# Patient Record
Sex: Female | Born: 1970 | Race: White | Hispanic: No | Marital: Single | Smoking: Current every day smoker
Health system: Southern US, Community
[De-identification: ages and names within clinical notes are randomized; demographics above are authoritative.]

## PROBLEM LIST (undated history)

## (undated) DIAGNOSIS — R569 Unspecified convulsions: Secondary | ICD-10-CM

## (undated) DIAGNOSIS — F419 Anxiety disorder, unspecified: Secondary | ICD-10-CM

## (undated) DIAGNOSIS — N809 Endometriosis, unspecified: Secondary | ICD-10-CM

## (undated) DIAGNOSIS — J449 Chronic obstructive pulmonary disease, unspecified: Secondary | ICD-10-CM

## (undated) DIAGNOSIS — R0600 Dyspnea, unspecified: Secondary | ICD-10-CM

## (undated) HISTORY — PX: CHOLECYSTECTOMY: SHX55

---

## 2004-09-29 ENCOUNTER — Emergency Department: Payer: Self-pay | Admitting: Internal Medicine

## 2004-10-02 ENCOUNTER — Emergency Department (HOSPITAL_COMMUNITY): Admission: EM | Admit: 2004-10-02 | Discharge: 2004-10-03 | Payer: Self-pay | Admitting: Emergency Medicine

## 2004-10-04 ENCOUNTER — Inpatient Hospital Stay (HOSPITAL_COMMUNITY): Admission: EM | Admit: 2004-10-04 | Discharge: 2004-10-06 | Payer: Self-pay | Admitting: Emergency Medicine

## 2004-10-11 ENCOUNTER — Emergency Department: Payer: Self-pay | Admitting: Emergency Medicine

## 2007-08-26 ENCOUNTER — Emergency Department (HOSPITAL_COMMUNITY): Admission: EM | Admit: 2007-08-26 | Discharge: 2007-08-26 | Payer: Self-pay | Admitting: Emergency Medicine

## 2007-08-30 ENCOUNTER — Emergency Department: Payer: Self-pay | Admitting: Emergency Medicine

## 2007-08-31 ENCOUNTER — Emergency Department (HOSPITAL_COMMUNITY): Admission: EM | Admit: 2007-08-31 | Discharge: 2007-08-31 | Payer: Self-pay | Admitting: Emergency Medicine

## 2007-09-01 ENCOUNTER — Encounter (INDEPENDENT_AMBULATORY_CARE_PROVIDER_SITE_OTHER): Payer: Self-pay | Admitting: General Surgery

## 2007-09-01 ENCOUNTER — Inpatient Hospital Stay (HOSPITAL_COMMUNITY): Admission: EM | Admit: 2007-09-01 | Discharge: 2007-09-02 | Payer: Self-pay | Admitting: Emergency Medicine

## 2008-04-11 ENCOUNTER — Emergency Department (HOSPITAL_COMMUNITY): Admission: EM | Admit: 2008-04-11 | Discharge: 2008-04-11 | Payer: Self-pay | Admitting: Emergency Medicine

## 2008-06-28 ENCOUNTER — Emergency Department (HOSPITAL_COMMUNITY): Admission: EM | Admit: 2008-06-28 | Discharge: 2008-06-28 | Payer: Self-pay | Admitting: Emergency Medicine

## 2008-07-04 ENCOUNTER — Emergency Department (HOSPITAL_COMMUNITY): Admission: EM | Admit: 2008-07-04 | Discharge: 2008-07-04 | Payer: Self-pay | Admitting: Emergency Medicine

## 2008-11-30 ENCOUNTER — Emergency Department (HOSPITAL_COMMUNITY): Admission: EM | Admit: 2008-11-30 | Discharge: 2008-12-01 | Payer: Self-pay | Admitting: Emergency Medicine

## 2009-04-18 ENCOUNTER — Emergency Department (HOSPITAL_COMMUNITY): Admission: EM | Admit: 2009-04-18 | Discharge: 2009-04-18 | Payer: Self-pay | Admitting: Emergency Medicine

## 2009-09-27 ENCOUNTER — Emergency Department (HOSPITAL_COMMUNITY): Admission: EM | Admit: 2009-09-27 | Discharge: 2009-09-27 | Payer: Self-pay | Admitting: Emergency Medicine

## 2009-11-05 ENCOUNTER — Inpatient Hospital Stay (HOSPITAL_COMMUNITY): Admission: AD | Admit: 2009-11-05 | Discharge: 2009-11-07 | Payer: Self-pay | Admitting: Obstetrics & Gynecology

## 2009-11-05 ENCOUNTER — Ambulatory Visit: Payer: Self-pay | Admitting: Obstetrics & Gynecology

## 2009-11-05 ENCOUNTER — Encounter: Payer: Self-pay | Admitting: Obstetrics & Gynecology

## 2010-03-09 ENCOUNTER — Encounter: Payer: Self-pay | Admitting: Emergency Medicine

## 2010-03-09 ENCOUNTER — Inpatient Hospital Stay (HOSPITAL_COMMUNITY): Admission: EM | Admit: 2010-03-09 | Discharge: 2010-03-11 | Payer: Self-pay | Admitting: General Surgery

## 2010-03-19 ENCOUNTER — Inpatient Hospital Stay (HOSPITAL_COMMUNITY): Admission: EM | Admit: 2010-03-19 | Discharge: 2010-03-23 | Payer: Self-pay | Admitting: Emergency Medicine

## 2010-03-19 ENCOUNTER — Ambulatory Visit: Payer: Self-pay | Admitting: Internal Medicine

## 2010-03-21 ENCOUNTER — Encounter: Payer: Self-pay | Admitting: Internal Medicine

## 2010-03-21 DIAGNOSIS — F200 Paranoid schizophrenia: Secondary | ICD-10-CM | POA: Insufficient documentation

## 2010-03-21 DIAGNOSIS — F411 Generalized anxiety disorder: Secondary | ICD-10-CM | POA: Insufficient documentation

## 2010-03-21 DIAGNOSIS — F319 Bipolar disorder, unspecified: Secondary | ICD-10-CM | POA: Insufficient documentation

## 2010-03-21 DIAGNOSIS — Z87898 Personal history of other specified conditions: Secondary | ICD-10-CM | POA: Insufficient documentation

## 2010-03-21 DIAGNOSIS — R569 Unspecified convulsions: Secondary | ICD-10-CM

## 2010-03-21 DIAGNOSIS — T148XXA Other injury of unspecified body region, initial encounter: Secondary | ICD-10-CM | POA: Insufficient documentation

## 2010-03-21 DIAGNOSIS — N12 Tubulo-interstitial nephritis, not specified as acute or chronic: Secondary | ICD-10-CM | POA: Insufficient documentation

## 2010-03-21 DIAGNOSIS — J45909 Unspecified asthma, uncomplicated: Secondary | ICD-10-CM | POA: Insufficient documentation

## 2010-03-27 ENCOUNTER — Ambulatory Visit: Payer: Self-pay | Admitting: Internal Medicine

## 2010-03-27 ENCOUNTER — Encounter: Payer: Self-pay | Admitting: Internal Medicine

## 2010-03-27 DIAGNOSIS — R110 Nausea: Secondary | ICD-10-CM | POA: Insufficient documentation

## 2010-03-31 ENCOUNTER — Telehealth: Payer: Self-pay | Admitting: Internal Medicine

## 2010-03-31 ENCOUNTER — Encounter: Payer: Self-pay | Admitting: Internal Medicine
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
## 2010-09-26 NOTE — Progress Notes (Signed)
Summary: phone/gg  Phone Note From Other Clinic   Summary of Call: call from Dionne with [facility]  [phone] reporting culture was positive for  c diff  : collection date  28th of July. Will fax report and scan in chart. Initial call taken by: Merrie Roof RN,  March 31, 2010 9:33 AM  Follow-up for Phone Call        Discussed with Dr. Rogelia Boga.  I will treat patient if she calls or come in complaining of diarrhea.  Her last office visit for hospital follow-up, patient had no complaints about diarrhea, only nausea. Follow-up by: Carrolyn Meiers, MD  Additional Follow-up for Phone Call Additional follow up Details #1::        Phone call completed
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
## 2010-09-26 NOTE — Miscellaneous (Signed)
Summary: PATIENT CONSENT FORM  PATIENT CONSENT FORM   Imported By: Louretta Parma 03/29/2010 15:56:52  _____________________________________________________________________  External Attachment:    Type:   Image     Comment:   External Document

## 2010-11-11 LAB — CARDIAC PANEL(CRET KIN+CKTOT+MB+TROPI)
CK, MB: 1.1 ng/mL (ref 0.3–4.0)
Relative Index: INVALID (ref 0.0–2.5)
Total CK: 28 U/L (ref 7–177)
Troponin I: 0.01 ng/mL (ref 0.00–0.06)
Troponin I: 0.01 ng/mL (ref 0.00–0.06)
Troponin I: 0.07 ng/mL — ABNORMAL HIGH (ref 0.00–0.06)

## 2010-11-11 LAB — CBC
HCT: 33.7 % — ABNORMAL LOW (ref 36.0–46.0)
HCT: 34.7 % — ABNORMAL LOW (ref 36.0–46.0)
HCT: 42 % (ref 36.0–46.0)
Hemoglobin: 11.9 g/dL — ABNORMAL LOW (ref 12.0–15.0)
Hemoglobin: 12.9 g/dL (ref 12.0–15.0)
Hemoglobin: 13.7 g/dL (ref 12.0–15.0)
Hemoglobin: 13.9 g/dL (ref 12.0–15.0)
Hemoglobin: 14.1 g/dL (ref 12.0–15.0)
MCH: 31.3 pg (ref 26.0–34.0)
MCH: 31.6 pg (ref 26.0–34.0)
MCH: 31.6 pg (ref 26.0–34.0)
MCH: 32.3 pg (ref 26.0–34.0)
MCHC: 32.9 g/dL (ref 30.0–36.0)
MCHC: 33.3 g/dL (ref 30.0–36.0)
MCHC: 33.5 g/dL (ref 30.0–36.0)
MCHC: 33.5 g/dL (ref 30.0–36.0)
MCHC: 34.3 g/dL (ref 30.0–36.0)
MCV: 94.1 fL (ref 78.0–100.0)
MCV: 94.9 fL (ref 78.0–100.0)
MCV: 95.4 fL (ref 78.0–100.0)
Platelets: 269 10*3/uL (ref 150–400)
Platelets: 327 10*3/uL (ref 150–400)
Platelets: 412 10*3/uL — ABNORMAL HIGH (ref 150–400)
RBC: 4.39 MIL/uL (ref 3.87–5.11)
RBC: 4.43 MIL/uL (ref 3.87–5.11)
RDW: 16.7 % — ABNORMAL HIGH (ref 11.5–15.5)
RDW: 17.2 % — ABNORMAL HIGH (ref 11.5–15.5)
RDW: 17.7 % — ABNORMAL HIGH (ref 11.5–15.5)
WBC: 15.7 10*3/uL — ABNORMAL HIGH (ref 4.0–10.5)
WBC: 8.4 10*3/uL (ref 4.0–10.5)

## 2010-11-11 LAB — LIPASE, BLOOD: Lipase: 16 U/L (ref 11–59)

## 2010-11-11 LAB — POCT PREGNANCY, URINE: Preg Test, Ur: NEGATIVE

## 2010-11-11 LAB — URINE MICROSCOPIC-ADD ON

## 2010-11-11 LAB — MRSA PCR SCREENING: MRSA by PCR: NEGATIVE

## 2010-11-11 LAB — PHOSPHORUS: Phosphorus: 3.2 mg/dL (ref 2.3–4.6)

## 2010-11-11 LAB — CK TOTAL AND CKMB (NOT AT ARMC)
Relative Index: INVALID (ref 0.0–2.5)
Total CK: 47 U/L (ref 7–177)

## 2010-11-11 LAB — URINALYSIS, ROUTINE W REFLEX MICROSCOPIC
Ketones, ur: NEGATIVE mg/dL
Protein, ur: 100 mg/dL — AB
Urobilinogen, UA: 1 mg/dL (ref 0.0–1.0)

## 2010-11-11 LAB — BASIC METABOLIC PANEL
BUN: 2 mg/dL — ABNORMAL LOW (ref 6–23)
BUN: 3 mg/dL — ABNORMAL LOW (ref 6–23)
BUN: 3 mg/dL — ABNORMAL LOW (ref 6–23)
CO2: 27 mEq/L (ref 19–32)
CO2: 28 mEq/L (ref 19–32)
CO2: 30 mEq/L (ref 19–32)
Calcium: 7.4 mg/dL — ABNORMAL LOW (ref 8.4–10.5)
Calcium: 8.4 mg/dL (ref 8.4–10.5)
Calcium: 8.6 mg/dL (ref 8.4–10.5)
Calcium: 9.2 mg/dL (ref 8.4–10.5)
Chloride: 103 mEq/L (ref 96–112)
Creatinine, Ser: 0.55 mg/dL (ref 0.4–1.2)
Creatinine, Ser: 0.64 mg/dL (ref 0.4–1.2)
Creatinine, Ser: 0.69 mg/dL (ref 0.4–1.2)
GFR calc Af Amer: >60 mL/min (ref >60)
GFR calc Af Amer: >60 mL/min (ref >60)
GFR calc non Af Amer: >60 mL/min (ref >60)
GFR calc non Af Amer: >60 mL/min (ref >60)
GFR calc non Af Amer: >60 mL/min (ref >60)
GFR calc non Af Amer: >60 mL/min (ref >60)
Glucose, Bld: 105 mg/dL — ABNORMAL HIGH (ref 70–99)
Glucose, Bld: 122 mg/dL — ABNORMAL HIGH (ref 70–99)
Glucose, Bld: 134 mg/dL — ABNORMAL HIGH (ref 70–99)
Glucose, Bld: 98 mg/dL (ref 70–99)
Potassium: 4.2 mEq/L (ref 3.5–5.1)
Sodium: 138 mEq/L (ref 135–145)
Sodium: 140 mEq/L (ref 135–145)

## 2010-11-11 LAB — RAPID URINE DRUG SCREEN, HOSP PERFORMED
Amphetamines: NOT DETECTED
Barbiturates: NOT DETECTED
Benzodiazepines: NOT DETECTED
Opiates: NOT DETECTED
Tetrahydrocannabinol: POSITIVE — AB

## 2010-11-11 LAB — DIFFERENTIAL
Basophils Relative: 0 % (ref 0–1)
Basophils Relative: 0 % (ref 0–1)
Eosinophils Absolute: 0 10*3/uL (ref 0.0–0.7)
Eosinophils Absolute: 0 10*3/uL (ref 0.0–0.7)
Eosinophils Relative: 0 % (ref 0–5)
Eosinophils Relative: 0 % (ref 0–5)
Monocytes Absolute: 1.6 10*3/uL — ABNORMAL HIGH (ref 0.1–1.0)
Monocytes Relative: 13 % — ABNORMAL HIGH (ref 3–12)
Neutrophils Relative %: 85 % — ABNORMAL HIGH (ref 43–77)

## 2010-11-11 LAB — COMPREHENSIVE METABOLIC PANEL
ALT: 22 U/L (ref 0–35)
AST: 26 U/L (ref 0–37)
Alkaline Phosphatase: 108 U/L (ref 39–117)
CO2: 27 mEq/L (ref 19–32)
Chloride: 99 mEq/L (ref 96–112)
GFR calc non Af Amer: >60 mL/min (ref >60)
Glucose, Bld: 122 mg/dL — ABNORMAL HIGH (ref 70–99)
Potassium: 3.2 mEq/L — ABNORMAL LOW (ref 3.5–5.1)
Sodium: 134 mEq/L — ABNORMAL LOW (ref 135–145)

## 2010-11-11 LAB — LIPID PANEL
Triglycerides: 59 mg/dL (ref <150)
VLDL: 12 mg/dL (ref 0–40)

## 2010-11-11 LAB — CULTURE, BLOOD (ROUTINE X 2): Culture: NO GROWTH

## 2010-11-11 LAB — HEMOGLOBIN A1C
Hgb A1c MFr Bld: 5.4 % (ref <5.7)
Mean Plasma Glucose: 108 mg/dL (ref <117)

## 2010-11-11 LAB — RETICULOCYTES
Retic Count, Absolute: 18.2 10*3/uL — ABNORMAL LOW (ref 19.0–186.0)
Retic Ct Pct: 0.5 % (ref 0.4–3.1)

## 2010-11-11 LAB — CLOSTRIDIUM DIFFICILE EIA

## 2010-11-11 LAB — URINE CULTURE: Culture  Setup Time: 201107250001

## 2010-11-11 LAB — FOLATE: Folate: 8 ng/mL

## 2010-11-11 LAB — TSH: TSH: 3.798 u[IU]/mL (ref 0.350–4.500)

## 2010-11-11 LAB — ETHANOL: Alcohol, Ethyl (B): <5 mg/dL (ref 0–10)

## 2010-11-11 LAB — HEMOCCULT GUIAC POC 1CARD (OFFICE): Fecal Occult Bld: NEGATIVE

## 2010-11-11 LAB — IRON AND TIBC

## 2010-11-11 LAB — MAGNESIUM: Magnesium: 2 mg/dL (ref 1.5–2.5)

## 2010-11-12 LAB — COMPREHENSIVE METABOLIC PANEL
AST: 34 U/L (ref 0–37)
Albumin: 4.1 g/dL (ref 3.5–5.2)
Alkaline Phosphatase: 90 U/L (ref 39–117)
Chloride: 111 mEq/L (ref 96–112)
GFR calc Af Amer: >60 mL/min (ref >60)
Potassium: 3.4 mEq/L — ABNORMAL LOW (ref 3.5–5.1)
Sodium: 143 mEq/L (ref 135–145)
Total Bilirubin: 0.8 mg/dL (ref 0.3–1.2)
Total Protein: 7.2 g/dL (ref 6.0–8.3)

## 2010-11-12 LAB — DIFFERENTIAL
Basophils Absolute: 0.1 10*3/uL (ref 0.0–0.1)
Basophils Relative: 0 % (ref 0–1)
Eosinophils Relative: 0 % (ref 0–5)
Monocytes Absolute: 1.3 10*3/uL — ABNORMAL HIGH (ref 0.1–1.0)
Monocytes Relative: 6 % (ref 3–12)

## 2010-11-12 LAB — URINE MICROSCOPIC-ADD ON

## 2010-11-12 LAB — URINALYSIS, ROUTINE W REFLEX MICROSCOPIC
Bilirubin Urine: NEGATIVE
Nitrite: NEGATIVE
Specific Gravity, Urine: 1.009 (ref 1.005–1.030)
pH: 5 (ref 5.0–8.0)

## 2010-11-12 LAB — MRSA PCR SCREENING: MRSA by PCR: NEGATIVE

## 2010-11-12 LAB — RAPID URINE DRUG SCREEN, HOSP PERFORMED
Opiates: NOT DETECTED
Tetrahydrocannabinol: POSITIVE — AB

## 2010-11-12 LAB — CBC
Platelets: 305 10*3/uL (ref 150–400)
RBC: 4.72 MIL/uL (ref 3.87–5.11)
WBC: 21.3 10*3/uL — ABNORMAL HIGH (ref 4.0–10.5)

## 2010-11-12 LAB — ETHANOL: Alcohol, Ethyl (B): 239 mg/dL — ABNORMAL HIGH (ref 0–10)

## 2010-11-12 LAB — ACETAMINOPHEN LEVEL: Acetaminophen (Tylenol), Serum: <10 ug/mL — ABNORMAL LOW (ref 10–30)

## 2010-11-17 LAB — DIFFERENTIAL
Basophils Absolute: 0.1 10*3/uL (ref 0.0–0.1)
Basophils Relative: 2 % — ABNORMAL HIGH (ref 0–1)
Eosinophils Absolute: 0 10*3/uL (ref 0.0–0.7)
Eosinophils Relative: 1 % (ref 0–5)
Lymphs Abs: 1.5 10*3/uL (ref 0.7–4.0)
Neutrophils Relative %: 75 % (ref 43–77)

## 2010-11-17 LAB — CBC
HCT: 43.9 % (ref 36.0–46.0)
MCHC: 34.9 g/dL (ref 30.0–36.0)
MCV: 98.4 fL (ref 78.0–100.0)
Platelets: 366 10*3/uL (ref 150–400)
RDW: 12.8 % (ref 11.5–15.5)
WBC: 9 10*3/uL (ref 4.0–10.5)

## 2010-11-17 LAB — GC/CHLAMYDIA PROBE AMP, GENITAL: GC Probe Amp, Genital: NEGATIVE

## 2010-11-17 LAB — BASIC METABOLIC PANEL
BUN: 6 mg/dL (ref 6–23)
Chloride: 100 mEq/L (ref 96–112)
Creatinine, Ser: 0.5 mg/dL (ref 0.4–1.2)
Glucose, Bld: 97 mg/dL (ref 70–99)
Potassium: 4.1 mEq/L (ref 3.5–5.1)

## 2010-11-17 LAB — URINALYSIS, ROUTINE W REFLEX MICROSCOPIC
Glucose, UA: NEGATIVE mg/dL
Hgb urine dipstick: NEGATIVE
Ketones, ur: NEGATIVE mg/dL
Protein, ur: NEGATIVE mg/dL
pH: 6.5 (ref 5.0–8.0)

## 2010-11-17 LAB — WET PREP, GENITAL
Trich, Wet Prep: NONE SEEN
Yeast Wet Prep HPF POC: NONE SEEN

## 2010-11-17 LAB — ABO/RH: ABO/RH(D): O POS

## 2010-11-17 LAB — HCG, QUANTITATIVE, PREGNANCY: hCG, Beta Chain, Quant, S: 124690 m[IU]/mL — ABNORMAL HIGH (ref <5)

## 2010-11-20 LAB — CBC
HCT: 15 % — ABNORMAL LOW (ref 36.0–46.0)
HCT: 18.9 % — ABNORMAL LOW (ref 36.0–46.0)
HCT: 24.5 % — ABNORMAL LOW (ref 36.0–46.0)
Hemoglobin: 5.1 g/dL — CL (ref 12.0–15.0)
Hemoglobin: 6.3 g/dL — CL (ref 12.0–15.0)
Hemoglobin: 8.4 g/dL — ABNORMAL LOW (ref 12.0–15.0)
MCHC: 33.7 g/dL (ref 30.0–36.0)
MCHC: 33.8 g/dL (ref 30.0–36.0)
MCHC: 34.1 g/dL (ref 30.0–36.0)
MCV: 100.2 fL — ABNORMAL HIGH (ref 78.0–100.0)
Platelets: 160 10*3/uL (ref 150–400)
Platelets: 185 10*3/uL (ref 150–400)
Platelets: 245 10*3/uL (ref 150–400)
RBC: 1.88 MIL/uL — ABNORMAL LOW (ref 3.87–5.11)
RBC: 2.64 MIL/uL — ABNORMAL LOW (ref 3.87–5.11)
RDW: 12.9 % (ref 11.5–15.5)
RDW: 13.1 % (ref 11.5–15.5)
RDW: 19.1 % — ABNORMAL HIGH (ref 11.5–15.5)
WBC: 17.5 10*3/uL — ABNORMAL HIGH (ref 4.0–10.5)
WBC: 20.2 10*3/uL — ABNORMAL HIGH (ref 4.0–10.5)

## 2010-11-20 LAB — CROSSMATCH
ABO/RH(D): O POS
Antibody Screen: NEGATIVE

## 2010-11-20 LAB — WET PREP, GENITAL

## 2010-11-20 LAB — ABO/RH: ABO/RH(D): O POS

## 2010-11-20 LAB — SAMPLE TO BLOOD BANK

## 2010-12-02 LAB — HEMOCCULT GUIAC POC 1CARD (OFFICE): Fecal Occult Bld: NEGATIVE

## 2010-12-02 LAB — CBC
Platelets: 510 10*3/uL — ABNORMAL HIGH (ref 150–400)
RDW: 14.2 % (ref 11.5–15.5)

## 2010-12-02 LAB — POCT I-STAT, CHEM 8
BUN: 6 mg/dL (ref 6–23)
Chloride: 103 mEq/L (ref 96–112)
Sodium: 140 mEq/L (ref 135–145)
TCO2: 31 mmol/L (ref 0–100)

## 2010-12-02 LAB — DIFFERENTIAL
Basophils Absolute: 0.1 10*3/uL (ref 0.0–0.1)
Lymphocytes Relative: 28 % (ref 12–46)
Neutro Abs: 4.6 10*3/uL (ref 1.7–7.7)
Neutrophils Relative %: 61 % (ref 43–77)

## 2010-12-02 LAB — URINALYSIS, ROUTINE W REFLEX MICROSCOPIC
Glucose, UA: NEGATIVE mg/dL
Leukocytes, UA: NEGATIVE
Protein, ur: NEGATIVE mg/dL
Urobilinogen, UA: 1 mg/dL (ref 0.0–1.0)

## 2010-12-02 LAB — URINE MICROSCOPIC-ADD ON

## 2010-12-02 LAB — WET PREP, GENITAL: Trich, Wet Prep: NONE SEEN

## 2010-12-02 LAB — POCT PREGNANCY, URINE: Preg Test, Ur: NEGATIVE

## 2010-12-06 LAB — URINALYSIS, ROUTINE W REFLEX MICROSCOPIC
Hgb urine dipstick: NEGATIVE
Specific Gravity, Urine: 1.016 (ref 1.005–1.030)
Urobilinogen, UA: 1 mg/dL (ref 0.0–1.0)

## 2010-12-06 LAB — POCT I-STAT, CHEM 8
BUN: <3 mg/dL — ABNORMAL LOW (ref 6–23)
Creatinine, Ser: 0.6 mg/dL (ref 0.4–1.2)
Hemoglobin: 10.5 g/dL — ABNORMAL LOW (ref 12.0–15.0)
Potassium: 2.9 mEq/L — ABNORMAL LOW (ref 3.5–5.1)
Sodium: 140 mEq/L (ref 135–145)

## 2010-12-06 LAB — RAPID URINE DRUG SCREEN, HOSP PERFORMED
Amphetamines: NOT DETECTED
Barbiturates: NOT DETECTED
Opiates: NOT DETECTED

## 2011-01-09 NOTE — Op Note (Signed)
NAMEALAIAH, LUNDY NO.:  [id number]   MEDICAL RECORD NO.:  [id number]          PATIENT TYPE:  INP   LOCATION:  5039                         FACILITY:  [facility]   PHYSICIAN:  Adolph Pollack, M.D.DATE OF BIRTH:  [birth year]   DATE OF PROCEDURE:  09/01/2007  DATE OF DISCHARGE:                               OPERATIVE REPORT   PREOPERATIVE DIAGNOSIS:  Acute cholecystitis.   POSTOPERATIVE DIAGNOSIS:  Acute cholecystitis.   PROCEDURE:  Laparoscopic cholecystectomy with intraoperative  cholangiogram.   SURGEON:  Adolph Pollack, M.D.   ASSISTANT:  Ollen Gross. Vernell Morgans, M.D.   ANESTHESIA:  General.   INDICATIONS:  This is a 40 year old female who been having intermittent  crampy right upper quadrant pain mostly after meals for the past month.  She presented to the emergency department first on Saturday night and  then returned Sunday with the progressively increasing a persistent  pain.  She had ultrasounds demonstrating gallstones and clinical  findings consistent with acute cholecystitis.  She is now brought to the  operating room for laparoscopic possible open cholecystectomy.  Procedure and risks were discussed with her preoperatively.   TECHNIQUE:  She is brought to the operating room, placed on the  operating table and general anesthetic was administered.  Abdominal wall  was sterilely prepped and draped.  Local anesthetic was infiltrated in  the subumbilical region.  A previous small subumbilical incision was  reincised through skin, subcutaneous tissue and both layers of fascia  and peritoneum until the peritoneal cavity was entered under direct  vision.  A pursestring suture of 0 Vicryl was placed around the fascial  edges.  A Hasson trocar was introduced into the peritoneal cavity and  pneumoperitoneum was created by insufflation of CO2 gas.   Next a laparoscope was introduced.  Small bowel free fluid was seen in  the pelvis and around the right  upper quadrant area.  She is placed in  reverse Trendelenburg position, right side tilted up.  An 11-mm trocar  was placed in the epigastric incision and two 5 mm trocars were placed  in a right mid lateral abdomen.  The fundus of the gallbladder was  grasped.  There is some early acute inflammatory changes and edema noted  around the gallbladder.  The fundus was retracted to the right shoulder.  The infundibulum was grasped using careful blunt dissection, staying on  the gallbladder, the infundibulum was mobilized.  I then created a  window around what appeared to be infundibulum cystic duct and created a  window around the cystic artery.  I clipped the cystic artery and  divided it and dissected some of the gallbladder free from liver giving  a very larger window around the cystic duct and gallbladder.  The cystic  duct appeared dilated.  I placed a clip at the cystic duct gallbladder  junction and made an incision in the cystic duct and evacuated some  bile.  I then placed a cholangiocatheter through the anterior abdominal  wall to the cystic duct.  I irrigated this with saline but there  appeared to be something obstructing with  the flow of this near the  cystic duct common hepatic duct junction.  I carefully noted a very firm  stone in the cystic duct.  I was able to carefully crush this.  I then  milked part of the stone back through the cystic duct opening.  I then  took a Reddick catheter and inserted it past the area of the stone.  The  balloon was then inflated and I pulled the catheter back evacuating more  stone debris.  I did this for two more passes and at the last pass no  debris was noted.  Following this I was able to put the  cholangiocatheter in and inject saline freely without difficulty.   Under fluoroscopic guidance I then injected dilute contrast into the  cystic duct which was of long length.  The common hepatic, right and  left hepatic, common bile ducts all  filled and the contrast drained to  the duodenum.  I did not see any obvious evidence of obstruction.  Final  reports pending the radiologist interpretation.   Following this the cholangiocatheter was removed.  I then clipped the  cystic duct twice and divided it.  I added two Endoloops to the cystic  duct remnant as well.  The gallbladder was then dissected free from  liver and put in an Endopouch bag.   The gallbladder fossa was copiously irrigated and bleeding points  controlled with electrocautery.  Upon further inspection no further  bleeding was noted.  No bile leak was noted.  I then evacuated as much  irrigation fluid as possible.   The gallbladder was then removed through the subumbilical port and the  subumbilical fascial defect was closed under laparoscopic vision by  tightening up and tying down the pursestring suture.  The remaining  trocars were removed and pneumoperitoneum was released.  The skin  incisions were then closed with 4-0 Monocryl subcuticular stitches  followed by Steri-Strips and sterile dressings.   She tolerated procedure well without apparent complications and was  taken to recovery room in satisfactory condition.      Adolph Pollack, M.D.  Electronically Signed     TJR/MEDQ  D:  09/01/2007  T:  09/01/2007  Job:  [id number]
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
## 2011-01-12 NOTE — Discharge Summary (Signed)
NAMECHARENE, MCCALLISTER NO.:  [id number]   MEDICAL RECORD NO.:  [id number]          PATIENT TYPE:  INP   LOCATION:  5039                         FACILITY:  [facility]   PHYSICIAN:  Adolph Pollack, M.D.DATE OF BIRTH:  1971-07-17   DATE OF ADMISSION:  08/31/2007  DATE OF DISCHARGE:  09/02/2007                               DISCHARGE SUMMARY   PRINCIPAL DISCHARGE DIAGNOSES:  Chronic cholecystitis, cholelithiasis.   SECONDARY DIAGNOSES:  1. Seizure disorder.  2. Bipolar disorder.  3. Asthma.  4. Endometriosis.   PROCEDURE:  Laparoscopic cholecystectomy with intraoperative  cholangiogram.   REASON FOR ADMISSION:  This 40 year old female was admitted with a month  history of intermittent right upper quadrant pain and was initially seen  at [facility] a couple of nights before admission, was found to have  gallstones, was given a referral to the office but presented again with  persistent pain and was admitted.   HOSPITAL COURSE:  She was admitted, taken the operating room for  cholecystitis.  She underwent a laparoscopic cholecystectomy which she  tolerated well and was able to be discharged home on the first  postoperative day.   DISPOSITION:  Discharged home in satisfactory condition September 02, 2007.  She was given discharge instructions and medication for pain.  She will  follow up in the office in 2-3 weeks.      Adolph Pollack, M.D.  Electronically Signed     TJR/MEDQ  D:  09/09/2007  T:  09/09/2007  Job:  [id number]

## 2011-01-12 NOTE — Discharge Summary (Signed)
Stacy Bolton, Stacy Bolton NO.:  [id number]   MEDICAL RECORD NO.:  [id number]          PATIENT TYPE:  INP   LOCATION:  3031                         FACILITY:  [facility]   PHYSICIAN:  Suzanna Obey, M.D.       DATE OF BIRTH:  1970/09/03   DATE OF ADMISSION:  10/04/2004  DATE OF DISCHARGE:  10/06/2004                                 DISCHARGE SUMMARY   ADMISSION DIAGNOSIS:  Submandibular access.   DISCHARGE DIAGNOSIS:  Submandibular access.   SURGICAL PROCEDURE:  Incision and drainage of left neck abscess.   HOSPITAL COURSE:  This is a 40 year old who was admitted for a large mass in  the left submandibular area that was increased in swelling, and she was  having an excessive amount of pain.  It was fluctuant.  She was taken to the  operating room on October 04, 2004, and she had an incision and drainage of  the mass, with a significant amount of purulent material that was expressed.  A drain was placed.  She was doing well, and nursing had called about 6:00  in the morning with some respiratory difficulty that she was having with  swelling of the tongue, and she was examined, with some watery swelling on  the floor of mouth of just under her tongue and long the left side, but  there was no posterior airway obstruction.  By fiberoptic exam, the tongue,  epiglottis, and glottis were all normal.  The drain was intact.  She was  continued on vancomycin and Zosyn.  She was given some Afrin for her nasal  congestion.  She remained afebrile on postop day one, and the swelling in  her mouth had substantially decreased in size to almost resolve.  There was  still a significant amount of erythema and induration along the left neck.  The wound on her chin looked better.  Some arrangements were made for her to  go home on antibiotics.  She was not willing to stay for continued treatment  with the intravenous antibiotics.  Even though arrangements were attempted  to be made, she left  against medical advice because she had to go care for  her dogs.  Dr. Lazarus Salines was paged and made aware that the patient had left.  A notation was made.  The patient's follow-up is unknown, as she did not  leave any word what her plans were.      JB/MEDQ  D:  01/19/2005  T:  01/19/2005  Job:  [id number]

## 2011-01-12 NOTE — Op Note (Signed)
NAMEDORTHEA, MAINA NO.:  [id number]   MEDICAL RECORD NO.:  [id number]          PATIENT TYPE:  INP   LOCATION:  3031                         FACILITY:  [facility]   PHYSICIAN:  Suzanna Obey, M.D.       DATE OF BIRTH:  09-27-1970   DATE OF PROCEDURE:  10/04/2004  DATE OF DISCHARGE:                                 OPERATIVE REPORT   PREOPERATIVE DIAGNOSIS:  Left neck abscess.   POSTOPERATIVE DIAGNOSIS:  Left neck abscess.   OPERATION PERFORMED:  Incision and drainage of left neck abscess.   SURGEON:  Suzanna Obey, M.D.   ANESTHESIA:  General endotracheal tube.   ESTIMATED BLOOD LOSS:  Approximately 10 mL.   INDICATIONS FOR PROCEDURE:  The patient is a 40 year old who has had an  enlarging lesion on her chin which spontaneously ruptured and then she began  developing swelling in her left neck.  This has been going on for multiple  days.  She was seen in the emergency room, underwent a CT scan which  suggested the area did have some early abscess.  She left AMA.  She then  returned today with worsening swelling, erythema and pain.  A CT scan was  again performed and there was a definite large collection of fluid.  She has  no stridor or airway problems. She was informed of the risks and benefits of  the procedure including bleeding, infection, scarring, injury to the  marginal mandibular nerve, recurrence, airway obstruction, and risks of the  anesthetic.  All questions were answered and consent was obtained.  She had  a weakness of her left marginal mandibular nerve with the lower lip before  the case started.  This is presumed to be from the infection.   DESCRIPTION OF PROCEDURE:  The patient was taken to the operating room and  placed in supine position.  After adequate general endotracheal tube  anesthesia, she was placed in the supine position, prepped and draped in the  usual sterile manner.  An incision was made over the epicenter of the  abscess which was  mostly in the submandibular region and this was done with  the 15 blade.  Then blunt dissection was used with a hemostat to enter the  large amount of pus that was immediately encountered.  There was a  significant amount of purulent material that extended over toward the  sternocleidomastoid muscle and in the submandibular triangle.  It extended  up into the submental area and along the chin where the originating lesion  was located.  This was all opened up with finger dissection.  Once this was  opened, cultures were sent of the purulent material.  Irrigation was used to  irrigate the entire wound.  The chin wound was explored with a hemostat and  there was no purulent material remaining in it.  A  Penrose drain was placed laterally and superiorly and secured with a 3-0  nylon.  The patient was then examined with the straight blade and there was  not any obvious significant airway swelling.  The patient was then  awakened  and brought to recovery room in stable condition, counts correct.      JB/MEDQ  D:  10/04/2004  T:  10/05/2004  Job:  [id number]

## 2011-01-23 ENCOUNTER — Encounter: Payer: Self-pay | Admitting: Internal Medicine

## 2011-05-16 LAB — DIFFERENTIAL
Basophils Absolute: 0.1
Basophils Absolute: 0.1
Basophils Relative: 1
Basophils Relative: 1
Eosinophils Absolute: 0.2
Eosinophils Relative: 2
Monocytes Absolute: 0.7
Monocytes Relative: 8
Neutro Abs: 4
Neutro Abs: 5.9
Neutrophils Relative %: 50

## 2011-05-16 LAB — URINALYSIS, ROUTINE W REFLEX MICROSCOPIC
Glucose, UA: NEGATIVE
Ketones, ur: 15 — AB
Specific Gravity, Urine: 1.038 — ABNORMAL HIGH
pH: 6

## 2011-05-16 LAB — COMPREHENSIVE METABOLIC PANEL
AST: 18
AST: 19
Albumin: 2.8 — ABNORMAL LOW
Albumin: 3.1 — ABNORMAL LOW
Albumin: 3.3 — ABNORMAL LOW
Alkaline Phosphatase: 87
Alkaline Phosphatase: 90
BUN: 6
CO2: 26
CO2: 26
Calcium: 7.9 — ABNORMAL LOW
Chloride: 105
Chloride: 106
Chloride: 107
Creatinine, Ser: 0.72
GFR calc Af Amer: >60
GFR calc Af Amer: >60
GFR calc Af Amer: >60
GFR calc non Af Amer: >60
Potassium: 3.3 — ABNORMAL LOW
Potassium: 3.4 — ABNORMAL LOW
Potassium: 3.6
Sodium: 134 — ABNORMAL LOW
Sodium: 140
Total Bilirubin: 0.3
Total Bilirubin: 0.5
Total Bilirubin: 0.5
Total Protein: 4.6 — ABNORMAL LOW
Total Protein: 5.2 — ABNORMAL LOW

## 2011-05-16 LAB — CBC
HCT: 41.1
Hemoglobin: 11.9 — ABNORMAL LOW
Hemoglobin: 13.9
MCHC: 33.8
MCHC: 34.2
MCV: 97.9
Platelets: 263
RBC: 3.58 — ABNORMAL LOW
RBC: 4.17
WBC: 8.9
WBC: 9.5

## 2011-05-16 LAB — PROTIME-INR: INR: 1

## 2011-05-29 LAB — URINALYSIS, ROUTINE W REFLEX MICROSCOPIC
Bilirubin Urine: NEGATIVE
Glucose, UA: NEGATIVE
Glucose, UA: NEGATIVE
Ketones, ur: NEGATIVE
Nitrite: NEGATIVE
Nitrite: NEGATIVE
Protein, ur: NEGATIVE
Protein, ur: NEGATIVE
pH: 7
pH: 7

## 2011-05-29 LAB — DIFFERENTIAL
Basophils Absolute: 0
Basophils Relative: 0
Eosinophils Absolute: 0
Eosinophils Relative: 0
Eosinophils Relative: 1
Lymphocytes Relative: 17
Lymphs Abs: 1.5
Monocytes Absolute: 0.5
Monocytes Absolute: 0.9
Monocytes Relative: 6
Monocytes Relative: 7
Neutro Abs: 9.1 — ABNORMAL HIGH

## 2011-05-29 LAB — POCT I-STAT, CHEM 8
Calcium, Ion: 1.15
Chloride: 102
Glucose, Bld: 81
HCT: 42
TCO2: 22

## 2011-05-29 LAB — CBC
HCT: 39.4
HCT: 39.8
Hemoglobin: 13.5
Hemoglobin: 13.6
MCHC: 34.3
MCV: 98
MCV: 99.2
Platelets: 310
RBC: 4.06
RDW: 12.9
WBC: 8.6

## 2011-05-29 LAB — RPR: RPR Ser Ql: NONREACTIVE

## 2011-05-29 LAB — WET PREP, GENITAL
WBC, Wet Prep HPF POC: NONE SEEN
Yeast Wet Prep HPF POC: NONE SEEN

## 2011-05-29 LAB — POCT PREGNANCY, URINE: Preg Test, Ur: POSITIVE

## 2011-05-29 LAB — HCG, QUANTITATIVE, PREGNANCY: hCG, Beta Chain, Quant, S: 90490 — ABNORMAL HIGH

## 2011-06-01 LAB — COMPREHENSIVE METABOLIC PANEL
Albumin: 3.7
BUN: 6
Calcium: 9
Glucose, Bld: 91
Total Protein: 6.4

## 2011-06-01 LAB — CBC
HCT: 46.1 — ABNORMAL HIGH
Hemoglobin: 15.5 — ABNORMAL HIGH
MCHC: 33.6
MCV: 97.7
Platelets: 339
RBC: 4.72
RDW: 13.7
WBC: 11.8 — ABNORMAL HIGH

## 2011-06-01 LAB — URINALYSIS, ROUTINE W REFLEX MICROSCOPIC
Glucose, UA: NEGATIVE
Hgb urine dipstick: NEGATIVE
Protein, ur: NEGATIVE
Specific Gravity, Urine: 1.026

## 2011-06-01 LAB — DIFFERENTIAL
Lymphocytes Relative: 28
Lymphs Abs: 3.3
Monocytes Absolute: 0.9
Monocytes Relative: 8
Neutro Abs: 7.4
Neutrophils Relative %: 63

## 2011-10-21 ENCOUNTER — Emergency Department (HOSPITAL_COMMUNITY)
Admission: EM | Admit: 2011-10-21 | Discharge: 2011-10-22 | Disposition: A | Discharge status: Home / Self Care | Payer: Self-pay | Attending: Emergency Medicine | Admitting: Emergency Medicine

## 2011-10-21 ENCOUNTER — Encounter (HOSPITAL_COMMUNITY): Payer: Self-pay | Admitting: Emergency Medicine

## 2011-10-21 DIAGNOSIS — M19049 Primary osteoarthritis, unspecified hand: Secondary | ICD-10-CM | POA: Insufficient documentation

## 2011-10-21 DIAGNOSIS — M25549 Pain in joints of unspecified hand: Secondary | ICD-10-CM | POA: Insufficient documentation

## 2011-10-21 DIAGNOSIS — M1990 Unspecified osteoarthritis, unspecified site: Secondary | ICD-10-CM

## 2011-10-21 DIAGNOSIS — J45909 Unspecified asthma, uncomplicated: Secondary | ICD-10-CM | POA: Insufficient documentation

## 2011-10-21 DIAGNOSIS — M7989 Other specified soft tissue disorders: Secondary | ICD-10-CM | POA: Insufficient documentation

## 2011-10-21 DIAGNOSIS — M79609 Pain in unspecified limb: Secondary | ICD-10-CM | POA: Insufficient documentation

## 2011-10-21 NOTE — ED Notes (Signed)
The pt has had rt hand numbness for one week.  She has numbness now and she says she is unable  To grip with her fingers

## 2011-10-22 ENCOUNTER — Emergency Department (HOSPITAL_COMMUNITY): Payer: Self-pay

## 2011-10-22 LAB — SEDIMENTATION RATE: Sed Rate: 3 mm/hr (ref 0–22)

## 2011-10-22 LAB — URIC ACID: Uric Acid, Serum: 3.5 mg/dL (ref 2.4–7.0)

## 2011-10-22 MED ORDER — KETOROLAC TROMETHAMINE 30 MG/ML IJ SOLN
30.0000 mg | Freq: Once | INTRAMUSCULAR | Status: AC
  Administered 2011-10-22: 30 mg via INTRAMUSCULAR
  Filled 2011-10-22: qty 1

## 2011-10-22 MED ORDER — INDOMETHACIN 25 MG PO CAPS: 25.0000 mg | ORAL_CAPSULE | Freq: Three times a day (TID) | ORAL | Status: AC | PRN

## 2011-10-22 MED ORDER — TRAMADOL HCL 50 MG PO TABS
50.0000 mg | ORAL_TABLET | Freq: Once | ORAL | Status: AC
  Administered 2011-10-22: 50 mg via ORAL
  Filled 2011-10-22: qty 1

## 2011-10-22 NOTE — Discharge Instructions (Signed)
You were seen and evaluated today for complaints of pain in your joints and hands. At this time your lab tests and x-rays have not shown any concerning or emergent cause your symptoms. At this time your providers feel your symptoms are caused from arthritis type symptoms. You have been given prescriptions for medicines to help with your symptoms. Use rest and ice to also help reduce inflammation and swelling. Please followup with your primary care provider for continued evaluation and treatment.   Arthritis, Nonspecific Arthritis is inflammation of a joint. This usually means pain, redness, warmth or swelling are present. One or more joints may be involved. There are a number of types of arthritis. Your caregiver may not be able to tell what type of arthritis you have right away. CAUSES  The most common cause of arthritis is the wear and tear on the joint (osteoarthritis). This causes damage to the cartilage, which can break down over time. The knees, hips, back and neck are most often affected by this type of arthritis. Other types of arthritis and common causes of joint pain include:  Sprains and other injuries near the joint. Sometimes minor sprains and injuries cause pain and swelling that develop hours later.   Rheumatoid arthritis. This affects hands, feet and knees. It usually affects both sides of your body at the same time. It is often associated with chronic ailments, fever, weight loss and general weakness.   Crystal arthritis. Gout and pseudo gout can cause occasional acute severe pain, redness and swelling in the foot, ankle, or knee.   Infectious arthritis. Bacteria can get into a joint through a break in overlying skin. This can cause infection of the joint. Bacteria and viruses can also spread through the blood and affect your joints.   Drug, infectious and allergy reactions. Sometimes joints can become mildly painful and slightly swollen with these types of illnesses.  SYMPTOMS    Pain is the main symptom.   Your joint or joints can also be red, swollen and warm or hot to the touch.   You may have a fever with certain types of arthritis, or even feel overall ill.   The joint with arthritis will hurt with movement. Stiffness is present with some types of arthritis.  DIAGNOSIS  Your caregiver will suspect arthritis based on your description of your symptoms and on your exam. Testing may be needed to find the type of arthritis:  Blood and sometimes urine tests.   X-ray tests and sometimes CT or MRI scans.   Removal of fluid from the joint (arthrocentesis) is done to check for bacteria, crystals or other causes. Your caregiver (or a specialist) will numb the area over the joint with a local anesthetic, and use a needle to remove joint fluid for examination. This procedure is only minimally uncomfortable.   Even with these tests, your caregiver may not be able to tell what kind of arthritis you have. Consultation with a specialist (rheumatologist) may be helpful.  TREATMENT  Your caregiver will discuss with you treatment specific to your type of arthritis. If the specific type cannot be determined, then the following general recommendations may apply. Treatment of severe joint pain includes:  Rest.   Elevation.   Anti-inflammatory medication (for example, ibuprofen) may be prescribed. Avoiding activities that cause increased pain.   Only take over-the-counter or prescription medicines for pain and discomfort as recommended by your caregiver.   Cold packs over an inflamed joint may be used for 10 to 15  minutes every hour. Hot packs sometimes feel better, but do not use overnight. Do not use hot packs if you are diabetic without your caregiver's permission.   A cortisone shot into arthritic joints may help reduce pain and swelling.   Any acute arthritis that gets worse over the next 1 to 2 days needs to be looked at to be sure there is no joint infection.   Long-term arthritis treatment involves modifying activities and lifestyle to reduce joint stress jarring. This can include weight loss. Also, exercise is needed to nourish the joint cartilage and remove waste. This helps keep the muscles around the joint strong. HOME CARE INSTRUCTIONS   Do not take aspirin to relieve pain if gout is suspected. This elevates uric acid levels.   Only take over-the-counter or prescription medicines for pain, discomfort or fever as directed by your caregiver.   Rest the joint as much as possible.   If your joint is swollen, keep it elevated.   Use crutches if the painful joint is in your leg.   Drinking plenty of fluids may help for certain types of arthritis.   Follow your caregiver's dietary instructions.   Try low-impact exercise such as:   Swimming.   Water aerobics.   Biking.   Walking.   Morning stiffness is often relieved by a warm shower.   Put your joints through regular range-of-motion.  SEEK MEDICAL CARE IF:   You do not feel better in 24 hours or are getting worse.   You have side effects to medications, or are not getting better with treatment.  SEEK IMMEDIATE MEDICAL CARE IF:   You have a fever.   You develop severe joint pain, swelling or redness.   Many joints are involved and become painful and swollen.   There is severe back pain and/or leg weakness.   You have loss of bowel or bladder control.  Document Released: 09/20/2004 Document Revised: 04/25/2011 Document Reviewed: 10/06/2008 [facility] Patient Information 2012 [place], [place].

## 2011-10-22 NOTE — ED Provider Notes (Signed)
History     CSN: [id number]  Arrival date & time 10/21/11  2330   First MD Initiated Contact with Patient 10/22/11 0011      Chief Complaint  Patient presents with  . Hand Pain     HPI  History provided by the patient is a 41 year old female with history of asthma presents with complaints of worsening hand and joint pains for the past one to 2 weeks. Patient states pain became significantly worse over the past 2 days. Pain is located primarily in the right third and fourth PIP and DIP joints. Pain is described as severe and made worse with any movements or palpitations. Patient also reports associated swelling to the area. She denies injury or trauma. She denies any erythema or skin. Patient has tried occasional over-the-counter pain medications with some mild relief. Patient denies any other symptoms. Denies any other aggravating or alleviating factors.   Past Medical History  Diagnosis Date  . Asthma     History reviewed. No pertinent past surgical history.  No family history on file.  History  Substance Use Topics  . Smoking status: Current Everyday Smoker -- 1.0 packs/day    Types: Cigarettes  . Smokeless tobacco: Not on file  . Alcohol Use: Not on file    OB History    Grav Para Term Preterm Abortions TAB SAB Ect Mult Living                  Review of Systems  Constitutional: Negative for fever and chills.  Respiratory: Negative for cough and shortness of breath.   Musculoskeletal: Positive for joint swelling and arthralgias.  Skin: Negative for rash.  All other systems reviewed and are negative.    Allergies  Hydrocodone-acetaminophen and Hydromorphone hcl  Home Medications   Current Outpatient Rx  Name Route Sig Dispense Refill  . IPRATROPIUM-ALBUTEROL 18-103 MCG/ACT IN AERO Inhalation Inhale 2 puffs into the lungs every 6 (six) hours as needed. For shortness of breath     . FLUTICASONE-SALMETEROL 250-50 MCG/DOSE IN AEPB Inhalation Inhale 1 puff into  the lungs every 12 (twelve) hours. 1 puff inhaled twice daily       BP 119/81  Pulse 74  Temp(Src) 98.4 F (36.9 C) (Oral)  Resp 18  SpO2 97%  LMP 10/13/2011  Physical Exam  Nursing note and vitals reviewed. Constitutional: She is oriented to person, place, and time. She appears well-developed and well-nourished. No distress.  HENT:  Head: Normocephalic.  Cardiovascular: Normal rate and regular rhythm.   Pulmonary/Chest: Effort normal and breath sounds normal. No respiratory distress. She has no wheezes.  Musculoskeletal:       Reduced range of motion of digits and right hand secondary to pain. Tenderness to palpation over third and fourth PIP and DIP joints. Skin appears normal. No trauma or lesions. Normal distal sensations and cap refill. Will range of motion and wrists. Normal radial pulses.  Neurological: She is alert and oriented to person, place, and time.  Skin: Skin is warm and dry. No rash noted.  Psychiatric: She has a normal mood and affect. Her behavior is normal.    ED Course  Procedures   Results for orders placed during the hospital encounter of 10/21/11  SEDIMENTATION RATE      Component Value Range   Sed Rate 3  0 - 22 (mm/hr)  URIC ACID      Component Value Range   Uric Acid, Serum 3.5  2.4 - 7.0 (mg/dL)  Dg Hand Complete Right  10/22/2011  *RADIOLOGY REPORT*  Clinical Data: Right hand numbness for 2 days.  RIGHT HAND - COMPLETE 3+ VIEW  Comparison: None.  Findings: There is no evidence of fracture or dislocation.  Mild degenerative change is noted at the distal first metacarpal.  The joint spaces are preserved; the soft tissues are unremarkable in appearance.  The carpal rows are intact, and demonstrate normal alignment.  IMPRESSION: No evidence of fracture or dislocation.  Original Report Authenticated By: Tonia Ghent, M.D.     1. Arthritis       MDM  Patient seen and evaluated. Patient in no acute distress.        Angus Seller,  [place] 10/22/11 [phone]

## 2011-10-23 NOTE — ED Provider Notes (Signed)
Medical screening examination/treatment/procedure(s) were performed by non-physician practitioner and as supervising physician I was immediately available for consultation/collaboration.   Benny Lennert, MD 10/23/11 [phone]

## 2022-12-29 ENCOUNTER — Other Ambulatory Visit: Payer: Self-pay

## 2022-12-29 ENCOUNTER — Emergency Department (HOSPITAL_COMMUNITY): Payer: Self-pay

## 2022-12-29 ENCOUNTER — Encounter (HOSPITAL_COMMUNITY): Payer: Self-pay | Admitting: *Deleted

## 2022-12-29 ENCOUNTER — Emergency Department (HOSPITAL_COMMUNITY)
Admission: EM | Admit: 2022-12-29 | Discharge: 2022-12-30 | Disposition: A | Discharge status: Home / Self Care | Payer: Self-pay | Attending: Emergency Medicine | Admitting: Emergency Medicine

## 2022-12-29 DIAGNOSIS — J449 Chronic obstructive pulmonary disease, unspecified: Secondary | ICD-10-CM | POA: Insufficient documentation

## 2022-12-29 DIAGNOSIS — J441 Chronic obstructive pulmonary disease with (acute) exacerbation: Secondary | ICD-10-CM

## 2022-12-29 LAB — CBC
HCT: 45.6 % (ref 36.0–46.0)
Hemoglobin: 14.6 g/dL (ref 12.0–15.0)
MCH: 31.4 pg (ref 26.0–34.0)
MCHC: 32 g/dL (ref 30.0–36.0)
MCV: 98.1 fL (ref 80.0–100.0)
Platelets: 265 10*3/uL (ref 150–400)
RBC: 4.65 MIL/uL (ref 3.87–5.11)
RDW: 14.1 % (ref 11.5–15.5)
WBC: 6.3 10*3/uL (ref 4.0–10.5)
nRBC: 0 % (ref 0.0–0.2)

## 2022-12-29 LAB — COMPREHENSIVE METABOLIC PANEL
ALT: 15 U/L (ref 0–44)
AST: 21 U/L (ref 15–41)
Albumin: 3.7 g/dL (ref 3.5–5.0)
Alkaline Phosphatase: 96 U/L (ref 38–126)
Anion gap: 11 (ref 5–15)
BUN: 10 mg/dL (ref 6–20)
CO2: 30 mmol/L (ref 22–32)
Calcium: 8.8 mg/dL — ABNORMAL LOW (ref 8.9–10.3)
Chloride: 102 mmol/L (ref 98–111)
Creatinine, Ser: 0.8 mg/dL (ref 0.44–1.00)
GFR, Estimated: >60 mL/min (ref >60)
Glucose, Bld: 79 mg/dL (ref 70–99)
Potassium: 3 mmol/L — ABNORMAL LOW (ref 3.5–5.1)
Sodium: 143 mmol/L (ref 135–145)
Total Bilirubin: 0.6 mg/dL (ref 0.3–1.2)
Total Protein: 6.1 g/dL — ABNORMAL LOW (ref 6.5–8.1)

## 2022-12-29 LAB — URINALYSIS, ROUTINE W REFLEX MICROSCOPIC
Bacteria, UA: NONE SEEN
Bilirubin Urine: NEGATIVE
Glucose, UA: NEGATIVE mg/dL
Hgb urine dipstick: NEGATIVE
Ketones, ur: NEGATIVE mg/dL
Leukocytes,Ua: NEGATIVE
Nitrite: NEGATIVE
Protein, ur: NEGATIVE mg/dL
Specific Gravity, Urine: 1.011 (ref 1.005–1.030)
pH: 6 (ref 5.0–8.0)

## 2022-12-29 LAB — LIPASE, BLOOD: Lipase: 31 U/L (ref 11–51)

## 2022-12-29 NOTE — ED Triage Notes (Signed)
I have to keep waking the pt up from sleeping to get my question answered

## 2022-12-29 NOTE — ED Triage Notes (Signed)
The pt reports that she has had diff breathing for a few months worse today   she also feels weird pain in her hips knees and lower back

## 2022-12-30 ENCOUNTER — Other Ambulatory Visit: Payer: Self-pay

## 2022-12-30 ENCOUNTER — Emergency Department
Admission: EM | Admit: 2022-12-30 | Discharge: 2022-12-30 | Disposition: A | Discharge status: Home / Self Care | Payer: Self-pay | Attending: Emergency Medicine | Admitting: Emergency Medicine

## 2022-12-30 DIAGNOSIS — J449 Chronic obstructive pulmonary disease, unspecified: Secondary | ICD-10-CM | POA: Insufficient documentation

## 2022-12-30 DIAGNOSIS — Z5900 Homelessness unspecified: Secondary | ICD-10-CM | POA: Insufficient documentation

## 2022-12-30 DIAGNOSIS — K623 Rectal prolapse: Secondary | ICD-10-CM | POA: Insufficient documentation

## 2022-12-30 DIAGNOSIS — J441 Chronic obstructive pulmonary disease with (acute) exacerbation: Secondary | ICD-10-CM | POA: Insufficient documentation

## 2022-12-30 DIAGNOSIS — J45909 Unspecified asthma, uncomplicated: Secondary | ICD-10-CM | POA: Insufficient documentation

## 2022-12-30 DIAGNOSIS — Z1152 Encounter for screening for COVID-19: Secondary | ICD-10-CM | POA: Insufficient documentation

## 2022-12-30 LAB — URINE DRUG SCREEN, QUALITATIVE (ARMC ONLY)
Amphetamines, Ur Screen: NOT DETECTED
Barbiturates, Ur Screen: NOT DETECTED
Benzodiazepine, Ur Scrn: NOT DETECTED
Cannabinoid 50 Ng, Ur ~~LOC~~: POSITIVE — AB
Cocaine Metabolite,Ur ~~LOC~~: POSITIVE — AB
MDMA (Ecstasy)Ur Screen: NOT DETECTED
Methadone Scn, Ur: NOT DETECTED
Opiate, Ur Screen: NOT DETECTED
Phencyclidine (PCP) Ur S: NOT DETECTED
Tricyclic, Ur Screen: NOT DETECTED

## 2022-12-30 LAB — RAPID URINE DRUG SCREEN, HOSP PERFORMED
Amphetamines: NOT DETECTED
Barbiturates: NOT DETECTED
Benzodiazepines: NOT DETECTED
Cocaine: POSITIVE — AB
Opiates: NOT DETECTED
Tetrahydrocannabinol: POSITIVE — AB

## 2022-12-30 LAB — BLOOD GAS, VENOUS
Acid-Base Excess: 6.9 mmol/L — ABNORMAL HIGH (ref 0.0–2.0)
Bicarbonate: 32.5 mmol/L — ABNORMAL HIGH (ref 20.0–28.0)
O2 Saturation: 94.3 %
Patient temperature: 37
pCO2, Ven: 49 mmHg (ref 44–60)
pH, Ven: 7.43 (ref 7.25–7.43)
pO2, Ven: 64 mmHg — ABNORMAL HIGH (ref 32–45)

## 2022-12-30 LAB — URINALYSIS, ROUTINE W REFLEX MICROSCOPIC
Bilirubin Urine: NEGATIVE
Glucose, UA: NEGATIVE mg/dL
Hgb urine dipstick: NEGATIVE
Ketones, ur: NEGATIVE mg/dL
Leukocytes,Ua: NEGATIVE
Nitrite: NEGATIVE
Protein, ur: NEGATIVE mg/dL
Specific Gravity, Urine: 1.008 (ref 1.005–1.030)
pH: 7 (ref 5.0–8.0)

## 2022-12-30 LAB — BASIC METABOLIC PANEL
Anion gap: 6 (ref 5–15)
BUN: 10 mg/dL (ref 6–20)
CO2: 28 mmol/L (ref 22–32)
Calcium: 9.1 mg/dL (ref 8.9–10.3)
Chloride: 106 mmol/L (ref 98–111)
Creatinine, Ser: 0.56 mg/dL (ref 0.44–1.00)
GFR, Estimated: >60 mL/min (ref >60)
Glucose, Bld: 144 mg/dL — ABNORMAL HIGH (ref 70–99)
Potassium: 3.6 mmol/L (ref 3.5–5.1)
Sodium: 140 mmol/L (ref 135–145)

## 2022-12-30 LAB — CBC
HCT: 42.8 % (ref 36.0–46.0)
Hemoglobin: 14 g/dL (ref 12.0–15.0)
MCH: 31.9 pg (ref 26.0–34.0)
MCHC: 32.7 g/dL (ref 30.0–36.0)
MCV: 97.5 fL (ref 80.0–100.0)
Platelets: 239 10*3/uL (ref 150–400)
RBC: 4.39 MIL/uL (ref 3.87–5.11)
RDW: 14 % (ref 11.5–15.5)
WBC: 6.3 10*3/uL (ref 4.0–10.5)
nRBC: 0 % (ref 0.0–0.2)

## 2022-12-30 LAB — SARS CORONAVIRUS 2 BY RT PCR: SARS Coronavirus 2 by RT PCR: NEGATIVE

## 2022-12-30 MED ORDER — IPRATROPIUM-ALBUTEROL 0.5-2.5 (3) MG/3ML IN SOLN
3.0000 mL | RESPIRATORY_TRACT | Status: AC
  Administered 2022-12-30 (×3): 3 mL via RESPIRATORY_TRACT
  Filled 2022-12-30 (×3): qty 3

## 2022-12-30 MED ORDER — POTASSIUM CHLORIDE 10 MEQ/100ML IV SOLN
10.0000 meq | Freq: Once | INTRAVENOUS | Status: AC
  Administered 2022-12-30: 10 meq via INTRAVENOUS
  Filled 2022-12-30: qty 100

## 2022-12-30 MED ORDER — METHYLPREDNISOLONE SODIUM SUCC 125 MG IJ SOLR
125.0000 mg | Freq: Once | INTRAMUSCULAR | Status: AC
  Administered 2022-12-30: 125 mg via INTRAVENOUS
  Filled 2022-12-30: qty 2

## 2022-12-30 MED ORDER — POTASSIUM CHLORIDE 20 MEQ PO PACK
40.0000 meq | PACK | Freq: Two times a day (BID) | ORAL | Status: DC
  Administered 2022-12-30: 40 meq via ORAL
  Filled 2022-12-30: qty 2

## 2022-12-30 MED ORDER — ALBUTEROL SULFATE (2.5 MG/3ML) 0.083% IN NEBU
2.5000 mg | INHALATION_SOLUTION | Freq: Once | RESPIRATORY_TRACT | Status: AC
  Administered 2022-12-30: 2.5 mg via RESPIRATORY_TRACT
  Filled 2022-12-30: qty 3

## 2022-12-30 MED ORDER — ALBUTEROL SULFATE HFA 108 (90 BASE) MCG/ACT IN AERS
4.0000 | INHALATION_SPRAY | Freq: Once | RESPIRATORY_TRACT | Status: AC
  Administered 2022-12-30: 4 via RESPIRATORY_TRACT
  Filled 2022-12-30: qty 6.7

## 2022-12-30 MED ORDER — ALBUTEROL SULFATE HFA 108 (90 BASE) MCG/ACT IN AERS: 2.0000 | INHALATION_SPRAY | RESPIRATORY_TRACT | 0 refills | Status: DC | PRN

## 2022-12-30 MED ORDER — PREDNISONE 20 MG PO TABS: ORAL_TABLET | ORAL | 0 refills | Status: DC

## 2022-12-30 MED ORDER — IBUPROFEN 600 MG PO TABS
600.0000 mg | ORAL_TABLET | Freq: Once | ORAL | Status: AC
  Administered 2022-12-30: 600 mg via ORAL
  Filled 2022-12-30: qty 1

## 2022-12-30 NOTE — ED Notes (Signed)
Pt given lunch tray and gingerale
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
## 2022-12-30 NOTE — ED Provider Notes (Signed)
[facility] Provider Note    Event Date/Time   First MD Initiated Contact with Patient 12/30/22 1228     (approximate)   History   Shortness of Breath and Abdominal Pain   HPI  Stacy Bolton is a 52 y.o. female history of asthma/COPD and rectal prolapse who presents with multiple complaints.  The patient states that she was seen at the [facility] ED last night into this morning for COPD exacerbation but that they did not address all of her issues.  The patient states that she was prescribed medication but is unable to fill it currently.  She reports that she is homeless.  She also reports lower abdominal and rectal area pain.  She states that she has a chronic rectal prolapse and states that it comes out every time she has a bowel movement and sometimes when she urinates as well although she states that it does go back in.  She denies any acute change in the symptoms in the last few days.  She has no vomiting or diarrhea.  She denies any acute urinary symptoms.  The patient also believes that she smoked marijuana yesterday that was laced with cocaine.  She is also requesting to speak to a Child psychotherapist.  I reviewed the past medical records.  The patient was seen at the [facility] emergency department yesterday evening and discharged early this morning.  Chest x-ray showed emphysema with no acute findings.  She was treated with DuoNebs and Solu-Medrol for COPD exacerbation.   Physical Exam   Triage Vital Signs: ED Triage Vitals [12/30/22 1156]  Enc Vitals Group     BP 121/87     Pulse Rate 95     Resp 18     Temp 99.4 F (37.4 C)     Temp Source Oral     SpO2 100 %     Weight 119 lb 11.4 oz (54.3 kg)     Height 5\' 7"  (1.702 m)     Head Circumference      Peak Flow      Pain Score 10     Pain Loc      Pain Edu?      Excl. in GC?     Most recent vital signs: Vitals:   12/30/22 1200 12/30/22 1230  BP: 138/85 133/84  Pulse: 87 93  Resp: (!) 25  15  Temp:    SpO2: 97% 97%     General: Sleepy appearing but fully arousable, no distress.  CV:  Good peripheral perfusion.  Resp:  Normal effort.  Lungs with some faint wheezes bilaterally.  Good air entry. Abd:  Soft and nontender.  No distention.  Other:  Normal-appearing anal rectal area with no visible prolapse.   ED Results / Procedures / Treatments   Labs (all labs ordered are listed, but only abnormal results are displayed) Labs Reviewed  BASIC METABOLIC PANEL - Abnormal; Notable for the following components:      Result Value   Glucose, Bld 144 (*)    All other components within normal limits  URINE DRUG SCREEN, QUALITATIVE ([facility] ONLY) - Abnormal; Notable for the following components:   Cocaine Metabolite,Ur [place] POSITIVE (*)    Cannabinoid 50 Ng, Ur [place] POSITIVE (*)    All other components within normal limits  URINALYSIS, ROUTINE W REFLEX MICROSCOPIC - Abnormal; Notable for the following components:   Color, Urine STRAW (*)    APPearance CLEAR (*)  All other components within normal limits  SARS CORONAVIRUS 2 BY RT PCR  CBC     EKG  ED ECG REPORT I, Dionne Bucy, the attending physician, personally viewed and interpreted this ECG.  Date: 12/30/2022 EKG Time: 1153 Rate: 86 Rhythm: normal sinus rhythm QRS Axis: normal Intervals: normal ST/T Wave abnormalities: normal Narrative Interpretation: no evidence of acute ischemia    RADIOLOGY     PROCEDURES:  Critical Care performed: No  Procedures   MEDICATIONS ORDERED IN ED: Medications  albuterol (PROVENTIL) (2.5 MG/3ML) 0.083% nebulizer solution 2.5 mg (2.5 mg Nebulization Given 12/30/22 1322)  albuterol (PROVENTIL) (2.5 MG/3ML) 0.083% nebulizer solution 2.5 mg (2.5 mg Nebulization Given 12/30/22 1322)  ibuprofen (ADVIL) tablet 600 mg (600 mg Oral Given 12/30/22 1444)     IMPRESSION / MDM / ASSESSMENT AND PLAN / ED COURSE  I reviewed the triage vital signs and the nursing  notes.  52 year old female with PMH as noted above presents with multiple complaints including ongoing COPD symptoms, pain due to chronic rectal prolapse, concerned that she ingested cocaine, and requesting to see a Child psychotherapist.  On exam the patient is well-appearing with normal vital signs.  She has no respiratory distress.  O2 saturation is in the high 90s on room air.  There is some faint wheezing.  She is sleepy but fully arousable.  External anorectal exam reveals no evidence of active prolapse.  Differential diagnosis includes, but is not limited to:  Respiratory symptoms:  COPD exacerbation, asthma, acute bronchitis.  The patient had a negative workup yesterday including a chest x-ray.  There is no indication to repeat this.  I have ordered a COVID swab.  Chronic rectal prolapse: Given the intermittent, reducible prolapse I explained to the patient that there is no indication for emergent surgical consultation.  This will need to be managed as an outpatient.  We will give her a referral.  Social issues: I have placed a consult to [facility] to help the patient with medication and homelessness resources.  Patient's presentation is most consistent with exacerbation of chronic illness.  The patient is on the cardiac monitor to evaluate for evidence of arrhythmia and/or significant heart rate changes.  ----------------------------------------- 3:43 PM on 12/30/2022 -----------------------------------------  I consulted and discussed the case with LCSW Totman who will talk to the patient.  COVID swab is negative.  BMP and CBC are unremarkable.  UDS is positive for cocaine and canal voids consistent with patient's history.  I counseled her on the results of the workup.  She is stable for discharge at this time.  She appears comfortable.  I have provided surgery referral for the rectal prolapse.  She already has prescriptions from [facility] from this morning.  The patient is in agreement with the  plan.  Return precautions given, and she expresses understanding.   FINAL CLINICAL IMPRESSION(S) / ED DIAGNOSES   Final diagnoses:  COPD exacerbation (HCC)  Rectal prolapse     Rx / DC Orders   ED Discharge Orders     None        Note:  This document was prepared using Dragon voice recognition software and may include unintentional dictation errors.    Dionne Bucy, MD 12/30/22 1544

## 2022-12-30 NOTE — ED Provider Notes (Signed)
[facility] EMERGENCY DEPARTMENT AT [facility] Provider Note   CSN: [id number] Arrival date & time: 12/29/22  1952     History  Chief Complaint  Patient presents with   Shortness of Breath    Stacy Bolton is a 52 y.o. female.  52 year old female smoker with history of COPD that presents the ER today for shortness of breath.  Patient states that she has felt bad throughout the day today.  No fevers.  Does have a cough.  Does not have inhalers she states that all of her medications were burned or something.  No lower extremity swelling.   Shortness of Breath      Home Medications Prior to Admission medications   Medication Sig Start Date End Date Taking? Authorizing Provider  albuterol-ipratropium (COMBIVENT) 18-103 MCG/ACT inhaler Inhale 2 puffs into the lungs every 6 (six) hours as needed. For shortness of breath     [provider]  Fluticasone-Salmeterol (ADVAIR DISKUS) 250-50 MCG/DOSE AEPB Inhale 1 puff into the lungs every 12 (twelve) hours. 1 puff inhaled twice daily     [provider]  predniSONE (DELTASONE) 20 MG tablet 2 tabs po daily x 4 days 12/31/22   Reiss Mowrey, Barbara Cower, MD      Allergies    Hydrocodone-acetaminophen and Hydromorphone hcl    Review of Systems   Review of Systems  Respiratory:  Positive for shortness of breath.     Physical Exam Updated Vital Signs BP 103/67   Pulse 86   Temp 98 F (36.7 C)   Resp (!) 22   Ht 5\' 7"  (1.702 m)   Wt 53.3 kg   LMP 10/13/2011   SpO2 94%   BMI 18.40 kg/m  Physical Exam Vitals and nursing note reviewed.  Constitutional:      Appearance: She is well-developed.  HENT:     Head: Normocephalic and atraumatic.  Cardiovascular:     Rate and Rhythm: Normal rate and regular rhythm.  Pulmonary:     Effort: No respiratory distress.     Breath sounds: No stridor. Decreased breath sounds and wheezing present.  Abdominal:     General: There is no distension.  Musculoskeletal:         General: Normal range of motion.     Cervical back: Normal range of motion.     Right lower leg: No edema.     Left lower leg: No edema.  Skin:    General: Skin is warm and dry.  Neurological:     General: No focal deficit present.     Mental Status: She is alert.     ED Results / Procedures / Treatments   Labs (all labs ordered are listed, but only abnormal results are displayed) Labs Reviewed  COMPREHENSIVE METABOLIC PANEL - Abnormal; Notable for the following components:      Result Value   Potassium 3.0 (*)    Calcium 8.8 (*)    Total Protein 6.1 (*)    All other components within normal limits  RAPID URINE DRUG SCREEN, HOSP PERFORMED - Abnormal; Notable for the following components:   Cocaine POSITIVE (*)    Tetrahydrocannabinol POSITIVE (*)    All other components within normal limits  BLOOD GAS, VENOUS - Abnormal; Notable for the following components:   pO2, Ven 64 (*)    Bicarbonate 32.5 (*)    Acid-Base Excess 6.9 (*)    All other components within normal limits  LIPASE, BLOOD  CBC  URINALYSIS, ROUTINE W  REFLEX MICROSCOPIC    EKG EKG Interpretation  Date/Time:  Saturday Dec 29 2022 20:01:43 EDT Ventricular Rate:  77 PR Interval:  114 QRS Duration: 78 QT Interval:  400 QTC Calculation: 452 R Axis:   88 Text Interpretation: Normal sinus rhythm Nonspecific ST and T wave abnormality Abnormal ECG When compared with ECG of 20-Mar-2010 10:30, PREVIOUS ECG IS PRESENT Confirmed by Marily Memos [phone]) on 12/29/2022 11:27:50 PM  Radiology DG Chest 2 View  Result Date: 12/29/2022 CLINICAL DATA:  Short of breath EXAM: CHEST - 2 VIEW COMPARISON:  03/19/2010 FINDINGS: Frontal and lateral views of the chest are obtained on 3 images. Cardiac silhouette is unremarkable. Lungs are hyperinflated with background emphysema. No acute airspace disease, effusion, or pneumothorax. No acute bony abnormalities. IMPRESSION: 1. Emphysema.  No acute intrathoracic process. Electronically  Signed   By: Sharlet Salina M.D.   On: 12/29/2022 23:23    Procedures Procedures    Medications Ordered in ED Medications  potassium chloride (KLOR-CON) packet 40 mEq (40 mEq Oral Given 12/30/22 0240)  albuterol (VENTOLIN HFA) 108 (90 Base) MCG/ACT inhaler 4 puff (has no administration in time range)  ipratropium-albuterol (DUONEB) 0.5-2.5 (3) MG/3ML nebulizer solution 3 mL (3 mLs Nebulization Given 12/30/22 0041)  methylPREDNISolone sodium succinate (SOLU-MEDROL) 125 mg/2 mL injection 125 mg (125 mg Intravenous Given 12/30/22 0015)  potassium chloride 10 mEq in 100 mL IVPB (0 mEq Intravenous Stopped 12/30/22 0254)    ED Course/ Medical Decision Making/ A&P                             Medical Decision Making Amount and/or Complexity of Data Reviewed Labs: ordered. Radiology: ordered.  Risk Prescription drug management.   No hypoxia or respiratory distress.  Not tachypneic.  She has diminished with wheezing will give her a breathing treatment.  No evidence of pneumonia on x-ray.  The rest of her labs are reassuring.  Reevaluation patient appears well.  She is able to ambulate without significant tachypnea or hypoxia.  Stable for discharge on COPD exacerbation treatment.  Final Clinical Impression(s) / ED Diagnoses Final diagnoses:  COPD exacerbation (HCC)    Rx / DC Orders ED Discharge Orders          Ordered    predniSONE (DELTASONE) 20 MG tablet  Status:  Discontinued        12/30/22 0414    predniSONE (DELTASONE) 20 MG tablet        12/30/22 0449              Miley Lindon, Barbara Cower, MD 12/30/22 [phone]

## 2022-12-30 NOTE — ED Triage Notes (Signed)
Pt presents with complaints of rectal pain from a prolapse that is bothering her. Pt also reports that was seen in the ED already today for ShOB, but "they did not treat anything." Pt also c/o bilateral knee pain.

## 2022-12-30 NOTE — Discharge Instructions (Addendum)
Fill the prescriptions from [facility] and take them as prescribed.  Follow-up with general surgery.  Return to the ER for new, worsening, or persistent severe shortness of breath, cough, fever, chest pain, persistent prolapse of your rectum, or any other new or worsening symptoms that concern you.  Medication assistance  [facility] [address] [place], [address] [phone]  Domestic Violence Resource   Family Abuse Services  [place]: [phone] [address], [place], [place] [address]  [facility] of the [place]  Crisis [place]: [phone]  [facility]  [address]  [place], [place] [address] [phone]   [facility]  [address]  [place], [place] [address] [phone]   Non-Emergency Numbers  [place] Police: [phone] [facility] Police: [phone]

## 2022-12-30 NOTE — ED Notes (Signed)
Pt states she has some numbness/tingling in both arms.

## 2022-12-30 NOTE — Progress Notes (Signed)
Homeless resources added to patients AVS and information on the [facility].

## 2022-12-30 NOTE — ED Notes (Signed)
Pt oxygen while ambulated stayed at  94%

## 2022-12-30 NOTE — ED Notes (Signed)
Pt falling asleep during questions, hard to stay awake. EMS did say she was sleepy and acted the same en route.

## 2022-12-31 ENCOUNTER — Emergency Department
Admission: EM | Admit: 2022-12-31 | Discharge: 2022-12-31 | Disposition: A | Discharge status: Home / Self Care | Payer: Self-pay | Attending: Emergency Medicine | Admitting: Emergency Medicine

## 2022-12-31 DIAGNOSIS — J449 Chronic obstructive pulmonary disease, unspecified: Secondary | ICD-10-CM

## 2022-12-31 DIAGNOSIS — K623 Rectal prolapse: Secondary | ICD-10-CM

## 2022-12-31 DIAGNOSIS — Z5900 Homelessness unspecified: Secondary | ICD-10-CM

## 2022-12-31 HISTORY — DX: Anxiety disorder, unspecified: F41.9

## 2022-12-31 HISTORY — DX: Unspecified convulsions: R56.9

## 2022-12-31 HISTORY — DX: Endometriosis, unspecified: N80.9

## 2022-12-31 HISTORY — DX: Chronic obstructive pulmonary disease, unspecified: J44.9

## 2022-12-31 MED ORDER — PREDNISONE 20 MG PO TABS: ORAL_TABLET | ORAL | 0 refills | Status: DC

## 2022-12-31 MED ORDER — BENZONATATE 100 MG PO CAPS
200.0000 mg | ORAL_CAPSULE | Freq: Once | ORAL | Status: AC
  Administered 2022-12-31: 200 mg via ORAL
  Filled 2022-12-31: qty 2

## 2022-12-31 MED ORDER — ALBUTEROL SULFATE HFA 108 (90 BASE) MCG/ACT IN AERS
2.0000 | INHALATION_SPRAY | Freq: Once | RESPIRATORY_TRACT | Status: AC
  Administered 2022-12-31: 2 via RESPIRATORY_TRACT
  Filled 2022-12-31: qty 6.7

## 2022-12-31 MED ORDER — PREDNISONE 20 MG PO TABS
60.0000 mg | ORAL_TABLET | Freq: Once | ORAL | Status: AC
  Administered 2022-12-31: 60 mg via ORAL
  Filled 2022-12-31 (×2): qty 3

## 2022-12-31 NOTE — ED Provider Notes (Signed)
[facility] Provider Note    Event Date/Time   First MD Initiated Contact with Patient 12/31/22 [phone]     (approximate)   History   Rectal Pain   HPI  Stacy Bolton is a 52 y.o. female homeless who returns to the ED with mainly a chief complaint of rectal pain from a chronic rectal prolapse.  Recently seen at [facility] for a COPD flare.  Unable to get her prednisone.  Seen yesterday in the ED for same and referred to general surgery.  Endorses dry cough and wheezing.  Denies fever/chills, chest pain, abdominal pain, nausea, vomiting or diarrhea.     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 Authorizing Provider  predniSONE (DELTASONE) 20 MG tablet 3 tablets daily x 4 days 12/31/22  Yes Irean Hong, MD  albuterol (PROVENTIL HFA) 108 (90 Base) MCG/ACT inhaler Inhale 2 puffs into the lungs every 4 (four) hours as needed for wheezing or shortness of breath. 12/30/22   Sharman Cheek, MD  albuterol-ipratropium [facility]) [phone] MCG/ACT inhaler Inhale 2 puffs into the lungs every 6 (six) hours as needed. For shortness of breath     [provider]  Fluticasone-Salmeterol (ADVAIR DISKUS) 250-50 MCG/DOSE AEPB Inhale 1 puff into the lungs every 12 (twelve) hours. 1 puff  inhaled twice daily     [provider]  predniSONE (DELTASONE) 20 MG tablet 2 tabs po daily x 4 days 12/31/22   Mesner, Barbara Cower, MD     Allergies  Hydrocodone-acetaminophen and Hydromorphone hcl   Family History  History reviewed. No pertinent family history.   Physical Exam  Triage Vital Signs: ED Triage Vitals  Enc Vitals Group     BP 12/30/22 2201 112/77     Pulse Rate 12/30/22 2201 89     Resp 12/30/22 2201 20     Temp 12/30/22 2201 98.5 F (36.9 C)     Temp src --      SpO2 12/30/22 2201 94 %     Weight 12/30/22 2202 125 lb (56.7 kg)     Height 12/30/22 2202 5\' 7"  (1.702 m)     Head Circumference --      Peak Flow --      Pain Score 12/30/22 2202 8     Pain Loc --      Pain Edu? --      Excl. in GC? --     Updated Vital Signs: BP 112/77 (BP Location: Right Arm)   Pulse 89   Temp 98.5 F (36.9 C)   Resp 20   Ht 5\' 7"  (1.702 m)   Wt 56.7 kg   LMP 10/13/2011   SpO2 94%   BMI 19.58 kg/m  General: Awake, mild distress.  CV:  RRR.  Good peripheral perfusion.  Resp:  Normal effort.  CTAB.  Active dry cough. Abd:  Nontender.  No distention.  Other:  Large easily reducible rectal prolapse.   ED Results / Procedures / Treatments  Labs (all labs ordered are listed, but only abnormal results are displayed) Labs Reviewed - No data to display   EKG  None   RADIOLOGY None   Official radiology report(s): No results found.   PROCEDURES:  Critical Care performed: No  Procedures   MEDICATIONS ORDERED IN ED: Medications  predniSONE (DELTASONE) tablet 60 mg (has no administration in time range)  benzonatate (TESSALON) capsule 200 mg (has no administration in time range)  albuterol (VENTOLIN HFA) 108 (90 Base) MCG/ACT inhaler 2 puff (has no administration in time range)     IMPRESSION / MDM / ASSESSMENT AND PLAN / ED COURSE  I reviewed the triage vital signs and the nursing notes.                             52 year old female who  returns to the ED within 24 hours with continued chief complaint of rectal prolapse and COPD flareup.  Rectal prolapse was easily reducible at bedside.  Patient is actively coughing with a dry cough but no wheezing or shortness of breath.  She is not to tachypneic, tachycardic nor hypoxic.  Will administer prednisone and albuterol now along with Tessalon.  Will ask hospital pharmacy to send patient with the rest of her 3-day course of prednisone.  Have strongly encourage patient to follow-up with general surgery for her rectal prolapse.  I personally reviewed patient's records and note her ED visit from [facility] on 12/29/2022 and her ED visit here from yesterday.  Social work was consulted yesterday who gave patient resources.  I do not think there would be additional benefit to holding the patient overnight for social work to see in the morning.  Strict return precautions given.  Patient verbalizes understanding and agrees with plan of care.  Patient's presentation is most consistent with acute, uncomplicated illness.   FINAL CLINICAL IMPRESSION(S) / ED DIAGNOSES   Final diagnoses:  Rectal prolapse  Chronic obstructive pulmonary disease, unspecified COPD type (HCC)  Homelessness     Rx / DC Orders   ED Discharge Orders          Ordered    predniSONE (DELTASONE) 20 MG tablet        12/31/22 6387             Note:  This document was prepared using Dragon voice recognition software and may include unintentional dictation errors.   Irean Hong, MD 12/31/22 [phone]

## 2022-12-31 NOTE — Discharge Instructions (Signed)
Take the steroid medicine provided to you.  Take your next dose Tuesday morning.  You must make an appointment with the surgeon to fix your rectal prolapse. Return to the ER for wrosening symptoms, persistent vomiting, difficulty breathing or other concerns.

## 2022-12-31 NOTE — ED Notes (Signed)
Call light in [facility] lobby bathroom went off due to this pt pulling the cord. EDT and RN arrived to find this pt with pants at her knees, touching her rectum stating "I think it prolapsed." RN and EDT informed pt that the MD would be able to further assist with the prolapse, and that there was nothing that could be done in the lobby. Pt nodded and said "Okay."  Pt encouraged to pull up her pants so she could exit the bathroom. Pt washed her hands, and was assisted to her wheelchair by this EDT due to pt feeling unwell to walk due to the suspected rectal prolapse. Pt sat in the lobby and pt got onto a lobby chair to lay down. Pt stated it helped her rectum not hurt. Pts purse and belongings positioned near her after she got positioned in the lobby chair. Pt denied needing anything else at this time.

## 2023-01-16 DIAGNOSIS — J449 Chronic obstructive pulmonary disease, unspecified: Secondary | ICD-10-CM | POA: Insufficient documentation

## 2023-01-16 DIAGNOSIS — J441 Chronic obstructive pulmonary disease with (acute) exacerbation: Secondary | ICD-10-CM | POA: Insufficient documentation

## 2023-01-30 ENCOUNTER — Encounter (HOSPITAL_COMMUNITY): Payer: Self-pay | Admitting: Behavioral Health

## 2023-01-30 ENCOUNTER — Ambulatory Visit (HOSPITAL_COMMUNITY)
Admission: EM | Admit: 2023-01-30 | Discharge: 2023-01-31 | Disposition: A | Discharge status: Other Acute Inpatient Hospital | Payer: No Payment, Other | Attending: Behavioral Health | Admitting: Behavioral Health

## 2023-01-30 DIAGNOSIS — F17210 Nicotine dependence, cigarettes, uncomplicated: Secondary | ICD-10-CM | POA: Diagnosis not present

## 2023-01-30 DIAGNOSIS — F23 Brief psychotic disorder: Secondary | ICD-10-CM | POA: Diagnosis present

## 2023-01-30 LAB — URINALYSIS, ROUTINE W REFLEX MICROSCOPIC
Bilirubin Urine: NEGATIVE
Glucose, UA: NEGATIVE mg/dL
Hgb urine dipstick: NEGATIVE
Ketones, ur: NEGATIVE mg/dL
Leukocytes,Ua: NEGATIVE
Nitrite: NEGATIVE
Protein, ur: NEGATIVE mg/dL
Specific Gravity, Urine: 1.01 (ref 1.005–1.030)
pH: 6 (ref 5.0–8.0)

## 2023-01-30 LAB — CBC WITH DIFFERENTIAL/PLATELET
Abs Immature Granulocytes: 0.04 10*3/uL (ref 0.00–0.07)
Basophils Absolute: 0.1 10*3/uL (ref 0.0–0.1)
Basophils Relative: 1 %
Eosinophils Absolute: 0.1 10*3/uL (ref 0.0–0.5)
Eosinophils Relative: 2 %
HCT: 42.6 % (ref 36.0–46.0)
Hemoglobin: 13.9 g/dL (ref 12.0–15.0)
Immature Granulocytes: 1 %
Lymphocytes Relative: 31 %
Lymphs Abs: 2.3 10*3/uL (ref 0.7–4.0)
MCH: 31 pg (ref 26.0–34.0)
MCHC: 32.6 g/dL (ref 30.0–36.0)
MCV: 94.9 fL (ref 80.0–100.0)
Monocytes Absolute: 0.9 10*3/uL (ref 0.1–1.0)
Monocytes Relative: 13 %
Neutro Abs: 3.9 10*3/uL (ref 1.7–7.7)
Neutrophils Relative %: 52 %
Platelets: 330 10*3/uL (ref 150–400)
RBC: 4.49 MIL/uL (ref 3.87–5.11)
RDW: 14.8 % (ref 11.5–15.5)
WBC: 7.4 10*3/uL (ref 4.0–10.5)
nRBC: 0 % (ref 0.0–0.2)

## 2023-01-30 LAB — COMPREHENSIVE METABOLIC PANEL
ALT: 24 U/L (ref 0–44)
AST: 23 U/L (ref 15–41)
Albumin: 3.5 g/dL (ref 3.5–5.0)
Alkaline Phosphatase: 83 U/L (ref 38–126)
Anion gap: 10 (ref 5–15)
BUN: 16 mg/dL (ref 6–20)
CO2: 29 mmol/L (ref 22–32)
Calcium: 8.7 mg/dL — ABNORMAL LOW (ref 8.9–10.3)
Chloride: 95 mmol/L — ABNORMAL LOW (ref 98–111)
Creatinine, Ser: 0.66 mg/dL (ref 0.44–1.00)
GFR, Estimated: >60 mL/min (ref >60)
Glucose, Bld: 120 mg/dL — ABNORMAL HIGH (ref 70–99)
Potassium: 3.7 mmol/L (ref 3.5–5.1)
Sodium: 134 mmol/L — ABNORMAL LOW (ref 135–145)
Total Bilirubin: 0.9 mg/dL (ref 0.3–1.2)
Total Protein: 6.1 g/dL — ABNORMAL LOW (ref 6.5–8.1)

## 2023-01-30 LAB — ETHANOL: Alcohol, Ethyl (B): <10 mg/dL (ref <10)

## 2023-01-30 LAB — TSH: TSH: 4.773 u[IU]/mL — ABNORMAL HIGH (ref 0.350–4.500)

## 2023-01-30 LAB — POCT URINE DRUG SCREEN - MANUAL ENTRY (I-SCREEN)
POC Amphetamine UR: NOT DETECTED
POC Buprenorphine (BUP): NOT DETECTED
POC Cocaine UR: POSITIVE — AB
POC Marijuana UR: POSITIVE — AB
POC Methadone UR: NOT DETECTED
POC Methamphetamine UR: NOT DETECTED
POC Morphine: NOT DETECTED
POC Oxazepam (BZO): NOT DETECTED
POC Oxycodone UR: NOT DETECTED
POC Secobarbital (BAR): NOT DETECTED

## 2023-01-30 LAB — LIPID PANEL
Cholesterol: 165 mg/dL (ref 0–200)
HDL: 62 mg/dL (ref >40)
LDL Cholesterol: 91 mg/dL (ref 0–99)
Total CHOL/HDL Ratio: 2.7 RATIO
Triglycerides: 58 mg/dL (ref <150)
VLDL: 12 mg/dL (ref 0–40)

## 2023-01-30 LAB — MAGNESIUM: Magnesium: 2.3 mg/dL (ref 1.7–2.4)

## 2023-01-30 LAB — VALPROIC ACID LEVEL: Valproic Acid Lvl: <10 ug/mL — ABNORMAL LOW (ref 50.0–100.0)

## 2023-01-30 LAB — POC URINE PREG, ED: Preg Test, Ur: NEGATIVE

## 2023-01-30 MED ORDER — HYDROXYZINE HCL 25 MG PO TABS
25.0000 mg | ORAL_TABLET | Freq: Three times a day (TID) | ORAL | Status: DC | PRN
  Administered 2023-01-30 – 2023-01-31 (×2): 25 mg via ORAL
  Filled 2023-01-30 (×2): qty 1

## 2023-01-30 MED ORDER — TRAZODONE HCL 50 MG PO TABS: 50.0000 mg | ORAL_TABLET | Freq: Every evening | ORAL | Status: DC | PRN

## 2023-01-30 MED ORDER — OLANZAPINE 10 MG PO TBDP
10.0000 mg | ORAL_TABLET | Freq: Every day | ORAL | Status: DC
  Filled 2023-01-30: qty 1

## 2023-01-30 MED ORDER — DIVALPROEX SODIUM ER 500 MG PO TB24
500.0000 mg | ORAL_TABLET | Freq: Every day | ORAL | Status: DC
  Administered 2023-01-30 – 2023-01-31 (×2): 500 mg via ORAL
  Filled 2023-01-30 (×2): qty 1

## 2023-01-30 MED ORDER — ALBUTEROL SULFATE HFA 108 (90 BASE) MCG/ACT IN AERS: 2.0000 | INHALATION_SPRAY | RESPIRATORY_TRACT | Status: DC | PRN

## 2023-01-30 MED ORDER — RISPERIDONE 1 MG PO TABS
1.0000 mg | ORAL_TABLET | Freq: Every day | ORAL | Status: DC
  Administered 2023-01-30: 1 mg via ORAL
  Filled 2023-01-30: qty 1

## 2023-01-30 MED ORDER — ALUM & MAG HYDROXIDE-SIMETH 200-200-20 MG/5ML PO SUSP: 30.0000 mL | ORAL | Status: DC | PRN

## 2023-01-30 MED ORDER — FLUTICASONE FUROATE-VILANTEROL 200-25 MCG/ACT IN AEPB
1.0000 | INHALATION_SPRAY | Freq: Every day | RESPIRATORY_TRACT | Status: DC
  Administered 2023-01-31: 1 via RESPIRATORY_TRACT
  Filled 2023-01-30: qty 28

## 2023-01-30 MED ORDER — MAGNESIUM HYDROXIDE 400 MG/5ML PO SUSP: 30.0000 mL | Freq: Every day | ORAL | Status: DC | PRN

## 2023-01-30 NOTE — ED Notes (Signed)
Pt sleeping at present, no distress noted.  Monitoring for safety. 

## 2023-01-30 NOTE — ED Notes (Signed)
Pt A&O x 4, no distress noted, anxious & needy, pleasant and cooperative.  Denies SI, HI.  Monitoring for safety.

## 2023-01-30 NOTE — ED Provider Notes (Addendum)
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 metabolites    200 Benzodiazepine                 200 Opiates and metabolites        300 Cocaine and metabolites        300 THC                            50 Performed at [facility], [address], [place], [place] [address]    pH, Ven 12/30/2022 7.43  7.25 - 7.43 Final   pCO2, Ven 12/30/2022 49  44 - 60 mmHg Final   pO2, Ven 12/30/2022 64 (H)  32 - 45 mmHg Final   Bicarbonate 12/30/2022 32.5 (H)  20.0 - 28.0 mmol/L Final  Acid-Base Excess 12/30/2022 6.9 (H)  0.0 - 2.0 mmol/L Final   O2 Saturation 12/30/2022 94.3  % Final   Patient temperature 12/30/2022 37.0   Final   Performed at [facility], [address]  [place], [place] [address]    Allergies: Hydrocodone-acetaminophen and Hydromorphone hcl  Medications:  Facility Ordered Medications  Medication   alum & mag hydroxide-simeth (MAALOX/MYLANTA) 200-200-20 MG/5ML suspension 30 mL   magnesium hydroxide (MILK OF MAGNESIA) suspension 30 mL   traZODone (DESYREL) tablet 50 mg   hydrOXYzine (ATARAX) tablet 25 mg   risperiDONE (RISPERDAL) tablet 1 mg   divalproex (DEPAKOTE ER) 24 hr tablet 500 mg   albuterol (VENTOLIN HFA) 108 (90 Base) MCG/ACT inhaler 2 puff   [START ON 01/31/2023] fluticasone furoate-vilanterol (BREO ELLIPTA) 200-25 MCG/ACT 1 puff   PTA Medications  Medication Sig   Fluticasone-Salmeterol (ADVAIR DISKUS) 250-50 MCG/DOSE AEPB Inhale 1 puff into the lungs every 12 (twelve) hours. 1 puff inhaled twice daily    albuterol-ipratropium (COMBIVENT) 18-103 MCG/ACT inhaler Inhale 2 puffs into the lungs every 6 (six) hours as needed. For shortness of breath    predniSONE (DELTASONE) 20 MG tablet 2 tabs po daily x 4 days   albuterol (PROVENTIL HFA) 108 (90 Base) MCG/ACT inhaler Inhale 2 puffs into the lungs every 4 (four) hours as needed for wheezing or shortness of breath.   predniSONE (DELTASONE) 20 MG tablet 3 tablets daily x 4 days      Medical Decision Making  GAYATRI GRIMSRUD was admitted to [facility] continuous assessment unit for Acute psychosis [facility]), crisis management, and stabilization. Routine labs ordered, which include Lab Orders         CBC with Differential/Platelet         Comprehensive metabolic panel         Hemoglobin A1c         Magnesium         Ethanol         Lipid panel         TSH         Prolactin         Urinalysis, Routine w reflex microscopic -Urine, Clean Catch         Valproic acid level         POC urine preg, ED         POCT Urine Drug Screen - (I-Screen)    Medication Management: Medications started Meds ordered this encounter  Medications   alum & mag  hydroxide-simeth (MAALOX/MYLANTA) 200-200-20 MG/5ML suspension 30 mL   magnesium hydroxide (MILK OF MAGNESIA) suspension 30 mL   traZODone (DESYREL) tablet 50 mg   hydrOXYzine (ATARAX) tablet 25 mg   DISCONTD: OLANZapine zydis (ZYPREXA) disintegrating tablet 10 mg   risperiDONE (RISPERDAL) tablet 1 mg   divalproex (DEPAKOTE ER) 24 hr tablet 500 mg   albuterol (VENTOLIN HFA) 108 (90 Base) MCG/ACT inhaler 2 puff   fluticasone furoate-vilanterol (BREO ELLIPTA) 200-25 MCG/ACT 1 puff    Will maintain observation checks every 15 minutes for safety. Psychosocial education regarding relapse prevention and self-care; social and communication    Recommendations  Based on my evaluation the patient does not appear to have an emergency medical condition.  -Recommend inpatient psychiatric treatment; per Rona Ravens, [facility] is reviewing -Restart Risperdal 1mg  at bedtime -Restart Depakote ER 500mg  daily  Sunday Corn, NP 01/30/23  8:07 PM
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
## 2023-01-30 NOTE — ED Notes (Signed)
Pt unable to produce urine at this time.  Fluids have been offered and pt has UDS cup at bedside.

## 2023-01-31 ENCOUNTER — Inpatient Hospital Stay: Payer: Self-pay

## 2023-01-31 ENCOUNTER — Other Ambulatory Visit: Payer: Self-pay

## 2023-01-31 ENCOUNTER — Encounter: Payer: Self-pay | Admitting: Behavioral Health

## 2023-01-31 ENCOUNTER — Inpatient Hospital Stay
Admission: AD | Admit: 2023-01-31 | Discharge: 2023-02-08 | DRG: 885 | Disposition: A | Discharge status: Home / Self Care | Payer: No Payment, Other | Attending: Psychiatry | Admitting: Psychiatry

## 2023-01-31 DIAGNOSIS — F17210 Nicotine dependence, cigarettes, uncomplicated: Secondary | ICD-10-CM | POA: Diagnosis present

## 2023-01-31 DIAGNOSIS — K623 Rectal prolapse: Secondary | ICD-10-CM | POA: Diagnosis present

## 2023-01-31 DIAGNOSIS — F23 Brief psychotic disorder: Secondary | ICD-10-CM | POA: Diagnosis present

## 2023-01-31 DIAGNOSIS — Z5901 Sheltered homelessness: Secondary | ICD-10-CM | POA: Diagnosis not present

## 2023-01-31 DIAGNOSIS — F312 Bipolar disorder, current episode manic severe with psychotic features: Secondary | ICD-10-CM | POA: Diagnosis present

## 2023-01-31 DIAGNOSIS — G8929 Other chronic pain: Secondary | ICD-10-CM | POA: Diagnosis present

## 2023-01-31 DIAGNOSIS — F419 Anxiety disorder, unspecified: Secondary | ICD-10-CM | POA: Diagnosis present

## 2023-01-31 DIAGNOSIS — J4489 Other specified chronic obstructive pulmonary disease: Secondary | ICD-10-CM | POA: Diagnosis present

## 2023-01-31 DIAGNOSIS — F1410 Cocaine abuse, uncomplicated: Secondary | ICD-10-CM | POA: Diagnosis present

## 2023-01-31 DIAGNOSIS — Z9049 Acquired absence of other specified parts of digestive tract: Secondary | ICD-10-CM

## 2023-01-31 DIAGNOSIS — F3110 Bipolar disorder, current episode manic without psychotic features, unspecified: Secondary | ICD-10-CM | POA: Diagnosis not present

## 2023-01-31 DIAGNOSIS — J441 Chronic obstructive pulmonary disease with (acute) exacerbation: Secondary | ICD-10-CM | POA: Insufficient documentation

## 2023-01-31 DIAGNOSIS — J449 Chronic obstructive pulmonary disease, unspecified: Secondary | ICD-10-CM | POA: Insufficient documentation

## 2023-01-31 HISTORY — DX: Dyspnea, unspecified: R06.00

## 2023-01-31 LAB — HEMOGLOBIN A1C
Hgb A1c MFr Bld: 6.1 % — ABNORMAL HIGH (ref 4.8–5.6)
Mean Plasma Glucose: 128.37 mg/dL

## 2023-01-31 MED ORDER — FLUTICASONE FUROATE-VILANTEROL 200-25 MCG/ACT IN AEPB
1.0000 | INHALATION_SPRAY | Freq: Every day | RESPIRATORY_TRACT | Status: DC
  Administered 2023-02-01 – 2023-02-08 (×8): 1 via RESPIRATORY_TRACT
  Filled 2023-01-31 (×2): qty 28

## 2023-01-31 MED ORDER — HALOPERIDOL LACTATE 5 MG/ML IJ SOLN: 5.0000 mg | Freq: Three times a day (TID) | INTRAMUSCULAR | Status: DC | PRN

## 2023-01-31 MED ORDER — HYDROXYZINE HCL 25 MG PO TABS
25.0000 mg | ORAL_TABLET | Freq: Three times a day (TID) | ORAL | Status: DC | PRN
  Administered 2023-01-31 – 2023-02-07 (×4): 25 mg via ORAL
  Filled 2023-01-31 (×4): qty 1

## 2023-01-31 MED ORDER — MAGNESIUM HYDROXIDE 400 MG/5ML PO SUSP
30.0000 mL | Freq: Every day | ORAL | Status: DC | PRN
  Administered 2023-02-03 – 2023-02-05 (×2): 30 mL via ORAL
  Filled 2023-01-31 (×2): qty 30

## 2023-01-31 MED ORDER — HALOPERIDOL 5 MG PO TABS: 5.0000 mg | ORAL_TABLET | Freq: Three times a day (TID) | ORAL | Status: DC | PRN

## 2023-01-31 MED ORDER — MAGNESIUM HYDROXIDE 400 MG/5ML PO SUSP
30.0000 mL | Freq: Every day | ORAL | Status: DC | PRN
Start: 2023-01-31 — End: 2023-01-31

## 2023-01-31 MED ORDER — RISPERIDONE 1 MG PO TABS: 1.0000 mg | ORAL_TABLET | Freq: Every day | ORAL | Status: DC

## 2023-01-31 MED ORDER — ALUM & MAG HYDROXIDE-SIMETH 200-200-20 MG/5ML PO SUSP
30.0000 mL | ORAL | Status: DC | PRN
Start: 2023-01-31 — End: 2023-01-31

## 2023-01-31 MED ORDER — POLYETHYLENE GLYCOL 3350 17 G PO PACK
17.0000 g | PACK | Freq: Every day | ORAL | Status: DC
  Filled 2023-01-31: qty 1

## 2023-01-31 MED ORDER — DIVALPROEX SODIUM ER 500 MG PO TB24
500.0000 mg | ORAL_TABLET | Freq: Every day | ORAL | Status: DC
  Administered 2023-02-01 – 2023-02-03 (×3): 500 mg via ORAL
  Filled 2023-01-31 (×3): qty 1

## 2023-01-31 MED ORDER — RISPERIDONE 1 MG PO TABS
2.0000 mg | ORAL_TABLET | Freq: Every day | ORAL | Status: DC
  Administered 2023-01-31 – 2023-02-07 (×7): 2 mg via ORAL
  Filled 2023-01-31 (×7): qty 2

## 2023-01-31 MED ORDER — ALBUTEROL SULFATE HFA 108 (90 BASE) MCG/ACT IN AERS
2.0000 | INHALATION_SPRAY | RESPIRATORY_TRACT | Status: DC | PRN
  Administered 2023-02-01 – 2023-02-07 (×5): 2 via RESPIRATORY_TRACT
  Filled 2023-01-31: qty 6.7

## 2023-01-31 MED ORDER — ENSURE ENLIVE PO LIQD
237.0000 mL | Freq: Two times a day (BID) | ORAL | Status: DC
  Administered 2023-01-31 – 2023-02-08 (×17): 237 mL via ORAL

## 2023-01-31 MED ORDER — ALUM & MAG HYDROXIDE-SIMETH 200-200-20 MG/5ML PO SUSP
30.0000 mL | ORAL | Status: DC | PRN
  Administered 2023-01-31 – 2023-02-07 (×4): 30 mL via ORAL
  Filled 2023-01-31 (×5): qty 30

## 2023-01-31 MED ORDER — NICOTINE 21 MG/24HR TD PT24
21.0000 mg | MEDICATED_PATCH | Freq: Every day | TRANSDERMAL | Status: DC
  Administered 2023-01-31 – 2023-02-08 (×9): 21 mg via TRANSDERMAL
  Filled 2023-01-31 (×8): qty 1

## 2023-01-31 MED ORDER — LORAZEPAM 2 MG PO TABS
2.0000 mg | ORAL_TABLET | Freq: Four times a day (QID) | ORAL | Status: DC | PRN
  Administered 2023-01-31 – 2023-02-07 (×10): 2 mg via ORAL
  Filled 2023-01-31 (×10): qty 1

## 2023-01-31 MED ORDER — HYDROXYZINE HCL 25 MG PO TABS: 25.0000 mg | ORAL_TABLET | Freq: Three times a day (TID) | ORAL | Status: DC | PRN

## 2023-01-31 MED ORDER — TRAZODONE HCL 50 MG PO TABS
50.0000 mg | ORAL_TABLET | Freq: Every evening | ORAL | Status: DC | PRN
  Administered 2023-01-31 – 2023-02-07 (×7): 50 mg via ORAL
  Filled 2023-01-31 (×7): qty 1

## 2023-01-31 MED ORDER — NICOTINE POLACRILEX 2 MG MT GUM
2.0000 mg | CHEWING_GUM | OROMUCOSAL | Status: DC | PRN
  Administered 2023-01-31: 2 mg via ORAL
  Filled 2023-01-31: qty 1

## 2023-01-31 MED ORDER — DOCUSATE SODIUM 100 MG PO CAPS
100.0000 mg | ORAL_CAPSULE | Freq: Two times a day (BID) | ORAL | Status: DC
  Filled 2023-01-31: qty 1

## 2023-01-31 NOTE — Discharge Instructions (Signed)
Transfer to [facility] 
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
## 2023-01-31 NOTE — ED Notes (Signed)
Pt very labile & irritable, no distress noted.  Monitoring for safety.

## 2023-01-31 NOTE — Progress Notes (Addendum)
Pt reports having cell phone and ''breathing machine for my copd'' in personal belongings . Writer called [facility] and informed by staff that all items sent with patient. Pt also able to see no cell phone or ''breathing machine '' in current items listed in belongings on unit with [facility] and this Clinical research associate.  Pt informed awaiting fax from [facility] regarding listed items secured from patient.  1550- Received fax from [facility] indicating no phone present. Also no ''breathing machine '' listed either. Pt informed of above however remains religiously preoccupied that '' the devil is upon her and she knows what she had'' Will con't to monitor.

## 2023-01-31 NOTE — Group Note (Signed)
[facility] LCSW Group Therapy Note   Group Date: 01/31/2023 Start Time: 1310 End Time: 1400   Type of Therapy/Topic:  Group Therapy:  Balance in Life  Participation Level:  Did Not Attend   Description of Group:    This group will address the concept of balance and how it feels and looks when one is unbalanced. Patients will be encouraged to process areas in their lives that are out of balance, and identify reasons for remaining unbalanced. Facilitators will guide patients utilizing problem- solving interventions to address and correct the stressor making their life unbalanced. Understanding and applying boundaries will be explored and addressed for obtaining  and maintaining a balanced life. Patients will be encouraged to explore ways to assertively make their unbalanced needs known to significant others in their lives, using other group members and facilitator for support and feedback.  Therapeutic Goals: Patient will identify two or more emotions or situations they have that consume much of in their lives. Patient will identify signs/triggers that life has become out of balance:  Patient will identify two ways to set boundaries in order to achieve balance in their lives:  Patient will demonstrate ability to communicate their needs through discussion and/or role plays  Summary of Patient Progress: Pt declined to attend group despite encouragement from this CSW.    Therapeutic Modalities:   Cognitive Behavioral Therapy Solution-Focused Therapy Assertiveness Training   Lucila Klecka R Yan Pankratz, LCSW 

## 2023-01-31 NOTE — ED Notes (Signed)
Pt sleeping at present, no distress noted.  Monitoring for safety. 

## 2023-01-31 NOTE — ED Notes (Addendum)
Report called to RN [facility].  Librarian, academic.
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
## 2023-01-31 NOTE — ED Notes (Signed)
Patient alert and oriented. Pt is labile. Denies SI, HI, AVH, and pain.  Scheduled medications administered to patient, per MD orders.  Pt did refuse zyprexa last night, stating she does not take that specific medication.  Support and encouragement provided.  Routine safety checks conducted every hour.  Patient informed to notify staff with problems or concerns. No adverse drug reactions noted. Patient contracts for safety at this time. Patient compliant with treatment plan. Patient receptive, calm, and cooperative. Patient interacts well with others on the unit.  Patient remains safe at this time.

## 2023-01-31 NOTE — Progress Notes (Signed)
Pt meets inpatient behavioral health placement per Erskine Emery, NP. CSW requested Night [facility] Fransico Michael, RN to review. 1st shift Disposition CSW to follow.   Maryjean Ka, MSW, LCSWA 01/31/2023 1:44 AM

## 2023-01-31 NOTE — Progress Notes (Signed)
Patient presents disorganized but pleasant and cooperative on the unit. Pt observed interacting appropriately with staff and peers on the unit. Pt compliant with medication administration per MD orders. Pt given education, support, and encouragement to be active in her treatment plan. Pt being monitored Q 15 minutes for safety per unit protocol, remains safe on the unit

## 2023-01-31 NOTE — ED Notes (Signed)
Pt to be transferred to [facility]/BMU RM 312 on Dayshift after 8am.

## 2023-01-31 NOTE — ED Notes (Signed)
Safe Transport requested.to [facility]. 

## 2023-01-31 NOTE — ED Notes (Signed)
Voluntary Form faxed to [facility].

## 2023-01-31 NOTE — Progress Notes (Signed)
Patient ID: Stacy Bolton, female   DOB: September 28, 1970, 52 y.o.   MRN: [id number] Patient admitted voluntarily from [facility] to [facility]. Patient upon admission is extremely labile, disorganized and difficult to redirect. When attempting to complete skin search patient becomes argumentative and states '' I'm not fucking going to prison I'm the one who signed myself in for some help. I've been traumatized and I need a moment to sit down and think . ''  Patient was carrying her belongings and digging into purse and perseverating on having her lotion and looking for her phone, demanding that staff call her phone before she could continue with admission process. When attempting to complete admission questions the patient becomes agitated, closing her eyes and lowering her head as if she were sleeping and then states '' I don't fucking care what you do!''  Ultimately, after much time and verbal redirection the patient allowed staff to complete skin assessment. She refused to sign any admission paperwork and due to agitation was redirected verbally to her room. She was offered prn for agitation but refused, stating '' I take xanax '' did accept prn vistaril. MD notified of above information due to patients current condition.  Patient oriented to unit. Given snack and po fluids. She denies any SI HI but thought content during admission disorganized with hyper religious themes as patient states ''I've been through something and I hear god's voice but that doesn't make me crazy he is here to help me.''  Pt is safe, no skin issues noted on admission. Will con't to monitor.

## 2023-02-01 MED ORDER — TRAMADOL HCL 50 MG PO TABS
50.0000 mg | ORAL_TABLET | Freq: Four times a day (QID) | ORAL | Status: DC | PRN
  Administered 2023-02-01 – 2023-02-07 (×14): 50 mg via ORAL
  Filled 2023-02-01 (×15): qty 1

## 2023-02-01 MED ORDER — ADULT MULTIVITAMIN W/MINERALS CH
1.0000 | ORAL_TABLET | Freq: Every day | ORAL | Status: DC
  Administered 2023-02-02 – 2023-02-08 (×7): 1 via ORAL
  Filled 2023-02-01 (×7): qty 1

## 2023-02-01 MED ORDER — PSYLLIUM 95 % PO PACK
1.0000 | PACK | Freq: Every day | ORAL | Status: DC
  Administered 2023-02-01 – 2023-02-08 (×7): 1 via ORAL
  Filled 2023-02-01 (×8): qty 1

## 2023-02-01 NOTE — Plan of Care (Signed)
Patient pleasant and cooperative on approach. Patient thanked staff for being awesome and caring. Patient talks about her rectocele sates " that worries me. That's my real problem. I am dealing with this for a year now." Patient tearful and c/o pain.Tramadol given for pain on lower abdomen and back. Staff looked at the rectocele. Its a bulge of red tissue visible out at the rectum.Made MD aware and OBGYN consulted. Patient denies SI,HI and AVH. Appetite and energy fair. Support and encouragement given.

## 2023-02-01 NOTE — Group Note (Signed)
Recreation Therapy Group Note   Group Topic:Leisure Education  Group Date: 02/01/2023 Start Time: 1000 End Time: 1100 Facilitators: Rosina Lowenstein, LRT, CTRS Location:  Dayroom  Group Description: Leisure. Patients were given the option to choose from coloring mandalas, singing karaoke, playing cards, or making origami. LRT and pts discussed the meaning of leisure, the importance of participating in leisure during their free time/when they're outside of the hospital, as well as how our leisure interests can also serve as coping skills. Pt identified two leisure interests and shared with the group.   Goal Area(s) Addressed:  Patient will identify a current leisure interest.  Patient will learn the definition of "leisure". Patient will practice making a positive decision. Patient will have the opportunity to try a new leisure activity. Patient will communicate with peers and LRT.   Affect/Mood: N/A   Participation Level: Did not attend    Clinical Observations/Individualized Feedback: Stacy Bolton did not attend group due to resting in her room.  Plan: Continue to engage patient in RT group sessions 2-3x/week.   Rosina Lowenstein, LRT, CTRS 02/01/2023 11:24 AM
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
## 2023-02-01 NOTE — Group Note (Signed)
[facility] LCSW Group Therapy Note   Group Date: 02/01/2023 Start Time: 1320 End Time: 1410   Type of Therapy/Topic:  Group Therapy:  Balance in Life  Participation Level:  Did Not Attend   Description of Group:    This group will address the concept of balance and how it feels and looks when one is unbalanced. Patients will be encouraged to process areas in their lives that are out of balance, and identify reasons for remaining unbalanced. Facilitators will guide patients utilizing problem- solving interventions to address and correct the stressor making their life unbalanced. Understanding and applying boundaries will be explored and addressed for obtaining  and maintaining a balanced life. Patients will be encouraged to explore ways to assertively make their unbalanced needs known to significant others in their lives, using other group members and facilitator for support and feedback.  Therapeutic Goals: Patient will identify two or more emotions or situations they have that consume much of in their lives. Patient will identify signs/triggers that life has become out of balance:  Patient will identify two ways to set boundaries in order to achieve balance in their lives:  Patient will demonstrate ability to communicate their needs through discussion and/or role plays  Summary of Patient Progress: X   Therapeutic Modalities:   Cognitive Behavioral Therapy Solution-Focused Therapy Assertiveness Training   Glenis Smoker, LCSW

## 2023-02-01 NOTE — Progress Notes (Signed)
[facility] MD Progress Note  02/01/2023 11:22 AM Stacy Bolton  MRN:  [id number] Subjective: Patient seen.  Chart reviewed.  I went back and read more in her old chart in some detail.  Patient attended treatment team as well.  Mentally patient is anxious but not at the moment appearing to be as agitated or confused as yesterday.  No obviously psychotic statements.  Able to have a more lucid back-and-forth conversation.  Mainly still focused on physical complaints mostly her rectocele.  Also continues to complain of coughing and having sputum production.  Chest x-ray reviewed just showed COPD no infiltrate.  Vital signs stable no fever.  Looking back in her chart I discovered that her social situation has been difficult much longer than we would think.  It sounds like even in 2011 she had a bad social situation and had a well-established history of substance use problems. Principal Problem: Bipolar I disorder, most recent episode (or current) manic (HCC) Diagnosis: Principal Problem:   Bipolar I disorder, most recent episode (or current) manic (HCC) Active Problems:   COPD (chronic obstructive pulmonary disease) (HCC)   Cocaine abuse (HCC)  Total Time spent with patient: 30 minutes  Past Psychiatric History: Past history of substance abuse problems bipolar disorder but unclear actual mental health care.  Past Medical History:  Past Medical History:  Diagnosis Date   Anxiety    Asthma    COPD (chronic obstructive pulmonary disease) (HCC)    Dyspnea    Endometriosis    Seizure (HCC)     Past Surgical History:  Procedure Laterality Date   CHOLECYSTECTOMY     Family History: History reviewed. No pertinent family history. Family Psychiatric  History: See previous Social History:  Social History   Substance and Sexual Activity  Alcohol Use Not Currently     Social History   Substance and Sexual Activity  Drug Use Yes   Types: Cocaine, Marijuana   Comment: EVERY THREE DAYS     Social History   Socioeconomic History   Marital status: Single    Spouse name: Not on file   Number of children: Not on file   Years of education: Not on file   Highest education level: Not on file  Occupational History   Not on file  Tobacco Use   Smoking status: Every Day    Packs/day: 1.50    Years: 30.00    Additional pack years: 0.00    Total pack years: 45.00    Types: Cigarettes   Smokeless tobacco: Not on file  Vaping Use   Vaping Use: Never used  Substance and Sexual Activity   Alcohol use: Not Currently   Drug use: Yes    Types: Cocaine, Marijuana    Comment: EVERY THREE DAYS   Sexual activity: Not Currently  Other Topics Concern   Not on file  Social History Narrative   Not on file   Social Determinants of Health   Financial Resource Strain: Not on file  Food Insecurity: No Food Insecurity (01/31/2023)   Hunger Vital Sign    Worried About Running Out of Food in the Last Year: Never true    Ran Out of Food in the Last Year: Never true  Transportation Needs: No Transportation Needs (01/31/2023)   PRAPARE - Administrator, Civil Service (Medical): No    Lack of Transportation (Non-Medical): No  Physical Activity: Not on file  Stress: Not on file  Social Connections: Not on file  Additional Social History:                         Sleep: Fair  Appetite:  Fair  Current Medications: Current Facility-Administered Medications  Medication Dose Route Frequency Provider Last Rate Last Admin   albuterol (VENTOLIN HFA) 108 (90 Base) MCG/ACT inhaler 2 puff  2 puff Inhalation Q4H PRN Bing Neighbors, NP   2 puff at 02/01/23 0902   alum & mag hydroxide-simeth (MAALOX/MYLANTA) 200-200-20 MG/5ML suspension 30 mL  30 mL Oral Q4H PRN Onuoha, Chinwendu V, NP   30 mL at 01/31/23 1750   divalproex (DEPAKOTE ER) 24 hr tablet 500 mg  500 mg Oral Daily Bing Neighbors, NP   500 mg at 02/01/23 0900   feeding supplement (ENSURE ENLIVE / ENSURE PLUS)  liquid 237 mL  237 mL Oral BID BM Sharee Sturdy T, MD   237 mL at 01/31/23 1409   fluticasone furoate-vilanterol (BREO ELLIPTA) 200-25 MCG/ACT 1 puff  1 puff Inhalation Daily Bing Neighbors, NP       haloperidol (HALDOL) tablet 5 mg  5 mg Oral TID PRN Onuoha, Chinwendu V, NP       Or   haloperidol lactate (HALDOL) injection 5 mg  5 mg Intramuscular TID PRN Onuoha, Chinwendu V, NP       hydrOXYzine (ATARAX) tablet 25 mg  25 mg Oral TID PRN Bing Neighbors, NP   25 mg at 01/31/23 1005   LORazepam (ATIVAN) tablet 2 mg  2 mg Oral Q6H PRN Donaldo Teegarden, Jackquline Denmark, MD   2 mg at 01/31/23 1750   magnesium hydroxide (MILK OF MAGNESIA) suspension 30 mL  30 mL Oral Daily PRN Onuoha, Chinwendu V, NP       nicotine (NICODERM CQ - dosed in mg/24 hours) patch 21 mg  21 mg Transdermal Daily Ludean Duhart T, MD   21 mg at 02/01/23 0900   nicotine polacrilex (NICORETTE) gum 2 mg  2 mg Oral PRN Jaison Petraglia, Jackquline Denmark, MD   2 mg at 01/31/23 1408   psyllium (HYDROCIL/METAMUCIL) 1 packet  1 packet Oral Daily Leetta Hendriks, Jackquline Denmark, MD       risperiDONE (RISPERDAL) tablet 2 mg  2 mg Oral QHS Taylin Mans T, MD   2 mg at 01/31/23 2104   traZODone (DESYREL) tablet 50 mg  50 mg Oral QHS PRN Bing Neighbors, NP   50 mg at 01/31/23 2104    Lab Results:  Results for orders placed or performed during the hospital encounter of 01/30/23 (from the past 48 hour(s))  CBC with Differential/Platelet     Status: None   Collection Time: 01/30/23  5:41 PM  Result Value Ref Range   WBC 7.4 4.0 - 10.5 K/uL   RBC 4.49 3.87 - 5.11 MIL/uL   Hemoglobin 13.9 12.0 - 15.0 g/dL   HCT 16.1 09.6 - 04.5 %   MCV 94.9 80.0 - 100.0 fL   MCH 31.0 26.0 - 34.0 pg   MCHC 32.6 30.0 - 36.0 g/dL   RDW 40.9 81.1 - 91.4 %   Platelets 330 150 - 400 K/uL   nRBC 0.0 0.0 - 0.2 %   Neutrophils Relative % 52 %   Neutro Abs 3.9 1.7 - 7.7 K/uL   Lymphocytes Relative 31 %   Lymphs Abs 2.3 0.7 - 4.0 K/uL   Monocytes Relative 13 %   Monocytes Absolute 0.9 0.1 - 1.0  K/uL   Eosinophils Relative  2 %   Eosinophils Absolute 0.1 0.0 - 0.5 K/uL   Basophils Relative 1 %   Basophils Absolute 0.1 0.0 - 0.1 K/uL   Immature Granulocytes 1 %   Abs Immature Granulocytes 0.04 0.00 - 0.07 K/uL    Comment: Performed at [facility], [address] [place], [place] [address]  Comprehensive metabolic panel     Status: Abnormal   Collection Time: 01/30/23  5:41 PM  Result Value Ref Range   Sodium 134 (L) 135 - 145 mmol/L   Potassium 3.7 3.5 - 5.1 mmol/L   Chloride 95 (L) 98 - 111 mmol/L   CO2 29 22 - 32 mmol/L   Glucose, Bld 120 (H) 70 - 99 mg/dL    Comment: Glucose reference range applies only to samples taken after fasting for at least 8 hours.   BUN 16 6 - 20 mg/dL   Creatinine, Ser 8.29 0.44 - 1.00 mg/dL   Calcium 8.7 (L) 8.9 - 10.3 mg/dL   Total Protein 6.1 (L) 6.5 - 8.1 g/dL   Albumin 3.5 3.5 - 5.0 g/dL   AST 23 15 - 41 U/L   ALT 24 0 - 44 U/L   Alkaline Phosphatase 83 38 - 126 U/L   Total Bilirubin 0.9 0.3 - 1.2 mg/dL   GFR, Estimated >56 >21 mL/min    Comment: (NOTE) Calculated using the CKD-EPI Creatinine Equation (2021)    Anion gap 10 5 - 15    Comment: Performed at [facility], [address] [place], [place] [address]  Hemoglobin A1c     Status: Abnormal   Collection Time: 01/30/23  5:41 PM  Result Value Ref Range   Hgb A1c MFr Bld 6.1 (H) 4.8 - 5.6 %    Comment: (NOTE) Pre diabetes:          5.7%-6.4%  Diabetes:              >6.4%  Glycemic control for   <7.0% adults with diabetes    Mean Plasma Glucose 128.37 mg/dL    Comment: Performed at [facility], [address], [place], [place] [address]  Magnesium     Status: None   Collection Time: 01/30/23  5:41 PM  Result Value Ref Range   Magnesium 2.3 1.7 - 2.4 mg/dL    Comment: Performed at [facility], [address], [place], [place] [address]  Ethanol     Status: None   Collection Time: 01/30/23  5:41 PM  Result Value Ref Range   Alcohol, Ethyl (B)  <10 <10 mg/dL    Comment: (NOTE) Lowest detectable limit for serum alcohol is 10 mg/dL.  For medical purposes only. Performed at [facility], [address] [place], [place] [address]   Lipid panel     Status: None   Collection Time: 01/30/23  5:41 PM  Result Value Ref Range   Cholesterol 165 0 - 200 mg/dL   Triglycerides 58 <440 mg/dL   HDL 62 >10 mg/dL   Total CHOL/HDL Ratio 2.7 RATIO   VLDL 12 0 - 40 mg/dL   LDL Cholesterol 91 0 - 99 mg/dL    Comment:        Total Cholesterol/HDL:CHD Risk Coronary Heart Disease Risk Table                     Men   Women  1/2 Average Risk   3.4   3.3  Average  Risk       5.0   4.4  2 X Average Risk   9.6   7.1  3 X Average Risk  23.4   11.0        Use the calculated Patient Ratio above and the CHD Risk Table to determine the patient's CHD Risk.        ATP III CLASSIFICATION (LDL):  <100     mg/dL   Optimal  161-096  mg/dL   Near or Above                    Optimal  130-159  mg/dL   Borderline  045-409  mg/dL   High  >811     mg/dL   Very High Performed at [facility], [address], [place], [place] [address]   TSH     Status: Abnormal   Collection Time: 01/30/23  5:41 PM  Result Value Ref Range   TSH 4.773 (H) 0.350 - 4.500 uIU/mL    Comment: Performed by a 3rd Generation assay with a functional sensitivity of <=0.01 uIU/mL. Performed at [facility], [address] [place], [place] [address]   Valproic acid level     Status: Abnormal   Collection Time: 01/30/23  5:41 PM  Result Value Ref Range   Valproic Acid Lvl <10 (L) 50.0 - 100.0 ug/mL    Comment: RESULT CONFIRMED BY MANUAL DILUTION Performed at [facility], [address], [place], [place] [address]   POCT Urine Drug Screen - (I-Screen)     Status: Abnormal   Collection Time: 01/30/23  6:56 PM  Result Value Ref Range   POC Amphetamine UR None Detected NONE DETECTED (Cut Off Level 1000 ng/mL)   POC Secobarbital (BAR) None Detected NONE  DETECTED (Cut Off Level 300 ng/mL)   POC Buprenorphine (BUP) None Detected NONE DETECTED (Cut Off Level 10 ng/mL)   POC Oxazepam (BZO) None Detected NONE DETECTED (Cut Off Level 300 ng/mL)   POC Cocaine UR Positive (A) NONE DETECTED (Cut Off Level 300 ng/mL)   POC Methamphetamine UR None Detected NONE DETECTED (Cut Off Level 1000 ng/mL)   POC Morphine None Detected NONE DETECTED (Cut Off Level 300 ng/mL)   POC Methadone UR None Detected NONE DETECTED (Cut Off Level 300 ng/mL)   POC Oxycodone UR None Detected NONE DETECTED (Cut Off Level 100 ng/mL)   POC Marijuana UR Positive (A) NONE DETECTED (Cut Off Level 50 ng/mL)  POC urine preg, ED     Status: Normal   Collection Time: 01/30/23  6:56 PM  Result Value Ref Range   Preg Test, Ur Negative Negative  Urinalysis, Routine w reflex microscopic -Urine, Clean Catch     Status: Abnormal   Collection Time: 01/30/23  6:57 PM  Result Value Ref Range   Color, Urine YELLOW YELLOW   APPearance HAZY (A) CLEAR   Specific Gravity, Urine 1.010 1.005 - 1.030   pH 6.0 5.0 - 8.0   Glucose, UA NEGATIVE NEGATIVE mg/dL   Hgb urine dipstick NEGATIVE NEGATIVE   Bilirubin Urine NEGATIVE NEGATIVE   Ketones, ur NEGATIVE NEGATIVE mg/dL   Protein, ur NEGATIVE NEGATIVE mg/dL   Nitrite NEGATIVE NEGATIVE   Leukocytes,Ua NEGATIVE NEGATIVE    Comment: Performed at [facility], [address], [place], [place] [address]    Blood Alcohol level:  Lab Results  Component Value Date   [facility] <10 01/30/2023   [facility]  03/19/2010    <  5        LOWEST DETECTABLE LIMIT FOR SERUM ALCOHOL IS 5 mg/dL FOR MEDICAL PURPOSES ONLY    Metabolic Disorder Labs: Lab Results  Component Value Date   HGBA1C 6.1 (H) 01/30/2023   MPG 128.37 01/30/2023   MPG 108 03/19/2010   No results found for: "PROLACTIN" Lab Results  Component Value Date   CHOL 165 01/30/2023   TRIG 58 01/30/2023   HDL 62 01/30/2023   CHOLHDL 2.7 01/30/2023   VLDL 12 01/30/2023   LDLCALC 91 01/30/2023    LDLCALC  03/20/2010    38        Total Cholesterol/HDL:CHD Risk Coronary Heart Disease Risk Table                     Men   Women  1/2 Average Risk   3.4   3.3  Average Risk       5.0   4.4  2 X Average Risk   9.6   7.1  3 X Average Risk  23.4   11.0        Use the calculated Patient Ratio above and the CHD Risk Table to determine the patient's CHD Risk.        ATP III CLASSIFICATION (LDL):  <100     mg/dL   Optimal  161-096  mg/dL   Near or Above                    Optimal  130-159  mg/dL   Borderline  045-409  mg/dL   High  >811     mg/dL   Very High    Physical Findings: AIMS:  , ,  ,  ,    CIWA:    COWS:     Musculoskeletal: Strength & Muscle Tone: within normal limits Gait & Station: normal Patient leans: N/A  Psychiatric Specialty Exam:  Presentation  General Appearance:  Appropriate for Environment; Casual  Eye Contact: Good  Speech: Clear and Coherent; Normal Rate  Speech Volume: Normal  Handedness: Right   Mood and Affect  Mood: Anxious; Depressed; Labile  Affect: Congruent; Tearful; Labile   Thought Process  Thought Processes: Disorganized  Descriptions of Associations:Tangential  Orientation:Full (Time, Place and Person)  Thought Content:Paranoid Ideation; Tangential  History of Schizophrenia/Schizoaffective disorder:No (none reported)  Duration of Psychotic Symptoms:-- (unknown- could not be assessed due to pt's emotional state)  Hallucinations:No data recorded Ideas of Reference:Paranoia  Suicidal Thoughts:No data recorded Homicidal Thoughts:No data recorded  Sensorium  Memory: Immediate Fair; Recent Fair; Remote Fair  Judgment: Fair  Insight: Fair   Art therapist  Concentration: Fair  Attention Span: Fair  Recall: Fair  Fund of Knowledge: Good  Language: Good   Psychomotor Activity  Psychomotor Activity:No data recorded  Assets  Assets: Communication Skills; Desire for Improvement;  Physical Health; Resilience   Sleep  Sleep:No data recorded   Physical Exam: Physical Exam Vitals and nursing note reviewed.  Constitutional:      Appearance: Normal appearance.  HENT:     Head: Normocephalic and atraumatic.     Mouth/Throat:     Pharynx: Oropharynx is clear.  Eyes:     Pupils: Pupils are equal, round, and reactive to light.  Cardiovascular:     Rate and Rhythm: Normal rate and regular rhythm.  Pulmonary:     Effort: Pulmonary effort is normal.     Breath sounds: Normal breath sounds.  Abdominal:     General: Abdomen  is flat.     Palpations: Abdomen is soft.  Musculoskeletal:        General: Normal range of motion.  Skin:    General: Skin is warm and dry.  Neurological:     General: No focal deficit present.     Mental Status: She is alert. Mental status is at baseline.  Psychiatric:        Attention and Perception: Attention normal.        Mood and Affect: Mood is anxious.        Speech: Speech is rapid and pressured.        Behavior: Behavior is cooperative.        Thought Content: Thought content normal.        Cognition and Memory: Memory is impaired.    Review of Systems  Constitutional: Negative.   HENT: Negative.    Eyes: Negative.   Respiratory: Negative.    Cardiovascular: Negative.   Gastrointestinal:        Patient complains of protruding rectocele which is painful  Musculoskeletal: Negative.   Skin: Negative.   Neurological: Negative.   Psychiatric/Behavioral:  Negative for depression, hallucinations, substance abuse and suicidal ideas. The patient is nervous/anxious.    Blood pressure 107/74, pulse (!) 102, temperature 98.4 F (36.9 C), temperature source Oral, resp. rate 20, height 5\' 5"  (1.651 m), weight 56.7 kg, last menstrual period 10/13/2011, SpO2 95 %. Body mass index is 20.8 kg/m.   Treatment Plan Summary: Medication management and Plan the patient's chief complaint is her rectocele.  She is really not complaining of  any mental health issues.  She is no longer obviously psychotic and there is no suicidal ideation.  A big challenge is going to be finding a place for her to go.  This seems to have been an issue that goes back years.  In the meantime I will consult surgery.  I had texted the surgeon yesterday just for recommendations but the patient's complaints have escalated today and it turns out she does not actually have any ongoing treatment and so I will see if the surgery team can see her.  No change to mental health treatment for the moment.  Continues to complain of COPD symptoms.  No obvious infection I would very much hope that we can control her COPD without steroids as I am pretty certain that that was the trigger for her current mental health issue as much as the substance use was  Mordecai Rasmussen, MD 02/01/2023, 11:22 AM

## 2023-02-01 NOTE — Progress Notes (Signed)
Initial Nutrition Assessment  DOCUMENTATION CODES:   Not applicable  INTERVENTION:   Ensure Enlive po BID, each supplement provides 350 kcal and 20 grams of protein.  MVI po daily   NUTRITION DIAGNOSIS:   Predicted suboptimal nutrient intake related to social / environmental circumstances as evidenced by per patient/family report.  GOAL:   Patient will meet greater than or equal to 90% of their needs  MONITOR:   PO intake, Supplement acceptance  REASON FOR ASSESSMENT:   Malnutrition Screening Tool    ASSESSMENT:   52 y.o. female patient with a past psychiatric history of COPD, substance abuse, anxiety, seizures, paranoid schizophrenia, bipolar disorder, PTSD and depression who presented to [facility] voluntarily and unaccompanied via [employer].  Per chart review, pt reports decreased oral intake and wt loss pta. Per chart, pt does appear to have weighed ~200lbs back in 2016. There is no documented weight history in chart to determine if any significant recent weight changes. Pt does appear weight stable from her last weight from May. RD will add supplements and MVI to help pt meet her estimated needs.   Medications reviewed and include: nicotine, metamucil  Labs reviewed:  Diet Order:   Diet Order             Diet regular Room service appropriate? Yes; Fluid consistency: Thin  Diet effective now                  EDUCATION NEEDS:   No education needs have been identified at this time  Skin:  Skin Assessment: Reviewed RN Assessment  Last BM:  unknown  Height:   Ht Readings from Last 1 Encounters:  01/31/23 5\' 5"  (1.651 m)    Weight:   Wt Readings from Last 1 Encounters:  01/31/23 56.7 kg    BMI:  Body mass index is 20.8 kg/m.  Estimated Nutritional Needs:   Kcal:  1500-1700kcal/day  Protein:  75-85g/day  Fluid:  1.7-1.9L/day  Betsey Holiday MS, RD, LDN Please refer to [facility] for RD and/or RD on-call/weekend/after hours pager

## 2023-02-01 NOTE — Group Note (Signed)
Date:  02/01/2023 Time:  2:55 AM  Group Topic/Focus:  Wrap-Up Group:   The focus of this group is to help patients review their daily goal of treatment and discuss progress on daily workbooks.    Participation Level:  Active  Participation Quality:  Appropriate and Attentive  Affect:  Appropriate  Cognitive:  Alert and Appropriate  Insight: Good  Engagement in Group:  Developing/Improving and Engaged  Modes of Intervention:  Support  Additional Comments:     Stacy Bolton 02/01/2023, 2:55 AM

## 2023-02-01 NOTE — BH IP Treatment Plan (Signed)
Interdisciplinary Treatment and Diagnostic Plan Update  02/01/2023 Time of Session: 9:15 AM Stacy Bolton MRN: [id number]  Principal Diagnosis: Bipolar I disorder, most recent episode (or current) manic (HCC)  Secondary Diagnoses: Principal Problem:   Bipolar I disorder, most recent episode (or current) manic (HCC) Active Problems:   COPD (chronic obstructive pulmonary disease) (HCC)   Cocaine abuse (HCC)   Current Medications:  Current Facility-Administered Medications  Medication Dose Route Frequency Provider Last Rate Last Admin   albuterol (VENTOLIN HFA) 108 (90 Base) MCG/ACT inhaler 2 puff  2 puff Inhalation Q4H PRN Bing Neighbors, NP   2 puff at 02/01/23 0902   alum & mag hydroxide-simeth (MAALOX/MYLANTA) 200-200-20 MG/5ML suspension 30 mL  30 mL Oral Q4H PRN Onuoha, Chinwendu V, NP   30 mL at 01/31/23 1750   divalproex (DEPAKOTE ER) 24 hr tablet 500 mg  500 mg Oral Daily Bing Neighbors, NP   500 mg at 02/01/23 0900   feeding supplement (ENSURE ENLIVE / ENSURE PLUS) liquid 237 mL  237 mL Oral BID BM Clapacs, John T, MD   237 mL at 01/31/23 1409   fluticasone furoate-vilanterol (BREO ELLIPTA) 200-25 MCG/ACT 1 puff  1 puff Inhalation Daily Bing Neighbors, NP       haloperidol (HALDOL) tablet 5 mg  5 mg Oral TID PRN Onuoha, Chinwendu V, NP       Or   haloperidol lactate (HALDOL) injection 5 mg  5 mg Intramuscular TID PRN Onuoha, Chinwendu V, NP       hydrOXYzine (ATARAX) tablet 25 mg  25 mg Oral TID PRN Bing Neighbors, NP   25 mg at 01/31/23 1005   LORazepam (ATIVAN) tablet 2 mg  2 mg Oral Q6H PRN Clapacs, Jackquline Denmark, MD   2 mg at 01/31/23 1750   magnesium hydroxide (MILK OF MAGNESIA) suspension 30 mL  30 mL Oral Daily PRN Onuoha, Chinwendu V, NP       nicotine (NICODERM CQ - dosed in mg/24 hours) patch 21 mg  21 mg Transdermal Daily Clapacs, John T, MD   21 mg at 02/01/23 0900   nicotine polacrilex (NICORETTE) gum 2 mg  2 mg Oral PRN Clapacs, Jackquline Denmark, MD   2 mg at  01/31/23 1408   psyllium (HYDROCIL/METAMUCIL) 1 packet  1 packet Oral Daily Clapacs, John T, MD       risperiDONE (RISPERDAL) tablet 2 mg  2 mg Oral QHS Clapacs, John T, MD   2 mg at 01/31/23 2104   traZODone (DESYREL) tablet 50 mg  50 mg Oral QHS PRN Bing Neighbors, NP   50 mg at 01/31/23 2104   PTA Medications: Medications Prior to Admission  Medication Sig Dispense Refill Last Dose   albuterol (PROVENTIL HFA) 108 (90 Base) MCG/ACT inhaler Inhale 2 puffs into the lungs every 4 (four) hours as needed for wheezing or shortness of breath. 1 each 0    albuterol-ipratropium (COMBIVENT) 18-103 MCG/ACT inhaler Inhale 2 puffs into the lungs every 6 (six) hours as needed. For shortness of breath       budesonide (PULMICORT) 0.5 MG/2ML nebulizer solution Take 0.5 mg by nebulization 2 (two) times daily. From hospital stay in May 2024      divalproex (DEPAKOTE ER) 500 MG 24 hr tablet Take 500 mg by mouth daily.      Fluticasone-Salmeterol (ADVAIR DISKUS) 250-50 MCG/DOSE AEPB Inhale 1 puff into the lungs every 12 (twelve) hours. 1 puff inhaled twice daily  (Patient not  taking: Reported on 01/31/2023)      gabapentin (NEURONTIN) 100 MG capsule Take 100 mg by mouth 3 (three) times daily.      pantoprazole (PROTONIX) 40 MG tablet Take 40 mg by mouth daily.      risperiDONE (RISPERDAL) 1 MG tablet Take 1 mg by mouth at bedtime.       Patient Stressors:    Patient Strengths:    Treatment Modalities: Medication Management, Group therapy, Case management,  1 to 1 session with clinician, Psychoeducation, Recreational therapy.   Physician Treatment Plan for Primary Diagnosis: Bipolar I disorder, most recent episode (or current) manic (HCC) Long Term Goal(s): Improvement in symptoms so as ready for discharge   Short Term Goals: Ability to maintain clinical measurements within normal limits will improve Compliance with prescribed medications will improve Ability to verbalize feelings will improve Ability  to demonstrate self-control will improve Ability to identify and develop effective coping behaviors will improve  Medication Management: Evaluate patient's response, side effects, and tolerance of medication regimen.  Therapeutic Interventions: 1 to 1 sessions, Unit Group sessions and Medication administration.  Evaluation of Outcomes: Not Met  Physician Treatment Plan for Secondary Diagnosis: Principal Problem:   Bipolar I disorder, most recent episode (or current) manic (HCC) Active Problems:   COPD (chronic obstructive pulmonary disease) (HCC)   Cocaine abuse (HCC)  Long Term Goal(s): Improvement in symptoms so as ready for discharge   Short Term Goals: Ability to maintain clinical measurements within normal limits will improve Compliance with prescribed medications will improve Ability to verbalize feelings will improve Ability to demonstrate self-control will improve Ability to identify and develop effective coping behaviors will improve     Medication Management: Evaluate patient's response, side effects, and tolerance of medication regimen.  Therapeutic Interventions: 1 to 1 sessions, Unit Group sessions and Medication administration.  Evaluation of Outcomes: Not Met   RN Treatment Plan for Primary Diagnosis: Bipolar I disorder, most recent episode (or current) manic (HCC) Long Term Goal(s): Knowledge of disease and therapeutic regimen to maintain health will improve  Short Term Goals: Ability to demonstrate self-control, Ability to participate in decision making will improve, Ability to verbalize feelings will improve, Ability to disclose and discuss suicidal ideas, Ability to identify and develop effective coping behaviors will improve, and Compliance with prescribed medications will improve  Medication Management: RN will administer medications as ordered by provider, will assess and evaluate patient's response and provide education to patient for prescribed medication.  RN will report any adverse and/or side effects to prescribing provider.  Therapeutic Interventions: 1 on 1 counseling sessions, Psychoeducation, Medication administration, Evaluate responses to treatment, Monitor vital signs and CBGs as ordered, Perform/monitor CIWA, COWS, AIMS and Fall Risk screenings as ordered, Perform wound care treatments as ordered.  Evaluation of Outcomes: Not Met   LCSW Treatment Plan for Primary Diagnosis: Bipolar I disorder, most recent episode (or current) manic (HCC) Long Term Goal(s): Safe transition to appropriate next level of care at discharge, Engage patient in therapeutic group addressing interpersonal concerns.  Short Term Goals: Engage patient in aftercare planning with referrals and resources, Increase social support, Increase ability to appropriately verbalize feelings, Increase emotional regulation, Facilitate acceptance of mental health diagnosis and concerns, and Increase skills for wellness and recovery  Therapeutic Interventions: Assess for all discharge needs, 1 to 1 time with Social worker, Explore available resources and support systems, Assess for adequacy in community support network, Educate family and significant other(s) on suicide prevention, Complete Psychosocial Assessment, Interpersonal group  therapy.  Evaluation of Outcomes: Not Met   Progress in Treatment: Attending groups: No. Participating in groups: No. Taking medication as prescribed: Yes. Toleration medication: Yes. Family/Significant other contact made: No, will contact:  once permission is given.  Patient understands diagnosis: Yes. Discussing patient identified problems/goals with staff: Yes. Medical problems stabilized or resolved: Yes. Denies suicidal/homicidal ideation: Yes. Issues/concerns per patient self-inventory: No. Other: none  New problem(s) identified: No, Describe:  none  New Short Term/Long Term Goal(s): detox, elimination of symptoms of psychosis,  medication management for mood stabilization; elimination of SI thoughts; development of comprehensive mental wellness/sobriety plan.   Patient Goals:  "get all my ailments intact so I can function properly"  Discharge Plan or Barriers: CSW to assist patient in development of appropriate discharge plans.   Reason for Continuation of Hospitalization: Anxiety Depression Medical Issues Medication stabilization Suicidal ideation  Estimated Length of Stay:  1-7 days  Last 3 [place] Suicide Severity Risk Score: Flowsheet Row Admission (Current) from 01/31/2023 in [facility]PAT[facility]INE ED from 01/30/2023 in [facility] ED from 12/31/2022 in [facility]  C-SSRS RISK CATEGORY No Risk Error: Question 2 not populated No Risk       Last PHQ 2/9 Scores:     No data to display          Scribe for Treatment Team: Harden Mo, [place] 02/01/2023 10:43 AM

## 2023-02-02 LAB — PROLACTIN: Prolactin: 8.1 ng/mL (ref 3.6–25.2)

## 2023-02-02 NOTE — Group Note (Signed)
Date:  02/02/2023 Time:  3:49 PM  Group Topic/Focus:  Outdoor Recreation/Activity    Participation Level:  Did Not Attend   Lynelle Smoke [facility] 02/02/2023, 3:49 PM

## 2023-02-02 NOTE — Progress Notes (Signed)
Patient alert and oriented x 4, no distress noted, she appears anxious but polite, she brightens upon approach denies SI/HI/AVH. Patient was noted interacting appropriately with peers and staff. 15 minutes safety checks maintained will continue to monitor.

## 2023-02-02 NOTE — Plan of Care (Signed)

## 2023-02-02 NOTE — Progress Notes (Signed)
D- Patient alert and oriented x 4. Affect depressed/mood depressed and weepy. Denies SI/ HI/ AVH. Patient endorses depression and anxiety. She complains of anal/rectal prolapse pain. Noted approximate 2" x 2" prolapsed tissue, beefy red protruding from anus. Noted small amount BM noted in area. Peri care administered. No bleeding noticed at present. MD notified of need for medical/surgical consult- consult placed and in EPIC. She complains of pain 10/10- PRN Tramadol 50 mg po with fair results 7/10. She also c/o anxiety and Ativan 2 mg administered with fair results. She was instructed to stay off of her feet as much as possible, rest and call for assistance when needed. A- Scheduled medications administered to patient, per MD orders. Support and encouragement provided.  Routine safety checks conducted every 15 minutes without incident.  Patient informed to notify staff with problems or concerns and verbalizes understanding. R- No adverse drug reactions noted.  Patient compliant with medications and treatment plan. Patient receptive and cooperative. She spent most of the day in her room in bed, lying on her side which helped relieved pain.  Patient contracts for safety and  remains safe on the unit at this time.

## 2023-02-02 NOTE — Progress Notes (Signed)
[facility] MD Progress Note  02/02/2023 12:08 PM Stacy Bolton  MRN:  [id number] Subjective:  Patient alert and oriented x 4, no distress noted, she appears anxious but polite, she brightens upon approach denies SI/HI/AVH. Patient was noted interacting appropriately with peers and staff. Patient has been calm, is compliant with her medicines and slept well last night. She presents now in a " mellow" mood and is denying major issues. She is denying current SI/HI or any AVH. She is pleasant during the interactions and is C/O issues with her " rectal prolapse".  Principal Problem: Bipolar I disorder, most recent episode (or current) manic (HCC) Diagnosis: Principal Problem:   Bipolar I disorder, most recent episode (or current) manic (HCC) Active Problems:   COPD (chronic obstructive pulmonary disease) (HCC)   Cocaine abuse (HCC)  Total Time spent with patient: 20 minutes  Past Psychiatric History: see H&P  Past Medical History:  Past Medical History:  Diagnosis Date   Anxiety    Asthma    COPD (chronic obstructive pulmonary disease) (HCC)    Dyspnea    Endometriosis    Seizure (HCC)     Past Surgical History:  Procedure Laterality Date   CHOLECYSTECTOMY     Family History: History reviewed. No pertinent family history. Family Psychiatric  History: see H&P Social History:  Social History   Substance and Sexual Activity  Alcohol Use Not Currently     Social History   Substance and Sexual Activity  Drug Use Yes   Types: Cocaine, Marijuana   Comment: EVERY THREE DAYS    Social History   Socioeconomic History   Marital status: Single    Spouse name: Not on file   Number of children: Not on file   Years of education: Not on file   Highest education level: Not on file  Occupational History   Not on file  Tobacco Use   Smoking status: Every Day    Packs/day: 1.50    Years: 30.00    Additional pack years: 0.00    Total pack years: 45.00    Types: Cigarettes    Smokeless tobacco: Not on file  Vaping Use   Vaping Use: Never used  Substance and Sexual Activity   Alcohol use: Not Currently   Drug use: Yes    Types: Cocaine, Marijuana    Comment: EVERY THREE DAYS   Sexual activity: Not Currently  Other Topics Concern   Not on file  Social History Narrative   Not on file   Social Determinants of Health   Financial Resource Strain: Not on file  Food Insecurity: No Food Insecurity (01/31/2023)   Hunger Vital Sign    Worried About Running Out of Food in the Last Year: Never true    Ran Out of Food in the Last Year: Never true  Transportation Needs: No Transportation Needs (01/31/2023)   PRAPARE - Administrator, Civil Service (Medical): No    Lack of Transportation (Non-Medical): No  Physical Activity: Not on file  Stress: Not on file  Social Connections: Not on file   Additional Social History:                         Sleep: Good  Appetite:  Good  Current Medications: Current Facility-Administered Medications  Medication Dose Route Frequency Provider Last Rate Last Admin   albuterol (VENTOLIN HFA) 108 (90 Base) MCG/ACT inhaler 2 puff  2 puff Inhalation Q4H PRN Joaquin Courts  S, NP   2 puff at 02/01/23 0902   alum & mag hydroxide-simeth (MAALOX/MYLANTA) 200-200-20 MG/5ML suspension 30 mL  30 mL Oral Q4H PRN Onuoha, Chinwendu V, NP   30 mL at 02/01/23 2218   divalproex (DEPAKOTE ER) 24 hr tablet 500 mg  500 mg Oral Daily Bing Neighbors, NP   500 mg at 02/02/23 0932   feeding supplement (ENSURE ENLIVE / ENSURE PLUS) liquid 237 mL  237 mL Oral BID BM Clapacs, John T, MD   237 mL at 02/02/23 0931   fluticasone furoate-vilanterol (BREO ELLIPTA) 200-25 MCG/ACT 1 puff  1 puff Inhalation Daily Bing Neighbors, NP   1 puff at 02/02/23 0931   haloperidol (HALDOL) tablet 5 mg  5 mg Oral TID PRN Onuoha, Chinwendu V, NP       Or   haloperidol lactate (HALDOL) injection 5 mg  5 mg Intramuscular TID PRN Onuoha, Chinwendu V,  NP       hydrOXYzine (ATARAX) tablet 25 mg  25 mg Oral TID PRN Bing Neighbors, NP   25 mg at 02/01/23 1750   LORazepam (ATIVAN) tablet 2 mg  2 mg Oral Q6H PRN Clapacs, John T, MD   2 mg at 02/01/23 2218   magnesium hydroxide (MILK OF MAGNESIA) suspension 30 mL  30 mL Oral Daily PRN Onuoha, Chinwendu V, NP       multivitamin with minerals tablet 1 tablet  1 tablet Oral Daily Clapacs, John T, MD   1 tablet at 02/02/23 0932   nicotine (NICODERM CQ - dosed in mg/24 hours) patch 21 mg  21 mg Transdermal Daily Clapacs, Jackquline Denmark, MD   21 mg at 02/02/23 4098   nicotine polacrilex (NICORETTE) gum 2 mg  2 mg Oral PRN Clapacs, Jackquline Denmark, MD   2 mg at 01/31/23 1408   psyllium (HYDROCIL/METAMUCIL) 1 packet  1 packet Oral Daily Clapacs, Jackquline Denmark, MD   1 packet at 02/02/23 0931   risperiDONE (RISPERDAL) tablet 2 mg  2 mg Oral QHS Clapacs, John T, MD   2 mg at 02/01/23 2213   traMADol (ULTRAM) tablet 50 mg  50 mg Oral Q6H PRN Clapacs, Jackquline Denmark, MD   50 mg at 02/01/23 1750   traZODone (DESYREL) tablet 50 mg  50 mg Oral QHS PRN Bing Neighbors, NP   50 mg at 02/01/23 2213    Lab Results: No results found for this or any previous visit (from the past 48 hour(s)).  Blood Alcohol level:  Lab Results  Component Value Date   ETH <10 01/30/2023   ETH  03/19/2010    <5        LOWEST DETECTABLE LIMIT FOR SERUM ALCOHOL IS 5 mg/dL FOR MEDICAL PURPOSES ONLY    Metabolic Disorder Labs: Lab Results  Component Value Date   HGBA1C 6.1 (H) 01/30/2023   MPG 128.37 01/30/2023   MPG 108 03/19/2010   Lab Results  Component Value Date   PROLACTIN 8.1 01/30/2023   Lab Results  Component Value Date   CHOL 165 01/30/2023   TRIG 58 01/30/2023   HDL 62 01/30/2023   CHOLHDL 2.7 01/30/2023   VLDL 12 01/30/2023   LDLCALC 91 01/30/2023   LDLCALC  03/20/2010    38        Total Cholesterol/HDL:CHD Risk Coronary Heart Disease Risk Table                     Men   Women  1/2 Average Risk   3.4   3.3  Average Risk        5.0   4.4  2 X Average Risk   9.6   7.1  3 X Average Risk  23.4   11.0        Use the calculated Patient Ratio above and the CHD Risk Table to determine the patient's CHD Risk.        ATP III CLASSIFICATION (LDL):  <100     mg/dL   Optimal  914-782  mg/dL   Near or Above                    Optimal  130-159  mg/dL   Borderline  956-213  mg/dL   High  >086     mg/dL   Very High    Physical Findings: AIMS:  , ,  ,  ,    CIWA:    COWS:     Musculoskeletal: Strength & Muscle Tone: within normal limits Gait & Station: normal Patient leans: N/A  Psychiatric Specialty Exam:  Presentation  General Appearance:  Casual  Eye Contact: Fair  Speech: Normal Rate; Slow  Speech Volume: Decreased  Handedness: Right   Mood and Affect  Mood: Depressed  Affect: Appropriate; Congruent   Thought Process  Thought Processes: Coherent  Descriptions of Associations:Intact  Orientation:Full (Time, Place and Person)  Thought Content:Logical  History of Schizophrenia/Schizoaffective disorder:No  Duration of Psychotic Symptoms:-- (unknown- could not be assessed due to pt's emotional state)  Hallucinations:Hallucinations: None  Ideas of Reference:None  Suicidal Thoughts:Suicidal Thoughts: No  Homicidal Thoughts:Homicidal Thoughts: No   Sensorium  Memory: Immediate Fair; Remote Fair  Judgment: Fair  Insight: Fair   Art therapist  Concentration: Fair  Attention Span: Fair  Recall: [employer] of Knowledge: Fair  Language: Fair   Psychomotor Activity  Psychomotor Activity:Psychomotor Activity: Normal   Assets  Assets: Manufacturing systems engineer; Desire for Improvement   Sleep  Sleep:Sleep: Fair    Physical Exam: Physical Exam ROS Blood pressure 96/67, pulse 88, temperature 98.6 F (37 C), temperature source Oral, resp. rate 18, height 5\' 5"  (1.651 m), weight 56.7 kg, last menstrual period 10/13/2011, SpO2 94 %. Body mass index is  20.8 kg/m.   Treatment Plan Summary: Daily contact with patient to assess and evaluate symptoms and progress in treatment, Medication management, and Plan : Continue current meds and doses.   Halton Neas 02/02/2023, 12:08 PM

## 2023-02-02 NOTE — Group Note (Signed)
Date:  02/02/2023 Time:  3:52 PM  Group Topic/Focus:  Goals Group:   The focus of this group is to help patients establish daily goals to achieve during treatment and discuss how the patient can incorporate goal setting into their daily lives to aide in recovery.    Participation Level:  Did Not Attend   Lynelle Smoke [facility] 02/02/2023, 3:52 PM

## 2023-02-02 NOTE — Group Note (Signed)
LCSW Group Therapy Note   Group Date: 02/02/2023 Start Time: 1305 End Time: 1335   Type of Therapy and Topic:  Group Therapy: Group Therapy: Self-Care VS Coping Skills  Participation Level:  Did Not Attend     Summary of Patient Progress:  The patient did not attend group.     Marshell Levan, LCSWA 02/02/2023  3:13 PM

## 2023-02-03 MED ORDER — PAROXETINE HCL 10 MG PO TABS
10.0000 mg | ORAL_TABLET | Freq: Every day | ORAL | Status: DC
  Administered 2023-02-03 – 2023-02-08 (×6): 10 mg via ORAL
  Filled 2023-02-03 (×6): qty 1

## 2023-02-03 MED ORDER — DIVALPROEX SODIUM ER 500 MG PO TB24
750.0000 mg | ORAL_TABLET | Freq: Every day | ORAL | Status: DC
  Administered 2023-02-04 – 2023-02-05 (×2): 750 mg via ORAL
  Filled 2023-02-03 (×2): qty 1

## 2023-02-03 NOTE — Plan of Care (Signed)
Pt denies SI / HI / AVH at this time. Pt reports 8/10 pain to rectum area. Pt is adherent with assessment and medications at this time. No signs of distress or injury. Pt is out and in milieu. Staff will continue to monitor Q15 for safety.    Problem: Education: Goal: Knowledge of General Education information will improve Description: Including pain rating scale, medication(s)/side effects and non-pharmacologic comfort measures Outcome: Not Progressing   Problem: Health Behavior/Discharge Planning: Goal: Ability to manage health-related needs will improve Outcome: Not Progressing   Problem: Clinical Measurements: Goal: Ability to maintain clinical measurements within normal limits will improve Outcome: Not Progressing

## 2023-02-03 NOTE — Progress Notes (Signed)
Spoke with PT. She is very tearful and complains of pain while sitting. Pt reports feeling "ill" at this time. Pt vitals are assessed by NT/MHT. WNL. Temp is slightly elevated but not febrile at this time. Staff will continue to monitor and reassess.
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
## 2023-02-03 NOTE — Progress Notes (Signed)
Patient alerted the nurse to come to the room, upon arrival she stated " I am bleeding from my rectum and l have a wet brief in the trash can" upon assessment writer noted a wet brief with appears pink with some serosanguinous fluid. VS remain stable, will notify the health care provider.

## 2023-02-03 NOTE — Progress Notes (Signed)
Patient alert and oriented x 3, her affect is bright on approach, she denies SI/HI/AVH, interacting appropriately with peers and staff. Patient was asleep early and did not take her evening medication as scheduled, 15 minutes safety checks maintained will continue to monitor.

## 2023-02-03 NOTE — Progress Notes (Signed)
[facility] MD Progress Note  02/03/2023 10:15 AM Stacy Bolton  MRN:  [id number] Subjective:  Patient alert and oriented x 3, her affect is bright on approach, she denies SI/HI/AVH, interacting appropriately with peers and staff. Patient was asleep early and did not take her evening medication as scheduled. Patient has been sad, tearful, C/O issues with her Rectal prolapse and is worried about her belongings at the shelter. She is C/O pain and discomfort in her rectum and feels more depressed today. She is eating alright, feels weak and hopeless.  Principal Problem: Bipolar I disorder, most recent episode (or current) manic (HCC) Diagnosis: Principal Problem:   Bipolar I disorder, most recent episode (or current) manic (HCC) Active Problems:   COPD (chronic obstructive pulmonary disease) (HCC)   Cocaine abuse (HCC)  Total Time spent with patient: 20 minutes  Past Psychiatric History:   Past Medical History:  Past Medical History:  Diagnosis Date   Anxiety    Asthma    COPD (chronic obstructive pulmonary disease) (HCC)    Dyspnea    Endometriosis    Seizure (HCC)     Past Surgical History:  Procedure Laterality Date   CHOLECYSTECTOMY     Family History: History reviewed. No pertinent family history. Family Psychiatric  History:  Social History:  Social History   Substance and Sexual Activity  Alcohol Use Not Currently     Social History   Substance and Sexual Activity  Drug Use Yes   Types: Cocaine, Marijuana   Comment: EVERY THREE DAYS    Social History   Socioeconomic History   Marital status: Single    Spouse name: Not on file   Number of children: Not on file   Years of education: Not on file   Highest education level: Not on file  Occupational History   Not on file  Tobacco Use   Smoking status: Every Day    Packs/day: 1.50    Years: 30.00    Additional pack years: 0.00    Total pack years: 45.00    Types: Cigarettes   Smokeless tobacco: Not on file   Vaping Use   Vaping Use: Never used  Substance and Sexual Activity   Alcohol use: Not Currently   Drug use: Yes    Types: Cocaine, Marijuana    Comment: EVERY THREE DAYS   Sexual activity: Not Currently  Other Topics Concern   Not on file  Social History Narrative   Not on file   Social Determinants of Health   Financial Resource Strain: Not on file  Food Insecurity: No Food Insecurity (01/31/2023)   Hunger Vital Sign    Worried About Running Out of Food in the Last Year: Never true    Ran Out of Food in the Last Year: Never true  Transportation Needs: No Transportation Needs (01/31/2023)   PRAPARE - Administrator, Civil Service (Medical): No    Lack of Transportation (Non-Medical): No  Physical Activity: Not on file  Stress: Not on file  Social Connections: Not on file   Additional Social History:                         Sleep: Fair  Appetite:  Fair  Current Medications: Current Facility-Administered Medications  Medication Dose Route Frequency Provider Last Rate Last Admin   albuterol (VENTOLIN HFA) 108 (90 Base) MCG/ACT inhaler 2 puff  2 puff Inhalation Q4H PRN Bing Neighbors, NP   2  puff at 02/01/23 0902   alum & mag hydroxide-simeth (MAALOX/MYLANTA) 200-200-20 MG/5ML suspension 30 mL  30 mL Oral Q4H PRN Onuoha, Chinwendu V, NP   30 mL at 02/01/23 2218   [START ON 02/04/2023] divalproex (DEPAKOTE ER) 24 hr tablet 750 mg  750 mg Oral Daily Chryl Holten       feeding supplement (ENSURE ENLIVE / ENSURE PLUS) liquid 237 mL  237 mL Oral BID BM Clapacs, John T, MD   237 mL at 02/02/23 1418   fluticasone furoate-vilanterol (BREO ELLIPTA) 200-25 MCG/ACT 1 puff  1 puff Inhalation Daily Bing Neighbors, NP   1 puff at 02/03/23 0854   haloperidol (HALDOL) tablet 5 mg  5 mg Oral TID PRN Onuoha, Chinwendu V, NP       Or   haloperidol lactate (HALDOL) injection 5 mg  5 mg Intramuscular TID PRN Onuoha, Chinwendu V, NP       hydrOXYzine (ATARAX) tablet 25  mg  25 mg Oral TID PRN Bing Neighbors, NP   25 mg at 02/01/23 1750   LORazepam (ATIVAN) tablet 2 mg  2 mg Oral Q6H PRN Clapacs, John T, MD   2 mg at 02/03/23 0656   magnesium hydroxide (MILK OF MAGNESIA) suspension 30 mL  30 mL Oral Daily PRN Onuoha, Chinwendu V, NP       multivitamin with minerals tablet 1 tablet  1 tablet Oral Daily Clapacs, Jackquline Denmark, MD   1 tablet at 02/03/23 0854   nicotine (NICODERM CQ - dosed in mg/24 hours) patch 21 mg  21 mg Transdermal Daily Clapacs, Jackquline Denmark, MD   21 mg at 02/03/23 1610   nicotine polacrilex (NICORETTE) gum 2 mg  2 mg Oral PRN Clapacs, John T, MD   2 mg at 01/31/23 1408   PARoxetine (PAXIL) tablet 10 mg  10 mg Oral Daily Handsome Anglin       psyllium (HYDROCIL/METAMUCIL) 1 packet  1 packet Oral Daily Clapacs, Jackquline Denmark, MD   1 packet at 02/03/23 0855   risperiDONE (RISPERDAL) tablet 2 mg  2 mg Oral QHS Clapacs, John T, MD   2 mg at 02/01/23 2213   traMADol (ULTRAM) tablet 50 mg  50 mg Oral Q6H PRN Clapacs, Jackquline Denmark, MD   50 mg at 02/03/23 0656   traZODone (DESYREL) tablet 50 mg  50 mg Oral QHS PRN Bing Neighbors, NP   50 mg at 02/01/23 2213    Lab Results: No results found for this or any previous visit (from the past 48 hour(s)).  Blood Alcohol level:  Lab Results  Component Value Date   ETH <10 01/30/2023   ETH  03/19/2010    <5        LOWEST DETECTABLE LIMIT FOR SERUM ALCOHOL IS 5 mg/dL FOR MEDICAL PURPOSES ONLY    Metabolic Disorder Labs: Lab Results  Component Value Date   HGBA1C 6.1 (H) 01/30/2023   MPG 128.37 01/30/2023   MPG 108 03/19/2010   Lab Results  Component Value Date   PROLACTIN 8.1 01/30/2023   Lab Results  Component Value Date   CHOL 165 01/30/2023   TRIG 58 01/30/2023   HDL 62 01/30/2023   CHOLHDL 2.7 01/30/2023   VLDL 12 01/30/2023   LDLCALC 91 01/30/2023   LDLCALC  03/20/2010    38        Total Cholesterol/HDL:CHD Risk Coronary Heart Disease Risk Table  Men   Women  1/2 Average Risk    3.4   3.3  Average Risk       5.0   4.4  2 X Average Risk   9.6   7.1  3 X Average Risk  23.4   11.0        Use the calculated Patient Ratio above and the CHD Risk Table to determine the patient's CHD Risk.        ATP III CLASSIFICATION (LDL):  <100     mg/dL   Optimal  161-096  mg/dL   Near or Above                    Optimal  130-159  mg/dL   Borderline  045-409  mg/dL   High  >811     mg/dL   Very High    Physical Findings: AIMS:  , ,  ,  ,    CIWA:    COWS:     Musculoskeletal: Strength & Muscle Tone: decreased Gait & Station: normal Patient leans: Front  Psychiatric Specialty Exam:  Presentation  General Appearance:  Casual  Eye Contact: Fair  Speech: Normal Rate; Slow  Speech Volume: Decreased  Handedness: Right   Mood and Affect  Mood: Depressed  Affect: Depressed, tearful  Thought Process  Thought Processes: Coherent  Descriptions of Associations:Intact  Orientation:Full (Time, Place and Person)  Thought Content:Logical  History of Schizophrenia/Schizoaffective disorder:No  Duration of Psychotic Symptoms:-- (unknown- could not be assessed due to pt's emotional state)  Hallucinations:Hallucinations: None  Ideas of Reference:None  Suicidal Thoughts:Suicidal Thoughts: No  Homicidal Thoughts:Homicidal Thoughts: No   Sensorium  Memory: Immediate Fair; Remote Fair  Judgment: Fair  Insight: Fair   Art therapist  Concentration: Fair  Attention Span: Fair  Recall: [employer] of Knowledge: Fair  Language: Fair   Psychomotor Activity  Psychomotor Activity: Psychomotor Activity: Normal   Assets  Assets: Communication Skills; Desire for Improvement   Sleep  Sleep: Sleep: Fair    Physical Exam: Physical Exam Constitutional:      Appearance: She is ill-appearing.  HENT:     Head: Normocephalic and atraumatic.     Right Ear: External ear normal.     Left Ear: External ear normal.     Nose:  Nose normal.     Mouth/Throat:     Mouth: Mucous membranes are moist.  Eyes:     Conjunctiva/sclera: Conjunctivae normal.     Pupils: Pupils are equal, round, and reactive to light.  Cardiovascular:     Rate and Rhythm: Normal rate and regular rhythm.  Pulmonary:     Effort: Pulmonary effort is normal.     Breath sounds: Normal breath sounds.  Abdominal:     General: Abdomen is flat.     Comments: Rectal prolapse  Musculoskeletal:     Cervical back: Normal range of motion and neck supple.  Skin:    General: Skin is warm.  Neurological:     General: No focal deficit present.     Mental Status: She is alert.    Review of Systems  Constitutional: Negative.   HENT: Negative.    Eyes: Negative.   Respiratory: Negative.    Cardiovascular: Negative.   Gastrointestinal: Negative.   Genitourinary: Negative.   Musculoskeletal: Negative.   Skin: Negative.   Neurological: Negative.   Endo/Heme/Allergies: Negative.   Psychiatric/Behavioral:  Positive for depression. The patient is nervous/anxious and has insomnia.    Blood pressure  99/73, pulse 71, temperature 99.1 F (37.3 C), temperature source Oral, resp. rate 18, height 5\' 5"  (1.651 m), weight 56.7 kg, last menstrual period 10/13/2011, SpO2 (!) 89 %. Body mass index is 20.8 kg/m.   Treatment Plan Summary: Daily contact with patient to assess and evaluate symptoms and progress in treatment, Medication management, and Plan : Increase Depakote dose; Add paxil.   Maribelle Hopple 02/03/2023, 10:15 AM
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
## 2023-02-04 NOTE — Progress Notes (Signed)
Spoke with provider, states he has consulted with GI, OBGYN, order to continue with current medication regimen and to lay down and for patient to continue to push them in as before.

## 2023-02-04 NOTE — Plan of Care (Signed)
  Problem: Education: Goal: Knowledge of General Education information will improve Description: Including pain rating scale, medication(s)/side effects and non-pharmacologic comfort measures Outcome: Progressing   Problem: Coping: Goal: Level of anxiety will decrease Outcome: Progressing   Problem: Safety: Goal: Ability to remain free from injury will improve Outcome: Progressing   

## 2023-02-04 NOTE — Group Note (Signed)
Date:  02/04/2023 Time:  6:37 PM  Group Topic/Focus:  Group Activity      Participation Level:  Did Not Attend  Ashaya Raftery A Sritha Chauncey 02/04/2023, 6:37 PM

## 2023-02-04 NOTE — Progress Notes (Signed)
Patient is alert and oriented X4.  Patient denies SI/HI/AVH. Patient medicated with PRN Tramadol for level 8/10 rectal pain due to a prolapsed rectum which occurred sometime prior to admission. Patient states she had a bm this morning.  Patient rates her depression level as 5/10. Patient is cooperative and compliant with care.  Emotional support provided. Patient continues on Q15 minute checks.

## 2023-02-04 NOTE — Group Note (Signed)
[facility] LCSW Group Therapy Note    Group Date: 02/04/2023 Start Time: 1300 End Time: 1350  Type of Therapy and Topic:  Group Therapy:  Overcoming Obstacles  Participation Level:  [facility] PARTICIPATION LEVEL: Did Not Attend   Description of Group:   In this group patients will be encouraged to explore what they see as obstacles to their own wellness and recovery. They will be guided to discuss their thoughts, feelings, and behaviors related to these obstacles. The group will process together ways to cope with barriers, with attention given to specific choices patients can make. Each patient will be challenged to identify changes they are motivated to make in order to overcome their obstacles. This group will be process-oriented, with patients participating in exploration of their own experiences as well as giving and receiving support and challenge from other group members.  Therapeutic Goals: 1. Patient will identify personal and current obstacles as they relate to admission. 2. Patient will identify barriers that currently interfere with their wellness or overcoming obstacles.  3. Patient will identify feelings, thought process and behaviors related to these barriers. 4. Patient will identify two changes they are willing to make to overcome these obstacles:    Summary of Patient Progress X   Therapeutic Modalities:   Cognitive Behavioral Therapy Solution Focused Therapy Motivational Interviewing Relapse Prevention Therapy   Glenis Smoker, LCSW

## 2023-02-04 NOTE — Progress Notes (Signed)
Patient alert and oriented  x 4 c/o pain in the rectum due rectum prolapse, she was medicated per prn pain protocol. Patient's affect is blunted, she noted sad and she rated depression a 7/10 on a scale 0-10. Patient was offered emotional support and encouragement, 15 minutes safety checks maintained will continue to monitor.

## 2023-02-04 NOTE — [facility] Group Notes (Signed)
[facility] Group Notes:  (Nursing/MHT/Case Management/Adjunct)  Date:  02/04/2023  Time:  10:20 AM  Type of Therapy:  Psychoeducational Skills  Participation Level:  Did Not Attend  Participation Quality:   na  Affect:   na  Cognitive:   na  Insight:  None  Engagement in Group:   na  Modes of Intervention:   na  Summary of Progress/Problems:  Stacy Bolton 02/04/2023, 10:20 AM

## 2023-02-04 NOTE — Progress Notes (Signed)
[facility] MD Progress Note  02/04/2023 11:41 AM Stacy Bolton  MRN:  [id number] Subjective: Follow-up for this patient with a history of bipolar disorder and substance abuse.  Patient's complaints at this point are primarily medical.  Continues to complain of pain from her rectal prolapse.  Also complains that she cannot sleep on a hard surface.  She wants to get me to somehow provide her with a soft mattress to go back to the shelter. Principal Problem: Bipolar I disorder, most recent episode (or current) manic (HCC) Diagnosis: Principal Problem:   Bipolar I disorder, most recent episode (or current) manic (HCC) Active Problems:   COPD (chronic obstructive pulmonary disease) (HCC)   Cocaine abuse (HCC)  Total Time spent with patient: 30 minutes  Past Psychiatric History: Past history of substance abuse and depression and probably bipolar disorder multiple medical problems  Past Medical History:  Past Medical History:  Diagnosis Date   Anxiety    Asthma    COPD (chronic obstructive pulmonary disease) (HCC)    Dyspnea    Endometriosis    Seizure (HCC)     Past Surgical History:  Procedure Laterality Date   CHOLECYSTECTOMY     Family History: History reviewed. No pertinent family history. Family Psychiatric  History: See previous Social History:  Social History   Substance and Sexual Activity  Alcohol Use Not Currently     Social History   Substance and Sexual Activity  Drug Use Yes   Types: Cocaine, Marijuana   Comment: EVERY THREE DAYS    Social History   Socioeconomic History   Marital status: Single    Spouse name: Not on file   Number of children: Not on file   Years of education: Not on file   Highest education level: Not on file  Occupational History   Not on file  Tobacco Use   Smoking status: Every Day    Packs/day: 1.50    Years: 30.00    Additional pack years: 0.00    Total pack years: 45.00    Types: Cigarettes   Smokeless tobacco: Not on file   Vaping Use   Vaping Use: Never used  Substance and Sexual Activity   Alcohol use: Not Currently   Drug use: Yes    Types: Cocaine, Marijuana    Comment: EVERY THREE DAYS   Sexual activity: Not Currently  Other Topics Concern   Not on file  Social History Narrative   Not on file   Social Determinants of Health   Financial Resource Strain: Not on file  Food Insecurity: No Food Insecurity (01/31/2023)   Hunger Vital Sign    Worried About Running Out of Food in the Last Year: Never true    Ran Out of Food in the Last Year: Never true  Transportation Needs: No Transportation Needs (01/31/2023)   PRAPARE - Administrator, Civil Service (Medical): No    Lack of Transportation (Non-Medical): No  Physical Activity: Not on file  Stress: Not on file  Social Connections: Not on file   Additional Social History:                         Sleep: Fair  Appetite:  Fair  Current Medications: Current Facility-Administered Medications  Medication Dose Route Frequency Provider Last Rate Last Admin   albuterol (VENTOLIN HFA) 108 (90 Base) MCG/ACT inhaler 2 puff  2 puff Inhalation Q4H PRN Bing Neighbors, NP   2 puff  at 02/04/23 0930   alum & mag hydroxide-simeth (MAALOX/MYLANTA) 200-200-20 MG/5ML suspension 30 mL  30 mL Oral Q4H PRN Onuoha, Chinwendu V, NP   30 mL at 02/01/23 2218   divalproex (DEPAKOTE ER) 24 hr tablet 750 mg  750 mg Oral Daily Malik, Kashif   750 mg at 02/04/23 0926   feeding supplement (ENSURE ENLIVE / ENSURE PLUS) liquid 237 mL  237 mL Oral BID BM Likisha Alles T, MD   237 mL at 02/04/23 0926   fluticasone furoate-vilanterol (BREO ELLIPTA) 200-25 MCG/ACT 1 puff  1 puff Inhalation Daily Bing Neighbors, NP   1 puff at 02/04/23 0900   haloperidol (HALDOL) tablet 5 mg  5 mg Oral TID PRN Onuoha, Chinwendu V, NP       Or   haloperidol lactate (HALDOL) injection 5 mg  5 mg Intramuscular TID PRN Onuoha, Chinwendu V, NP       hydrOXYzine (ATARAX) tablet 25  mg  25 mg Oral TID PRN Bing Neighbors, NP   25 mg at 02/01/23 1750   LORazepam (ATIVAN) tablet 2 mg  2 mg Oral Q6H PRN Samarie Pinder T, MD   2 mg at 02/03/23 2118   magnesium hydroxide (MILK OF MAGNESIA) suspension 30 mL  30 mL Oral Daily PRN Onuoha, Chinwendu V, NP   30 mL at 02/03/23 2119   multivitamin with minerals tablet 1 tablet  1 tablet Oral Daily Teofila Bowery, Jackquline Denmark, MD   1 tablet at 02/04/23 6045   nicotine (NICODERM CQ - dosed in mg/24 hours) patch 21 mg  21 mg Transdermal Daily Flonnie Wierman, Jackquline Denmark, MD   21 mg at 02/04/23 4098   nicotine polacrilex (NICORETTE) gum 2 mg  2 mg Oral PRN Catheryn Slifer T, MD   2 mg at 01/31/23 1408   PARoxetine (PAXIL) tablet 10 mg  10 mg Oral Daily Malik, Kashif   10 mg at 02/04/23 1191   psyllium (HYDROCIL/METAMUCIL) 1 packet  1 packet Oral Daily Daniell Mancinas, Jackquline Denmark, MD   1 packet at 02/04/23 0936   risperiDONE (RISPERDAL) tablet 2 mg  2 mg Oral QHS Augie Vane T, MD   2 mg at 02/03/23 2118   traMADol (ULTRAM) tablet 50 mg  50 mg Oral Q6H PRN Elfrida Pixley, Jackquline Denmark, MD   50 mg at 02/04/23 0919   traZODone (DESYREL) tablet 50 mg  50 mg Oral QHS PRN Bing Neighbors, NP   50 mg at 02/03/23 2119    Lab Results: No results found for this or any previous visit (from the past 48 hour(s)).  Blood Alcohol level:  Lab Results  Component Value Date   ETH <10 01/30/2023   ETH  03/19/2010    <5        LOWEST DETECTABLE LIMIT FOR SERUM ALCOHOL IS 5 mg/dL FOR MEDICAL PURPOSES ONLY    Metabolic Disorder Labs: Lab Results  Component Value Date   HGBA1C 6.1 (H) 01/30/2023   MPG 128.37 01/30/2023   MPG 108 03/19/2010   Lab Results  Component Value Date   PROLACTIN 8.1 01/30/2023   Lab Results  Component Value Date   CHOL 165 01/30/2023   TRIG 58 01/30/2023   HDL 62 01/30/2023   CHOLHDL 2.7 01/30/2023   VLDL 12 01/30/2023   LDLCALC 91 01/30/2023   LDLCALC  03/20/2010    38        Total Cholesterol/HDL:CHD Risk Coronary Heart Disease Risk Table  Men   Women  1/2 Average Risk   3.4   3.3  Average Risk       5.0   4.4  2 X Average Risk   9.6   7.1  3 X Average Risk  23.4   11.0        Use the calculated Patient Ratio above and the CHD Risk Table to determine the patient's CHD Risk.        ATP III CLASSIFICATION (LDL):  <100     mg/dL   Optimal  161-096  mg/dL   Near or Above                    Optimal  130-159  mg/dL   Borderline  045-409  mg/dL   High  >811     mg/dL   Very High    Physical Findings: AIMS:  , ,  ,  ,    CIWA:    COWS:     Musculoskeletal: Strength & Muscle Tone: within normal limits Gait & Station: normal Patient leans: N/A  Psychiatric Specialty Exam:  Presentation  General Appearance:  Casual  Eye Contact: Fair  Speech: Normal Rate; Slow  Speech Volume: Decreased  Handedness: Right   Mood and Affect  Mood: Depressed  Affect: Appropriate; Congruent   Thought Process  Thought Processes: Coherent  Descriptions of Associations:Intact  Orientation:Full (Time, Place and Person)  Thought Content:Logical  History of Schizophrenia/Schizoaffective disorder:No  Duration of Psychotic Symptoms:-- (unknown- could not be assessed due to pt's emotional state)  Hallucinations:No data recorded Ideas of Reference:None  Suicidal Thoughts:No data recorded Homicidal Thoughts:No data recorded  Sensorium  Memory: Immediate Fair; Remote Fair  Judgment: Fair  Insight: Fair   Art therapist  Concentration: Fair  Attention Span: Fair  Recall: [employer] of Knowledge: Fair  Language: Fair   Psychomotor Activity  Psychomotor Activity:No data recorded  Assets  Assets: Communication Skills; Desire for Improvement   Sleep  Sleep:No data recorded   Physical Exam: Physical Exam Vitals and nursing note reviewed.  Constitutional:      Appearance: Normal appearance.  HENT:     Head: Normocephalic and atraumatic.     Mouth/Throat:     Pharynx:  Oropharynx is clear.  Eyes:     Pupils: Pupils are equal, round, and reactive to light.  Cardiovascular:     Rate and Rhythm: Normal rate and regular rhythm.  Pulmonary:     Effort: Pulmonary effort is normal.     Breath sounds: Normal breath sounds.  Abdominal:     General: Abdomen is flat.     Palpations: Abdomen is soft.  Genitourinary:    Comments: No exam performed but the patient continues to complain of chronic rectal prolapse Musculoskeletal:        General: Normal range of motion.  Skin:    General: Skin is warm and dry.  Neurological:     General: No focal deficit present.     Mental Status: She is alert. Mental status is at baseline.  Psychiatric:        Attention and Perception: Attention normal.        Mood and Affect: Mood normal. Affect is tearful.        Speech: Speech normal.        Behavior: Behavior is cooperative.        Thought Content: Thought content normal.        Cognition and Memory: Cognition normal.  Review of Systems  Constitutional: Negative.   HENT: Negative.    Eyes: Negative.   Respiratory: Negative.    Cardiovascular: Negative.   Gastrointestinal: Negative.   Genitourinary:        Rectal prolapse with pain  Musculoskeletal: Negative.   Skin: Negative.   Neurological: Negative.   Psychiatric/Behavioral: Negative.     Blood pressure 100/81, pulse (!) 108, temperature 97.9 F (36.6 C), temperature source Oral, resp. rate 18, height 5\' 5"  (1.651 m), weight 56.7 kg, last menstrual period 10/13/2011, SpO2 94 %. Body mass index is 20.8 kg/m.   Treatment Plan Summary: Medication management and Plan I am going to check with the GI service and see if they have any thoughts about who could follow-up with her rectal prolapse.  Mood wise and psychiatric wise she seems much improved.  I would like to plan on a tentative discharge by Wednesday.  We will check a Depakote level tomorrow.  It seems unavoidable that she will be going back to the  shelter as she has no other resources.  Mordecai Rasmussen, MD 02/04/2023, 11:41 AM

## 2023-02-04 NOTE — Progress Notes (Signed)
Patient in shower and bed crying due to pain in rectum. States the pain is unbearable. Writer assessed area, large mass hanging out of anus, deep red in color, scant amount of blood noted. Patient laying on her side. Writer attempted to contact provider on call. Awaiting callback. Hospital [facility] and [facility] notified.

## 2023-02-05 LAB — VALPROIC ACID LEVEL: Valproic Acid Lvl: 38 ug/mL — ABNORMAL LOW (ref 50.0–100.0)

## 2023-02-05 MED ORDER — DIVALPROEX SODIUM ER 500 MG PO TB24
500.0000 mg | ORAL_TABLET | Freq: Two times a day (BID) | ORAL | Status: DC
  Administered 2023-02-05 – 2023-02-08 (×7): 500 mg via ORAL
  Filled 2023-02-05 (×7): qty 1

## 2023-02-05 NOTE — Progress Notes (Signed)
D- Patient alert and oriented x 4. Affect depressed/mood congruent. Denies SI/ HI/ AVH. She complains of rectal prolapse pain 10/10. PRN Tramodol 50 mg administered x 2 (Q 6 hours) with fair results 7/10. Patient endorses depression and anxiety. Surgical consult done related to prolapse and she states "I am hopeful that they will help me". States her goal today is to look for housing. MD recommended wheelchair and instructed on safe use. She verbalizes understanding. A- Scheduled and PRN medications administered to patient, per MD orders. Support and encouragement provided.  Routine safety checks conducted every 15 minutes without incident.  Patient informed to notify staff with problems or concerns and verbalizes understanding. R- No adverse drug reactions noted.  Patient compliant with medications and treatment plan. Patient receptive, calm cooperative and interacts well with others on the unit.  Patient contracts for safety and  remains safe on the unit at this time.

## 2023-02-05 NOTE — Plan of Care (Signed)
  Problem: Coping: Goal: Level of anxiety will decrease Outcome: Progressing   Problem: Elimination: Goal: Will not experience complications related to bowel motility Outcome: Not Progressing   Problem: Pain Managment: Goal: General experience of comfort will improve Outcome: Not Progressing   Problem: Safety: Goal: Ability to remain free from injury will improve Outcome: Progressing

## 2023-02-05 NOTE — [facility] Group Notes (Signed)
[facility] Group Notes:  (Nursing/MHT/Case Management/Adjunct)  Date:  02/05/2023  Time: 0930  Type of Therapy:  Psychoeducational Skills  Participation Level:  Did Not Attend  Participation Quality:   na  Affect:   na  Cognitive:   na  Insight:  None  Engagement in Group:   na  Modes of Intervention:   na  Summary of Progress/Problems:  Patients were given education on identifying warning signs of mental health decompensation and then identify healthy coping skills. Pt did not attend group. Came at last five minutes as group was ending.   Stacy Bolton 02/05/2023, 3:04 PM

## 2023-02-05 NOTE — Progress Notes (Signed)
[facility] MD Progress Note  02/05/2023 9:48 AM Stacy Bolton  MRN:  [id number] Subjective: Patient seen for follow-up.  52 year old woman with a history of bipolar disorder and several medical problems.  Psychiatrically patient appears to be quite a bit improved.  She is no longer hyperverbal or intrusive or hyper religious.  Able to hold lucid conversations appropriately.  Not depressed and denies suicidal ideation.  Chief complaint at this point continues to be her rectal prolapse which she says is now not reducing and is out pretty much constantly and causing chronic pain.  Depakote level checked this morning came back low at 38 Principal Problem: Bipolar I disorder, most recent episode (or current) manic (HCC) Diagnosis: Principal Problem:   Bipolar I disorder, most recent episode (or current) manic (HCC) Active Problems:   COPD (chronic obstructive pulmonary disease) (HCC)   Cocaine abuse (HCC)  Total Time spent with patient: 30 minutes  Past Psychiatric History: Patient has a history of longstanding substance abuse problems and bipolar disorder with both depressed and manic symptoms  Past Medical History:  Past Medical History:  Diagnosis Date   Anxiety    Asthma    COPD (chronic obstructive pulmonary disease) (HCC)    Dyspnea    Endometriosis    Seizure (HCC)     Past Surgical History:  Procedure Laterality Date   CHOLECYSTECTOMY     Family History: History reviewed. No pertinent family history. Family Psychiatric  History: See above Social History:  Social History   Substance and Sexual Activity  Alcohol Use Not Currently     Social History   Substance and Sexual Activity  Drug Use Yes   Types: Cocaine, Marijuana   Comment: EVERY THREE DAYS    Social History   Socioeconomic History   Marital status: Single    Spouse name: Not on file   Number of children: Not on file   Years of education: Not on file   Highest education level: Not on file  Occupational  History   Not on file  Tobacco Use   Smoking status: Every Day    Packs/day: 1.50    Years: 30.00    Additional pack years: 0.00    Total pack years: 45.00    Types: Cigarettes   Smokeless tobacco: Not on file  Vaping Use   Vaping Use: Never used  Substance and Sexual Activity   Alcohol use: Not Currently   Drug use: Yes    Types: Cocaine, Marijuana    Comment: EVERY THREE DAYS   Sexual activity: Not Currently  Other Topics Concern   Not on file  Social History Narrative   Not on file   Social Determinants of Health   Financial Resource Strain: Not on file  Food Insecurity: No Food Insecurity (01/31/2023)   Hunger Vital Sign    Worried About Running Out of Food in the Last Year: Never true    Ran Out of Food in the Last Year: Never true  Transportation Needs: No Transportation Needs (01/31/2023)   PRAPARE - Administrator, Civil Service (Medical): No    Lack of Transportation (Non-Medical): No  Physical Activity: Not on file  Stress: Not on file  Social Connections: Not on file   Additional Social History:                         Sleep: Fair  Appetite:  Fair  Current Medications: Current Facility-Administered Medications  Medication Dose  Route Frequency Provider Last Rate Last Admin   albuterol (VENTOLIN HFA) 108 (90 Base) MCG/ACT inhaler 2 puff  2 puff Inhalation Q4H PRN Bing Neighbors, NP   2 puff at 02/04/23 0930   alum & mag hydroxide-simeth (MAALOX/MYLANTA) 200-200-20 MG/5ML suspension 30 mL  30 mL Oral Q4H PRN Onuoha, Chinwendu V, NP   30 mL at 02/01/23 2218   divalproex (DEPAKOTE ER) 24 hr tablet 750 mg  750 mg Oral Daily Malik, Kashif   750 mg at 02/05/23 0900   feeding supplement (ENSURE ENLIVE / ENSURE PLUS) liquid 237 mL  237 mL Oral BID BM Trang Bouse T, MD   237 mL at 02/05/23 0901   fluticasone furoate-vilanterol (BREO ELLIPTA) 200-25 MCG/ACT 1 puff  1 puff Inhalation Daily Bing Neighbors, NP   1 puff at 02/05/23 0901    haloperidol (HALDOL) tablet 5 mg  5 mg Oral TID PRN Onuoha, Chinwendu V, NP       Or   haloperidol lactate (HALDOL) injection 5 mg  5 mg Intramuscular TID PRN Onuoha, Chinwendu V, NP       hydrOXYzine (ATARAX) tablet 25 mg  25 mg Oral TID PRN Bing Neighbors, NP   25 mg at 02/01/23 1750   LORazepam (ATIVAN) tablet 2 mg  2 mg Oral Q6H PRN Leiani Enright T, MD   2 mg at 02/04/23 1548   magnesium hydroxide (MILK OF MAGNESIA) suspension 30 mL  30 mL Oral Daily PRN Onuoha, Chinwendu V, NP   30 mL at 02/03/23 2119   multivitamin with minerals tablet 1 tablet  1 tablet Oral Daily Alissia Lory T, MD   1 tablet at 02/05/23 0900   nicotine (NICODERM CQ - dosed in mg/24 hours) patch 21 mg  21 mg Transdermal Daily Jamisen Hawes T, MD   21 mg at 02/05/23 0900   nicotine polacrilex (NICORETTE) gum 2 mg  2 mg Oral PRN Erla Bacchi T, MD   2 mg at 01/31/23 1408   PARoxetine (PAXIL) tablet 10 mg  10 mg Oral Daily Malik, Kashif   10 mg at 02/05/23 0900   psyllium (HYDROCIL/METAMUCIL) 1 packet  1 packet Oral Daily Kean Gautreau, Jackquline Denmark, MD   1 packet at 02/05/23 0859   risperiDONE (RISPERDAL) tablet 2 mg  2 mg Oral QHS Shinichi Anguiano T, MD   2 mg at 02/04/23 2108   traMADol (ULTRAM) tablet 50 mg  50 mg Oral Q6H PRN Isom Kochan T, MD   50 mg at 02/05/23 0900   traZODone (DESYREL) tablet 50 mg  50 mg Oral QHS PRN Bing Neighbors, NP   50 mg at 02/04/23 2109    Lab Results:  Results for orders placed or performed during the hospital encounter of 01/31/23 (from the past 48 hour(s))  Valproic acid level     Status: Abnormal   Collection Time: 02/05/23  7:35 AM  Result Value Ref Range   Valproic Acid Lvl 38 (L) 50.0 - 100.0 ug/mL    Comment: Performed at [facility], [address] [place], [place] [address]    Blood Alcohol level:  Lab Results  Component Value Date   ETH <10 01/30/2023   [facility]  03/19/2010    <5        LOWEST DETECTABLE LIMIT FOR SERUM ALCOHOL IS 5 mg/dL FOR MEDICAL PURPOSES ONLY     Metabolic Disorder Labs: Lab Results  Component Value Date   HGBA1C 6.1 (H) 01/30/2023   MPG 128.37  01/30/2023   MPG 108 03/19/2010   Lab Results  Component Value Date   PROLACTIN 8.1 01/30/2023   Lab Results  Component Value Date   CHOL 165 01/30/2023   TRIG 58 01/30/2023   HDL 62 01/30/2023   CHOLHDL 2.7 01/30/2023   VLDL 12 01/30/2023   LDLCALC 91 01/30/2023   LDLCALC  03/20/2010    38        Total Cholesterol/HDL:CHD Risk Coronary Heart Disease Risk Table                     Men   Women  1/2 Average Risk   3.4   3.3  Average Risk       5.0   4.4  2 X Average Risk   9.6   7.1  3 X Average Risk  23.4   11.0        Use the calculated Patient Ratio above and the CHD Risk Table to determine the patient's CHD Risk.        ATP III CLASSIFICATION (LDL):  <100     mg/dL   Optimal  161-096  mg/dL   Near or Above                    Optimal  130-159  mg/dL   Borderline  045-409  mg/dL   High  >811     mg/dL   Very High    Physical Findings: AIMS:  , ,  ,  ,    CIWA:    COWS:     Musculoskeletal: Strength & Muscle Tone: within normal limits Gait & Station: unsteady Patient leans: N/A  Psychiatric Specialty Exam:  Presentation  General Appearance:  Casual  Eye Contact: Fair  Speech: Normal Rate; Slow  Speech Volume: Decreased  Handedness: Right   Mood and Affect  Mood: Depressed  Affect: Appropriate; Congruent   Thought Process  Thought Processes: Coherent  Descriptions of Associations:Intact  Orientation:Full (Time, Place and Person)  Thought Content:Logical  History of Schizophrenia/Schizoaffective disorder:No  Duration of Psychotic Symptoms:-- (unknown- could not be assessed due to pt's emotional state)  Hallucinations:No data recorded Ideas of Reference:None  Suicidal Thoughts:No data recorded Homicidal Thoughts:No data recorded  Sensorium  Memory: Immediate Fair; Remote  Fair  Judgment: Fair  Insight: Fair   Art therapist  Concentration: Fair  Attention Span: Fair  Recall: [employer] of Knowledge: Fair  Language: Fair   Psychomotor Activity  Psychomotor Activity:No data recorded  Assets  Assets: Communication Skills; Desire for Improvement   Sleep  Sleep:No data recorded   Physical Exam: Physical Exam Vitals and nursing note reviewed.  Constitutional:      Appearance: Normal appearance.  HENT:     Head: Normocephalic and atraumatic.     Mouth/Throat:     Pharynx: Oropharynx is clear.  Eyes:     Pupils: Pupils are equal, round, and reactive to light.  Cardiovascular:     Rate and Rhythm: Normal rate and regular rhythm.  Pulmonary:     Effort: Pulmonary effort is normal.     Breath sounds: Normal breath sounds.  Abdominal:     General: Abdomen is flat.     Palpations: Abdomen is soft.  Genitourinary:    Comments: I did not directly examine the patient but she continues to report rectal prolapse which at this point she says is not reducing and is out even when she is walking around. Musculoskeletal:  General: Normal range of motion.  Skin:    General: Skin is warm and dry.  Neurological:     General: No focal deficit present.     Mental Status: She is alert. Mental status is at baseline.  Psychiatric:        Attention and Perception: Attention normal.        Mood and Affect: Mood normal.        Speech: Speech normal.        Behavior: Behavior normal.        Thought Content: Thought content normal.        Cognition and Memory: Cognition normal.        Judgment: Judgment normal.    Review of Systems  Constitutional: Negative.   HENT: Negative.    Eyes: Negative.   Respiratory: Negative.    Cardiovascular: Negative.   Gastrointestinal: Negative.        Patient complains of constant pain at this point from rectal prolapse  Musculoskeletal: Negative.   Skin: Negative.   Neurological: Negative.    Psychiatric/Behavioral: Negative.     Blood pressure 100/81, pulse (!) 108, temperature 97.9 F (36.6 C), temperature source Oral, resp. rate 18, height 5\' 5"  (1.651 m), weight 56.7 kg, last menstrual period 10/13/2011, SpO2 94 %. Body mass index is 20.8 kg/m.   Treatment Plan Summary: Medication management and Plan increase Depakote dose to 500 mg twice a day because of the low level.  No other change to psychiatric medicine.  Psychiatrically seems to be greatly improved and probably appropriate for potential discharge.  As has been documented, we have been working for several days to find an appropriate answer to the problem of her rectal prolapse.  I have spoken with several different specialties including general surgery, OB/GYN and gastroenterology none of whom have been able to offer specific assistance.  We contacted surgery today.  It appears that surgical management of this problem is not something that is in the scope of practice of General surgery and does require a colorectal specialist.  I have asked the surgical consultant to take a look at the patient and comment about whether this would be an appropriate situation to request a direct referral to [facility] in [place] for surgical management.  I understand that this general type of a problem is usually managed outpatient but the patient is homeless and is at very high risk of not following up due to lack of transportation and resources.  Additionally she is complaining of constant pain and impairment in her ability to do daily activities.  For example she is requesting today a wheelchair because she says the prolapses out and is impairing of her ability to walk at baseline.  Spoke with nursing in a wheelchair will be provided.  Mordecai Rasmussen, MD 02/05/2023, 9:48 AM

## 2023-02-05 NOTE — Consult Note (Signed)
Subjective:   CC: Rectal prolapse  HPI:  Stacy Bolton is a 52 y.o. female who was consulted by [facility] for evaluation of above.  First noted several years ago.  Patient reports previous trauma as cause of this chronic issue.  She mainly endorses pain and discomfort along with light bleeding from the area.  She has been able to reduce in the past but thinks it has become more difficult to do so.  Has issues with having regular bowel movements due to the anxiety of increased discomfort associated with the prolapse.   Past Medical History:  has a past medical history of Anxiety, Asthma, COPD (chronic obstructive pulmonary disease) (HCC), Dyspnea, Endometriosis, and Seizure (HCC).  Past Surgical History:  has a past surgical history that includes Cholecystectomy.  Family History: family history is not on file.  Social History:  reports that she has been smoking cigarettes. She has a 45.00 pack-year smoking history. She does not have any smokeless tobacco history on file. She reports that she does not currently use alcohol. She reports current drug use. Drugs: Cocaine and Marijuana.  Current Medications:  Prior to Admission medications   Medication Sig Start Date End Date Taking? Authorizing Provider  albuterol (PROVENTIL HFA) 108 (90 Base) MCG/ACT inhaler Inhale 2 puffs into the lungs every 4 (four) hours as needed for wheezing or shortness of breath. 12/30/22  Yes Sharman Cheek, MD  albuterol-ipratropium [facility]) 18-103 MCG/ACT inhaler Inhale 2 puffs into the lungs every 6 (six) hours as needed. For shortness of breath    Yes [provider]  budesonide (PULMICORT) 0.5 MG/2ML nebulizer solution Take 0.5 mg by nebulization 2 (two) times daily. From hospital stay in May 2024   Yes [provider]  divalproex (DEPAKOTE ER) 500 MG 24 hr tablet Take 500 mg by mouth daily.   Yes [provider]  gabapentin (NEURONTIN) 100 MG capsule Take 100 mg by mouth 3  (three) times daily.   Yes [provider]  pantoprazole (PROTONIX) 40 MG tablet Take 40 mg by mouth daily.   Yes [provider]  risperiDONE (RISPERDAL) 1 MG tablet Take 1 mg by mouth at bedtime.   Yes [provider]  Fluticasone-Salmeterol (ADVAIR DISKUS) 250-50 MCG/DOSE AEPB Inhale 1 puff into the lungs every 12 (twelve) hours. 1 puff inhaled twice daily  Patient not taking: Reported on 01/31/2023    [provider]    Allergies:  Allergies  Allergen Reactions   Hydrocodone-Acetaminophen     REACTION: itch   Hydromorphone Hcl     REACTION: itching   Oxycodone-Acetaminophen Itching    ROS:  General: Denies weight loss, weight gain, fatigue, fevers, chills, and night sweats. Eyes: Denies blurry vision, double vision, eye pain, itchy eyes, and tearing. Ears: Denies hearing loss, earache, and ringing in ears. Nose: Denies sinus pain, congestion, infections, runny nose, and nosebleeds. Mouth/throat: Denies hoarseness, sore throat, bleeding gums, and difficulty swallowing. Heart: Denies chest pain, palpitations, racing heart, irregular heartbeat, leg pain or swelling, and decreased activity tolerance. Respiratory: Denies breathing difficulty, shortness of breath, wheezing, cough, and sputum. GI: Denies change in appetite, heartburn, nausea, vomiting, diarrhea, and blood in stool. GU: Denies difficulty urinating, pain with urinating, urgency, frequency, blood in urine. Musculoskeletal: Denies joint stiffness, pain, swelling, muscle weakness. Skin: Denies rash, itching, mass, tumors, sores, and boils Neurologic: Denies headache, fainting, dizziness, seizures, numbness, and tingling. Psychiatric: Denies depression, anxiety, difficulty sleeping, and memory loss. Endocrine: Denies heat or cold intolerance, and increased thirst  or urination. Blood/lymph: Denies easy bruising, easy bruising, and swollen glands     Objective:     BP 100/81 (BP Location:  Right Arm)   Pulse (!) 108   Temp 97.9 F (36.6 C) (Oral)   Resp 18   Ht 5\' 5"  (1.651 m)   Wt 56.7 kg   LMP 10/13/2011   SpO2 94%   BMI 20.80 kg/m   Constitutional :  alert, cooperative, appears stated age, and no distress  Lymphatics/Throat:  no asymmetry, masses, or scars  Respiratory:  clear to auscultation bilaterally  Cardiovascular:  regular rate and rhythm  Gastrointestinal: soft, non-tender; bowel sounds normal; no masses,  no organomegaly.  Musculoskeletal: Steady gait and movement  Skin: Cool and moist  Psychiatric: Normal affect, non-agitated, not confused  Rectal: Chaperone present for exam. Obvious rectal prolapse with concentric rings of mucosa noted.  Extent of prolapse measures roughly 10cm.  No obvious mucosal injury or pathology.  No active bleeding.  Entire prolapsed rectum is easily reducible at bedside with gentle pressure.  No obvious recurrence while patient is laying down.    LABS:     Latest Ref Rng & Units 01/30/2023    5:41 PM 12/30/2022   12:08 PM 12/29/2022    8:39 PM  CMP  Glucose 70 - 99 mg/dL 829  562  79   BUN 6 - 20 mg/dL 16  10  10    Creatinine 0.44 - 1.00 mg/dL 1.30  8.65  7.84   Sodium 135 - 145 mmol/L 134  140  143   Potassium 3.5 - 5.1 mmol/L 3.7  3.6  3.0   Chloride 98 - 111 mmol/L 95  106  102   CO2 22 - 32 mmol/L 29  28  30    Calcium 8.9 - 10.3 mg/dL 8.7  9.1  8.8   Total Protein 6.5 - 8.1 g/dL 6.1   6.1   Total Bilirubin 0.3 - 1.2 mg/dL 0.9   0.6   Alkaline Phos 38 - 126 U/L 83   96   AST 15 - 41 U/L 23   21   ALT 0 - 44 U/L 24   15       Latest Ref Rng & Units 01/30/2023    5:41 PM 12/30/2022   12:08 PM 12/29/2022    8:39 PM  CBC  WBC 4.0 - 10.5 K/uL 7.4  6.3  6.3   Hemoglobin 12.0 - 15.0 g/dL 69.6  29.5  28.4   Hematocrit 36.0 - 46.0 % 42.6  42.8  45.6   Platelets 150 - 400 K/uL 330  239  265     RADS: N/a  Assessment:      Rectal prolapse.  Plan:     Extent of the rectal prolapse is severe but fortunately still easily  reducible.  Understanding this is a chronic issue there is no urgent medical necessity for immediate intervention.  However, due to the degree of symptomatology, the patient will definitely benefit from consultation with a colorectal surgeon who may be able to offer treatment for this condition.  Surgical repair of rectal prolapse is beyond the scope of practice for the general surgery service here at [facility].    Encourage patient to continue to manually reduce the prolapse as needed, and minimize straining and other activities that make the prolapse worse.  Surgery to sign off.  Please call with any additional questions or concerns.   The patient verbalized understanding and all questions were answered to  the patient's satisfaction.  labs/images/medications/previous chart entries reviewed personally and relevant changes/updates noted above.

## 2023-02-05 NOTE — Plan of Care (Signed)

## 2023-02-06 ENCOUNTER — Other Ambulatory Visit: Payer: Self-pay | Admitting: Surgery

## 2023-02-06 DIAGNOSIS — K623 Rectal prolapse: Secondary | ICD-10-CM

## 2023-02-06 MED ORDER — GUAIFENESIN-DM 100-10 MG/5ML PO SYRP
5.0000 mL | ORAL_SOLUTION | ORAL | Status: DC | PRN
  Administered 2023-02-06: 5 mL via ORAL
  Filled 2023-02-06: qty 10

## 2023-02-06 MED ORDER — MAGNESIUM CITRATE PO SOLN
1.0000 | Freq: Every day | ORAL | Status: DC | PRN
  Administered 2023-02-06 – 2023-02-08 (×2): 1 via ORAL
  Filled 2023-02-06 (×2): qty 296
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
## 2023-02-06 NOTE — Plan of Care (Signed)
  Problem: Coping: Goal: Level of anxiety will decrease Outcome: Progressing   Problem: Elimination: Goal: Will not experience complications related to bowel motility Outcome: Not Progressing   Problem: Skin Integrity: Goal: Risk for impaired skin integrity will decrease Outcome: Not Progressing

## 2023-02-06 NOTE — Group Note (Signed)
[facility] LCSW Group Therapy Note   Group Date: 02/06/2023 Start Time: 1310 End Time: 1420   Type of Therapy/Topic:  Group Therapy:  Emotion Regulation  Participation Level:  Did Not Attend    Description of Group:    The purpose of this group is to assist patients in learning to regulate negative emotions and experience positive emotions. Patients will be guided to discuss ways in which they have been vulnerable to their negative emotions. These vulnerabilities will be juxtaposed with experiences of positive emotions or situations, and patients challenged to use positive emotions to combat negative ones. Special emphasis will be placed on coping with negative emotions in conflict situations, and patients will process healthy conflict resolution skills.  Therapeutic Goals: Patient will identify two positive emotions or experiences to reflect on in order to balance out negative emotions:  Patient will label two or more emotions that they find the most difficult to experience:  Patient will be able to demonstrate positive conflict resolution skills through discussion or role plays:   Summary of Patient Progress: X   Therapeutic Modalities:   Cognitive Behavioral Therapy Feelings Identification Dialectical Behavioral Therapy   Glenis Smoker, LCSW

## 2023-02-06 NOTE — Progress Notes (Signed)
[facility] MD Progress Note  02/06/2023 11:31 AM Stacy Bolton  MRN:  [id number] Subjective: Patient seen for follow-up.  Psychiatrically she is pretty stable.  No longer showing manic signs.  She is unhappy about her physical situation but not reporting suicidal thoughts.  Continues to complain of pretty much constant discomfort from the rectal prolapse. Principal Problem: Bipolar I disorder, most recent episode (or current) manic (HCC) Diagnosis: Principal Problem:   Bipolar I disorder, most recent episode (or current) manic (HCC) Active Problems:   COPD (chronic obstructive pulmonary disease) (HCC)   Cocaine abuse (HCC)  Total Time spent with patient: 30 minutes  Past Psychiatric History: Past history of bipolar disorder and substance use  Past Medical History:  Past Medical History:  Diagnosis Date   Anxiety    Asthma    COPD (chronic obstructive pulmonary disease) (HCC)    Dyspnea    Endometriosis    Seizure (HCC)     Past Surgical History:  Procedure Laterality Date   CHOLECYSTECTOMY     Family History: History reviewed. No pertinent family history. Family Psychiatric  History: See previous Social History:  Social History   Substance and Sexual Activity  Alcohol Use Not Currently     Social History   Substance and Sexual Activity  Drug Use Yes   Types: Cocaine, Marijuana   Comment: EVERY THREE DAYS    Social History   Socioeconomic History   Marital status: Single    Spouse name: Not on file   Number of children: Not on file   Years of education: Not on file   Highest education level: Not on file  Occupational History   Not on file  Tobacco Use   Smoking status: Every Day    Packs/day: 1.50    Years: 30.00    Additional pack years: 0.00    Total pack years: 45.00    Types: Cigarettes   Smokeless tobacco: Not on file  Vaping Use   Vaping Use: Never used  Substance and Sexual Activity   Alcohol use: Not Currently   Drug use: Yes    Types:  Cocaine, Marijuana    Comment: EVERY THREE DAYS   Sexual activity: Not Currently  Other Topics Concern   Not on file  Social History Narrative   Not on file   Social Determinants of Health   Financial Resource Strain: Not on file  Food Insecurity: No Food Insecurity (01/31/2023)   Hunger Vital Sign    Worried About Running Out of Food in the Last Year: Never true    Ran Out of Food in the Last Year: Never true  Transportation Needs: No Transportation Needs (01/31/2023)   PRAPARE - Administrator, Civil Service (Medical): No    Lack of Transportation (Non-Medical): No  Physical Activity: Not on file  Stress: Not on file  Social Connections: Not on file   Additional Social History:                         Sleep: Fair  Appetite:  Fair  Current Medications: Current Facility-Administered Medications  Medication Dose Route Frequency Provider Last Rate Last Admin   albuterol (VENTOLIN HFA) 108 (90 Base) MCG/ACT inhaler 2 puff  2 puff Inhalation Q4H PRN Bing Neighbors, NP   2 puff at 02/04/23 0930   alum & mag hydroxide-simeth (MAALOX/MYLANTA) 200-200-20 MG/5ML suspension 30 mL  30 mL Oral Q4H PRN Onuoha, Chinwendu V, NP  30 mL at 02/05/23 1236   divalproex (DEPAKOTE ER) 24 hr tablet 500 mg  500 mg Oral BID Bradshaw Minihan T, MD   500 mg at 02/06/23 0830   feeding supplement (ENSURE ENLIVE / ENSURE PLUS) liquid 237 mL  237 mL Oral BID BM Lewis Keats T, MD   237 mL at 02/06/23 0831   fluticasone furoate-vilanterol (BREO ELLIPTA) 200-25 MCG/ACT 1 puff  1 puff Inhalation Daily Bing Neighbors, NP   1 puff at 02/06/23 0831   haloperidol (HALDOL) tablet 5 mg  5 mg Oral TID PRN Onuoha, Chinwendu V, NP       Or   haloperidol lactate (HALDOL) injection 5 mg  5 mg Intramuscular TID PRN Onuoha, Chinwendu V, NP       hydrOXYzine (ATARAX) tablet 25 mg  25 mg Oral TID PRN Bing Neighbors, NP   25 mg at 02/01/23 1750   LORazepam (ATIVAN) tablet 2 mg  2 mg Oral Q6H PRN  Chaos Carlile, Jackquline Denmark, MD   2 mg at 02/05/23 1559   magnesium citrate solution 1 Bottle  1 Bottle Oral Daily PRN Etheline Geppert T, MD       magnesium hydroxide (MILK OF MAGNESIA) suspension 30 mL  30 mL Oral Daily PRN Onuoha, Chinwendu V, NP   30 mL at 02/05/23 1556   multivitamin with minerals tablet 1 tablet  1 tablet Oral Daily Miroslav Gin, Jackquline Denmark, MD   1 tablet at 02/06/23 0830   nicotine (NICODERM CQ - dosed in mg/24 hours) patch 21 mg  21 mg Transdermal Daily Kirsten Spearing, Jackquline Denmark, MD   21 mg at 02/06/23 1610   nicotine polacrilex (NICORETTE) gum 2 mg  2 mg Oral PRN Danylle Ouk T, MD   2 mg at 01/31/23 1408   PARoxetine (PAXIL) tablet 10 mg  10 mg Oral Daily Malik, Kashif   10 mg at 02/06/23 0830   psyllium (HYDROCIL/METAMUCIL) 1 packet  1 packet Oral Daily Adriaan Maltese, Jackquline Denmark, MD   1 packet at 02/06/23 0830   risperiDONE (RISPERDAL) tablet 2 mg  2 mg Oral QHS Takela Varden T, MD   2 mg at 02/04/23 2108   traMADol (ULTRAM) tablet 50 mg  50 mg Oral Q6H PRN Blaize Nipper, Jackquline Denmark, MD   50 mg at 02/05/23 1435   traZODone (DESYREL) tablet 50 mg  50 mg Oral QHS PRN Bing Neighbors, NP   50 mg at 02/04/23 2109    Lab Results:  Results for orders placed or performed during the hospital encounter of 01/31/23 (from the past 48 hour(s))  Valproic acid level     Status: Abnormal   Collection Time: 02/05/23  7:35 AM  Result Value Ref Range   Valproic Acid Lvl 38 (L) 50.0 - 100.0 ug/mL    Comment: Performed at [facility], [address] [place], [place] [address]    Blood Alcohol level:  Lab Results  Component Value Date   ETH <10 01/30/2023   [facility]  03/19/2010    <5        LOWEST DETECTABLE LIMIT FOR SERUM ALCOHOL IS 5 mg/dL FOR MEDICAL PURPOSES ONLY    Metabolic Disorder Labs: Lab Results  Component Value Date   HGBA1C 6.1 (H) 01/30/2023   MPG 128.37 01/30/2023   MPG 108 03/19/2010   Lab Results  Component Value Date   PROLACTIN 8.1 01/30/2023   Lab Results  Component Value Date   CHOL  165 01/30/2023   TRIG 58 01/30/2023  HDL 62 01/30/2023   CHOLHDL 2.7 01/30/2023   VLDL 12 01/30/2023   LDLCALC 91 01/30/2023   LDLCALC  03/20/2010    38        Total Cholesterol/HDL:CHD Risk Coronary Heart Disease Risk Table                     Men   Women  1/2 Average Risk   3.4   3.3  Average Risk       5.0   4.4  2 X Average Risk   9.6   7.1  3 X Average Risk  23.4   11.0        Use the calculated Patient Ratio above and the CHD Risk Table to determine the patient's CHD Risk.        ATP III CLASSIFICATION (LDL):  <100     mg/dL   Optimal  161-096  mg/dL   Near or Above                    Optimal  130-159  mg/dL   Borderline  045-409  mg/dL   High  >811     mg/dL   Very High    Physical Findings: AIMS:  , ,  ,  ,    CIWA:    COWS:     Musculoskeletal: Strength & Muscle Tone: within normal limits Gait & Station: normal Patient leans: N/A  Psychiatric Specialty Exam:  Presentation  General Appearance:  Casual  Eye Contact: Fair  Speech: Normal Rate; Slow  Speech Volume: Decreased  Handedness: Right   Mood and Affect  Mood: Depressed  Affect: Appropriate; Congruent   Thought Process  Thought Processes: Coherent  Descriptions of Associations:Intact  Orientation:Full (Time, Place and Person)  Thought Content:Logical  History of Schizophrenia/Schizoaffective disorder:No  Duration of Psychotic Symptoms:-- (unknown- could not be assessed due to pt's emotional state)  Hallucinations:No data recorded Ideas of Reference:None  Suicidal Thoughts:No data recorded Homicidal Thoughts:No data recorded  Sensorium  Memory: Immediate Fair; Remote Fair  Judgment: Fair  Insight: Fair   Art therapist  Concentration: Fair  Attention Span: Fair  Recall: [employer] of Knowledge: Fair  Language: Fair   Psychomotor Activity  Psychomotor Activity:No data recorded  Assets  Assets: Communication Skills; Desire for  Improvement   Sleep  Sleep:No data recorded   Physical Exam: Physical Exam Vitals and nursing note reviewed.  Constitutional:      Appearance: Normal appearance.  HENT:     Head: Normocephalic and atraumatic.     Mouth/Throat:     Pharynx: Oropharynx is clear.  Eyes:     Pupils: Pupils are equal, round, and reactive to light.  Cardiovascular:     Rate and Rhythm: Normal rate and regular rhythm.  Pulmonary:     Effort: Pulmonary effort is normal.     Breath sounds: Normal breath sounds.  Abdominal:     General: Abdomen is flat.     Palpations: Abdomen is soft.  Genitourinary:    Comments: Not directly examined but patient reports that prolapse continues to be a nearly constant problem and causing significant pain. Musculoskeletal:        General: Normal range of motion.  Skin:    General: Skin is warm and dry.  Neurological:     General: No focal deficit present.     Mental Status: She is alert. Mental status is at baseline.  Psychiatric:  Attention and Perception: Attention normal.        Mood and Affect: Mood normal. Affect is blunt.        Speech: Speech normal.        Behavior: Behavior is cooperative.        Thought Content: Thought content normal.        Cognition and Memory: Cognition normal.        Judgment: Judgment normal.    Review of Systems  Constitutional: Negative.   HENT: Negative.    Eyes: Negative.   Respiratory: Negative.    Cardiovascular: Negative.   Gastrointestinal: Negative.        Complaining of significant pain pretty much constantly especially since now she is having some constipation.  Probably at least part of that is triggered by fear of the pain.  Musculoskeletal: Negative.   Skin: Negative.   Neurological: Negative.   Psychiatric/Behavioral:  Negative for depression, hallucinations, substance abuse and suicidal ideas. The patient is nervous/anxious.    Blood pressure (!) 122/112, pulse 89, temperature 98.4 F (36.9 C),  temperature source Oral, resp. rate 18, height 5\' 5"  (1.651 m), weight 56.7 kg, last menstrual period 10/13/2011, SpO2 93 %. Body mass index is 20.8 kg/m.   Treatment Plan Summary: Medication management and Plan psychiatrically stable.  We have communicated with colorectal surgeons in [place] and it appears inevitable that the appropriate treatment recommendation will be for an outpatient appointment.  We are working on getting that scheduled with [facility] [place] [facility].  Encourage patient.  Nursing suggested offering the patient magnesium citrate for her constipation.  I explained that while this may create a painful bowel movement it is the thing most likely to actually cause a bowel movement and if that would be better than letting things get more constipated that is fine.  Patient is willing to give it a try.  Mordecai Rasmussen, MD 02/06/2023, 11:31 AM

## 2023-02-06 NOTE — Group Note (Signed)
Date:  02/06/2023 Time:  6:36 PM  Group Topic/Focus:  Activity Group    Participation Level:  Did Not Attend   Mary Sella Mayana Irigoyen 02/06/2023, 6:36 PM

## 2023-02-06 NOTE — BH IP Treatment Plan (Signed)
Interdisciplinary Treatment and Diagnostic Plan Update  02/06/2023 Time of Session: 8:30 AM Stacy Bolton MRN: [id number]  Principal Diagnosis: Bipolar I disorder, most recent episode (or current) manic (HCC)  Secondary Diagnoses: Principal Problem:   Bipolar I disorder, most recent episode (or current) manic (HCC) Active Problems:   COPD (chronic obstructive pulmonary disease) (HCC)   Cocaine abuse (HCC)   Current Medications:  Current Facility-Administered Medications  Medication Dose Route Frequency Provider Last Rate Last Admin   albuterol (VENTOLIN HFA) 108 (90 Base) MCG/ACT inhaler 2 puff  2 puff Inhalation Q4H PRN Bing Neighbors, NP   2 puff at 02/04/23 0930   alum & mag hydroxide-simeth (MAALOX/MYLANTA) 200-200-20 MG/5ML suspension 30 mL  30 mL Oral Q4H PRN Onuoha, Chinwendu V, NP   30 mL at 02/05/23 1236   divalproex (DEPAKOTE ER) 24 hr tablet 500 mg  500 mg Oral BID Clapacs, John T, MD   500 mg at 02/06/23 0830   feeding supplement (ENSURE ENLIVE / ENSURE PLUS) liquid 237 mL  237 mL Oral BID BM Clapacs, John T, MD   237 mL at 02/06/23 0831   fluticasone furoate-vilanterol (BREO ELLIPTA) 200-25 MCG/ACT 1 puff  1 puff Inhalation Daily Bing Neighbors, NP   1 puff at 02/06/23 0831   guaiFENesin-dextromethorphan (ROBITUSSIN DM) 100-10 MG/5ML syrup 5 mL  5 mL Oral Q4H PRN Clapacs, John T, MD       haloperidol (HALDOL) tablet 5 mg  5 mg Oral TID PRN Onuoha, Chinwendu V, NP       Or   haloperidol lactate (HALDOL) injection 5 mg  5 mg Intramuscular TID PRN Onuoha, Chinwendu V, NP       hydrOXYzine (ATARAX) tablet 25 mg  25 mg Oral TID PRN Bing Neighbors, NP   25 mg at 02/01/23 1750   LORazepam (ATIVAN) tablet 2 mg  2 mg Oral Q6H PRN Clapacs, Jackquline Denmark, MD   2 mg at 02/05/23 1559   magnesium citrate solution 1 Bottle  1 Bottle Oral Daily PRN Clapacs, John T, MD       magnesium hydroxide (MILK OF MAGNESIA) suspension 30 mL  30 mL Oral Daily PRN Onuoha, Chinwendu V, NP    30 mL at 02/05/23 1556   multivitamin with minerals tablet 1 tablet  1 tablet Oral Daily Clapacs, Jackquline Denmark, MD   1 tablet at 02/06/23 0830   nicotine (NICODERM CQ - dosed in mg/24 hours) patch 21 mg  21 mg Transdermal Daily Clapacs, Jackquline Denmark, MD   21 mg at 02/06/23 4098   nicotine polacrilex (NICORETTE) gum 2 mg  2 mg Oral PRN Clapacs, John T, MD   2 mg at 01/31/23 1408   PARoxetine (PAXIL) tablet 10 mg  10 mg Oral Daily Malik, Kashif   10 mg at 02/06/23 0830   psyllium (HYDROCIL/METAMUCIL) 1 packet  1 packet Oral Daily Clapacs, Jackquline Denmark, MD   1 packet at 02/06/23 0830   risperiDONE (RISPERDAL) tablet 2 mg  2 mg Oral QHS Clapacs, John T, MD   2 mg at 02/04/23 2108   traMADol (ULTRAM) tablet 50 mg  50 mg Oral Q6H PRN Clapacs, Jackquline Denmark, MD   50 mg at 02/05/23 1435   traZODone (DESYREL) tablet 50 mg  50 mg Oral QHS PRN Bing Neighbors, NP   50 mg at 02/04/23 2109   PTA Medications: Medications Prior to Admission  Medication Sig Dispense Refill Last Dose   albuterol (PROVENTIL HFA) 108 (90  Base) MCG/ACT inhaler Inhale 2 puffs into the lungs every 4 (four) hours as needed for wheezing or shortness of breath. 1 each 0 Past Month   albuterol-ipratropium (COMBIVENT) 18-103 MCG/ACT inhaler Inhale 2 puffs into the lungs every 6 (six) hours as needed. For shortness of breath    Past Month   budesonide (PULMICORT) 0.5 MG/2ML nebulizer solution Take 0.5 mg by nebulization 2 (two) times daily. From hospital stay in May 2024   Past Month   divalproex (DEPAKOTE ER) 500 MG 24 hr tablet Take 500 mg by mouth daily.   Past Month   gabapentin (NEURONTIN) 100 MG capsule Take 100 mg by mouth 3 (three) times daily.   Past Month   pantoprazole (PROTONIX) 40 MG tablet Take 40 mg by mouth daily.   Past Month   risperiDONE (RISPERDAL) 1 MG tablet Take 1 mg by mouth at bedtime.   Past Month   Fluticasone-Salmeterol (ADVAIR DISKUS) 250-50 MCG/DOSE AEPB Inhale 1 puff into the lungs every 12 (twelve) hours. 1 puff inhaled twice  daily  (Patient not taking: Reported on 01/31/2023)       Patient Stressors:    Patient Strengths:    Treatment Modalities: Medication Management, Group therapy, Case management,  1 to 1 session with clinician, Psychoeducation, Recreational therapy.   Physician Treatment Plan for Primary Diagnosis: Bipolar I disorder, most recent episode (or current) manic (HCC) Long Term Goal(s): Improvement in symptoms so as ready for discharge   Short Term Goals: Ability to maintain clinical measurements within normal limits will improve Compliance with prescribed medications will improve Ability to verbalize feelings will improve Ability to demonstrate self-control will improve Ability to identify and develop effective coping behaviors will improve  Medication Management: Evaluate patient's response, side effects, and tolerance of medication regimen.  Therapeutic Interventions: 1 to 1 sessions, Unit Group sessions and Medication administration.  Evaluation of Outcomes: Progressing  Physician Treatment Plan for Secondary Diagnosis: Principal Problem:   Bipolar I disorder, most recent episode (or current) manic (HCC) Active Problems:   COPD (chronic obstructive pulmonary disease) (HCC)   Cocaine abuse (HCC)  Long Term Goal(s): Improvement in symptoms so as ready for discharge   Short Term Goals: Ability to maintain clinical measurements within normal limits will improve Compliance with prescribed medications will improve Ability to verbalize feelings will improve Ability to demonstrate self-control will improve Ability to identify and develop effective coping behaviors will improve     Medication Management: Evaluate patient's response, side effects, and tolerance of medication regimen.  Therapeutic Interventions: 1 to 1 sessions, Unit Group sessions and Medication administration.  Evaluation of Outcomes: Progressing   RN Treatment Plan for Primary Diagnosis: Bipolar I disorder, most  recent episode (or current) manic (HCC) Long Term Goal(s): Knowledge of disease and therapeutic regimen to maintain health will improve  Short Term Goals: Ability to demonstrate self-control, Ability to participate in decision making will improve, Ability to verbalize feelings will improve, Ability to disclose and discuss suicidal ideas, Ability to identify and develop effective coping behaviors will improve, and Compliance with prescribed medications will improve  Medication Management: RN will administer medications as ordered by provider, will assess and evaluate patient's response and provide education to patient for prescribed medication. RN will report any adverse and/or side effects to prescribing provider.  Therapeutic Interventions: 1 on 1 counseling sessions, Psychoeducation, Medication administration, Evaluate responses to treatment, Monitor vital signs and CBGs as ordered, Perform/monitor CIWA, COWS, AIMS and Fall Risk screenings as ordered, Perform wound care treatments  as ordered.  Evaluation of Outcomes: Progressing   LCSW Treatment Plan for Primary Diagnosis: Bipolar I disorder, most recent episode (or current) manic (HCC) Long Term Goal(s): Safe transition to appropriate next level of care at discharge, Engage patient in therapeutic group addressing interpersonal concerns.  Short Term Goals: Engage patient in aftercare planning with referrals and resources, Increase social support, Increase ability to appropriately verbalize feelings, Increase emotional regulation, Facilitate acceptance of mental health diagnosis and concerns, and Increase skills for wellness and recovery  Therapeutic Interventions: Assess for all discharge needs, 1 to 1 time with Social worker, Explore available resources and support systems, Assess for adequacy in community support network, Educate family and significant other(s) on suicide prevention, Complete Psychosocial Assessment, Interpersonal group  therapy.  Evaluation of Outcomes: Progressing   Progress in Treatment: Attending groups: No. Participating in groups: No. Taking medication as prescribed: Yes. Toleration medication: Yes. Family/Significant other contact made: Yes, individual(s) contacted:  SPE completed with patient's sister Patient understands diagnosis: Yes. Discussing patient identified problems/goals with staff: Yes. Medical problems stabilized or resolved: Yes. Denies suicidal/homicidal ideation: Yes. Issues/concerns per patient self-inventory: Yes.  Patient continues to report concerns on her prolapsed rectum.  She has been advised to address this in an outpatient setting and provided provider information. Other: none  New problem(s) identified: No, Describe:  none  Update 02/06/2023:  No changes at this time.   New Short Term/Long Term Goal(s): detox, elimination of symptoms of psychosis, medication management for mood stabilization; elimination of SI thoughts; development of comprehensive mental wellness/sobriety plan. Update 02/06/2023:  No changes at this time.   Patient Goals:  "get all my ailments intact so I can function properly" Update 02/06/2023:  No changes at this time.   Discharge Plan or Barriers: CSW to assist patient in development of appropriate discharge plans. Update 02/06/2023:  Patient reports plans to return to the [facility].  She has been provided contact information to see an provider to address her need for colorectal surgery.  CSW will provide additional resources as needed.    Reason for Continuation of Hospitalization: Anxiety Depression Medical Issues Medication stabilization Suicidal ideation   Estimated Length of Stay:  1-7 days Update 02/06/2023:  TBD  Last 3 [place] Suicide Severity Risk Score: Flowsheet Row Admission (Current) from 01/31/2023 in [facility] ED from 01/30/2023 in [facility] ED from 12/31/2022 in [facility] Emergency  Department at [facility]  C-SSRS RISK CATEGORY No Risk Error: Question 2 not populated No Risk       Last PHQ 2/9 Scores:     No data to display          Scribe for Treatment Team: Harden Mo, LCSW 02/06/2023 1:51 PM

## 2023-02-06 NOTE — Progress Notes (Signed)
Patient is pleasant and cooperative. Complains and cries out of pain to rectum. Patient reports trying have a bowel movement. Prune juice provided. Prns given prior shift, no relief noted. Denies SI, HI, AVH. Refused pain medication and night meds, States stomach is hurting to bad. Encouragement and support provided. Safety checks maintained. Medication given as prescribed. Pt receptive and remains safe on unit with q 15 min checks.

## 2023-02-06 NOTE — Progress Notes (Signed)
D- Patient alert and oriented x 4. Affect flat/mood depressed. Denies SI/ HI/ AVH. Patient complains of acute chronic pain of rectal prolapse, 10/10 with Tramadol administered PRN down to 8/10. Patient endorses depression and anxiety. Her goal is to have "surgery". Referral to [facility] for appointment made. No return call as of yet. A- Scheduled and PRN medications administered to patient, per MD orders. Support and encouragement provided.  Routine safety checks conducted every 15 minutes without incident.  Patient informed to notify staff with problems or concerns and verbalizes understanding. R- No adverse drug reactions noted.  Patient compliant with medications and treatment plan. Patient receptive, calm cooperative. She is isolative to her room and ate in her room secondary to rectal prolapse pain.  Patient contracts for safety and  remains safe on the unit at this time.

## 2023-02-07 NOTE — Group Note (Signed)
Date:  02/07/2023 Time:  4:23 PM  Group Topic/Focus:  Goals Group:   The focus of this group is to help patients establish daily goals to achieve during treatment and discuss how the patient can incorporate goal setting into their daily lives to aide in recovery. Personal Choices and Values:   The focus of this group is to help patients assess and explore the importance of values in their lives, how their values affect their decisions, how they express their values and what opposes their expression. Self Care:   The focus of this group is to help patients understand the importance of self-care in order to improve or restore emotional, physical, spiritual, interpersonal, and financial health.    Participation Level:  Did Not Attend   Doug Sou 02/07/2023, 4:23 PM

## 2023-02-07 NOTE — Progress Notes (Signed)
   02/07/23 1610  15 Minute Checks  Location Bedroom  Visual Appearance Calm  Behavior Sleeping  Sleep (Behavioral Health Patients Only)  Calculate sleep? (Click Yes once per 24 hr at 0600 safety check) Yes  Documented sleep last 24 hours 9.75

## 2023-02-07 NOTE — Progress Notes (Signed)
Pt is calm and cooperative, compliant with medication, provided with PRN for sleep and anxiety.  Pt was encouraged to drink fluids.  Gatorade provided.  Pt denied SI HI AVH.  Pt denied pain.  Continued monitoring for safety.  02/07/23 2300  Psych Admission Type (Psych Patients Only)  Admission Status Voluntary  Psychosocial Assessment  Patient Complaints Depression  Eye Contact Fair  Facial Expression Pained  Affect Appropriate to circumstance  Speech Soft  Interaction Assertive  Motor Activity Slow  Appearance/Hygiene In scrubs  Behavior Characteristics Appropriate to situation  Mood Depressed  Thought Process  Coherency WDL  Content WDL  Delusions None reported or observed  Perception WDL  Hallucination None reported or observed  Judgment WDL  Confusion None  Danger to Self  Current suicidal ideation? Denies  Agreement Not to Harm Self Yes  Description of Agreement verbally  Danger to Others  Danger to Others None reported or observed

## 2023-02-07 NOTE — Progress Notes (Signed)
[facility] MD Progress Note  02/07/2023 10:25 AM Stacy Bolton  MRN:  [id number] Subjective: Patient seen and chart reviewed.  Magnesium citrate was effective yesterday.  Pain is somewhat diminished.  Patient is still staying in bed most of the time.  Mood is stable.  Not manic not depressed not suicidal but still having a lot of physical complaints Principal Problem: Bipolar I disorder, most recent episode (or current) manic (HCC) Diagnosis: Principal Problem:   Bipolar I disorder, most recent episode (or current) manic (HCC) Active Problems:   COPD (chronic obstructive pulmonary disease) (HCC)   Cocaine abuse (HCC)  Total Time spent with patient: 30 minutes  Past Psychiatric History: Past history of bipolar disorder and substance use  Past Medical History:  Past Medical History:  Diagnosis Date   Anxiety    Asthma    COPD (chronic obstructive pulmonary disease) (HCC)    Dyspnea    Endometriosis    Seizure (HCC)     Past Surgical History:  Procedure Laterality Date   CHOLECYSTECTOMY     Family History: History reviewed. No pertinent family history. Family Psychiatric  History: See previous Social History:  Social History   Substance and Sexual Activity  Alcohol Use Not Currently     Social History   Substance and Sexual Activity  Drug Use Yes   Types: Cocaine, Marijuana   Comment: EVERY THREE DAYS    Social History   Socioeconomic History   Marital status: Single    Spouse name: Not on file   Number of children: Not on file   Years of education: Not on file   Highest education level: Not on file  Occupational History   Not on file  Tobacco Use   Smoking status: Every Day    Packs/day: 1.50    Years: 30.00    Additional pack years: 0.00    Total pack years: 45.00    Types: Cigarettes   Smokeless tobacco: Not on file  Vaping Use   Vaping Use: Never used  Substance and Sexual Activity   Alcohol use: Not Currently   Drug use: Yes    Types: Cocaine,  Marijuana    Comment: EVERY THREE DAYS   Sexual activity: Not Currently  Other Topics Concern   Not on file  Social History Narrative   Not on file   Social Determinants of Health   Financial Resource Strain: Not on file  Food Insecurity: No Food Insecurity (01/31/2023)   Hunger Vital Sign    Worried About Running Out of Food in the Last Year: Never true    Ran Out of Food in the Last Year: Never true  Transportation Needs: No Transportation Needs (01/31/2023)   PRAPARE - Administrator, Civil Service (Medical): No    Lack of Transportation (Non-Medical): No  Physical Activity: Not on file  Stress: Not on file  Social Connections: Not on file   Additional Social History:                         Sleep: Fair  Appetite:  Fair  Current Medications: Current Facility-Administered Medications  Medication Dose Route Frequency Provider Last Rate Last Admin   albuterol (VENTOLIN HFA) 108 (90 Base) MCG/ACT inhaler 2 puff  2 puff Inhalation Q4H PRN Bing Neighbors, NP   2 puff at 02/07/23 0828   alum & mag hydroxide-simeth (MAALOX/MYLANTA) 200-200-20 MG/5ML suspension 30 mL  30 mL Oral Q4H PRN Onuoha, Chinwendu  V, NP   30 mL at 02/05/23 1236   divalproex (DEPAKOTE ER) 24 hr tablet 500 mg  500 mg Oral BID Macalister Arnaud T, MD   500 mg at 02/07/23 4098   feeding supplement (ENSURE ENLIVE / ENSURE PLUS) liquid 237 mL  237 mL Oral BID BM Traquan Duarte T, MD   237 mL at 02/07/23 1016   fluticasone furoate-vilanterol (BREO ELLIPTA) 200-25 MCG/ACT 1 puff  1 puff Inhalation Daily Bing Neighbors, NP   1 puff at 02/07/23 0837   guaiFENesin-dextromethorphan (ROBITUSSIN DM) 100-10 MG/5ML syrup 5 mL  5 mL Oral Q4H PRN Haile Bosler T, MD   5 mL at 02/06/23 1358   haloperidol (HALDOL) tablet 5 mg  5 mg Oral TID PRN Onuoha, Chinwendu V, NP       Or   haloperidol lactate (HALDOL) injection 5 mg  5 mg Intramuscular TID PRN Onuoha, Chinwendu V, NP       hydrOXYzine (ATARAX) tablet  25 mg  25 mg Oral TID PRN Bing Neighbors, NP   25 mg at 02/07/23 1191   LORazepam (ATIVAN) tablet 2 mg  2 mg Oral Q6H PRN Ovide Dusek, Jackquline Denmark, MD   2 mg at 02/05/23 1559   magnesium citrate solution 1 Bottle  1 Bottle Oral Daily PRN Lakin Rhine, Jackquline Denmark, MD   1 Bottle at 02/06/23 1409   magnesium hydroxide (MILK OF MAGNESIA) suspension 30 mL  30 mL Oral Daily PRN Onuoha, Chinwendu V, NP   30 mL at 02/05/23 1556   multivitamin with minerals tablet 1 tablet  1 tablet Oral Daily Enez Monahan, Jackquline Denmark, MD   1 tablet at 02/07/23 0827   nicotine (NICODERM CQ - dosed in mg/24 hours) patch 21 mg  21 mg Transdermal Daily Aiman Noe, Jackquline Denmark, MD   21 mg at 02/07/23 0836   nicotine polacrilex (NICORETTE) gum 2 mg  2 mg Oral PRN Jaklyn Alen T, MD   2 mg at 01/31/23 1408   PARoxetine (PAXIL) tablet 10 mg  10 mg Oral Daily Malik, Kashif   10 mg at 02/07/23 0830   psyllium (HYDROCIL/METAMUCIL) 1 packet  1 packet Oral Daily Valrie Jia, Jackquline Denmark, MD   1 packet at 02/07/23 0828   risperiDONE (RISPERDAL) tablet 2 mg  2 mg Oral QHS Mickell Birdwell T, MD   2 mg at 02/06/23 2124   traMADol (ULTRAM) tablet 50 mg  50 mg Oral Q6H PRN Ellicia Alix, Jackquline Denmark, MD   50 mg at 02/07/23 0828   traZODone (DESYREL) tablet 50 mg  50 mg Oral QHS PRN Bing Neighbors, NP   50 mg at 02/06/23 2124    Lab Results: No results found for this or any previous visit (from the past 48 hour(s)).  Blood Alcohol level:  Lab Results  Component Value Date   ETH <10 01/30/2023   ETH  03/19/2010    <5        LOWEST DETECTABLE LIMIT FOR SERUM ALCOHOL IS 5 mg/dL FOR MEDICAL PURPOSES ONLY    Metabolic Disorder Labs: Lab Results  Component Value Date   HGBA1C 6.1 (H) 01/30/2023   MPG 128.37 01/30/2023   MPG 108 03/19/2010   Lab Results  Component Value Date   PROLACTIN 8.1 01/30/2023   Lab Results  Component Value Date   CHOL 165 01/30/2023   TRIG 58 01/30/2023   HDL 62 01/30/2023   CHOLHDL 2.7 01/30/2023   VLDL 12 01/30/2023   LDLCALC 91 01/30/2023  LDLCALC  03/20/2010    38        Total Cholesterol/HDL:CHD Risk Coronary Heart Disease Risk Table                     Men   Women  1/2 Average Risk   3.4   3.3  Average Risk       5.0   4.4  2 X Average Risk   9.6   7.1  3 X Average Risk  23.4   11.0        Use the calculated Patient Ratio above and the CHD Risk Table to determine the patient's CHD Risk.        ATP III CLASSIFICATION (LDL):  <100     mg/dL   Optimal  604-540  mg/dL   Near or Above                    Optimal  130-159  mg/dL   Borderline  981-191  mg/dL   High  >478     mg/dL   Very High    Physical Findings: AIMS:  , ,  ,  ,    CIWA:    COWS:     Musculoskeletal: Strength & Muscle Tone: within normal limits Gait & Station: normal Patient leans: N/A  Psychiatric Specialty Exam:  Presentation  General Appearance:  Casual  Eye Contact: Fair  Speech: Normal Rate; Slow  Speech Volume: Decreased  Handedness: Right   Mood and Affect  Mood: Depressed  Affect: Appropriate; Congruent   Thought Process  Thought Processes: Coherent  Descriptions of Associations:Intact  Orientation:Full (Time, Place and Person)  Thought Content:Logical  History of Schizophrenia/Schizoaffective disorder:No  Duration of Psychotic Symptoms:-- (unknown- could not be assessed due to pt's emotional state)  Hallucinations:No data recorded Ideas of Reference:None  Suicidal Thoughts:No data recorded Homicidal Thoughts:No data recorded  Sensorium  Memory: Immediate Fair; Remote Fair  Judgment: Fair  Insight: Fair   Art therapist  Concentration: Fair  Attention Span: Fair  Recall: [employer] of Knowledge: Fair  Language: Fair   Psychomotor Activity  Psychomotor Activity:No data recorded  Assets  Assets: Communication Skills; Desire for Improvement   Sleep  Sleep:No data recorded   Physical Exam: Physical Exam Vitals reviewed.  Constitutional:      Appearance:  Normal appearance.  HENT:     Head: Normocephalic and atraumatic.     Mouth/Throat:     Pharynx: Oropharynx is clear.  Eyes:     Pupils: Pupils are equal, round, and reactive to light.  Cardiovascular:     Rate and Rhythm: Normal rate and regular rhythm.  Pulmonary:     Effort: Pulmonary effort is normal.     Breath sounds: Normal breath sounds.  Abdominal:     General: Abdomen is flat.     Palpations: Abdomen is soft.  Musculoskeletal:        General: Normal range of motion.  Skin:    General: Skin is warm and dry.  Neurological:     General: No focal deficit present.     Mental Status: She is alert. Mental status is at baseline.  Psychiatric:        Attention and Perception: Attention normal.        Mood and Affect: Mood normal.        Speech: Speech normal.        Behavior: Behavior normal.        Thought Content:  Thought content normal.        Cognition and Memory: Cognition normal.    Review of Systems  Constitutional: Negative.   HENT: Negative.    Eyes: Negative.   Respiratory: Negative.    Cardiovascular: Negative.   Gastrointestinal: Negative.   Musculoskeletal: Negative.   Skin: Negative.   Neurological: Negative.   Psychiatric/Behavioral: Negative.     Blood pressure (!) 122/112, pulse 89, temperature 98.4 F (36.9 C), temperature source Oral, resp. rate 18, height 5\' 5"  (1.651 m), weight 56.7 kg, last menstrual period 10/13/2011, SpO2 93 %. Body mass index is 20.8 kg/m.   Treatment Plan Summary: Plan stays in her room most of the time because of her physical discomfort.  Psychiatrically stable.  I do notice her blood pressure this morning had an elevated reading I do not know if that is real or not we will continue to follow-up with it.  Pain control for the moment appears to be adequate.  Spoke with her about possibly planning for discharge within the next couple days.  Mordecai Rasmussen, MD 02/07/2023, 10:25 AM

## 2023-02-07 NOTE — Progress Notes (Signed)
I assumed care for Stacy Bolton at about 08:00. She was resting in her bed reading a bible, denied any avh/hi/si at the time of my assessment, reports chronic hip pain,requested and received prn Tramadol-effective on follow up. Pt has ambulated without a wheelchair today thus far, Vital signs WNL on recheck, denied any SOB/N/V/D at this time. Pt has been visible in the day area, attended groups with peers. She reports effective bowel movement yesterday with mag citrate. She is being monitored as ordered.

## 2023-02-07 NOTE — Group Note (Signed)
[facility] LCSW Group Therapy Note   Group Date: 02/07/2023 Start Time: 1330 End Time: 1420   Type of Therapy/Topic:  Group Therapy:  Balance in Life  Participation Level:  Did Not Attend   Description of Group:    This group will address the concept of balance and how it feels and looks when one is unbalanced. Patients will be encouraged to process areas in their lives that are out of balance, and identify reasons for remaining unbalanced. Facilitators will guide patients utilizing problem- solving interventions to address and correct the stressor making their life unbalanced. Understanding and applying boundaries will be explored and addressed for obtaining  and maintaining a balanced life. Patients will be encouraged to explore ways to assertively make their unbalanced needs known to significant others in their lives, using other group members and facilitator for support and feedback.  Therapeutic Goals: Patient will identify two or more emotions or situations they have that consume much of in their lives. Patient will identify signs/triggers that life has become out of balance:  Patient will identify two ways to set boundaries in order to achieve balance in their lives:  Patient will demonstrate ability to communicate their needs through discussion and/or role plays  Summary of Patient Progress: X   Therapeutic Modalities:   Cognitive Behavioral Therapy Solution-Focused Therapy Assertiveness Training   Glenis Smoker, LCSW

## 2023-02-07 NOTE — BHH Counselor (Signed)
CSW contacted the [facility] per pt request. CSW inquired regarding pt's belongings. Staff member there informed CSW that any items left are typically put outside the building when they know that the person is not there. CSW inquired regarding staff members by the names Lupita Leash and/or Trinna Post. CSW was informed that there was someone named Diona and that was no Alex. CSW thanked them for their time. Contact ended without incident.   CSW met with pt briefly to update. She was given update and told CSW he should have tried Diona. CSW informed her that they would attempt contact again. She agreed. Pt stated that she now has a place to go upon discharge and that she would like to be discharged now. CSW explained that psychiatrist is the only one that can discharge pt and she would have to speak with him about that. She agreed. No other concerns expressed. Contact ended without incident.   Attempt at contact with Dionna of the [facility] was made again. Contact unable to be established but voicemail left with contact information for follow through.  Vilma Meckel. Algis Greenhouse, MSW, LCSW, LCAS 02/07/2023 4:42 PM

## 2023-02-08 ENCOUNTER — Other Ambulatory Visit: Payer: Self-pay

## 2023-02-08 MED ORDER — TRAZODONE HCL 50 MG PO TABS
50.0000 mg | ORAL_TABLET | Freq: Every evening | ORAL | 0 refills | Status: DC | PRN
  Filled 2023-02-08: qty 10, 10d supply, fill #0

## 2023-02-08 MED ORDER — NICOTINE POLACRILEX 2 MG MT GUM
2.0000 mg | CHEWING_GUM | OROMUCOSAL | 0 refills | Status: DC | PRN
  Filled 2023-02-08: qty 50, 10d supply, fill #0

## 2023-02-08 MED ORDER — FLUTICASONE FUROATE-VILANTEROL 200-25 MCG/ACT IN AEPB
1.0000 | INHALATION_SPRAY | Freq: Every day | RESPIRATORY_TRACT | 0 refills | Status: DC
  Filled 2023-02-08: qty 10, 10d supply, fill #0

## 2023-02-08 MED ORDER — COMBIVENT 18-103 MCG/ACT IN AERO: 2.0000 | INHALATION_SPRAY | Freq: Four times a day (QID) | RESPIRATORY_TRACT | 1 refills | Status: DC | PRN

## 2023-02-08 MED ORDER — TRAZODONE HCL 50 MG PO TABS: 50.0000 mg | ORAL_TABLET | Freq: Every evening | ORAL | 1 refills | Status: DC | PRN

## 2023-02-08 MED ORDER — HYDROXYZINE HCL 25 MG PO TABS: 25.0000 mg | ORAL_TABLET | Freq: Three times a day (TID) | ORAL | 1 refills | Status: DC | PRN

## 2023-02-08 MED ORDER — FLUTICASONE FUROATE-VILANTEROL 200-25 MCG/ACT IN AEPB: 1.0000 | INHALATION_SPRAY | Freq: Every day | RESPIRATORY_TRACT | 1 refills | Status: DC

## 2023-02-08 MED ORDER — TRAMADOL HCL 50 MG PO TABS: 50.0000 mg | ORAL_TABLET | Freq: Four times a day (QID) | ORAL | 0 refills | Status: DC | PRN

## 2023-02-08 MED ORDER — RISPERIDONE 2 MG PO TABS: 2.0000 mg | ORAL_TABLET | Freq: Every day | ORAL | 1 refills | Status: DC

## 2023-02-08 MED ORDER — DIVALPROEX SODIUM ER 500 MG PO TB24
500.0000 mg | ORAL_TABLET | Freq: Two times a day (BID) | ORAL | 0 refills | Status: DC
  Filled 2023-02-08: qty 20, 10d supply, fill #0

## 2023-02-08 MED ORDER — MAGNESIUM CITRATE PO SOLN: 1.0000 | Freq: Every day | ORAL | 5 refills | Status: DC | PRN

## 2023-02-08 MED ORDER — ALBUTEROL SULFATE HFA 108 (90 BASE) MCG/ACT IN AERS: 2.0000 | INHALATION_SPRAY | RESPIRATORY_TRACT | 1 refills | Status: DC | PRN

## 2023-02-08 MED ORDER — NICOTINE 21 MG/24HR TD PT24
21.0000 mg | MEDICATED_PATCH | Freq: Every day | TRANSDERMAL | 0 refills | Status: DC
  Filled 2023-02-08: qty 14, 14d supply, fill #0

## 2023-02-08 MED ORDER — HYDROXYZINE HCL 25 MG PO TABS
25.0000 mg | ORAL_TABLET | Freq: Three times a day (TID) | ORAL | 0 refills | Status: DC | PRN
  Filled 2023-02-08: qty 20, 7d supply, fill #0

## 2023-02-08 MED ORDER — DIVALPROEX SODIUM ER 500 MG PO TB24: 500.0000 mg | ORAL_TABLET | Freq: Two times a day (BID) | ORAL | 1 refills | Status: DC

## 2023-02-08 MED ORDER — PAROXETINE HCL 10 MG PO TABS: 10.0000 mg | ORAL_TABLET | Freq: Every day | ORAL | 1 refills | Status: DC

## 2023-02-08 MED ORDER — NICOTINE 21 MG/24HR TD PT24: 21.0000 mg | MEDICATED_PATCH | Freq: Every day | TRANSDERMAL | 1 refills | Status: DC

## 2023-02-08 MED ORDER — PSYLLIUM 95 % PO PACK: 1.0000 | PACK | Freq: Every day | ORAL | 1 refills | Status: DC

## 2023-02-08 MED ORDER — RISPERIDONE 2 MG PO TABS
2.0000 mg | ORAL_TABLET | Freq: Every day | ORAL | 0 refills | Status: DC
  Filled 2023-02-08: qty 10, 10d supply, fill #0

## 2023-02-08 MED ORDER — NICOTINE POLACRILEX 2 MG MT GUM: 2.0000 mg | CHEWING_GUM | OROMUCOSAL | 1 refills | Status: DC | PRN

## 2023-02-08 MED ORDER — PSYLLIUM 95 % PO PACK
1.0000 | PACK | Freq: Every day | ORAL | 0 refills | Status: DC
  Filled 2023-02-08: qty 14, 14d supply, fill #0

## 2023-02-08 MED ORDER — ALBUTEROL SULFATE HFA 108 (90 BASE) MCG/ACT IN AERS
2.0000 | INHALATION_SPRAY | RESPIRATORY_TRACT | 0 refills | Status: DC | PRN
  Filled 2023-02-08: qty 1, fill #0

## 2023-02-08 MED ORDER — PAROXETINE HCL 10 MG PO TABS
10.0000 mg | ORAL_TABLET | Freq: Every day | ORAL | 0 refills | Status: DC
  Filled 2023-02-08: qty 10, 10d supply, fill #0

## 2023-02-08 MED ORDER — MAGNESIUM CITRATE PO SOLN
1.0000 | Freq: Every day | ORAL | 0 refills | Status: DC | PRN
  Filled 2023-02-08: qty 296, 1d supply, fill #0

## 2023-02-08 NOTE — Group Note (Signed)
Date:  02/08/2023 Time:  11:24 AM  Group Topic/Focus:  Activity Group    Participation Level:  Did Not Attend   Stacy Bolton 02/08/2023, 11:24 AM

## 2023-02-08 NOTE — BHH Counselor (Addendum)
CSW rescinded email to [facility], they no longer provide ACT services Penni Homans, MSW, LCSW 02/08/2023 4:36 PM    CSW completed referrals for ACT with [facility], [facility] and [facility].  All faxes and emails were successful.    Contact information was included on patients aftercare information.  Penni Homans, MSW, LCSW 02/08/2023 4:25 PM

## 2023-02-08 NOTE — BHH Suicide Risk Assessment (Signed)
[facility] Discharge Suicide Risk Assessment   Principal Problem: Bipolar I disorder, most recent episode (or current) manic (HCC) Discharge Diagnoses: Principal Problem:   Bipolar I disorder, most recent episode (or current) manic (HCC) Active Problems:   COPD (chronic obstructive pulmonary disease) (HCC)   Cocaine abuse (HCC)   Total Time spent with patient: 30 minutes  Musculoskeletal: Strength & Muscle Tone: within normal limits Gait & Station: normal Patient leans: N/A  Psychiatric Specialty Exam  Presentation  General Appearance:  Casual  Eye Contact: Fair  Speech: Normal Rate; Slow  Speech Volume: Decreased  Handedness: Right   Mood and Affect  Mood: Depressed  Duration of Depression Symptoms: Greater than two weeks  Affect: Appropriate; Congruent   Thought Process  Thought Processes: Coherent  Descriptions of Associations:Intact  Orientation:Full (Time, Place and Person)  Thought Content:Logical  History of Schizophrenia/Schizoaffective disorder:No  Duration of Psychotic Symptoms:-- (unknown- could not be assessed due to pt's emotional state)  Hallucinations:No data recorded Ideas of Reference:None  Suicidal Thoughts:No data recorded Homicidal Thoughts:No data recorded  Sensorium  Memory: Immediate Fair; Remote Fair  Judgment: Fair  Insight: Fair   Art therapist  Concentration: Fair  Attention Span: Fair  Recall: [employer] of Knowledge: Fair  Language: Fair   Psychomotor Activity  Psychomotor Activity:No data recorded  Assets  Assets: Communication Skills; Desire for Improvement   Sleep  Sleep:No data recorded  Physical Exam: Physical Exam Vitals and nursing note reviewed.  Constitutional:      Appearance: Normal appearance.  HENT:     Head: Normocephalic and atraumatic.     Mouth/Throat:     Pharynx: Oropharynx is clear.  Eyes:     Pupils: Pupils are equal, round, and reactive to light.   Cardiovascular:     Rate and Rhythm: Normal rate and regular rhythm.  Pulmonary:     Effort: Pulmonary effort is normal.     Breath sounds: Normal breath sounds.  Abdominal:     General: Abdomen is flat.     Palpations: Abdomen is soft.  Musculoskeletal:        General: Normal range of motion.  Skin:    General: Skin is warm and dry.  Neurological:     General: No focal deficit present.     Mental Status: She is alert. Mental status is at baseline.  Psychiatric:        Attention and Perception: Attention normal.        Mood and Affect: Mood normal.        Speech: Speech normal.        Behavior: Behavior normal.        Thought Content: Thought content normal.        Cognition and Memory: Cognition normal.        Judgment: Judgment normal.    Review of Systems  Constitutional: Negative.   HENT: Negative.    Eyes: Negative.   Respiratory: Negative.    Cardiovascular: Negative.   Gastrointestinal: Negative.   Musculoskeletal: Negative.   Skin: Negative.   Neurological: Negative.   Psychiatric/Behavioral: Negative.     Blood pressure 99/70, pulse (!) 114, temperature 98.5 F (36.9 C), temperature source Oral, resp. rate 19, height 5\' 5"  (1.651 m), weight 56.7 kg, last menstrual period 10/13/2011, SpO2 95 %. Body mass index is 20.8 kg/m.  Mental Status Per Nursing Assessment::   On Admission:  NA  Demographic Factors:  Caucasian, Low socioeconomic status, and Living alone  Loss Factors: Decline in physical  health  Historical Factors: Impulsivity  Risk Reduction Factors:   Positive coping skills or problem solving skills  Continued Clinical Symptoms:  Bipolar Disorder:   Mixed State Depression:   Impulsivity Medical Diagnoses and Treatments/Surgeries  Cognitive Features That Contribute To Risk:  None    Suicide Risk:  Minimal: No identifiable suicidal ideation.  Patients presenting with no risk factors but with morbid ruminations; may be classified as  minimal risk based on the severity of the depressive symptoms    Plan Of Care/Follow-up recommendations:  Other:  Patient is being given prescriptions and supplies of medicine and referred for outpatient treatment.  Patient denies any suicidal thought whatsoever.  No suicidal ideation and no behavior problems.  Lucid and calm no psychosis.  Agrees to outpatient follow-up in the community both medically and psychiatrically.  Mordecai Rasmussen, MD 02/08/2023, 10:38 AM

## 2023-02-08 NOTE — Discharge Summary (Signed)
Physician Discharge Summary Note  Patient:  Stacy Bolton is an 52 y.o., female MRN:  [id number] DOB:  May 09, 1971 Patient phone:  [phone] (home)  Patient address:   [address] [place] [address],  Total Time spent with patient: 30 minutes  Date of Admission:  01/31/2023 Date of Discharge: 02/08/2023  Reason for Admission: Patient was admitted after presenting with disorganized thinking grandiosity and mood lability symptoms characteristic of mania.  Principal Problem: Bipolar I disorder, most recent episode (or current) manic (HCC) Discharge Diagnoses: Principal Problem:   Bipolar I disorder, most recent episode (or current) manic (HCC) Active Problems:   COPD (chronic obstructive pulmonary disease) (HCC)   Cocaine abuse (HCC)   Past Psychiatric History: Patient has a long history of substance abuse problems mood symptoms bipolar and depression  Past Medical History:  Past Medical History:  Diagnosis Date   Anxiety    Asthma    COPD (chronic obstructive pulmonary disease) (HCC)    Dyspnea    Endometriosis    Seizure (HCC)     Past Surgical History:  Procedure Laterality Date   CHOLECYSTECTOMY     Family History: History reviewed. No pertinent family history. Family Psychiatric  History: See previous Social History:  Social History   Substance and Sexual Activity  Alcohol Use Not Currently     Social History   Substance and Sexual Activity  Drug Use Yes   Types: Cocaine, Marijuana   Comment: EVERY THREE DAYS    Social History   Socioeconomic History   Marital status: Single    Spouse name: Not on file   Number of children: Not on file   Years of education: Not on file   Highest education level: Not on file  Occupational History   Not on file  Tobacco Use   Smoking status: Every Day    Packs/day: 1.50    Years: 30.00    Additional pack years: 0.00    Total pack years: 45.00    Types: Cigarettes   Smokeless tobacco: Not on  file  Vaping Use   Vaping Use: Never used  Substance and Sexual Activity   Alcohol use: Not Currently   Drug use: Yes    Types: Cocaine, Marijuana    Comment: EVERY THREE DAYS   Sexual activity: Not Currently  Other Topics Concern   Not on file  Social History Narrative   Not on file   Social Determinants of Health   Financial Resource Strain: Not on file  Food Insecurity: No Food Insecurity (01/31/2023)   Hunger Vital Sign    Worried About Running Out of Food in the Last Year: Never true    Ran Out of Food in the Last Year: Never true  Transportation Needs: No Transportation Needs (01/31/2023)   PRAPARE - Administrator, Civil Service (Medical): No    Lack of Transportation (Non-Medical): No  Physical Activity: Not on file  Stress: Not on file  Social Connections: Not on file    Hospital Course: Patient admitted to psychiatric hospital.  Early presentation psychiatrically was very typical of mania with grandiosity hyper religious speech rapid speech disorganized thinking.  Patient was started on Depakote and antipsychotic medicine.  Symptoms improved significantly over the next few days.  Patient did not display any dangerous or violent behavior on the unit.  Mentally she got much better.  Hospitalization was extended somewhat because of serious physical problems.  Patient has a large rectal prolapse causing a great  deal of discomfort which she has been seeking treatment for for about a month.  She complained of this quite a bit on the ward.  We made efforts to try and get this problem treated during this hospitalization.  Consultations were obtained with surgery and with OB/GYN and GI.  The conclusion was that the patient needed to see a colorectal surgeon of which the closest one is in [place].  I spoke with the [facility] colorectal surgeon's who felt that the best course of action was an outpatient appointment given the description in the diagnosis.  An appointment has  now been made and confirmed and that information is being given to the patient at discharge.  She has an appointment at [facility] surgery at 8:15 in the morning on July 8.  Patient has no other living resources and is going to be discharged today back to the [facility] shelter in [place].  Medication supply and prescriptions given.  Psychoeducation given.  Patient not currently showing signs of psychosis.  He denies suicidal thought.  Physical Findings: AIMS:  , ,  ,  ,    CIWA:    COWS:     Musculoskeletal: Strength & Muscle Tone: within normal limits Gait & Station: normal Patient leans: N/A   Psychiatric Specialty Exam:  Presentation  General Appearance:  Casual  Eye Contact: Fair  Speech: Normal Rate; Slow  Speech Volume: Decreased  Handedness: Right   Mood and Affect  Mood: Depressed  Affect: Appropriate; Congruent   Thought Process  Thought Processes: Coherent  Descriptions of Associations:Intact  Orientation:Full (Time, Place and Person)  Thought Content:Logical  History of Schizophrenia/Schizoaffective disorder:No  Duration of Psychotic Symptoms:-- (unknown- could not be assessed due to pt's emotional state)  Hallucinations:No data recorded Ideas of Reference:None  Suicidal Thoughts:No data recorded Homicidal Thoughts:No data recorded  Sensorium  Memory: Immediate Fair; Remote Fair  Judgment: Fair  Insight: Fair   Art therapist  Concentration: Fair  Attention Span: Fair  Recall: [employer] of Knowledge: Fair  Language: Fair   Psychomotor Activity  Psychomotor Activity:No data recorded  Assets  Assets: Communication Skills; Desire for Improvement   Sleep  Sleep:No data recorded   Physical Exam: Physical Exam Vitals and nursing note reviewed.  Constitutional:      Appearance: Normal appearance.  HENT:     Head: Normocephalic and atraumatic.     Mouth/Throat:     Pharynx: Oropharynx is clear.   Eyes:     Pupils: Pupils are equal, round, and reactive to light.  Cardiovascular:     Rate and Rhythm: Normal rate and regular rhythm.  Pulmonary:     Effort: Pulmonary effort is normal.     Breath sounds: Normal breath sounds.  Abdominal:     General: Abdomen is flat.     Palpations: Abdomen is soft.  Musculoskeletal:        General: Normal range of motion.  Skin:    General: Skin is warm and dry.  Neurological:     General: No focal deficit present.     Mental Status: She is alert. Mental status is at baseline.  Psychiatric:        Attention and Perception: Attention normal.        Mood and Affect: Mood normal.        Speech: Speech normal.        Behavior: Behavior normal.        Thought Content: Thought content normal.  Cognition and Memory: Cognition normal.        Judgment: Judgment normal.    Review of Systems  Constitutional: Negative.   HENT: Negative.    Eyes: Negative.   Respiratory: Negative.    Cardiovascular: Negative.   Gastrointestinal: Negative.   Musculoskeletal: Negative.   Skin: Negative.   Neurological: Negative.   Psychiatric/Behavioral: Negative.     Blood pressure 99/70, pulse (!) 114, temperature 98.5 F (36.9 C), temperature source Oral, resp. rate 19, height 5\' 5"  (1.651 m), weight 56.7 kg, last menstrual period 10/13/2011, SpO2 95 %. Body mass index is 20.8 kg/m.   Social History   Tobacco Use  Smoking Status Every Day   Packs/day: 1.50   Years: 30.00   Additional pack years: 0.00   Total pack years: 45.00   Types: Cigarettes  Smokeless Tobacco Not on file   Tobacco Cessation:  A prescription for an FDA-approved tobacco cessation medication provided at discharge   Blood Alcohol level:  Lab Results  Component Value Date   ETH <10 01/30/2023   [facility]  03/19/2010    <5        LOWEST DETECTABLE LIMIT FOR SERUM ALCOHOL IS 5 mg/dL FOR MEDICAL PURPOSES ONLY    Metabolic Disorder Labs:  Lab Results  Component Value Date    HGBA1C 6.1 (H) 01/30/2023   MPG 128.37 01/30/2023   MPG 108 03/19/2010   Lab Results  Component Value Date   PROLACTIN 8.1 01/30/2023   Lab Results  Component Value Date   CHOL 165 01/30/2023   TRIG 58 01/30/2023   HDL 62 01/30/2023   CHOLHDL 2.7 01/30/2023   VLDL 12 01/30/2023   LDLCALC 91 01/30/2023   LDLCALC  03/20/2010    38        Total Cholesterol/HDL:CHD Risk Coronary Heart Disease Risk Table                     Men   Women  1/2 Average Risk   3.4   3.3  Average Risk       5.0   4.4  2 X Average Risk   9.6   7.1  3 X Average Risk  23.4   11.0        Use the calculated Patient Ratio above and the CHD Risk Table to determine the patient's CHD Risk.        ATP III CLASSIFICATION (LDL):  <100     mg/dL   Optimal  960-454  mg/dL   Near or Above                    Optimal  130-159  mg/dL   Borderline  098-119  mg/dL   High  >147     mg/dL   Very High    See Psychiatric Specialty Exam and Suicide Risk Assessment completed by Attending Physician prior to discharge.  Discharge destination:  Home  Is patient on multiple antipsychotic therapies at discharge:  No   Has Patient had three or more failed trials of antipsychotic monotherapy by history:  No  Recommended Plan for Multiple Antipsychotic Therapies: NA  Discharge Instructions     Diet - low sodium heart healthy   Complete by: As directed    Increase activity slowly   Complete by: As directed       Allergies as of 02/08/2023       Reactions   Hydrocodone-acetaminophen    REACTION: itch   Hydromorphone Hcl  REACTION: itching   Oxycodone-acetaminophen Itching        Medication List     STOP taking these medications    Advair Diskus 250-50 MCG/DOSE Aepb Generic drug: Fluticasone-Salmeterol Replaced by: fluticasone furoate-vilanterol 200-25 MCG/ACT Aepb   budesonide 0.5 MG/2ML nebulizer solution Commonly known as: PULMICORT   gabapentin 100 MG capsule Commonly known as: NEURONTIN    pantoprazole 40 MG tablet Commonly known as: PROTONIX       TAKE these medications      Indication  albuterol 108 (90 Base) MCG/ACT inhaler Commonly known as: Proventil HFA Inhale 2 puffs into the lungs every 4 (four) hours as needed for wheezing or shortness of breath.  Indication: Chronic Obstructive Lung Disease   Combivent 18-103 MCG/ACT inhaler Generic drug: albuterol-ipratropium Inhale 2 puffs into the lungs every 6 (six) hours as needed. For shortness of breath  Indication: COPD   divalproex 500 MG 24 hr tablet Commonly known as: DEPAKOTE ER Take 1 tablet (500 mg total) by mouth 2 (two) times daily. What changed: when to take this  Indication: Depressive Phase of Manic-Depression, MIXED BIPOLAR AFFECTIVE DISORDER   fluticasone furoate-vilanterol 200-25 MCG/ACT Aepb Commonly known as: BREO ELLIPTA Inhale 1 puff into the lungs daily. Start taking on: February 09, 2023 Replaces: Advair Diskus 250-50 MCG/DOSE Aepb  Indication: Asthma   hydrOXYzine 25 MG tablet Commonly known as: ATARAX Take 1 tablet (25 mg total) by mouth 3 (three) times daily as needed for anxiety.  Indication: Feeling Anxious   magnesium citrate Soln Take 296 mLs (1 Bottle total) by mouth daily as needed for severe constipation.  Indication: Constipation   nicotine polacrilex 2 MG gum Commonly known as: NICORETTE Take 1 each (2 mg total) by mouth as needed for smoking cessation.  Indication: Nicotine Addiction   Nicotine Step 1 21 mg/24hr patch Generic drug: nicotine Place 1 patch (21 mg total) onto the skin daily. Start taking on: February 09, 2023  Indication: Nicotine Addiction   PARoxetine 10 MG tablet Commonly known as: PAXIL Take 1 tablet (10 mg total) by mouth daily. Start taking on: February 09, 2023  Indication: Major Depressive Disorder, Panic Disorder   psyllium 95 % Pack Commonly known as: HYDROCIL/METAMUCIL Take 1 packet by mouth daily. Start taking on: February 09, 2023  Indication:  Constipation   risperiDONE 2 MG tablet Commonly known as: RISPERDAL Take 1 tablet (2 mg total) by mouth at bedtime. What changed:  medication strength how much to take  Indication: Major Depressive Disorder   traMADol 50 MG tablet Commonly known as: ULTRAM Take 1 tablet (50 mg total) by mouth every 6 (six) hours as needed for moderate pain.  Indication: Pain   traZODone 50 MG tablet Commonly known as: DESYREL Take 1 tablet (50 mg total) by mouth at bedtime as needed for sleep.  Indication: Trouble Sleeping        Follow-up Information     [facility], [place]. Go to.   Specialty: General Surgery Why: Your appointment is scheduled for Monday, 03/04/23 at 8:15AM with Dr. Karie Soda. Thanks! Contact information: [address] [place] [address] [phone]                 Follow-up recommendations:  Other:  Follow-up with outpatient care through local agencies in [place].  Follow up with surgery clinic as indicated for medical problems.  Prescriptions of medicines provided as well as a supply of medicine.  Comments: See above  Signed: Mordecai Rasmussen,  MD 02/08/2023, 2:42 PM

## 2023-02-08 NOTE — Progress Notes (Signed)
  [facility] Adult Case Management Discharge Plan :  Will you be returning to the same living situation after discharge:  No. At discharge, do you have transportation home?: Yes,  CSW arranged transportation to [facility], per pt request. Do you have the ability to pay for your medications: No.  Release of information consent forms completed and in the chart;  Patient's signature needed at discharge.  Patient to Follow up at:  Follow-up Information     Surgery, [place]. Go to.   Specialty: General Surgery Why: Your appointment is scheduled for Monday, 03/04/23 at 8:15AM with Dr. Karie Soda. Thanks! Contact information: [address] [place] [address] [phone]         [facility] Follow up.   Specialty: Behavioral Health Why: They have walk-in hours Monday, Tuesday, Wednesday from 8AM-1PM. They recommended that you arrive by 7:30AM because clients are seen on a first come, first served basis. Please go in and sign in with the front desk to reserve your spot. They also have walk-in hours on Friday from 1pm-4pm. Thanks! Contact information: [address] [place] [address] [phone]        [facility] Follow up.   Why: Referral for ACTT services have been submitted. Contact information: [address] [place] [address] [phone]         [facility] Follow up.   Why: Referral for ACTT services have been submitted. Contact information: [address] [place] [address] [phone]         [facility] Follow up.   Why: Referral for ACTT services have been submitted. Contact information: [address]  [address] [place] [address] [phone]                 Next level of care provider has access to [facility] Link:no  Safety Planning and Suicide Prevention discussed: Yes,  SPE completed with sister, Vickki Hearing.     Has  patient been referred to the Quitline?: Yes, faxed/e-referral on 02/08/23.  Patient has been referred for addiction treatment: Patient refused referral for treatment.  Glenis Smoker, LCSW 02/08/2023, 4:45 PM

## 2023-02-08 NOTE — Progress Notes (Signed)
Patient pleasant and cooperative on approach. Denies SI,HI and AVH. Verbalized understanding discharge instructions,prescriptions and follow up care. 7 days medicines given to patient. All belongings returned from BM[facility] locker. Suicide safety plan filled by patient and placed in chart. Copy given to patient.Patient escorted out by staff and transported by cab. 

## 2023-04-28 ENCOUNTER — Emergency Department (HOSPITAL_COMMUNITY): Payer: Medicaid Other

## 2023-04-28 ENCOUNTER — Other Ambulatory Visit: Payer: Self-pay

## 2023-04-28 ENCOUNTER — Inpatient Hospital Stay (HOSPITAL_COMMUNITY)
Admission: EM | Admit: 2023-04-28 | Discharge: 2023-05-03 | DRG: 177 | Disposition: A | Discharge status: Home / Self Care | Payer: Medicaid Other | Attending: Internal Medicine | Admitting: Internal Medicine

## 2023-04-28 ENCOUNTER — Encounter (HOSPITAL_COMMUNITY): Payer: Self-pay

## 2023-04-28 DIAGNOSIS — Z91411 Personal history of adult psychological abuse: Secondary | ICD-10-CM

## 2023-04-28 DIAGNOSIS — E876 Hypokalemia: Secondary | ICD-10-CM | POA: Diagnosis present

## 2023-04-28 DIAGNOSIS — F17210 Nicotine dependence, cigarettes, uncomplicated: Secondary | ICD-10-CM | POA: Diagnosis present

## 2023-04-28 DIAGNOSIS — R0902 Hypoxemia: Secondary | ICD-10-CM | POA: Diagnosis present

## 2023-04-28 DIAGNOSIS — Z91199 Patient's noncompliance with other medical treatment and regimen due to unspecified reason: Secondary | ICD-10-CM

## 2023-04-28 DIAGNOSIS — F411 Generalized anxiety disorder: Secondary | ICD-10-CM | POA: Diagnosis present

## 2023-04-28 DIAGNOSIS — J9602 Acute respiratory failure with hypercapnia: Secondary | ICD-10-CM | POA: Diagnosis present

## 2023-04-28 DIAGNOSIS — Z888 Allergy status to other drugs, medicaments and biological substances status: Secondary | ICD-10-CM | POA: Diagnosis not present

## 2023-04-28 DIAGNOSIS — Z885 Allergy status to narcotic agent status: Secondary | ICD-10-CM | POA: Diagnosis not present

## 2023-04-28 DIAGNOSIS — D751 Secondary polycythemia: Secondary | ICD-10-CM | POA: Diagnosis present

## 2023-04-28 DIAGNOSIS — R451 Restlessness and agitation: Secondary | ICD-10-CM | POA: Diagnosis present

## 2023-04-28 DIAGNOSIS — Z79899 Other long term (current) drug therapy: Secondary | ICD-10-CM

## 2023-04-28 DIAGNOSIS — F1210 Cannabis abuse, uncomplicated: Secondary | ICD-10-CM | POA: Diagnosis present

## 2023-04-28 DIAGNOSIS — E43 Unspecified severe protein-calorie malnutrition: Secondary | ICD-10-CM | POA: Diagnosis present

## 2023-04-28 DIAGNOSIS — Z7951 Long term (current) use of inhaled steroids: Secondary | ICD-10-CM

## 2023-04-28 DIAGNOSIS — Z5986 Financial insecurity: Secondary | ICD-10-CM

## 2023-04-28 DIAGNOSIS — F14159 Cocaine abuse with cocaine-induced psychotic disorder, unspecified: Secondary | ICD-10-CM | POA: Diagnosis present

## 2023-04-28 DIAGNOSIS — J441 Chronic obstructive pulmonary disease with (acute) exacerbation: Secondary | ICD-10-CM | POA: Diagnosis present

## 2023-04-28 DIAGNOSIS — G40909 Epilepsy, unspecified, not intractable, without status epilepticus: Secondary | ICD-10-CM | POA: Diagnosis present

## 2023-04-28 DIAGNOSIS — Z5941 Food insecurity: Secondary | ICD-10-CM

## 2023-04-28 DIAGNOSIS — Z681 Body mass index (BMI) 19 or less, adult: Secondary | ICD-10-CM | POA: Diagnosis not present

## 2023-04-28 DIAGNOSIS — Z7151 Drug abuse counseling and surveillance of drug abuser: Secondary | ICD-10-CM

## 2023-04-28 DIAGNOSIS — F1413 Cocaine abuse, unspecified with withdrawal: Secondary | ICD-10-CM | POA: Diagnosis not present

## 2023-04-28 DIAGNOSIS — U071 COVID-19: Principal | ICD-10-CM | POA: Diagnosis present

## 2023-04-28 DIAGNOSIS — Z9049 Acquired absence of other specified parts of digestive tract: Secondary | ICD-10-CM

## 2023-04-28 DIAGNOSIS — Z91410 Personal history of adult physical and sexual abuse: Secondary | ICD-10-CM

## 2023-04-28 DIAGNOSIS — J9601 Acute respiratory failure with hypoxia: Secondary | ICD-10-CM | POA: Diagnosis present

## 2023-04-28 DIAGNOSIS — J449 Chronic obstructive pulmonary disease, unspecified: Secondary | ICD-10-CM | POA: Diagnosis present

## 2023-04-28 DIAGNOSIS — E8809 Other disorders of plasma-protein metabolism, not elsewhere classified: Secondary | ICD-10-CM | POA: Diagnosis present

## 2023-04-28 DIAGNOSIS — Z87898 Personal history of other specified conditions: Secondary | ICD-10-CM

## 2023-04-28 DIAGNOSIS — Z5982 Transportation insecurity: Secondary | ICD-10-CM

## 2023-04-28 DIAGNOSIS — F319 Bipolar disorder, unspecified: Secondary | ICD-10-CM | POA: Diagnosis present

## 2023-04-28 DIAGNOSIS — Z5900 Homelessness unspecified: Secondary | ICD-10-CM

## 2023-04-28 LAB — C-REACTIVE PROTEIN: CRP: 0.6 mg/dL (ref <1.0)

## 2023-04-28 LAB — CBC
HCT: 50.6 % — ABNORMAL HIGH (ref 36.0–46.0)
Hemoglobin: 16.6 g/dL — ABNORMAL HIGH (ref 12.0–15.0)
MCH: 32.2 pg (ref 26.0–34.0)
MCHC: 32.8 g/dL (ref 30.0–36.0)
MCV: 98.1 fL (ref 80.0–100.0)
Platelets: 262 10*3/uL (ref 150–400)
RBC: 5.16 MIL/uL — ABNORMAL HIGH (ref 3.87–5.11)
RDW: 13.6 % (ref 11.5–15.5)
WBC: 10.7 10*3/uL — ABNORMAL HIGH (ref 4.0–10.5)
nRBC: 0 % (ref 0.0–0.2)

## 2023-04-28 LAB — COMPREHENSIVE METABOLIC PANEL
ALT: 14 U/L (ref 0–44)
AST: 18 U/L (ref 15–41)
Albumin: 2.9 g/dL — ABNORMAL LOW (ref 3.5–5.0)
Alkaline Phosphatase: 68 U/L (ref 38–126)
Anion gap: 7 (ref 5–15)
BUN: 7 mg/dL (ref 6–20)
CO2: 25 mmol/L (ref 22–32)
Calcium: 6.8 mg/dL — ABNORMAL LOW (ref 8.9–10.3)
Chloride: 111 mmol/L (ref 98–111)
Creatinine, Ser: 0.56 mg/dL (ref 0.44–1.00)
GFR, Estimated: >60 mL/min (ref >60)
Glucose, Bld: 112 mg/dL — ABNORMAL HIGH (ref 70–99)
Potassium: 2.5 mmol/L — CL (ref 3.5–5.1)
Sodium: 143 mmol/L (ref 135–145)
Total Bilirubin: 0.2 mg/dL — ABNORMAL LOW (ref 0.3–1.2)
Total Protein: 5.6 g/dL — ABNORMAL LOW (ref 6.5–8.1)

## 2023-04-28 LAB — PROCALCITONIN: Procalcitonin: <0.1 ng/mL

## 2023-04-28 LAB — BLOOD GAS, VENOUS
Acid-Base Excess: 8.1 mmol/L — ABNORMAL HIGH (ref 0.0–2.0)
Bicarbonate: 36.1 mmol/L — ABNORMAL HIGH (ref 20.0–28.0)
O2 Saturation: 41.6 %
Patient temperature: 37
pCO2, Ven: 61 mmHg — ABNORMAL HIGH (ref 44–60)
pH, Ven: 7.38 (ref 7.25–7.43)
pO2, Ven: <31 mmHg — CL (ref 32–45)

## 2023-04-28 LAB — RESP PANEL BY RT-PCR (RSV, FLU A&B, COVID)  RVPGX2
Influenza A by PCR: NEGATIVE
Influenza B by PCR: NEGATIVE
Resp Syncytial Virus by PCR: NEGATIVE
SARS Coronavirus 2 by RT PCR: POSITIVE — AB

## 2023-04-28 MED ORDER — ENSURE ENLIVE PO LIQD
237.0000 mL | Freq: Two times a day (BID) | ORAL | Status: DC
  Administered 2023-04-29: 237 mL via ORAL

## 2023-04-28 MED ORDER — SODIUM CHLORIDE 0.9 % IV SOLN
100.0000 mg | Freq: Every day | INTRAVENOUS | Status: AC
  Administered 2023-04-29 – 2023-04-30 (×2): 100 mg via INTRAVENOUS
  Filled 2023-04-28 (×3): qty 20

## 2023-04-28 MED ORDER — POTASSIUM CHLORIDE 10 MEQ/100ML IV SOLN
10.0000 meq | INTRAVENOUS | Status: AC
  Administered 2023-04-28 (×3): 10 meq via INTRAVENOUS
  Filled 2023-04-28 (×4): qty 100

## 2023-04-28 MED ORDER — PNEUMOCOCCAL 20-VAL CONJ VACC 0.5 ML IM SUSY
0.5000 mL | PREFILLED_SYRINGE | INTRAMUSCULAR | Status: DC
  Filled 2023-04-28: qty 0.5

## 2023-04-28 MED ORDER — IPRATROPIUM-ALBUTEROL 0.5-2.5 (3) MG/3ML IN SOLN
3.0000 mL | Freq: Once | RESPIRATORY_TRACT | Status: AC
  Administered 2023-04-28: 3 mL via RESPIRATORY_TRACT

## 2023-04-28 MED ORDER — ACETAMINOPHEN 325 MG PO TABS
650.0000 mg | ORAL_TABLET | Freq: Four times a day (QID) | ORAL | Status: DC | PRN
  Administered 2023-04-28 – 2023-05-03 (×4): 650 mg via ORAL
  Filled 2023-04-28 (×4): qty 2

## 2023-04-28 MED ORDER — SALINE SPRAY 0.65 % NA SOLN
1.0000 | NASAL | Status: DC | PRN
  Filled 2023-04-28: qty 44

## 2023-04-28 MED ORDER — ENOXAPARIN SODIUM 40 MG/0.4ML IJ SOSY
40.0000 mg | PREFILLED_SYRINGE | INTRAMUSCULAR | Status: DC
  Administered 2023-04-28 – 2023-05-03 (×6): 40 mg via SUBCUTANEOUS
  Filled 2023-04-28 (×6): qty 0.4

## 2023-04-28 MED ORDER — ALBUTEROL SULFATE (2.5 MG/3ML) 0.083% IN NEBU: 2.5000 mg | INHALATION_SOLUTION | RESPIRATORY_TRACT | Status: DC | PRN

## 2023-04-28 MED ORDER — IPRATROPIUM-ALBUTEROL 0.5-2.5 (3) MG/3ML IN SOLN
3.0000 mL | Freq: Three times a day (TID) | RESPIRATORY_TRACT | Status: DC
  Filled 2023-04-28: qty 3

## 2023-04-28 MED ORDER — ORAL CARE MOUTH RINSE
15.0000 mL | OROMUCOSAL | Status: DC
  Administered 2023-04-28 – 2023-05-03 (×17): 15 mL via OROMUCOSAL

## 2023-04-28 MED ORDER — ACETAMINOPHEN 650 MG RE SUPP: 650.0000 mg | Freq: Four times a day (QID) | RECTAL | Status: DC | PRN

## 2023-04-28 MED ORDER — PREDNISONE 20 MG PO TABS
40.0000 mg | ORAL_TABLET | Freq: Every day | ORAL | Status: DC
  Administered 2023-04-30: 40 mg via ORAL
  Filled 2023-04-28: qty 2

## 2023-04-28 MED ORDER — ONDANSETRON HCL 4 MG PO TABS: 4.0000 mg | ORAL_TABLET | Freq: Four times a day (QID) | ORAL | Status: DC | PRN

## 2023-04-28 MED ORDER — LORAZEPAM 2 MG/ML IJ SOLN
0.5000 mg | Freq: Once | INTRAMUSCULAR | Status: AC
  Administered 2023-04-28: 0.5 mg via INTRAVENOUS
  Filled 2023-04-28: qty 1

## 2023-04-28 MED ORDER — ONDANSETRON HCL 4 MG/2ML IJ SOLN: 4.0000 mg | Freq: Four times a day (QID) | INTRAMUSCULAR | Status: DC | PRN

## 2023-04-28 MED ORDER — MAGNESIUM SULFATE 2 GM/50ML IV SOLN: 2.0000 g | Freq: Once | INTRAVENOUS | Status: DC

## 2023-04-28 MED ORDER — NICOTINE 21 MG/24HR TD PT24
21.0000 mg | MEDICATED_PATCH | Freq: Every day | TRANSDERMAL | Status: DC | PRN
  Filled 2023-04-28: qty 1

## 2023-04-28 MED ORDER — POTASSIUM CHLORIDE CRYS ER 20 MEQ PO TBCR
40.0000 meq | EXTENDED_RELEASE_TABLET | ORAL | Status: AC
  Administered 2023-04-28 (×2): 40 meq via ORAL
  Filled 2023-04-28 (×2): qty 2

## 2023-04-28 MED ORDER — IPRATROPIUM-ALBUTEROL 0.5-2.5 (3) MG/3ML IN SOLN
3.0000 mL | Freq: Four times a day (QID) | RESPIRATORY_TRACT | Status: DC
  Administered 2023-04-28 (×2): 3 mL via RESPIRATORY_TRACT
  Filled 2023-04-28 (×2): qty 3

## 2023-04-28 MED ORDER — METHYLPREDNISOLONE SODIUM SUCC 40 MG IJ SOLR
40.0000 mg | Freq: Two times a day (BID) | INTRAMUSCULAR | Status: AC
  Administered 2023-04-28 – 2023-04-29 (×2): 40 mg via INTRAVENOUS
  Filled 2023-04-28 (×2): qty 1

## 2023-04-28 MED ORDER — SODIUM CHLORIDE 0.9 % IV SOLN
500.0000 mg | INTRAVENOUS | Status: DC
  Administered 2023-04-28 – 2023-04-29 (×2): 500 mg via INTRAVENOUS
  Filled 2023-04-28 (×2): qty 5

## 2023-04-28 MED ORDER — SODIUM CHLORIDE 0.9 % IV SOLN
200.0000 mg | Freq: Once | INTRAVENOUS | Status: AC
  Administered 2023-04-28: 200 mg via INTRAVENOUS
  Filled 2023-04-28: qty 40

## 2023-04-28 MED ORDER — ORAL CARE MOUTH RINSE: 15.0000 mL | OROMUCOSAL | Status: DC | PRN

## 2023-04-28 MED ORDER — IPRATROPIUM-ALBUTEROL 0.5-2.5 (3) MG/3ML IN SOLN
3.0000 mL | Freq: Once | RESPIRATORY_TRACT | Status: AC
  Administered 2023-04-28: 3 mL via RESPIRATORY_TRACT
  Filled 2023-04-28: qty 9

## 2023-04-28 MED ORDER — CHLORHEXIDINE GLUCONATE CLOTH 2 % EX PADS
6.0000 | MEDICATED_PAD | Freq: Every day | CUTANEOUS | Status: DC
  Administered 2023-04-28 – 2023-04-30 (×3): 6 via TOPICAL

## 2023-04-28 NOTE — H&P (Signed)
History and Physical    Patient: Stacy Bolton ZOX:[id number] DOB: [birth year] DOA: 04/28/2023 DOS: the patient was seen and examined on 04/28/2023 PCP: Patient, No Pcp Per  Patient coming from: Home by EMS  Chief Complaint:  Chief Complaint  Patient presents with   Respiratory Distress   HPI: Stacy Bolton is a 52 y.o. female with PMH of COPD/asthma, anxiety, bipolar disorder, seizure disorder and tobacco use disorder brought to ED by EMS due to productive cough, progressive shortness of breath and hypoxemia to 70%.  Patient reports shortness of breath, productive cough with whitish phlegm, runny nose, sore throat, nausea and diarrhea for 2 days.  Symptoms progressively worse and that prompted her to call EMS.  Also reports chest pain with cough.  Denies abdominal pain other than feeling queasy.  Denies fever or chills.  Denies sick contacts.  She denies UTI symptoms or focal neurosymptoms.  Reports compliance with her inhalers.   When EMS arrived, reportedly hypoxic to 70s and she was given IV Solu-Medrol, DuoNebs, epinephrine, magnesium and started on CPAP.   Patient is interested in cardiopulmonary substation and an event of sudden cardiopulmonary arrest.  In ED, slightly tachypneic to 28.  Saturating in upper 90s on BiPAP.  VBG 7.38/61/<31/36.  K2.5.  WBC 10.7.  Hgb 16.6.  HCT 50.6.  EKG sinus tachycardia to 104.  CXR with hyperinflation.  RVP pending.  Received rounds of DuoNeb, IV Ativan and IV KCl.  Hospitalist service called for admission for COPD exacerbation.  Review of Systems: As mentioned in the history of present illness. All other systems reviewed and are negative. Past Medical History:  Diagnosis Date   Anxiety    Asthma    COPD (chronic obstructive pulmonary disease) (HCC)    Dyspnea    Endometriosis    Seizure (HCC)    Past Surgical History:  Procedure Laterality Date   CHOLECYSTECTOMY     Social History:  reports that she has been smoking  cigarettes. She has a 45 pack-year smoking history. She does not have any smokeless tobacco history on file. She reports that she does not currently use alcohol. She reports current drug use. Drugs: Cocaine and Marijuana.  Allergies  Allergen Reactions   Hydrocodone-Acetaminophen     REACTION: itch   Hydromorphone Hcl     REACTION: itching   Oxycodone-Acetaminophen Itching    History reviewed. No pertinent family history.  Prior to Admission medications   Medication Sig Start Date End Date Taking? Authorizing Provider  albuterol (PROVENTIL HFA) 108 (90 Base) MCG/ACT inhaler Inhale 2 puffs into the lungs every 4 (four) hours as needed for wheezing or shortness of breath. 02/08/23   Clapacs, Jackquline Denmark, MD  albuterol-ipratropium (COMBIVENT) 18-103 MCG/ACT inhaler Inhale 2 puffs into the lungs every 6 (six) hours as needed. For shortness of breath 02/08/23   Clapacs, Jackquline Denmark, MD  divalproex (DEPAKOTE ER) 500 MG 24 hr tablet Take 1 tablet (500 mg total) by mouth 2 (two) times daily. 02/08/23   Clapacs, Jackquline Denmark, MD  fluticasone furoate-vilanterol (BREO ELLIPTA) 200-25 MCG/ACT AEPB Inhale 1 puff into the lungs daily. 02/09/23   Clapacs, Jackquline Denmark, MD  hydrOXYzine (ATARAX) 25 MG tablet Take 1 tablet (25 mg total) by mouth 3 (three) times daily as needed for anxiety. 02/08/23   Clapacs, Jackquline Denmark, MD  magnesium citrate SOLN Take 296 mLs (1 Bottle total) by mouth daily as needed for severe constipation. 02/08/23   Clapacs, Jackquline Denmark, MD  nicotine (NICODERM CQ -  DOSED IN MG/24 HOURS) 21 mg/24hr patch Place 1 patch (21 mg total) onto the skin daily. 02/09/23   Clapacs, Jackquline Denmark, MD  nicotine polacrilex (NICORETTE) 2 MG gum Take 1 each (2 mg total) by mouth as needed for smoking cessation. 02/08/23   Clapacs, Jackquline Denmark, MD  PARoxetine (PAXIL) 10 MG tablet Take 1 tablet (10 mg total) by mouth daily. 02/09/23   Clapacs, Jackquline Denmark, MD  psyllium (HYDROCIL/METAMUCIL) 95 % PACK Take 1 packet by mouth daily. 02/09/23   Clapacs, Jackquline Denmark, MD   risperiDONE (RISPERDAL) 2 MG tablet Take 1 tablet (2 mg total) by mouth at bedtime. 02/08/23   Clapacs, Jackquline Denmark, MD  traMADol (ULTRAM) 50 MG tablet Take 1 tablet (50 mg total) by mouth every 6 (six) hours as needed for moderate pain. 02/08/23   Clapacs, Jackquline Denmark, MD  traZODone (DESYREL) 50 MG tablet Take 1 tablet (50 mg total) by mouth at bedtime as needed for sleep. 02/08/23   Clapacs, Jackquline Denmark, MD    Physical Exam: Vitals:   04/28/23 1400 04/28/23 1430 04/28/23 1457 04/28/23 1500  BP: 117/76 107/73  105/70  Pulse: 95 98 88 95  Resp: 14 19 16  (!) 23  Temp:      TempSrc:      SpO2: 100% 96% 98% 98%  Weight:      Height:       GENERAL: No apparent distress.  Nontoxic. HEENT: MMM.  Vision and hearing grossly intact.  NECK: Supple.  No apparent JVD.  RESP:  No IWOB.  Coarse breath sounds bilaterally but on BiPAP. CVS:  RRR. Heart sounds normal.  ABD/GI/GU: BS+. Abd soft, NTND.  MSK/EXT:   No apparent deformity. Moves extremities. No edema.  SKIN: no apparent skin lesion or wound NEURO: Awake but tends to fall asleep frequently.  Oriented appropriately.  No apparent focal neuro deficit. PSYCH: Calm. Normal affect.  Data Reviewed: See HPI  Assessment and Plan: Principal Problem:   Acute respiratory failure with hypoxia and hypercapnia (HCC) Active Problems:   Bipolar disorder (HCC)   Anxiety state   History of seizure   COPD exacerbation (HCC)  Acute respiratory failure with hypoxia and hypercapnia due to COPD exacerbation due to COVID-19 infection: Presents with runny nose, sore throat, productive cough, shortness of breath nausea and diarrhea.  Hypoxic to 70 when EMS arrived.  VBG with hypercapnia but compensated.  COVID-19 PCR positive.  She also smokes cigarette.  Received IV Solu-Medrol, epinephrine, DuoNebs and magnesium from EMS.  Received multiple rounds of DuoNebs in ED -Continue COPD pathway with systemic steroid, scheduled and as needed nebulizers -Check UDS, procalcitonin  and CRP -Contact and airborne precaution -Wean off BiPAP as able -N.p.o. except sips with meds while on BiPAP -Encouraged smoking cessation  Anxiety/bipolar disorder: Seems to be on Depakote, Paxil, risperidone but med rec pending.  Received Ativan in ED. -Continue home meds after med rec  History of seizure -Continue home Depakote -Hold tramadol.  Tobacco use disorder -Encourage cessation -Nicotine patch  Hypokalemia: ED ordered IV KCl 10 mill equivalent x 4 -Will order additional 10 mill equivalent x 4 -Recheck renal panel later today and then daily    Advance Care Planning:   Code Status: Full Code discussed with patient.  Consults: None  Family Communication: None at bedside  Severity of Illness: The appropriate patient status for this patient is INPATIENT. Inpatient status is judged to be reasonable and necessary in order to provide the required intensity of service to  ensure the patient's safety. The patient's presenting symptoms, physical exam findings, and initial radiographic and laboratory data in the context of their chronic comorbidities is felt to place them at high risk for further clinical deterioration. Furthermore, it is not anticipated that the patient will be medically stable for discharge from the hospital within 2 midnights of admission.   * I certify that at the point of admission it is my clinical judgment that the patient will require inpatient hospital care spanning beyond 2 midnights from the point of admission due to high intensity of service, high risk for further deterioration and high frequency of surveillance required.*  Author: Almon Hercules, MD 04/28/2023 3:20 PM  For on call review [email or web address].

## 2023-04-28 NOTE — ED Triage Notes (Signed)
BIB EMS from home. Pt has been feeling bad for 3 days with productive cough and worsening SOB. Upon EMS arriving pt O2 was in the 70s. Pt received 125mg  solumedrol, 0.3IM epi, 2g magnesium, duoneb and atrovent. Pt on CPAP with EMS. Pt still has labored breathing on arrival.

## 2023-04-28 NOTE — ED Provider Notes (Signed)
[facility] AT [facility] Provider Note   CSN: [id number] Arrival date & time: 04/28/23  1246     History  Chief Complaint  Patient presents with   Respiratory Distress    Stacy Bolton is a 52 y.o. female.  52 year old female with past medical history of asthma and COPD presenting to the emergency department today with shortness of breath.  The patient was apparently found to be hypoxic with medics earlier today.  She was given DuoNebs as well as Solu-Medrol on the way to the emergency department.  The patient denies any associated chest pain.  She denies any recent fevers but does report a lot of cough recently.  She was placed on BiPAP here and is feeling very anxious.        Home Medications Prior to Admission medications   Medication Sig Start Date End Date Taking? Authorizing Provider  albuterol (PROVENTIL HFA) 108 (90 Base) MCG/ACT inhaler Inhale 2 puffs into the lungs every 4 (four) hours as needed for wheezing or shortness of breath. 02/08/23   Clapacs, Jackquline Denmark, MD  albuterol-ipratropium (COMBIVENT) 18-103 MCG/ACT inhaler Inhale 2 puffs into the lungs every 6 (six) hours as needed. For shortness of breath 02/08/23   Clapacs, Jackquline Denmark, MD  divalproex (DEPAKOTE ER) 500 MG 24 hr tablet Take 1 tablet (500 mg total) by mouth 2 (two) times daily. 02/08/23   Clapacs, Jackquline Denmark, MD  fluticasone furoate-vilanterol (BREO ELLIPTA) 200-25 MCG/ACT AEPB Inhale 1 puff into the lungs daily. 02/09/23   Clapacs, Jackquline Denmark, MD  hydrOXYzine (ATARAX) 25 MG tablet Take 1 tablet (25 mg total) by mouth 3 (three) times daily as needed for anxiety. 02/08/23   Clapacs, Jackquline Denmark, MD  magnesium citrate SOLN Take 296 mLs (1 Bottle total) by mouth daily as needed for severe constipation. 02/08/23   Clapacs, Jackquline Denmark, MD  nicotine (NICODERM CQ - DOSED IN MG/24 HOURS) 21 mg/24hr patch Place 1 patch (21 mg total) onto the skin daily. 02/09/23   Clapacs, Jackquline Denmark, MD  nicotine polacrilex  (NICORETTE) 2 MG gum Take 1 each (2 mg total) by mouth as needed for smoking cessation. 02/08/23   Clapacs, Jackquline Denmark, MD  PARoxetine (PAXIL) 10 MG tablet Take 1 tablet (10 mg total) by mouth daily. 02/09/23   Clapacs, Jackquline Denmark, MD  psyllium (HYDROCIL/METAMUCIL) 95 % PACK Take 1 packet by mouth daily. 02/09/23   Clapacs, Jackquline Denmark, MD  risperiDONE (RISPERDAL) 2 MG tablet Take 1 tablet (2 mg total) by mouth at bedtime. 02/08/23   Clapacs, Jackquline Denmark, MD  traMADol (ULTRAM) 50 MG tablet Take 1 tablet (50 mg total) by mouth every 6 (six) hours as needed for moderate pain. 02/08/23   Clapacs, Jackquline Denmark, MD  traZODone (DESYREL) 50 MG tablet Take 1 tablet (50 mg total) by mouth at bedtime as needed for sleep. 02/08/23   Clapacs, Jackquline Denmark, MD      Allergies    Hydrocodone-acetaminophen, Hydromorphone hcl, and Oxycodone-acetaminophen    Review of Systems   Review of Systems  Respiratory:  Positive for cough and shortness of breath.   All other systems reviewed and are negative.   Physical Exam Updated Vital Signs BP 107/73   Pulse 98   Temp 98.1 F (36.7 C) (Axillary)   Resp 19   Ht 5\' 5"  (1.651 m)   Wt 56.7 kg   LMP 10/13/2011   SpO2 96%   BMI 20.80 kg/m  Physical Exam Vitals and  nursing note reviewed.   Gen: Anxious on BiPAP, speaking in full sentences Eyes: PERRL, EOMI HEENT: no oropharyngeal swelling Neck: trachea midline Resp: Wheezes noted throughout all lung fields as well as some coarse breath sounds at the bilateral lung bases Card: RRR, no murmurs, rubs, or gallops Abd: nontender, nondistended Extremities: no calf tenderness, no edema Vascular: 2+ radial pulses bilaterally, 2+ DP pulses bilaterally Skin: no rashes Psyc: acting appropriately   ED Results / Procedures / Treatments   Labs (all labs ordered are listed, but only abnormal results are displayed) Labs Reviewed  RESP PANEL BY RT-PCR (RSV, FLU A&B, COVID)  [id number] - Abnormal; Notable for the following components:      Result Value    SARS Coronavirus 2 by RT PCR POSITIVE (*)    All other components within normal limits  COMPREHENSIVE METABOLIC PANEL - Abnormal; Notable for the following components:   Potassium 2.5 (*)    Glucose, Bld 112 (*)    Calcium 6.8 (*)    Total Protein 5.6 (*)    Albumin 2.9 (*)    Total Bilirubin 0.2 (*)    All other components within normal limits  CBC - Abnormal; Notable for the following components:   WBC 10.7 (*)    RBC 5.16 (*)    Hemoglobin 16.6 (*)    HCT 50.6 (*)    All other components within normal limits  BLOOD GAS, VENOUS - Abnormal; Notable for the following components:   pCO2, Ven 61 (*)    pO2, Ven <31 (*)    Bicarbonate 36.1 (*)    Acid-Base Excess 8.1 (*)    All other components within normal limits    EKG None  Radiology DG Chest Port 1 View  Result Date: 04/28/2023 CLINICAL DATA:  SOB EXAM: PORTABLE CHEST - 1 VIEW COMPARISON:  01/31/2023 FINDINGS: Lungs are clear, hyperinflated. Heart size and mediastinal contours are within normal limits. Aortic Atherosclerosis (ICD10-170.0). No effusion. Visualized bones unremarkable. IMPRESSION: Hyperinflation. No acute findings. Electronically Signed   By: Corlis Leak M.D.   On: 04/28/2023 14:03    Procedures Procedures    Medications Ordered in ED Medications  potassium chloride 10 mEq in 100 mL IVPB (10 mEq Intravenous New Bag/Given 04/28/23 1359)  azithromycin (ZITHROMAX) 500 mg in sodium chloride 0.9 % 250 mL IVPB (has no administration in time range)  LORazepam (ATIVAN) injection 0.5 mg (0.5 mg Intravenous Given 04/28/23 1315)  ipratropium-albuterol (DUONEB) 0.5-2.5 (3) MG/3ML nebulizer solution 3 mL (3 mLs Nebulization Given 04/28/23 1333)  ipratropium-albuterol (DUONEB) 0.5-2.5 (3) MG/3ML nebulizer solution 3 mL (3 mLs Nebulization Given 04/28/23 1331)  ipratropium-albuterol (DUONEB) 0.5-2.5 (3) MG/3ML nebulizer solution 3 mL (3 mLs Nebulization Given 04/28/23 1330)    ED Course/ Medical Decision Making/ A&P                                  Medical Decision Making 52 year old female with past medical history COPD and asthma as well as anxiety presenting to the emergency department today with shortness of breath.  The patient was hypoxic initially with medics.  She does have wheezing here on exam.  She is placed on BiPAP on arrival.  I will obtain basic labs here as well as a chest x-ray to evaluate for pulmonary edema, pulmonary infiltrates, or pneumothorax.  Also obtain a blood gas on the patient.  The patient is satting 100% and her heart rate is within  normal limits despite her being slightly tachypneic.  I suspect this is likely due to either asthma or COPD exacerbation.  Also obtain a COVID and flu swab.  The patient is very anxious here.  Since she is on positive pressure ventilation I will give her a small dose of Ativan to see if this will help.  She will require admission.  On reassessment the patient is breathing much easier.  She does appear to be hypercarbic but is compensating based on her venous blood gas interpreted by me.  The patient's potassium is low at 2.5.  IV potassium supplementation is ordered.  The patient's chest x-ray is unremarkable.  She is breathing much better and moving more air after the DuoNeb's.  Calls placed to hospitalist service for admission.  Critical care time 40 minutes including review of old records, frequent reassessments, treatment of severe respiratory distress with BiPAP, coordination of care with hospital service  Amount and/or Complexity of Data Reviewed Labs: ordered. Radiology: ordered.  Risk Prescription drug management. Decision regarding hospitalization.           Final Clinical Impression(s) / ED Diagnoses Final diagnoses:  COPD exacerbation (HCC)  Hypoxia  Dispo: admit  Rx / DC Orders ED Discharge Orders     None         Durwin Glaze, MD 04/28/23 [phone]

## 2023-04-28 NOTE — Progress Notes (Signed)
Pt on bipap, flutter valve will be attempted at later time

## 2023-04-28 NOTE — ED Notes (Signed)
ED TO INPATIENT HANDOFF REPORT  ED Nurse Name and Phone #: Thamas Jaegers Name/Age/Gender Stacy Bolton 52 y.o. female Room/Bed: RESA/RESA  Code Status   Code Status: Full Code  Home/SNF/Other Home Patient oriented to: self, place, time, and situation Is this baseline? Yes   Triage Complete: Triage complete  Chief Complaint Acute respiratory failure with hypoxia and hypercapnia (HCC) [J96.01, J96.02]  Triage Note BIB EMS from home. Pt has been feeling bad for 3 days with productive cough and worsening SOB. Upon EMS arriving pt O2 was in the 70s. Pt received 125mg  solumedrol, 0.3IM epi, 2g magnesium, duoneb and atrovent. Pt on CPAP with EMS. Pt still has labored breathing on arrival.    Allergies Allergies  Allergen Reactions   Hydrocodone-Acetaminophen     REACTION: itch   Hydromorphone Hcl     REACTION: itching   Oxycodone-Acetaminophen Itching    Level of Care/Admitting Diagnosis ED Disposition     ED Disposition  Admit   Condition  --   Comment  Hospital Area: [facility] [[id number]]  Level of Care: Stepdown [14]  Admit to SDU based on following criteria: Respiratory Distress:  Frequent assessment and/or intervention to maintain adequate ventilation/respiration, pulmonary toilet, and respiratory treatment.  May admit patient to [facility] or [facility] if equivalent level of care is available:: No  Covid Evaluation: Symptomatic Person Under Investigation (PUI) or recent exposure (last 10 days) *Testing Required*  Diagnosis: Acute respiratory failure with hypoxia and hypercapnia [facility]) [[id number]]  Admitting Physician: Almon Hercules [[id number]]  Attending Physician: Almon Hercules [[id number]]  Certification:: I certify this patient will need inpatient services for at least 2 midnights  Expected Medical Readiness: 04/30/2023          B Medical/Surgery History Past Medical History:  Diagnosis Date   Anxiety    Asthma    COPD (chronic  obstructive pulmonary disease) (HCC)    Dyspnea    Endometriosis    Seizure (HCC)    Past Surgical History:  Procedure Laterality Date   CHOLECYSTECTOMY       A IV Location/Drains/Wounds Patient Lines/Drains/Airways Status     Active Line/Drains/Airways     Name Placement date Placement time Site Days   Peripheral IV 04/28/23 20 G Left Antecubital 04/28/23  1300  Antecubital  less than 1   Peripheral IV 04/28/23 22 G Left;Posterior Hand 04/28/23  1301  Hand  less than 1            Intake/Output Last 24 hours  Intake/Output Summary (Last 24 hours) at 04/28/2023 1504 Last data filed at 04/28/2023 1455 Gross per 24 hour  Intake 100 ml  Output --  Net 100 ml    Labs/Imaging Results for orders placed or performed during the hospital encounter of 04/28/23 (from the past 48 hour(s))  Comprehensive metabolic panel     Status: Abnormal   Collection Time: 04/28/23  1:13 PM  Result Value Ref Range   Sodium 143 135 - 145 mmol/L   Potassium 2.5 (LL) 3.5 - 5.1 mmol/L    Comment: CRITICAL RESULT CALLED TO, READ BACK BY AND VERIFIED WITH SHEPHERD,H. RN AT 1340 04/28/23 MULLINS,T    Chloride 111 98 - 111 mmol/L   CO2 25 22 - 32 mmol/L   Glucose, Bld 112 (H) 70 - 99 mg/dL    Comment: Glucose reference range applies only to samples taken after fasting for at least 8 hours.   BUN 7 6 - 20 mg/dL  Creatinine, Ser 0.56 0.44 - 1.00 mg/dL   Calcium 6.8 (L) 8.9 - 10.3 mg/dL   Total Protein 5.6 (L) 6.5 - 8.1 g/dL   Albumin 2.9 (L) 3.5 - 5.0 g/dL   AST 18 15 - 41 U/L   ALT 14 0 - 44 U/L   Alkaline Phosphatase 68 38 - 126 U/L   Total Bilirubin 0.2 (L) 0.3 - 1.2 mg/dL   GFR, Estimated >16 >10 mL/min    Comment: (NOTE) Calculated using the CKD-EPI Creatinine Equation (2021)    Anion gap 7 5 - 15    Comment: Performed at [facility], [address], [place], [place] [address]  CBC     Status: Abnormal   Collection Time: 04/28/23  1:13 PM  Result Value Ref Range    WBC 10.7 (H) 4.0 - 10.5 K/uL   RBC 5.16 (H) 3.87 - 5.11 MIL/uL   Hemoglobin 16.6 (H) 12.0 - 15.0 g/dL   HCT 40.9 (H) 81.1 - 91.4 %   MCV 98.1 80.0 - 100.0 fL   MCH 32.2 26.0 - 34.0 pg   MCHC 32.8 30.0 - 36.0 g/dL   RDW 78.2 95.6 - 21.3 %   Platelets 262 150 - 400 K/uL   nRBC 0.0 0.0 - 0.2 %    Comment: Performed at [facility], [address], [place], [place] [address]  Blood gas, venous     Status: Abnormal   Collection Time: 04/28/23  1:17 PM  Result Value Ref Range   pH, Ven 7.38 7.25 - 7.43   pCO2, Ven 61 (H) 44 - 60 mmHg   pO2, Ven <31 (LL) 32 - 45 mmHg    Comment: CRITICAL RESULT CALLED TO, READ BACK BY AND VERIFIED WITH: SAVOIE, B 04/28/2023 1336 COATNEY, S.     Bicarbonate 36.1 (H) 20.0 - 28.0 mmol/L   Acid-Base Excess 8.1 (H) 0.0 - 2.0 mmol/L   O2 Saturation 41.6 %   Patient temperature 37.0     Comment: Performed at [facility], [address] [place], [place] [address]  Resp panel by RT-PCR (RSV, Flu A&B, Covid) Anterior Nasal Swab     Status: Abnormal   Collection Time: 04/28/23  2:00 PM   Specimen: Anterior Nasal Swab  Result Value Ref Range   SARS Coronavirus 2 by RT PCR POSITIVE (A) NEGATIVE    Comment: (NOTE) SARS-CoV-2 target nucleic acids are DETECTED.  The SARS-CoV-2 RNA is generally detectable in upper respiratory specimens during the acute phase of infection. Positive results are indicative of the presence of the identified virus, but do not rule out bacterial infection or co-infection with other pathogens not detected by the test. Clinical correlation with patient history and other diagnostic information is necessary to determine patient infection status. The expected result is Negative.  Fact Sheet for Patients: BloggerCourse.com  Fact Sheet for Healthcare Providers: SeriousBroker.it  This test is not yet approved or cleared by the [place] FDA and  has  been authorized for detection and/or diagnosis of SARS-CoV-2 by FDA under an Emergency Use Authorization (EUA).  This EUA will remain in effect (meaning this test can be used) for the duration of  the COVID-19 declaration under Section 564(b)(1) of the A ct, 21 U.S.C. section 360bbb-3(b)(1), unless the authorization is terminated or revoked sooner.     Influenza A by PCR NEGATIVE NEGATIVE   Influenza B by PCR NEGATIVE NEGATIVE    Comment: (NOTE) The Xpert Xpress SARS-CoV-2/FLU/RSV plus assay  is intended as an aid in the diagnosis of influenza from Nasopharyngeal swab specimens and should not be used as a sole basis for treatment. Nasal washings and aspirates are unacceptable for Xpert Xpress SARS-CoV-2/FLU/RSV testing.  Fact Sheet for Patients: BloggerCourse.com  Fact Sheet for Healthcare Providers: SeriousBroker.it  This test is not yet approved or cleared by the [place] FDA and has been authorized for detection and/or diagnosis of SARS-CoV-2 by FDA under an Emergency Use Authorization (EUA). This EUA will remain in effect (meaning this test can be used) for the duration of the COVID-19 declaration under Section 564(b)(1) of the Act, 21 U.S.C. section 360bbb-3(b)(1), unless the authorization is terminated or revoked.     Resp Syncytial Virus by PCR NEGATIVE NEGATIVE    Comment: (NOTE) Fact Sheet for Patients: BloggerCourse.com  Fact Sheet for Healthcare Providers: SeriousBroker.it  This test is not yet approved or cleared by the [place] FDA and has been authorized for detection and/or diagnosis of SARS-CoV-2 by FDA under an Emergency Use Authorization (EUA). This EUA will remain in effect (meaning this test can be used) for the duration of the COVID-19 declaration under Section 564(b)(1) of the Act, 21 U.S.C. section 360bbb-3(b)(1), unless the authorization is  terminated or revoked.  Performed at [facility], [address] [place], [place] [address]    DG Chest Port 1 View  Result Date: 04/28/2023 CLINICAL DATA:  SOB EXAM: PORTABLE CHEST - 1 VIEW COMPARISON:  01/31/2023 FINDINGS: Lungs are clear, hyperinflated. Heart size and mediastinal contours are within normal limits. Aortic Atherosclerosis (ICD10-170.0). No effusion. Visualized bones unremarkable. IMPRESSION: Hyperinflation. No acute findings. Electronically Signed   By: Corlis Leak M.D.   On: 04/28/2023 14:03    Pending Labs Unresulted Labs (From admission, onward)     Start     Ordered   05/05/23 0500  Creatinine, serum  (enoxaparin (LOVENOX)    CrCl >/= 30 ml/min)  Weekly,   R     Comments: while on enoxaparin therapy    04/28/23 1503   04/29/23 0500  Basic metabolic panel  Tomorrow morning,   R        04/28/23 1503   04/29/23 0500  CBC  Tomorrow morning,   R        04/28/23 1503   04/28/23 1503  HIV Antibody (routine testing w rflx)  (HIV Antibody (Routine testing w reflex) panel)  Once,   R        04/28/23 1503            Vitals/Pain Today's Vitals   04/28/23 1400 04/28/23 1430 04/28/23 1457 04/28/23 1500  BP: 117/76 107/73  105/70  Pulse: 95 98 88 95  Resp: 14 19 16  (!) 23  Temp:      TempSrc:      SpO2: 100% 96% 98% 98%  Weight:      Height:      PainSc:        Isolation Precautions No active isolations  Medications Medications  potassium chloride 10 mEq in 100 mL IVPB (10 mEq Intravenous New Bag/Given 04/28/23 1456)  azithromycin (ZITHROMAX) 500 mg in sodium chloride 0.9 % 250 mL IVPB (500 mg Intravenous New Bag/Given 04/28/23 1456)  enoxaparin (LOVENOX) injection 40 mg (has no administration in time range)  acetaminophen (TYLENOL) tablet 650 mg (has no administration in time range)    Or  acetaminophen (TYLENOL) suppository 650 mg (has no administration in time range)  ondansetron (ZOFRAN) tablet 4 mg (has  no administration in time  range)    Or  ondansetron (ZOFRAN) injection 4 mg (has no administration in time range)  LORazepam (ATIVAN) injection 0.5 mg (0.5 mg Intravenous Given 04/28/23 1315)  ipratropium-albuterol (DUONEB) 0.5-2.5 (3) MG/3ML nebulizer solution 3 mL (3 mLs Nebulization Given 04/28/23 1333)  ipratropium-albuterol (DUONEB) 0.5-2.5 (3) MG/3ML nebulizer solution 3 mL (3 mLs Nebulization Given 04/28/23 1331)  ipratropium-albuterol (DUONEB) 0.5-2.5 (3) MG/3ML nebulizer solution 3 mL (3 mLs Nebulization Given 04/28/23 1330)    Mobility walks     Focused Assessments     R Recommendations: See Admitting Provider Note  Report given to:   Additional Notes:

## 2023-04-29 DIAGNOSIS — E43 Unspecified severe protein-calorie malnutrition: Secondary | ICD-10-CM | POA: Insufficient documentation

## 2023-04-29 LAB — BASIC METABOLIC PANEL
Anion gap: 5 (ref 5–15)
BUN: 11 mg/dL (ref 6–20)
CO2: 24 mmol/L (ref 22–32)
Calcium: 8.2 mg/dL — ABNORMAL LOW (ref 8.9–10.3)
Chloride: 112 mmol/L — ABNORMAL HIGH (ref 98–111)
Creatinine, Ser: 0.44 mg/dL (ref 0.44–1.00)
GFR, Estimated: >60 mL/min (ref >60)
Glucose, Bld: 136 mg/dL — ABNORMAL HIGH (ref 70–99)
Potassium: 4.5 mmol/L (ref 3.5–5.1)
Sodium: 141 mmol/L (ref 135–145)

## 2023-04-29 LAB — CBC
HCT: 44 % (ref 36.0–46.0)
Hemoglobin: 14.4 g/dL (ref 12.0–15.0)
MCH: 32.1 pg (ref 26.0–34.0)
MCHC: 32.7 g/dL (ref 30.0–36.0)
MCV: 98 fL (ref 80.0–100.0)
Platelets: 226 10*3/uL (ref 150–400)
RBC: 4.49 MIL/uL (ref 3.87–5.11)
RDW: 13.5 % (ref 11.5–15.5)
WBC: 6.1 10*3/uL (ref 4.0–10.5)
nRBC: 0 % (ref 0.0–0.2)

## 2023-04-29 LAB — HIV ANTIBODY (ROUTINE TESTING W REFLEX): HIV Screen 4th Generation wRfx: NONREACTIVE

## 2023-04-29 MED ORDER — TRAZODONE HCL 50 MG PO TABS
50.0000 mg | ORAL_TABLET | Freq: Every evening | ORAL | Status: DC | PRN
  Administered 2023-04-29 – 2023-05-02 (×4): 50 mg via ORAL
  Filled 2023-04-29 (×4): qty 1

## 2023-04-29 MED ORDER — ADULT MULTIVITAMIN W/MINERALS CH
1.0000 | ORAL_TABLET | Freq: Every day | ORAL | Status: DC
  Administered 2023-04-29 – 2023-05-03 (×5): 1 via ORAL
  Filled 2023-04-29 (×5): qty 1

## 2023-04-29 MED ORDER — RISPERIDONE 1 MG PO TABS
2.0000 mg | ORAL_TABLET | Freq: Every day | ORAL | Status: DC
  Administered 2023-04-29 – 2023-05-02 (×4): 2 mg via ORAL
  Filled 2023-04-29 (×4): qty 2

## 2023-04-29 MED ORDER — LORATADINE 10 MG PO TABS
10.0000 mg | ORAL_TABLET | Freq: Every day | ORAL | Status: DC
  Administered 2023-04-29 – 2023-05-03 (×5): 10 mg via ORAL
  Filled 2023-04-29 (×5): qty 1

## 2023-04-29 MED ORDER — IPRATROPIUM-ALBUTEROL 0.5-2.5 (3) MG/3ML IN SOLN
3.0000 mL | RESPIRATORY_TRACT | Status: DC | PRN
  Administered 2023-04-29 – 2023-05-02 (×2): 3 mL via RESPIRATORY_TRACT
  Filled 2023-04-29 (×2): qty 3

## 2023-04-29 MED ORDER — HYDROXYZINE HCL 10 MG PO TABS
10.0000 mg | ORAL_TABLET | Freq: Once | ORAL | Status: AC | PRN
  Administered 2023-04-29: 10 mg via ORAL
  Filled 2023-04-29: qty 1

## 2023-04-29 MED ORDER — PAROXETINE HCL 10 MG PO TABS
10.0000 mg | ORAL_TABLET | Freq: Every day | ORAL | Status: DC
  Administered 2023-04-29 – 2023-04-30 (×2): 10 mg via ORAL
  Filled 2023-04-29 (×2): qty 1

## 2023-04-29 MED ORDER — FLUTICASONE FUROATE-VILANTEROL 100-25 MCG/ACT IN AEPB
1.0000 | INHALATION_SPRAY | Freq: Every day | RESPIRATORY_TRACT | Status: DC
  Administered 2023-04-29 – 2023-05-03 (×5): 1 via RESPIRATORY_TRACT
  Filled 2023-04-29: qty 28

## 2023-04-29 MED ORDER — DIVALPROEX SODIUM ER 500 MG PO TB24
500.0000 mg | ORAL_TABLET | Freq: Two times a day (BID) | ORAL | Status: DC
  Administered 2023-04-29 – 2023-04-30 (×2): 500 mg via ORAL
  Filled 2023-04-29 (×2): qty 1

## 2023-04-29 MED ORDER — UMECLIDINIUM BROMIDE 62.5 MCG/ACT IN AEPB
1.0000 | INHALATION_SPRAY | Freq: Every day | RESPIRATORY_TRACT | Status: DC
  Administered 2023-04-29 – 2023-05-03 (×5): 1 via RESPIRATORY_TRACT
  Filled 2023-04-29: qty 7

## 2023-04-29 MED ORDER — ENSURE ENLIVE PO LIQD
237.0000 mL | Freq: Three times a day (TID) | ORAL | Status: DC
  Administered 2023-04-29 – 2023-05-03 (×13): 237 mL via ORAL

## 2023-04-29 MED ORDER — HYDROXYZINE HCL 25 MG PO TABS
25.0000 mg | ORAL_TABLET | Freq: Three times a day (TID) | ORAL | Status: DC | PRN
  Administered 2023-04-29 – 2023-05-03 (×7): 25 mg via ORAL
  Filled 2023-04-29 (×8): qty 1

## 2023-04-29 MED ORDER — FLUTICASONE PROPIONATE 50 MCG/ACT NA SUSP
2.0000 | Freq: Every day | NASAL | Status: DC
  Administered 2023-04-29 – 2023-05-03 (×5): 2 via NASAL
  Filled 2023-04-29 (×2): qty 16
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
## 2023-04-29 NOTE — Progress Notes (Signed)
RT called to pts room due to pt being SOB after coughing event.  RN at bedside.  Pt given PRN neb and scheduled DPIs that were in the room.  Pt states after taking resp meds she feels better.  Pt does have a stopped up nose and blew it several times before placing pt on 2l [place].  Pts sats are 99% on 2L [place].  HR 79, RR 22.  RN notifying MD.

## 2023-04-29 NOTE — Progress Notes (Signed)
Initial Nutrition Assessment  DOCUMENTATION CODES:   Severe malnutrition in context of social or environmental circumstances, Underweight  INTERVENTION:   -Ensure Plus High Protein po TID, each supplement provides 350 kcal and 20 grams of protein.   -Magic cup BID with meals, each supplement provides 290 kcal and 9 grams of protein   -Multivitamin with minerals daily  NUTRITION DIAGNOSIS:   Severe Malnutrition related to social / environmental circumstances as evidenced by severe muscle depletion, moderate fat depletion, percent weight loss.  GOAL:   Patient will meet greater than or equal to 90% of their needs  MONITOR:   PO intake, Supplement acceptance, Labs, Weight trends, I & O's  REASON FOR ASSESSMENT:   Consult Assessment of nutrition requirement/status  ASSESSMENT:   52 year old F with PMH of COPD/asthma, bipolar disorder, seizure disorder, anxiety, homelessness and polysubstance use including tobacco, alcohol, marijuana and cocaine, and noncompliance brought to ED by EMS due to runny nose, sore throat, cough, progressive SOB and hypoxemia to 70%, and admitted for acute respiratory failure and COPD exacerbation in the setting of COVID-19 infection.  Patient currently homeless, pt with food insecurity. Pt has also been drinking alcohol and using marijuana and cocaine. Ensure has been ordered for patient, will increase to TID. Will add daily MVI given substance abuse and malnutrition. Will also add Magic cups to lunch and dinner meals. Pt ordering 2-3 trays at a time. Consumed 100% of breakfast trays this morning.   Per weight records, pt has lost 21 lbs since 5/5 (16% wt loss x 4 months, significant for time frame).   Medications reviewed.   Labs reviewed.  NUTRITION - FOCUSED PHYSICAL EXAM:  Flowsheet Row Most Recent Value  Orbital Region Moderate depletion  Upper Arm Region Severe depletion  Thoracic and Lumbar Region Unable to assess  Buccal Region Moderate  depletion  Temple Region Severe depletion  Clavicle Bone Region Severe depletion  Clavicle and Acromion Bone Region Severe depletion  Scapular Bone Region Severe depletion  Dorsal Hand Severe depletion  Patellar Region Unable to assess  Anterior Thigh Region Unable to assess  Posterior Calf Region Unable to assess  Edema (RD Assessment) None       Diet Order:   Diet Order             Diet regular Room service appropriate? Yes; Fluid consistency: Thin  Diet effective now                   EDUCATION NEEDS:   No education needs have been identified at this time  Skin:  Skin Assessment: Reviewed RN Assessment  Last BM:  9/1  Height:   Ht Readings from Last 1 Encounters:  04/29/23 5\' 5"  (1.651 m)    Weight:   Wt Readings from Last 1 Encounters:  04/29/23 46.8 kg    BMI:  Body mass index is 17.17 kg/m.  Estimated Nutritional Needs:   Kcal:  1700-1900  Protein:  85-100g  Fluid:  1.9L/day   Tilda Franco, MS, RD, LDN Inpatient Clinical Dietitian Contact information available via Amion

## 2023-04-29 NOTE — Progress Notes (Signed)
Chaplain engaged in an initial visit with Stacy Bolton by phone. She was sleeping and wanted Chaplain to call back later. Chaplain will follow-up.     04/29/23 1100  Spiritual Encounters  Type of Visit Initial  Care provided to: Patient  Reason for visit Routine spiritual support
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
## 2023-04-29 NOTE — Plan of Care (Signed)
Problem: Education: Goal: Knowledge of risk factors and measures for prevention of condition will improve Outcome: Progressing   Problem: Coping: Goal: Psychosocial and spiritual needs will be supported Outcome: Progressing   Problem: Respiratory: Goal: Will maintain a patent airway Outcome: Progressing   Problem: Clinical Measurements: Goal: Ability to maintain clinical measurements within normal limits will improve Outcome: Progressing   Haydee Salter, RN 04/29/23 10:46 AM

## 2023-04-30 LAB — RAPID URINE DRUG SCREEN, HOSP PERFORMED
Amphetamines: NOT DETECTED
Barbiturates: NOT DETECTED
Benzodiazepines: NOT DETECTED
Cocaine: POSITIVE — AB
Opiates: NOT DETECTED
Tetrahydrocannabinol: POSITIVE — AB

## 2023-04-30 MED ORDER — NICOTINE 21 MG/24HR TD PT24
21.0000 mg | MEDICATED_PATCH | Freq: Every day | TRANSDERMAL | Status: DC
  Administered 2023-04-30 – 2023-05-03 (×5): 21 mg via TRANSDERMAL
  Filled 2023-04-30 (×5): qty 1

## 2023-04-30 MED ORDER — RISPERIDONE 1 MG PO TBDP: 1.0000 mg | ORAL_TABLET | Freq: Two times a day (BID) | ORAL | Status: DC | PRN

## 2023-04-30 MED ORDER — AZITHROMYCIN 250 MG PO TABS: 500.0000 mg | ORAL_TABLET | Freq: Every day | ORAL | Status: DC
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
## 2023-04-30 NOTE — Progress Notes (Signed)
Notified Celeste in lab that UDS re-collected and being sent now. Requisition form updated with current date and time. Haydee Salter, RN 04/30/23 9:40 AM

## 2023-04-30 NOTE — TOC Initial Note (Signed)
Transition of Care [facility]) - Initial/Assessment Note   Patient Details  Name: Stacy Bolton MRN: [id number] Date of Birth: 02/19/1971  Transition of Care [facility]) CM/SW Contact:    Ewing Schlein, LCSW Phone Number: 04/30/2023, 11:24 AM  Clinical Narrative: CSW added SDOH resources to AVS. TOC anticipating possible discharge to [facility].                 Expected Discharge Plan: Homeless Shelter [facility]) Barriers to Discharge: Continued Medical Work up  Expected Discharge Plan and Services In-house Referral: Clinical Social Work Living arrangements for the past 2 months: Homeless             DME Arranged: N/A DME Agency: NA  Prior Living Arrangements/Services Living arrangements for the past 2 months: Homeless Patient language and need for interpreter reviewed:: Yes Need for Family Participation in Patient Care: Yes (Comment) Care giver support system in place?: No (comment) Criminal Activity/Legal Involvement Pertinent to Current Situation/Hospitalization: No - Comment as needed  Activities of Daily Living Home Assistive Devices/Equipment: None ADL Screening (condition at time of admission) Patient's cognitive ability adequate to safely complete daily activities?: Yes Is the patient deaf or have difficulty hearing?: No Does the patient have difficulty seeing, even when wearing glasses/contacts?: No Does the patient have difficulty concentrating, remembering, or making decisions?: No Patient able to express need for assistance with ADLs?: Yes Does the patient have difficulty dressing or bathing?: No Independently performs ADLs?: Yes (appropriate for developmental age) Communication: Independent Dressing (OT): Independent Grooming: Independent Feeding: Independent Bathing: Independent Toileting: Independent Does the patient have difficulty walking or climbing stairs?: No Weakness of Legs: None Weakness of Arms/Hands: None  Emotional Assessment Orientation: : Oriented to Self,  Oriented to Place, Oriented to  Time, Oriented to Situation Alcohol / Substance Use: Illicit Drugs Psych Involvement: Yes (comment)  Admission diagnosis:  Hypoxia [R09.02] COPD exacerbation (HCC) [J44.1] Acute respiratory failure with hypoxia and hypercapnia (HCC) [J96.01, J96.02] Patient Active Problem List   Diagnosis Date Noted   Protein-calorie malnutrition, severe 04/29/2023   Acute respiratory failure with hypoxia and hypercapnia (HCC) 04/28/2023   Bipolar I disorder, most recent episode (or current) manic (HCC) 01/31/2023   Cocaine abuse (HCC) 01/31/2023   Acute psychosis (HCC) 01/30/2023   COPD exacerbation (HCC) 01/16/2023   NAUSEA 03/27/2010   Bipolar disorder (HCC) 03/21/2010   Anxiety state 03/21/2010   ASTHMA, UNSPECIFIED, UNSPECIFIED STATUS 03/21/2010   PYELONEPHRITIS 03/21/2010   History of seizure 03/21/2010   BONE FRACTURE 03/21/2010   PCP:  Patient, No Pcp Per Pharmacy:   [facility] [address] [place], [place] - [address] [place] [address] Phone: [phone] Fax: [phone]  [id number] - [address] ([place]), [place] - [address] [place] [address] Phone: [phone] Fax: [phone]  [facility] [address] [place] [address] Phone: [phone] Fax: [phone]  [facility] [address] [place] [address] Phone: [phone] Fax: [phone]  Social Determinants of Health (SDOH) Social History: SDOH Screenings   Food Insecurity: Food Insecurity Present (04/28/2023)  Housing: High Risk (04/28/2023)  Transportation Needs: Unmet Transportation Needs (04/28/2023)  Utilities: At Risk (04/28/2023)  Alcohol Screen: Low Risk  (01/31/2023)  Financial Resource Strain: High Risk (01/16/2023)   Received from [facility], [facility]  Physical Activity: Sufficiently Active (01/16/2023)    Received from [facility], [facility]  Social Connections: Socially Isolated (01/16/2023)   Received from [facility], [facility]  Tobacco Use: High Risk (04/28/2023)  Health Literacy: Low Risk  (01/16/2023)   Received from [facility], [facility]   SDOH Interventions: Food Insecurity Interventions: Inpatient TOC, Other (Comment) (Food pantry resources added to AVS.) Housing Interventions: Inpatient TOC, Other (Comment) Transportation Interventions: Inpatient TOC, Other (Comment) (TOC can provide transportation to [facility] after discharge.) Utilities Interventions: Intervention Not Indicated, Inpatient TOC (Patient is homeless and does not have utilities.)  Readmission Risk Interventions    04/30/2023   11:24 AM  Readmission Risk Prevention Plan  Transportation Screening Complete  HRI or Home Care Consult Complete  SW Recovery Care/Counseling Consult Complete  Palliative Care Screening Not Applicable  Skilled Nursing Facility Not Applicable

## 2023-04-30 NOTE — Progress Notes (Signed)
OT Cancellation Note  Patient Details Name: Stacy Bolton MRN: [id number] DOB: 02-16-1971   Cancelled Treatment:    Reason Eval/Treat Not Completed: Medical issues which prohibited therapy: Pt presented in bed, pressured speech, not congruent with questions. Pt became increasingly agitated just talking, and declined all OT's offers for help.   Pt not in a safe mental state to begin OT Evaluation. Pt also turned out light, lay down and turned away from OT as OT asked to assess her.  Pt was continuing to speak rapidly about going to sleep and "I've had it!" RN was notified.   Theodoro Clock 04/30/2023, 10:30 AM

## 2023-04-30 NOTE — Plan of Care (Signed)
Problem: Coping: Goal: Psychosocial and spiritual needs will be supported Outcome: Progressing   Problem: Respiratory: Goal: Will maintain a patent airway Outcome: Progressing   Problem: Clinical Measurements: Goal: Ability to maintain clinical measurements within normal limits will improve Outcome: Progressing   Problem: Activity: Goal: Risk for activity intolerance will decrease Outcome: Progressing   Haydee Salter, RN 04/30/23 4:55 PM

## 2023-04-30 NOTE — Progress Notes (Signed)
O2 sats sitting 91% and when she began ambulating. With ambulation patient began coughing and sats increased to 94%. Dr. Alanda Slim updated. Haydee Salter, RN 04/30/23 10:53 AM

## 2023-04-30 NOTE — Consult Note (Addendum)
[facility] Face-to-Face Psychiatry Consult   Reason for Consult:  Agitation,  Referring Physician:  Dr. Alanda Slim  Patient Identification: Stacy Bolton MRN:  [id number] Principal Diagnosis: Acute respiratory failure with hypoxia and hypercapnia (HCC) Diagnosis:  Principal Problem:   Acute respiratory failure with hypoxia and hypercapnia (HCC) Active Problems:   Bipolar disorder (HCC)   Anxiety state   History of seizure   COPD exacerbation (HCC)   Protein-calorie malnutrition, severe   Total Time spent with patient: 1 hour  Subjective:   Stacy Bolton is a 52 y.o. female patient admitted with COPD exacerbation. Patient is seen and chart reviewed, case discussed with Dr. Gasper Sells.  Patient is a 52 year old female who presented to [facility] and was admitted for acute respiratory failure with hypoxia and hypercapnia.  Psychiatry consult service was placed due to increasing agitation, medication review, polysubstance use.  Patient immediately apologizes for her behavior after identifying self.  She states " I am sorry I am very disheveled, upset, and likely withdrawing from crack.  She apologizes for talking to nursing staff, and" flying off the handle with the medical doctor today."  She describes herself as nice, compassionate, and caring individual.  She states that she is uncomfortable being in the hospital, feels isolated and trapped (noted to be COVID-positive), withdrawing from crack and alcohol.  She reports that she continues to remain under a lot of stress since March 2024, she feels as if she has been in a tunnel and not able to get the help she needs.  This past statement contradicts patient's history, as she is noted to have an admission at [facility] for 8 days.  During her hospital stay she was medicated with Risperdal, paroxetine, hydroxyzine, Depakote, and trazodone.  As of today she has not taken any medications since discharge, and has been medicating  with cocaine and marijuana pretty frequently.  Patient reports she is open to substance abuse outpatient clinic, however would like to get medical treatment first " I will try my best to keep it together and not flip out on anyone."  Patient is overheard on the phone very disgruntled, verbally aggressive, and expressing negative comments about the hospital and and staff.  Again she apologizes for her behavior earlier this morning, requests use of as needed medication and has no interest in resuming scheduled meds at this time as she is here for COVID infection and COPD exacerbation.  She denies any acute symptoms of mania at this time to include impulsivity, grandiosity, mood lability, hypersexuality.  However she does present with pressured speech, mood lability, and irritability during evaluation.  She further denies any depressive symptoms to include anhedonia, hopelessness, worthlessness, guilty, suicidal.  She denies any acute psychosis, paranoia.  She does not appear to be displaying any or responding to internal stimuli, external stimuli, or exhibiting delusional thought disorder.  Patient denies any access to weapons.  Patient does admit to recent crack cocaine use, alcohol use, nicotine use.  She is currently not receiving any outpatient services, however would like to be reestablished with [facility] community support team. Patient denies any auditory and/or visual hallucinations, does not appear to be responding to internal or external stimuli.  There is no evidence of delusional thought content and patient appears to answer all questions appropriately.  At this time patient appears to be psychiatrically stable, while she does present with some symptoms of mania she is not an acute danger to self or others that warrant involuntary commitment or forced  medications.  She did agree to take as needed medication for agitation.  [id number] A [name] is a 52 y.o. female with PMH of COPD/asthma, anxiety,  bipolar disorder, seizure disorder and tobacco use disorder brought to ED by EMS due to productive cough, progressive shortness of breath and hypoxemia to 70%.   Patient reports shortness of breath, productive cough with whitish phlegm, runny nose, sore throat, nausea and diarrhea for 2 days.  Symptoms progressively worse and that prompted her to call EMS.  Also reports chest pain with cough.  Denies abdominal pain other than feeling queasy.  Denies fever or chills.  Denies sick contacts.  She denies UTI symptoms or focal neurosymptoms.  Reports compliance with her inhalers.  Past Psychiatric History: Bipolar disorder vs schizoaffective disorder, cocaine use disorder. She has an extensive psychiatric history, predominantly non compliant. Multiple hospitalizations last admission 01/2023 [facility]. Previous medications include Depakote, Risperdal. Denies any legal charges.   Risk to Self:   Denies Risk to Others:  Denies Prior Inpatient Therapy: Yes.   If yes, describe a little unclear.  I do not have clear records of that but she mentions getting treatment while incarcerated at least Prior Outpatient Therapy: Yes.   If yes, describe apparently has seen people for medication but cannot really answer questions about who.   Past Medical History:  Past Medical History:  Diagnosis Date   Anxiety    Asthma    COPD (chronic obstructive pulmonary disease) (HCC)    Dyspnea    Endometriosis    Seizure (HCC)     Past Surgical History:  Procedure Laterality Date   CHOLECYSTECTOMY     Family History: History reviewed. No pertinent family history. Family Psychiatric  History: Denies Social History:  Social History   Substance and Sexual Activity  Alcohol Use Not Currently     Social History   Substance and Sexual Activity  Drug Use Yes   Types: Cocaine, Marijuana   Comment: EVERY THREE DAYS    Social History   Socioeconomic History   Marital status: Single    Spouse name: Not on file    Number of children: Not on file   Years of education: Not on file   Highest education level: Not on file  Occupational History   Not on file  Tobacco Use   Smoking status: Every Day    Current packs/day: 1.50    Average packs/day: 1.5 packs/day for 30.0 years (45.0 ttl pk-yrs)    Types: Cigarettes   Smokeless tobacco: Not on file  Vaping Use   Vaping status: Never Used  Substance and Sexual Activity   Alcohol use: Not Currently   Drug use: Yes    Types: Cocaine, Marijuana    Comment: EVERY THREE DAYS   Sexual activity: Not Currently  Other Topics Concern   Not on file  Social History Narrative   Not on file   Social Determinants of Health   Financial Resource Strain: High Risk (01/16/2023)   Received from [facility], [facility]   Overall Financial Resource Strain (CARDIA)    Difficulty of Paying Living Expenses: Very hard  Food Insecurity: Food Insecurity Present (04/28/2023)   Hunger Vital Sign    Worried About Running Out of Food in the Last Year: Often true    Ran Out of Food in the Last Year: Often true  Transportation Needs: Unmet Transportation Needs (04/28/2023)   PRAPARE - Administrator, Civil Service (Medical): Yes  Lack of Transportation (Non-Medical): Yes  Physical Activity: Sufficiently Active (01/16/2023)   Received from [facility], [facility]   Exercise Vital Sign    Days of Exercise per Week: 7 days    Minutes of Exercise per Session: 150+ min  Stress: Not on file  Social Connections: Socially Isolated (01/16/2023)   Received from [facility], [facility]   Social Connection and Isolation Panel [NHANES]    Frequency of Communication with Friends and Family: More than three times a week    Frequency of Social Gatherings with Friends and Family: Never    Attends Religious Services: Never    Database administrator or Organizations: No    Attends Banker Meetings: Never    Marital Status: Separated    Additional Social History:    Allergies:   Allergies  Allergen Reactions   Hydrocodone-Acetaminophen     REACTION: itch   Hydromorphone Hcl     REACTION: itching   Oxycodone-Acetaminophen Itching    Labs:  Results for orders placed or performed during the hospital encounter of 04/28/23 (from the past 48 hour(s))  HIV Antibody (routine testing w rflx)     Status: None   Collection Time: 04/28/23  4:39 PM  Result Value Ref Range   HIV Screen 4th Generation wRfx Non Reactive Non Reactive    Comment: Performed at [facility], [address] [place], [place] [address]  C-reactive protein     Status: None   Collection Time: 04/28/23  4:39 PM  Result Value Ref Range   CRP 0.6 <1.0 mg/dL    Comment: Performed at [facility], [address], [place], [place] [address]  Basic metabolic panel     Status: Abnormal   Collection Time: 04/29/23  2:59 AM  Result Value Ref Range   Sodium 141 135 - 145 mmol/L   Potassium 4.5 3.5 - 5.1 mmol/L   Chloride 112 (H) 98 - 111 mmol/L   CO2 24 22 - 32 mmol/L   Glucose, Bld 136 (H) 70 - 99 mg/dL    Comment: Glucose reference range applies only to samples taken after fasting for at least 8 hours.   BUN 11 6 - 20 mg/dL   Creatinine, Ser 4.74 0.44 - 1.00 mg/dL   Calcium 8.2 (L) 8.9 - 10.3 mg/dL   GFR, Estimated >25 >95 mL/min    Comment: (NOTE) Calculated using the CKD-EPI Creatinine Equation (2021)    Anion gap 5 5 - 15    Comment: Performed at [facility], [address], [place], [place] [address]  CBC     Status: None   Collection Time: 04/29/23  2:59 AM  Result Value Ref Range   WBC 6.1 4.0 - 10.5 K/uL   RBC 4.49 3.87 - 5.11 MIL/uL   Hemoglobin 14.4 12.0 - 15.0 g/dL   HCT 64.3 32.9 - 51.8 %   MCV 98.0 80.0 - 100.0 fL   MCH 32.1 26.0 - 34.0 pg   MCHC 32.7 30.0 - 36.0 g/dL   RDW 84.1 66.0 - 63.0 %   Platelets 226 150 - 400 K/uL   nRBC 0.0 0.0 - 0.2 %    Comment: Performed at [facility], [address] [place], [place] [address]  Rapid urine drug screen (hospital performed)     Status: Abnormal   Collection Time: 04/30/23  9:30 AM  Result Value Ref Range   Opiates NONE  DETECTED NONE DETECTED   Cocaine POSITIVE (A) NONE DETECTED   Benzodiazepines NONE DETECTED NONE DETECTED   Amphetamines NONE DETECTED NONE DETECTED   Tetrahydrocannabinol POSITIVE (A) NONE DETECTED   Barbiturates NONE DETECTED NONE DETECTED    Comment: (NOTE) DRUG SCREEN FOR MEDICAL PURPOSES ONLY.  IF CONFIRMATION IS NEEDED FOR ANY PURPOSE, NOTIFY LAB WITHIN 5 DAYS.  LOWEST DETECTABLE LIMITS FOR URINE DRUG SCREEN Drug Class                     Cutoff (ng/mL) Amphetamine and metabolites    1000 Barbiturate and metabolites    200 Benzodiazepine                 200 Opiates and metabolites        300 Cocaine and metabolites        300 THC                            50 Performed at [facility], [address] [place], [place] [address]     Current Facility-Administered Medications  Medication Dose Route Frequency Provider Last Rate Last Admin   acetaminophen (TYLENOL) tablet 650 mg  650 mg Oral Q6H PRN Candelaria Stagers T, MD   650 mg at 04/29/23 2054   Or   acetaminophen (TYLENOL) suppository 650 mg  650 mg Rectal Q6H PRN Almon Hercules, MD       Chlorhexidine Gluconate Cloth 2 % PADS 6 each  6 each Topical Daily Almon Hercules, MD   6 each at 04/30/23 0851   divalproex (DEPAKOTE ER) 24 hr tablet 500 mg  500 mg Oral BID Candelaria Stagers T, MD   500 mg at 04/30/23 0850   enoxaparin (LOVENOX) injection 40 mg  40 mg Subcutaneous Q24H Gonfa, Taye T, MD   40 mg at 04/29/23 1500   feeding supplement (ENSURE ENLIVE / ENSURE PLUS) liquid 237 mL  237 mL Oral TID BM Gonfa, Taye T, MD   237 mL at 04/30/23 1426   fluticasone (FLONASE) 50 MCG/ACT nasal spray 2 spray  2 spray Each Nare Daily Candelaria Stagers T, MD   2 spray at 04/30/23 0845   fluticasone furoate-vilanterol (BREO ELLIPTA) 100-25  MCG/ACT 1 puff  1 puff Inhalation Daily Gonfa, Taye T, MD   1 puff at 04/30/23 0730   hydrOXYzine (ATARAX) tablet 25 mg  25 mg Oral TID PRN Candelaria Stagers T, MD   25 mg at 04/30/23 1140   ipratropium-albuterol (DUONEB) 0.5-2.5 (3) MG/3ML nebulizer solution 3 mL  3 mL Nebulization Q4H PRN Candelaria Stagers T, MD   3 mL at 04/29/23 1305   loratadine (CLARITIN) tablet 10 mg  10 mg Oral Daily Candelaria Stagers T, MD   10 mg at 04/30/23 0850   multivitamin with minerals tablet 1 tablet  1 tablet Oral Daily Candelaria Stagers T, MD   1 tablet at 04/30/23 0850   nicotine (NICODERM CQ - dosed in mg/24 hours) patch 21 mg  21 mg Transdermal Daily Gonfa, Taye T, MD       ondansetron (ZOFRAN) tablet 4 mg  4 mg Oral Q6H PRN Gonfa, Taye T, MD       Or   ondansetron (ZOFRAN) injection 4 mg  4 mg Intravenous Q6H PRN Gonfa, Taye T, MD       Oral care mouth rinse  15 mL Mouth Rinse 4 times per day Candelaria Stagers  T, MD   15 mL at 04/30/23 1140   Oral care mouth rinse  15 mL Mouth Rinse PRN Gonfa, Taye T, MD       PARoxetine (PAXIL) tablet 10 mg  10 mg Oral Daily Candelaria Stagers T, MD   10 mg at 04/30/23 0850   pneumococcal 20-valent conjugate vaccine (PREVNAR 20) injection 0.5 mL  0.5 mL Intramuscular Tomorrow-1000 Gonfa, Taye T, MD       risperiDONE (RISPERDAL M-TABS) disintegrating tablet 1 mg  1 mg Oral BID PRN Maryagnes Amos, FNP       risperiDONE (RISPERDAL) tablet 2 mg  2 mg Oral QHS Gonfa, Taye T, MD   2 mg at 04/29/23 2055   sodium chloride (OCEAN) 0.65 % nasal spray 1 spray  1 spray Each Nare PRN Almon Hercules, MD       traZODone (DESYREL) tablet 50 mg  50 mg Oral QHS PRN Candelaria Stagers T, MD   50 mg at 04/29/23 2054   umeclidinium bromide (INCRUSE ELLIPTA) 62.5 MCG/ACT 1 puff  1 puff Inhalation Daily Almon Hercules, MD   1 puff at 04/30/23 0729    Musculoskeletal: Strength & Muscle Tone: within normal limits Gait & Station: normal Patient leans: N/A            Psychiatric Specialty Exam:  Presentation  General  Appearance:  Disheveled  Eye Contact: Good  Speech: Clear and Coherent; Pressured  Speech Volume: Normal  Handedness: Right   Mood and Affect  Mood: Labile; Anxious; Irritable  Affect: Congruent; Depressed; Labile   Thought Process  Thought Processes: Coherent; Linear  Descriptions of Associations:Tangential  Orientation:Full (Time, Place and Person)  Thought Content:Logical  History of Schizophrenia/Schizoaffective disorder:No  Duration of Psychotic Symptoms:-- (unknown- could not be assessed due to pt's emotional state)  Hallucinations:Hallucinations: None  Ideas of Reference:None  Suicidal Thoughts:Suicidal Thoughts: No  Homicidal Thoughts:Homicidal Thoughts: No   Sensorium  Memory: Immediate Good; Recent Good; Remote Fair  Judgment: Fair  Insight: Good   Executive Functions  Concentration: Fair  Attention Span: Good  Recall: Good  Fund of Knowledge: Good  Language: Good   Psychomotor Activity  Psychomotor Activity: Psychomotor Activity: Normal   Assets  Assets: Communication Skills; Desire for Improvement; Social Support; Resilience; Leisure Time; Physical Health   Sleep  Sleep: Sleep: Fair   Physical Exam: Physical Exam ROS Blood pressure (!) 117/91, pulse 95, temperature 98.6 F (37 C), temperature source Oral, resp. rate 20, height 5\' 5"  (1.651 m), weight 46.8 kg, last menstrual period 10/13/2011, SpO2 90%. Body mass index is 17.17 kg/m.  TEODORA BASIC is 52yo female who presented with copd exacerbation in the setting of covid 61. The patient has a history of mania, psychosis, and tangentiality, which have historically occurred in the context of psychoactive substance use. She frequently accesses the ER, with her last inpatient psychiatric hospitalization in June 2024. Following discharge, the patient discontinued her prescribed medications and instead chose to self-medicate with cocaine.  To accurately  diagnose potential Bipolar Disorder 1 or Schizoaffective Disorder, the patient will require a psychiatric evaluation free from illicit substances for at least six months. Currently, her symptoms align more closely with substance-induced psychosis, particularly related to cocaine withdrawal and dependency. She is currently labile and agitated and may benefit from PRN Risperdal and a scheduled nightly dose. However, given her history of medication noncompliance and ongoing substance use, it is unlikely she will continue to take Risperdal post-discharge.     Treatment  Plan Summary: Plan Will start Risperdal M tab 1.0 po BID prn.   Will continue Risperdal 2 mg p.o. nightly for stabilization and agitation; discontinue at discharge.  The patient has a history of medication noncompliance and is unlikely to take it consistently. She is not ready to quit using cocaine, which diminishes the potential effectiveness of the medication. Her condition is chronic and will not be resolved through short-term medication use. Long-term management, including the potential use of a long-acting depot, is more appropriate. Given these factors, discharging the patient with Risperdal is not recommended, as it is unlikely to contribute to sustained improvement or adherence in her current state.  -Will recommend continuing hydroxyzine 25 mg 3 times daily as needed, trazodone 50 mg p.o. nightly as needed.   -Will discontinue Depakote 500 p.o. twice daily, patient has been noncompliant with this medication historically and will not be beneficial to start as the risk for her stopping the medication after discharge remains high.   -Will also recommend discontinuing paroxetine 10 mg p.o. daily, same reason noted above. -Patient currently does not meet criteria for [facility] involuntary commitment, however if this were to change please reach out to our team and or overnight provider. -TOC referral for outpatient psychiatric clinic.   Patient is a great candidate for [facility]  Will obtain EKG, previous EKG slightly abnormal secondary to severe hypokalemia (2.5).  EKG obtained on today's date, QTc (437).  Additional labs obtained include abnormal urine drug screen positive for cocaine and THC.  Psych consult service to sign off at this time.  Disposition: No evidence of imminent risk to self or others at present.   Patient does not meet criteria for psychiatric inpatient admission. Supportive therapy provided about ongoing stressors. Refer to IOP. Discussed crisis plan, support from social network, calling 911, coming to the Emergency Department, and calling Suicide Hotline.  Maryagnes Amos, FNP 04/30/2023 3:53 PM

## 2023-05-01 ENCOUNTER — Inpatient Hospital Stay (HOSPITAL_COMMUNITY): Payer: Medicaid Other

## 2023-05-01 DIAGNOSIS — F319 Bipolar disorder, unspecified: Secondary | ICD-10-CM

## 2023-05-01 DIAGNOSIS — U071 COVID-19: Secondary | ICD-10-CM

## 2023-05-01 DIAGNOSIS — J9601 Acute respiratory failure with hypoxia: Secondary | ICD-10-CM

## 2023-05-01 DIAGNOSIS — E43 Unspecified severe protein-calorie malnutrition: Secondary | ICD-10-CM

## 2023-05-01 DIAGNOSIS — J9602 Acute respiratory failure with hypercapnia: Secondary | ICD-10-CM

## 2023-05-01 DIAGNOSIS — F411 Generalized anxiety disorder: Secondary | ICD-10-CM

## 2023-05-01 DIAGNOSIS — J441 Chronic obstructive pulmonary disease with (acute) exacerbation: Secondary | ICD-10-CM

## 2023-05-01 DIAGNOSIS — Z87898 Personal history of other specified conditions: Secondary | ICD-10-CM

## 2023-05-01 LAB — CBC WITH DIFFERENTIAL/PLATELET
Abs Immature Granulocytes: 0.02 10*3/uL (ref 0.00–0.07)
Basophils Absolute: 0 10*3/uL (ref 0.0–0.1)
Basophils Relative: 0 %
Eosinophils Absolute: 0 10*3/uL (ref 0.0–0.5)
Eosinophils Relative: 0 %
HCT: 48.1 % — ABNORMAL HIGH (ref 36.0–46.0)
Hemoglobin: 15.9 g/dL — ABNORMAL HIGH (ref 12.0–15.0)
Immature Granulocytes: 0 %
Lymphocytes Relative: 38 %
Lymphs Abs: 2.6 10*3/uL (ref 0.7–4.0)
MCH: 31.5 pg (ref 26.0–34.0)
MCHC: 33.1 g/dL (ref 30.0–36.0)
MCV: 95.4 fL (ref 80.0–100.0)
Monocytes Absolute: 0.7 10*3/uL (ref 0.1–1.0)
Monocytes Relative: 10 %
Neutro Abs: 3.5 10*3/uL (ref 1.7–7.7)
Neutrophils Relative %: 52 %
Platelets: 264 10*3/uL (ref 150–400)
RBC: 5.04 MIL/uL (ref 3.87–5.11)
RDW: 13.5 % (ref 11.5–15.5)
WBC: 6.9 10*3/uL (ref 4.0–10.5)
nRBC: 0 % (ref 0.0–0.2)

## 2023-05-01 LAB — COMPREHENSIVE METABOLIC PANEL
ALT: 18 U/L (ref 0–44)
AST: 20 U/L (ref 15–41)
Albumin: 3.2 g/dL — ABNORMAL LOW (ref 3.5–5.0)
Alkaline Phosphatase: 72 U/L (ref 38–126)
Anion gap: 9 (ref 5–15)
BUN: 23 mg/dL — ABNORMAL HIGH (ref 6–20)
CO2: 28 mmol/L (ref 22–32)
Calcium: 8.8 mg/dL — ABNORMAL LOW (ref 8.9–10.3)
Chloride: 101 mmol/L (ref 98–111)
Creatinine, Ser: 0.51 mg/dL (ref 0.44–1.00)
GFR, Estimated: >60 mL/min (ref >60)
Glucose, Bld: 113 mg/dL — ABNORMAL HIGH (ref 70–99)
Potassium: 3.7 mmol/L (ref 3.5–5.1)
Sodium: 138 mmol/L (ref 135–145)
Total Bilirubin: 0.6 mg/dL (ref 0.3–1.2)
Total Protein: 6.4 g/dL — ABNORMAL LOW (ref 6.5–8.1)

## 2023-05-01 LAB — PHOSPHORUS: Phosphorus: 3.8 mg/dL (ref 2.5–4.6)

## 2023-05-01 LAB — MAGNESIUM: Magnesium: 2.2 mg/dL (ref 1.7–2.4)

## 2023-05-01 MED ORDER — METHYLPREDNISOLONE SODIUM SUCC 125 MG IJ SOLR
60.0000 mg | Freq: Two times a day (BID) | INTRAMUSCULAR | Status: DC
  Administered 2023-05-01 – 2023-05-03 (×5): 60 mg via INTRAVENOUS
  Filled 2023-05-01 (×5): qty 2

## 2023-05-01 MED ORDER — GUAIFENESIN ER 600 MG PO TB12
1200.0000 mg | ORAL_TABLET | Freq: Two times a day (BID) | ORAL | Status: DC
  Administered 2023-05-01 – 2023-05-03 (×4): 1200 mg via ORAL
  Filled 2023-05-01 (×4): qty 2
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
## 2023-05-01 NOTE — Plan of Care (Signed)
  Problem: Education: Goal: Knowledge of risk factors and measures for prevention of condition will improve Outcome: Progressing   Problem: Coping: Goal: Psychosocial and spiritual needs will be supported Outcome: Progressing   Problem: Education: Goal: Knowledge of General Education information will improve Description: Including pain rating scale, medication(s)/side effects and non-pharmacologic comfort measures Outcome: Progressing   
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
## 2023-05-01 NOTE — Plan of Care (Signed)
  Problem: Education: Goal: Knowledge of risk factors and measures for prevention of condition will improve Outcome: Progressing   Problem: Coping: Goal: Psychosocial and spiritual needs will be supported Outcome: Progressing   Problem: Respiratory: Goal: Will maintain a patent airway Outcome: Progressing   

## 2023-05-01 NOTE — Plan of Care (Signed)
  Problem: Coping: Goal: Psychosocial and spiritual needs will be supported Outcome: Progressing   Problem: Coping: Goal: Level of anxiety will decrease Outcome: Progressing

## 2023-05-02 ENCOUNTER — Inpatient Hospital Stay (HOSPITAL_COMMUNITY): Payer: Medicaid Other

## 2023-05-02 LAB — CBC WITH DIFFERENTIAL/PLATELET
Abs Immature Granulocytes: 0.02 10*3/uL (ref 0.00–0.07)
Basophils Absolute: 0 10*3/uL (ref 0.0–0.1)
Basophils Relative: 0 %
Eosinophils Absolute: 0 10*3/uL (ref 0.0–0.5)
Eosinophils Relative: 0 %
HCT: 50.9 % — ABNORMAL HIGH (ref 36.0–46.0)
Hemoglobin: 16.7 g/dL — ABNORMAL HIGH (ref 12.0–15.0)
Immature Granulocytes: 0 %
Lymphocytes Relative: 14 %
Lymphs Abs: 1 10*3/uL (ref 0.7–4.0)
MCH: 31.6 pg (ref 26.0–34.0)
MCHC: 32.8 g/dL (ref 30.0–36.0)
MCV: 96.2 fL (ref 80.0–100.0)
Monocytes Absolute: 0.3 10*3/uL (ref 0.1–1.0)
Monocytes Relative: 4 %
Neutro Abs: 5.8 10*3/uL (ref 1.7–7.7)
Neutrophils Relative %: 82 %
Platelets: 315 10*3/uL (ref 150–400)
RBC: 5.29 MIL/uL — ABNORMAL HIGH (ref 3.87–5.11)
RDW: 13.8 % (ref 11.5–15.5)
WBC: 7 10*3/uL (ref 4.0–10.5)
nRBC: 0 % (ref 0.0–0.2)

## 2023-05-02 LAB — COMPREHENSIVE METABOLIC PANEL
ALT: 40 U/L (ref 0–44)
AST: 34 U/L (ref 15–41)
Albumin: 3.3 g/dL — ABNORMAL LOW (ref 3.5–5.0)
Alkaline Phosphatase: 83 U/L (ref 38–126)
Anion gap: 10 (ref 5–15)
BUN: 27 mg/dL — ABNORMAL HIGH (ref 6–20)
CO2: 27 mmol/L (ref 22–32)
Calcium: 8.7 mg/dL — ABNORMAL LOW (ref 8.9–10.3)
Chloride: 99 mmol/L (ref 98–111)
Creatinine, Ser: 0.68 mg/dL (ref 0.44–1.00)
GFR, Estimated: >60 mL/min (ref >60)
Glucose, Bld: 131 mg/dL — ABNORMAL HIGH (ref 70–99)
Potassium: 4.7 mmol/L (ref 3.5–5.1)
Sodium: 136 mmol/L (ref 135–145)
Total Bilirubin: 0.4 mg/dL (ref 0.3–1.2)
Total Protein: 6.5 g/dL (ref 6.5–8.1)

## 2023-05-02 LAB — PROCALCITONIN: Procalcitonin: <0.1 ng/mL

## 2023-05-02 LAB — LACTIC ACID, PLASMA: Lactic Acid, Venous: 1.4 mmol/L (ref 0.5–1.9)

## 2023-05-02 LAB — LACTATE DEHYDROGENASE: LDH: 116 U/L (ref 98–192)

## 2023-05-02 LAB — FIBRINOGEN: Fibrinogen: 431 mg/dL (ref 210–475)

## 2023-05-02 LAB — C-REACTIVE PROTEIN: CRP: 0.8 mg/dL (ref <1.0)

## 2023-05-02 LAB — D-DIMER, QUANTITATIVE: D-Dimer, Quant: <0.27 ug{FEU}/mL (ref 0.00–0.50)

## 2023-05-02 LAB — PHOSPHORUS: Phosphorus: 4.4 mg/dL (ref 2.5–4.6)

## 2023-05-02 LAB — FERRITIN: Ferritin: 30 ng/mL (ref 11–307)

## 2023-05-02 LAB — SEDIMENTATION RATE: Sed Rate: 1 mm/h (ref 0–22)

## 2023-05-02 LAB — MAGNESIUM: Magnesium: 2.4 mg/dL (ref 1.7–2.4)

## 2023-05-02 MED ORDER — ALUM & MAG HYDROXIDE-SIMETH 200-200-20 MG/5ML PO SUSP
30.0000 mL | Freq: Four times a day (QID) | ORAL | Status: DC | PRN
  Administered 2023-05-02 – 2023-05-03 (×3): 30 mL via ORAL
  Filled 2023-05-02 (×3): qty 30

## 2023-05-02 MED ORDER — AMOXICILLIN-POT CLAVULANATE 875-125 MG PO TABS
1.0000 | ORAL_TABLET | Freq: Two times a day (BID) | ORAL | Status: DC
  Administered 2023-05-02 – 2023-05-03 (×2): 1 via ORAL
  Filled 2023-05-02 (×2): qty 1

## 2023-05-02 MED ORDER — LORAZEPAM 0.5 MG PO TABS
0.5000 mg | ORAL_TABLET | Freq: Two times a day (BID) | ORAL | Status: DC | PRN
  Administered 2023-05-02 – 2023-05-03 (×2): 0.5 mg via ORAL
  Filled 2023-05-02 (×2): qty 1

## 2023-05-02 NOTE — Plan of Care (Signed)
  Problem: Education: Goal: Knowledge of risk factors and measures for prevention of condition will improve Outcome: Progressing   Problem: Health Behavior/Discharge Planning: Goal: Ability to manage health-related needs will improve Outcome: Progressing

## 2023-05-02 NOTE — Plan of Care (Signed)
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
## 2023-05-03 ENCOUNTER — Inpatient Hospital Stay (HOSPITAL_COMMUNITY): Payer: Medicaid Other

## 2023-05-03 LAB — COMPREHENSIVE METABOLIC PANEL
ALT: 31 U/L (ref 0–44)
AST: 19 U/L (ref 15–41)
Albumin: 3.4 g/dL — ABNORMAL LOW (ref 3.5–5.0)
Alkaline Phosphatase: 85 U/L (ref 38–126)
Anion gap: 13 (ref 5–15)
BUN: 21 mg/dL — ABNORMAL HIGH (ref 6–20)
CO2: 29 mmol/L (ref 22–32)
Calcium: 9.3 mg/dL (ref 8.9–10.3)
Chloride: 99 mmol/L (ref 98–111)
Creatinine, Ser: 0.5 mg/dL (ref 0.44–1.00)
GFR, Estimated: >60 mL/min (ref >60)
Glucose, Bld: 119 mg/dL — ABNORMAL HIGH (ref 70–99)
Potassium: 4.4 mmol/L (ref 3.5–5.1)
Sodium: 141 mmol/L (ref 135–145)
Total Bilirubin: 0.5 mg/dL (ref 0.3–1.2)
Total Protein: 6.6 g/dL (ref 6.5–8.1)

## 2023-05-03 LAB — CBC WITH DIFFERENTIAL/PLATELET
Abs Immature Granulocytes: 0.11 10*3/uL — ABNORMAL HIGH (ref 0.00–0.07)
Basophils Absolute: 0 10*3/uL (ref 0.0–0.1)
Basophils Relative: 0 %
Eosinophils Absolute: 0 10*3/uL (ref 0.0–0.5)
Eosinophils Relative: 0 %
HCT: 48.8 % — ABNORMAL HIGH (ref 36.0–46.0)
Hemoglobin: 16.1 g/dL — ABNORMAL HIGH (ref 12.0–15.0)
Immature Granulocytes: 1 %
Lymphocytes Relative: 12 %
Lymphs Abs: 1.7 10*3/uL (ref 0.7–4.0)
MCH: 31.9 pg (ref 26.0–34.0)
MCHC: 33 g/dL (ref 30.0–36.0)
MCV: 96.6 fL (ref 80.0–100.0)
Monocytes Absolute: 0.9 10*3/uL (ref 0.1–1.0)
Monocytes Relative: 7 %
Neutro Abs: 11.2 10*3/uL — ABNORMAL HIGH (ref 1.7–7.7)
Neutrophils Relative %: 80 %
Platelets: 357 10*3/uL (ref 150–400)
RBC: 5.05 MIL/uL (ref 3.87–5.11)
RDW: 13.6 % (ref 11.5–15.5)
WBC: 13.9 10*3/uL — ABNORMAL HIGH (ref 4.0–10.5)
nRBC: 0 % (ref 0.0–0.2)

## 2023-05-03 LAB — MAGNESIUM: Magnesium: 2.4 mg/dL (ref 1.7–2.4)

## 2023-05-03 LAB — PHOSPHORUS: Phosphorus: 4.4 mg/dL (ref 2.5–4.6)

## 2023-05-03 MED ORDER — DIVALPROEX SODIUM ER 500 MG PO TB24
500.0000 mg | ORAL_TABLET | Freq: Two times a day (BID) | ORAL | 1 refills | Status: DC
  Filled 2023-05-03: qty 60, 30d supply, fill #0

## 2023-05-03 MED ORDER — ADULT MULTIVITAMIN W/MINERALS CH: 1.0000 | ORAL_TABLET | Freq: Every day | ORAL | Status: DC

## 2023-05-03 MED ORDER — ENSURE ENLIVE PO LIQD
237.0000 mL | Freq: Three times a day (TID) | ORAL | 12 refills | Status: DC
  Filled 2023-05-03: qty 237, 1d supply, fill #0

## 2023-05-03 MED ORDER — SALINE SPRAY 0.65 % NA SOLN
1.0000 | NASAL | 0 refills | Status: DC | PRN
  Filled 2023-05-03: qty 132, fill #0

## 2023-05-03 MED ORDER — PREDNISONE 10 MG (21) PO TBPK
ORAL_TABLET | ORAL | 0 refills | Status: DC
  Filled 2023-05-03: qty 21, 6d supply, fill #0

## 2023-05-03 MED ORDER — ORAL CARE MOUTH RINSE
15.0000 mL | Freq: Four times a day (QID) | OROMUCOSAL | 0 refills | Status: DC
  Filled 2023-05-03: qty 354, 6d supply, fill #0

## 2023-05-03 MED ORDER — ALBUTEROL SULFATE HFA 108 (90 BASE) MCG/ACT IN AERS
2.0000 | INHALATION_SPRAY | Freq: Four times a day (QID) | RESPIRATORY_TRACT | 1 refills | Status: DC | PRN
  Filled 2023-05-03: qty 6.7, 30d supply, fill #0

## 2023-05-03 MED ORDER — FLUOXETINE HCL 20 MG PO CAPS
20.0000 mg | ORAL_CAPSULE | Freq: Every day | ORAL | 2 refills | Status: DC
  Filled 2023-05-03: qty 30, 30d supply, fill #0

## 2023-05-03 MED ORDER — NICOTINE 21 MG/24HR TD PT24
21.0000 mg | MEDICATED_PATCH | Freq: Every day | TRANSDERMAL | 0 refills | Status: DC
  Filled 2023-05-03: qty 14, 14d supply, fill #0

## 2023-05-03 MED ORDER — GUAIFENESIN ER 600 MG PO TB12
600.0000 mg | ORAL_TABLET | Freq: Two times a day (BID) | ORAL | 0 refills | Status: AC
  Filled 2023-05-03: qty 10, 5d supply, fill #0

## 2023-05-03 MED ORDER — CETIRIZINE HCL 10 MG PO TABS: 10.0000 mg | ORAL_TABLET | Freq: Every day | ORAL | Status: DC

## 2023-05-03 MED ORDER — NICOTINE POLACRILEX 2 MG MT GUM
2.0000 mg | CHEWING_GUM | OROMUCOSAL | 0 refills | Status: DC | PRN
  Filled 2023-05-03: qty 50, 10d supply, fill #0

## 2023-05-03 MED ORDER — TRAZODONE HCL 50 MG PO TABS
50.0000 mg | ORAL_TABLET | Freq: Every evening | ORAL | 1 refills | Status: DC | PRN
  Filled 2023-05-03: qty 30, 30d supply, fill #0

## 2023-05-03 MED ORDER — RISPERIDONE 2 MG PO TABS
2.0000 mg | ORAL_TABLET | Freq: Every day | ORAL | 1 refills | Status: DC
  Filled 2023-05-03: qty 30, 30d supply, fill #0

## 2023-05-03 MED ORDER — ONDANSETRON HCL 4 MG PO TABS
4.0000 mg | ORAL_TABLET | Freq: Four times a day (QID) | ORAL | 0 refills | Status: DC | PRN
  Filled 2023-05-03: qty 20, 5d supply, fill #0

## 2023-05-03 MED ORDER — FLUTICASONE PROPIONATE 50 MCG/ACT NA SUSP
2.0000 | Freq: Every day | NASAL | 2 refills | Status: DC
  Filled 2023-05-03: qty 16, 30d supply, fill #0

## 2023-05-03 MED ORDER — AMOXICILLIN-POT CLAVULANATE 875-125 MG PO TABS
1.0000 | ORAL_TABLET | Freq: Two times a day (BID) | ORAL | 0 refills | Status: AC
  Filled 2023-05-03: qty 12, 6d supply, fill #0

## 2023-05-03 MED ORDER — FLUTICASONE FUROATE-VILANTEROL 200-25 MCG/ACT IN AEPB
1.0000 | INHALATION_SPRAY | Freq: Every day | RESPIRATORY_TRACT | 1 refills | Status: AC
  Filled 2023-05-03: qty 60, 60d supply, fill #0

## 2023-05-03 MED ORDER — HYDROXYZINE HCL 25 MG PO TABS
25.0000 mg | ORAL_TABLET | Freq: Three times a day (TID) | ORAL | 0 refills | Status: AC | PRN
  Filled 2023-05-03: qty 90, 30d supply, fill #0

## 2023-05-03 NOTE — Progress Notes (Signed)
Nutrition Follow-up  DOCUMENTATION CODES:   Severe malnutrition in context of social or environmental circumstances, Underweight  INTERVENTION:  - Regular diet.  - Ensure Plus High Protein po TID, each supplement provides 350 kcal and 20 grams of protein.  - Magic cup BID with meals, each supplement provides 290 kcal and 9 grams of protein  - Multivitamin with minerals daily - Monitor weight trends.  NUTRITION DIAGNOSIS:   Severe Malnutrition related to social / environmental circumstances as evidenced by severe muscle depletion, moderate fat depletion, percent weight loss. *ongoing  GOAL:   Patient will meet greater than or equal to 90% of their needs *met  MONITOR:   PO intake, Supplement acceptance, Labs, Weight trends, I & O's  REASON FOR ASSESSMENT:   Consult Assessment of nutrition requirement/status  ASSESSMENT:   52 year old F with PMH of COPD/asthma, bipolar disorder, seizure disorder, anxiety, homelessness and polysubstance use including tobacco, alcohol, marijuana and cocaine, and noncompliance brought to ED by EMS due to runny nose, sore throat, cough, progressive SOB and hypoxemia to 70%, and admitted for acute respiratory failure and COPD exacerbation in the setting of COVID-19 infection.  Patient consuming 100% of all meals. Continues to order upwards of 2 trays per meal. Also drinking Ensure three times daily. No weight since last follow up to assess recent changes, will order.  Medications reviewed and include: MVI  Labs reviewed:  -   Diet Order:   Diet Order             Diet regular Room service appropriate? Yes; Fluid consistency: Thin  Diet effective now                   EDUCATION NEEDS:  No education needs have been identified at this time  Skin:  Skin Assessment: Reviewed RN Assessment  Last BM:  9/1  Height:  Ht Readings from Last 1 Encounters:  04/29/23 5\' 5"  (1.651 m)   Weight:  Wt Readings from Last 1 Encounters:   04/29/23 46.8 kg    BMI:  Body mass index is 17.17 kg/m.  Estimated Nutritional Needs:  Kcal:  1700-1900 Protein:  85-100g Fluid:  1.9L/day    Shelle Iron RD, LDN For contact information, refer to [facility].

## 2023-05-03 NOTE — TOC Progression Note (Signed)
Transition of Care [facility]) - Progression Note   Patient Details  Name: Stacy Bolton MRN: [id number] Date of Birth: 1970-12-17  [facility] CM/SW Contact  Ewing Schlein, LCSW Phone Number: 05/03/2023, 11:40 AM  Clinical Narrative: CSW spoke with patient regarding discharge plan. Patient reported she is homeless and does not have a "permanent address." CSW explained TOC is only able to send the patient to the [facility] at discharge unless patient can find another place before discharge. Patient asked if she can get transportation to a local address if a friend will agree to let her come to their home. CSW explained if patient can find a local address, TOC can be notified and a transportation plan can be determined at that time. Patient to call friends for possible placement.  Expected Discharge Plan: Homeless Shelter [facility]) Barriers to Discharge: Continued Medical Work up  Expected Discharge Plan and Services In-house Referral: Clinical Social Work Living arrangements for the past 2 months: Homeless           DME Arranged: N/A DME Agency: NA  Social Determinants of Health (SDOH) Interventions SDOH Screenings   Food Insecurity: Food Insecurity Present (04/28/2023)  Housing: High Risk (04/28/2023)  Transportation Needs: Unmet Transportation Needs (04/28/2023)  Utilities: At Risk (04/28/2023)  Alcohol Screen: Low Risk  (01/31/2023)  Financial Resource Strain: High Risk (01/16/2023)   Received from [facility], [facility]  Physical Activity: Sufficiently Active (01/16/2023)   Received from [facility], [facility]  Social Connections: Socially Isolated (01/16/2023)   Received from [facility], [facility]  Tobacco Use: High Risk (04/28/2023)  Health Literacy: Low Risk  (01/16/2023)   Received from [facility], [facility]   Readmission Risk Interventions    04/30/2023   11:24 AM  Readmission Risk Prevention Plan  Transportation Screening Complete  HRI  or Home Care Consult Complete  SW Recovery Care/Counseling Consult Complete  Palliative Care Screening Not Applicable  Skilled Nursing Facility Not Applicable

## 2023-05-03 NOTE — Progress Notes (Signed)
PHYSICAL THERAPY  Attempted twice to see today.  Both times pt declined due to multiple reasons of headache, indigestion, tired, feeling "bad".  NT reported "she don't let me do anything for her".  Reported pt is amb to and from bathroom without assist and around room.    Felecia Shelling  PTA Acute  Rehabilitation Services Office M-F          [phone]

## 2023-05-03 NOTE — Plan of Care (Signed)

## 2023-05-03 NOTE — Plan of Care (Signed)

## 2023-05-03 NOTE — Progress Notes (Signed)
Patient's ride got cancelled and she needed a voucher for  the cab. Voucher was provided and patient was discharged.
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
## 2023-05-04 ENCOUNTER — Other Ambulatory Visit (HOSPITAL_COMMUNITY): Payer: Self-pay

## 2023-05-06 ENCOUNTER — Other Ambulatory Visit (HOSPITAL_COMMUNITY): Payer: Self-pay

## 2023-05-07 ENCOUNTER — Other Ambulatory Visit (HOSPITAL_COMMUNITY): Payer: Self-pay

## 2023-05-08 ENCOUNTER — Other Ambulatory Visit (HOSPITAL_COMMUNITY): Payer: Self-pay

## 2023-05-09 ENCOUNTER — Other Ambulatory Visit (HOSPITAL_COMMUNITY): Payer: Self-pay

## 2023-05-09 ENCOUNTER — Other Ambulatory Visit: Payer: Self-pay

## 2023-05-14 ENCOUNTER — Other Ambulatory Visit (HOSPITAL_COMMUNITY): Payer: Self-pay

## 2023-05-20 ENCOUNTER — Other Ambulatory Visit (HOSPITAL_COMMUNITY): Payer: Self-pay

## 2023-07-13 ENCOUNTER — Emergency Department (HOSPITAL_COMMUNITY): Payer: Self-pay

## 2023-07-13 ENCOUNTER — Encounter (HOSPITAL_COMMUNITY): Payer: Self-pay | Admitting: Emergency Medicine

## 2023-07-13 ENCOUNTER — Emergency Department (HOSPITAL_COMMUNITY)
Admission: EM | Admit: 2023-07-13 | Discharge: 2023-07-13 | Disposition: A | Discharge status: Home / Self Care | Payer: Self-pay | Attending: Emergency Medicine | Admitting: Emergency Medicine

## 2023-07-13 ENCOUNTER — Other Ambulatory Visit: Payer: Self-pay

## 2023-07-13 DIAGNOSIS — J441 Chronic obstructive pulmonary disease with (acute) exacerbation: Secondary | ICD-10-CM | POA: Diagnosis not present

## 2023-07-13 DIAGNOSIS — R0602 Shortness of breath: Secondary | ICD-10-CM | POA: Diagnosis present

## 2023-07-13 MED ORDER — ALBUTEROL SULFATE HFA 108 (90 BASE) MCG/ACT IN AERS
2.0000 | INHALATION_SPRAY | RESPIRATORY_TRACT | Status: DC
  Administered 2023-07-13: 2 via RESPIRATORY_TRACT
  Filled 2023-07-13: qty 6.7

## 2023-07-13 MED ORDER — LORAZEPAM 1 MG PO TABS
1.0000 mg | ORAL_TABLET | Freq: Once | ORAL | Status: AC
  Administered 2023-07-13: 1 mg via ORAL
  Filled 2023-07-13: qty 1

## 2023-07-13 MED ORDER — PREDNISONE 50 MG PO TABS: ORAL_TABLET | ORAL | 0 refills | Status: DC

## 2023-07-13 MED ORDER — ALBUTEROL SULFATE (2.5 MG/3ML) 0.083% IN NEBU
10.0000 mg/h | INHALATION_SOLUTION | Freq: Once | RESPIRATORY_TRACT | Status: AC
  Administered 2023-07-13: 10 mg/h via RESPIRATORY_TRACT
  Filled 2023-07-13: qty 12

## 2023-07-13 NOTE — ED Provider Notes (Signed)
[place] EMERGENCY DEPARTMENT AT [facility] Provider Note   CSN: [id number] Arrival date & time: 07/13/23  0750     History  Chief Complaint  Patient presents with   Shortness of Breath    52 y/o female comes in by EMS c/o sob for the past 6 months, but reports her symptoms have been getting worse over the past few days and reports when she woke up " it was worse." Pt has a hx of COPD a non-productive cough, chills and chest pain. Pt was given 125mg  of Solumedrol, I Neb treatment and "1mL" of Atrovent      JENEVIE RUTHVEN is a 52 y.o. female.  52 year old female with history of COPD presents short of breath.  Patient states she continues to smoke a great deal.  Ran out of her inhaler.  No recent fever, vomiting, diarrhea.  No anginal or CHF symptoms.  When she awoke she had wheezing.  She is currently homeless.  EMS was called and patient given albuterol along with Atrovent.  She was also given Solu-Medrol 125 IV push.  Transported here       Home Medications Prior to Admission medications   Medication Sig Start Date End Date Taking? Authorizing Provider  albuterol (PROVENTIL HFA) 108 (90 Base) MCG/ACT inhaler Inhale 2 puffs into the lungs every 6 (six) hours as needed for wheezing or shortness of breath. 05/03/23   Marguerita Merles Latif, DO  cetirizine (ZYRTEC ALLERGY) 10 MG tablet Take 1 tablet (10 mg total) by mouth daily for 10 days. 05/03/23 05/13/23  Marguerita Merles Latif, DO  divalproex (DEPAKOTE ER) 500 MG 24 hr tablet Take 1 tablet (500 mg total) by mouth 2 (two) times daily. 05/03/23   Marguerita Merles Latif, DO  feeding supplement (ENSURE ENLIVE / ENSURE PLUS) LIQD Take 237 mLs by mouth 3 (three) times daily between meals. 05/03/23   Marguerita Merles Latif, DO  FLUoxetine (PROZAC) 20 MG capsule Take 1 capsule (20 mg total) by mouth daily. 05/03/23 08/01/23  Marguerita Merles Latif, DO  fluticasone (FLONASE) 50 MCG/ACT nasal spray Place 2 sprays into both nostrils daily. 05/03/23    Marguerita Merles Latif, DO  magnesium citrate SOLN Take 296 mLs (1 Bottle total) by mouth daily as needed for severe constipation. 02/08/23   Clapacs, Jackquline Denmark, MD  Mouthwashes (MOUTH RINSE) LIQD solution 15 mLs by Mouth Rinse route 4 (four) times daily. 05/03/23   Marguerita Merles Latif, DO  Multiple Vitamin (MULTIVITAMIN WITH MINERALS) TABS tablet Take 1 tablet by mouth daily. 05/03/23   Sheikh, Omair Latif, DO  nicotine (NICODERM CQ - DOSED IN MG/24 HOURS) 21 mg/24hr patch Place 1 patch (21 mg total) onto the skin daily. 05/03/23   Marguerita Merles Latif, DO  nicotine polacrilex (NICORETTE) 2 MG gum Take 1 each (2 mg total) by mouth as needed for smoking cessation. 05/03/23   Sheikh, Omair Latif, DO  ondansetron (ZOFRAN) 4 MG tablet Take 1 tablet (4 mg total) by mouth every 6 (six) hours as needed for nausea. 05/03/23   Sheikh, Omair Latif, DO  predniSONE (STERAPRED UNI-PAK 21 TAB) 10 MG (21) TBPK tablet Take 6 tablets on day 1, 5 tablets on day 2, 4 tablets on day 3, 3 tablets on day 4, 2 tabs on day 5, 1 tablet on day 6 and then stop on the seventh 05/03/23   Marguerita Merles Latif, DO  psyllium (HYDROCIL/METAMUCIL) 95 % PACK Take 1 packet by mouth daily. 02/09/23   Clapacs, Jackquline Denmark,  MD  risperiDONE (RISPERDAL) 2 MG tablet Take 1 tablet (2 mg total) by mouth at bedtime. 05/03/23   Marguerita Merles Latif, DO  sodium chloride (OCEAN) 0.65 % SOLN nasal spray Place 1 spray into both nostrils as needed for congestion (nose irritation). 05/03/23   Marguerita Merles Latif, DO  traZODone (DESYREL) 50 MG tablet Take 1 tablet (50 mg total) by mouth at bedtime as needed for sleep. 05/03/23   Marguerita Merles Latif, DO      Allergies    Hydrocodone-acetaminophen, Hydromorphone hcl, and Oxycodone-acetaminophen    Review of Systems   Review of Systems  All other systems reviewed and are negative.   Physical Exam Updated Vital Signs LMP 10/13/2011  Physical Exam Vitals and nursing note reviewed.  Constitutional:      General: She is not in  acute distress.    Appearance: Normal appearance. She is well-developed. She is not toxic-appearing.  HENT:     Head: Normocephalic and atraumatic.  Eyes:     General: Lids are normal.     Conjunctiva/sclera: Conjunctivae normal.     Pupils: Pupils are equal, round, and reactive to light.  Neck:     Thyroid: No thyroid mass.     Trachea: No tracheal deviation.  Cardiovascular:     Rate and Rhythm: Normal rate and regular rhythm.     Heart sounds: Normal heart sounds. No murmur heard.    No gallop.  Pulmonary:     Effort: Pulmonary effort is normal. No respiratory distress.     Breath sounds: No stridor. Examination of the right-upper field reveals decreased breath sounds and wheezing. Examination of the left-upper field reveals decreased breath sounds and wheezing. Decreased breath sounds and wheezing present. No rhonchi or rales.  Abdominal:     General: There is no distension.     Palpations: Abdomen is soft.     Tenderness: There is no abdominal tenderness. There is no rebound.  Musculoskeletal:        General: No tenderness. Normal range of motion.     Cervical back: Normal range of motion and neck supple.  Skin:    General: Skin is warm and dry.     Findings: No abrasion or rash.  Neurological:     Mental Status: She is alert and oriented to person, place, and time. Mental status is at baseline.     GCS: GCS eye subscore is 4. GCS verbal subscore is 5. GCS motor subscore is 6.     Cranial Nerves: Cranial nerves are intact. No cranial nerve deficit.     Sensory: No sensory deficit.     Motor: Motor function is intact.  Psychiatric:        Attention and Perception: Attention normal.        Speech: Speech normal.        Behavior: Behavior normal.     ED Results / Procedures / Treatments   Labs (all labs ordered are listed, but only abnormal results are displayed) Labs Reviewed - No data to display  EKG None  Radiology No results found.  Procedures Procedures     Medications Ordered in ED Medications  albuterol (PROVENTIL,VENTOLIN) solution continuous neb (has no administration in time range)    ED Course/ Medical Decision Making/ A&P                                 Medical Decision Making Amount and/or Complexity  of Data Reviewed Radiology: ordered.  Risk Prescription drug management.   Patient given a 10 mg albuterol continuous treatment here.  Chest x-ray was negative.  Lung exam is greatly improved.  Received Cymetra prior to arrival here.  Will place on his own and give albuterol inhaler to go home with        Final Clinical Impression(s) / ED Diagnoses Final diagnoses:  None    Rx / DC Orders ED Discharge Orders     None         Lorre Nick, MD 07/13/23 1203

## 2023-07-13 NOTE — Progress Notes (Signed)
CSW received message pt needing housing resources. CSW has attached shelter resources to AVS. TOC sign off.  Valentina Shaggy.Iverson Sees, MSW, LCSWA [facility]  Transitions of Care Clinical Social Worker I Direct Dial: [phone]  Fax: [phone] [email or web address]

## 2023-07-26 ENCOUNTER — Other Ambulatory Visit (HOSPITAL_COMMUNITY): Payer: Self-pay

## 2023-07-29 ENCOUNTER — Encounter: Payer: Self-pay | Admitting: *Deleted

## 2023-07-29 ENCOUNTER — Emergency Department (HOSPITAL_COMMUNITY): Payer: Self-pay

## 2023-07-29 ENCOUNTER — Other Ambulatory Visit: Payer: Self-pay

## 2023-07-29 ENCOUNTER — Encounter (HOSPITAL_COMMUNITY): Payer: Self-pay

## 2023-07-29 ENCOUNTER — Emergency Department (HOSPITAL_COMMUNITY)
Admission: EM | Admit: 2023-07-29 | Discharge: 2023-07-30 | Disposition: A | Discharge status: Home / Self Care | Payer: Self-pay | Attending: Emergency Medicine | Admitting: Emergency Medicine

## 2023-07-29 DIAGNOSIS — S52502A Unspecified fracture of the lower end of left radius, initial encounter for closed fracture: Secondary | ICD-10-CM | POA: Insufficient documentation

## 2023-07-29 DIAGNOSIS — M25532 Pain in left wrist: Secondary | ICD-10-CM | POA: Diagnosis present

## 2023-07-29 DIAGNOSIS — F17210 Nicotine dependence, cigarettes, uncomplicated: Secondary | ICD-10-CM | POA: Diagnosis not present

## 2023-07-29 DIAGNOSIS — Z7951 Long term (current) use of inhaled steroids: Secondary | ICD-10-CM | POA: Insufficient documentation

## 2023-07-29 DIAGNOSIS — W1830XA Fall on same level, unspecified, initial encounter: Secondary | ICD-10-CM | POA: Diagnosis not present

## 2023-07-29 DIAGNOSIS — J441 Chronic obstructive pulmonary disease with (acute) exacerbation: Secondary | ICD-10-CM | POA: Diagnosis not present

## 2023-07-29 LAB — CBC WITH DIFFERENTIAL/PLATELET
Abs Immature Granulocytes: 0.04 10*3/uL (ref 0.00–0.07)
Basophils Absolute: 0.1 10*3/uL (ref 0.0–0.1)
Basophils Relative: 1 %
Eosinophils Absolute: 0 10*3/uL (ref 0.0–0.5)
Eosinophils Relative: 0 %
HCT: 43.4 % (ref 36.0–46.0)
Hemoglobin: 14.3 g/dL (ref 12.0–15.0)
Immature Granulocytes: 0 %
Lymphocytes Relative: 17 %
Lymphs Abs: 2.2 10*3/uL (ref 0.7–4.0)
MCH: 32.5 pg (ref 26.0–34.0)
MCHC: 32.9 g/dL (ref 30.0–36.0)
MCV: 98.6 fL (ref 80.0–100.0)
Monocytes Absolute: 0.8 10*3/uL (ref 0.1–1.0)
Monocytes Relative: 6 %
Neutro Abs: 10 10*3/uL — ABNORMAL HIGH (ref 1.7–7.7)
Neutrophils Relative %: 76 %
Platelets: 318 10*3/uL (ref 150–400)
RBC: 4.4 MIL/uL (ref 3.87–5.11)
RDW: 13.6 % (ref 11.5–15.5)
WBC: 13.1 10*3/uL — ABNORMAL HIGH (ref 4.0–10.5)
nRBC: 0 % (ref 0.0–0.2)

## 2023-07-29 LAB — I-STAT VENOUS BLOOD GAS, ED
Acid-Base Excess: 6 mmol/L — ABNORMAL HIGH (ref 0.0–2.0)
Bicarbonate: 31.3 mmol/L — ABNORMAL HIGH (ref 20.0–28.0)
Calcium, Ion: 1.1 mmol/L — ABNORMAL LOW (ref 1.15–1.40)
HCT: 45 % (ref 36.0–46.0)
Hemoglobin: 15.3 g/dL — ABNORMAL HIGH (ref 12.0–15.0)
O2 Saturation: 99 %
Potassium: 4.1 mmol/L (ref 3.5–5.1)
Sodium: 139 mmol/L (ref 135–145)
TCO2: 33 mmol/L — ABNORMAL HIGH (ref 22–32)
pCO2, Ven: 48.3 mm[Hg] (ref 44–60)
pH, Ven: 7.42 (ref 7.25–7.43)
pO2, Ven: 154 mm[Hg] — ABNORMAL HIGH (ref 32–45)

## 2023-07-29 LAB — BASIC METABOLIC PANEL
Anion gap: 9 (ref 5–15)
BUN: 10 mg/dL (ref 6–20)
CO2: 28 mmol/L (ref 22–32)
Calcium: 8.8 mg/dL — ABNORMAL LOW (ref 8.9–10.3)
Chloride: 102 mmol/L (ref 98–111)
Creatinine, Ser: 0.59 mg/dL (ref 0.44–1.00)
GFR, Estimated: >60 mL/min (ref >60)
Glucose, Bld: 109 mg/dL — ABNORMAL HIGH (ref 70–99)
Potassium: 3.9 mmol/L (ref 3.5–5.1)
Sodium: 139 mmol/L (ref 135–145)

## 2023-07-29 MED ORDER — FENTANYL CITRATE PF 50 MCG/ML IJ SOSY
75.0000 ug | PREFILLED_SYRINGE | Freq: Once | INTRAMUSCULAR | Status: AC
  Administered 2023-07-29: 75 ug via INTRAVENOUS
  Filled 2023-07-29: qty 2

## 2023-07-29 MED ORDER — LORAZEPAM 2 MG/ML IJ SOLN
0.5000 mg | Freq: Once | INTRAMUSCULAR | Status: AC
  Administered 2023-07-29: 0.5 mg via INTRAVENOUS
  Filled 2023-07-29: qty 1

## 2023-07-29 MED ORDER — DIPHENHYDRAMINE HCL 50 MG/ML IJ SOLN
12.5000 mg | Freq: Once | INTRAMUSCULAR | Status: AC
  Administered 2023-07-29: 12.5 mg via INTRAVENOUS
  Filled 2023-07-29: qty 1

## 2023-07-29 MED ORDER — BUPIVACAINE HCL (PF) 0.5 % IJ SOLN
10.0000 mL | Freq: Once | INTRAMUSCULAR | Status: AC
  Administered 2023-07-29: 10 mL
  Filled 2023-07-29: qty 10

## 2023-07-29 MED ORDER — METHYLPREDNISOLONE SODIUM SUCC 125 MG IJ SOLR
125.0000 mg | Freq: Once | INTRAMUSCULAR | Status: AC
  Administered 2023-07-29: 125 mg via INTRAVENOUS
  Filled 2023-07-29: qty 2

## 2023-07-29 MED ORDER — OXYCODONE HCL 5 MG PO TABS
5.0000 mg | ORAL_TABLET | Freq: Once | ORAL | Status: AC
  Administered 2023-07-29: 5 mg via ORAL
  Filled 2023-07-29: qty 1

## 2023-07-29 MED ORDER — IPRATROPIUM-ALBUTEROL 0.5-2.5 (3) MG/3ML IN SOLN
3.0000 mL | Freq: Once | RESPIRATORY_TRACT | Status: AC
Start: 2023-07-29 — End: 2023-07-29
  Administered 2023-07-29: 3 mL via RESPIRATORY_TRACT
  Filled 2023-07-29: qty 3

## 2023-07-29 NOTE — ED Notes (Signed)
With Xray 

## 2023-07-29 NOTE — ED Provider Triage Note (Signed)
Emergency Medicine Provider Triage Evaluation Note  Stacy Bolton , a 52 y.o. female  was evaluated in triage.  Pt complains of L wrist pain after fall.  Patient endorses that she was drinking alcohol, when she slipped and fell, bracing her fall with her left hand.  Obvious deformity to the left wrist.  Denies any head trauma or loss of consciousness.  Denies pain anywhere else..  Review of Systems  Positive: Left wrist pain Negative:   Physical Exam  BP (!) 147/91 (BP Location: Right Arm)   Pulse (!) 105   Temp 98 F (36.7 C) (Oral)   Resp 18   LMP 10/13/2011   SpO2 97%  Gen:   Awake, no distress   Resp:  Normal effort  MSK:   Moves extremities without difficulty  Other:  Cheerful on exam, obvious deformity to the left wrist.  Hesitant to range the digits, normal sensation.  Normal capillary refill.  Medical Decision Making  Medically screening exam initiated at 8:24 PM.  Appropriate orders placed.  Stacy Bolton was informed that the remainder of the evaluation will be completed by another provider, this initial triage assessment does not replace that evaluation, and the importance of remaining in the ED until their evaluation is complete.  Imaging and pain medication ordered   Karol Liendo T, PA-C 07/29/23 2025

## 2023-07-29 NOTE — Congregational Nurse Program (Signed)
  Dept: [phone]   Congregational Nurse Program Note  Date of Encounter: 07/29/2023  Past Medical History: Past Medical History:  Diagnosis Date   Anxiety    Asthma    COPD (chronic obstructive pulmonary disease) (HCC)    Dyspnea    Endometriosis    Seizure [facility])     Encounter Details:  Community Questionnaire - 07/29/23 1045       Questionnaire   Ask client: Do you give verbal consent for me to treat you today? Yes    Student Assistance N/A    Location Patient Served  [facility]    Encounter Setting CN site;Phone/Text/Email    Population Status Unhoused    Insurance [employer]    Insurance/Financial Assistance Referral N/A    Medication Have Medication Insecurities    Medical Provider No    Screening Referrals Made N/A    Medical Referrals Made [facility]    Medical Appointment Completed [facility]    CNP Interventions Advocate/Support;Navigate Healthcare System;Counsel    Screenings CN Performed Blood Pressure;Weight    ED Visit Averted N/A    Life-Saving Intervention Made N/A            Client came to nurse's office requesting help getting medications. She is asking for help getting a PCP and would like to see a femaie provider. Completed triage form to see [facility] MD this Thursday until PCP established. Also gave information and bus pass for [facility] clinic tomorrow if she needed to be seen sooner as she c/o arthritis pain. Made an appointment with Gwinda Passe NP at [facility] [address] [place] February 5th at 8:45.Tried to call client with information and could not leave a message. Will try to locate client at shelter with information. Raevon Broom W RN CN

## 2023-07-29 NOTE — ED Provider Notes (Addendum)
[place] EMERGENCY DEPARTMENT AT [facility] Provider Note  CSN: [id number] Arrival date & time: 07/29/23 2007  Chief Complaint(s) Fall and Arm Injury  HPI Stacy Bolton is a 52 y.o. female history of COPD, homelessness, seizure, bipolar disorder, presented to the emergency department with fall.  Patient reports that she drank 4 4 Loco's, had a trip and fall landing on the left wrist.  She reports pain to the left wrist.  No numbness or tingling.  No weakness.  No other injuries.  No head injury.  No neck pain.  She also reports since arriving to the emergency department she has not had shortness of breath and wheezing.  She reports this feels like a typical COPD exacerbation.  Denies any of this before arriving in the emergency department.   Past Medical History Past Medical History:  Diagnosis Date   Anxiety    Asthma    COPD (chronic obstructive pulmonary disease) (HCC)    Dyspnea    Endometriosis    Seizure [facility])    Patient Active Problem List   Diagnosis Date Noted   Protein-calorie malnutrition, severe 04/29/2023   Acute respiratory failure with hypoxia and hypercapnia (HCC) 04/28/2023   Bipolar I disorder, most recent episode (or current) manic (HCC) 01/31/2023   Cocaine abuse (HCC) 01/31/2023   Acute psychosis (HCC) 01/30/2023   COPD exacerbation (HCC) 01/16/2023   NAUSEA 03/27/2010   Bipolar disorder (HCC) 03/21/2010   Anxiety state 03/21/2010   Asthma 03/21/2010   Pyelonephritis 03/21/2010   History of seizure 03/21/2010   Closed fracture of bone 03/21/2010   Home Medication(s) Prior to Admission medications   Medication Sig Start Date End Date Taking? Authorizing Provider  albuterol (PROVENTIL HFA) 108 (90 Base) MCG/ACT inhaler Inhale 2 puffs into the lungs every 6 (six) hours as needed for wheezing or shortness of breath. 05/03/23   Marguerita Merles Latif, DO  cetirizine (ZYRTEC ALLERGY) 10 MG tablet Take 1 tablet (10 mg total) by mouth daily  for 10 days. 05/03/23 05/13/23  Marguerita Merles Latif, DO  divalproex (DEPAKOTE ER) 500 MG 24 hr tablet Take 1 tablet (500 mg total) by mouth 2 (two) times daily. 05/03/23   Marguerita Merles Latif, DO  feeding supplement (ENSURE ENLIVE / ENSURE PLUS) LIQD Take 237 mLs by mouth 3 (three) times daily between meals. 05/03/23   Marguerita Merles Latif, DO  FLUoxetine (PROZAC) 20 MG capsule Take 1 capsule (20 mg total) by mouth daily. 05/03/23 08/01/23  Marguerita Merles Latif, DO  fluticasone (FLONASE) 50 MCG/ACT nasal spray Place 2 sprays into both nostrils daily. 05/03/23   Marguerita Merles Latif, DO  magnesium citrate SOLN Take 296 mLs (1 Bottle total) by mouth daily as needed for severe constipation. 02/08/23   Clapacs, Jackquline Denmark, MD  Mouthwashes (MOUTH RINSE) LIQD solution 15 mLs by Mouth Rinse route 4 (four) times daily. 05/03/23   Marguerita Merles Latif, DO  Multiple Vitamin (MULTIVITAMIN WITH MINERALS) TABS tablet Take 1 tablet by mouth daily. 05/03/23   Sheikh, Omair Latif, DO  nicotine (NICODERM CQ - DOSED IN MG/24 HOURS) 21 mg/24hr patch Place 1 patch (21 mg total) onto the skin daily. 05/03/23   Marguerita Merles Latif, DO  nicotine polacrilex (NICORETTE) 2 MG gum Take 1 each (2 mg total) by mouth as needed for smoking cessation. 05/03/23   Sheikh, Omair Latif, DO  ondansetron (ZOFRAN) 4 MG tablet Take 1 tablet (4 mg total) by mouth every 6 (six) hours as needed for nausea. 05/03/23  Sheikh, Omair Latif, DO  predniSONE (DELTASONE) 50 MG tablet 1 p.o. daily x 5 07/13/23   Lorre Nick, MD  predniSONE (STERAPRED UNI-PAK 21 TAB) 10 MG (21) TBPK tablet Take 6 tablets on day 1, 5 tablets on day 2, 4 tablets on day 3, 3 tablets on day 4, 2 tabs on day 5, 1 tablet on day 6 and then stop on the seventh 05/03/23   Marguerita Merles Latif, DO  psyllium (HYDROCIL/METAMUCIL) 95 % PACK Take 1 packet by mouth daily. 02/09/23   Clapacs, Jackquline Denmark, MD  risperiDONE (RISPERDAL) 2 MG tablet Take 1 tablet (2 mg total) by mouth at bedtime. 05/03/23   Marguerita Merles Latif,  DO  sodium chloride (OCEAN) 0.65 % SOLN nasal spray Place 1 spray into both nostrils as needed for congestion (nose irritation). 05/03/23   Marguerita Merles Latif, DO  traZODone (DESYREL) 50 MG tablet Take 1 tablet (50 mg total) by mouth at bedtime as needed for sleep. 05/03/23   Merlene Laughter, DO                                                                                                                                    Past Surgical History Past Surgical History:  Procedure Laterality Date   CHOLECYSTECTOMY     Family History History reviewed. No pertinent family history.  Social History Social History   Tobacco Use   Smoking status: Every Day    Current packs/day: 1.50    Average packs/day: 1.5 packs/day for 30.0 years (45.0 ttl pk-yrs)    Types: Cigarettes  Vaping Use   Vaping status: Never Used  Substance Use Topics   Alcohol use: Not Currently   Drug use: Yes    Types: Cocaine, Marijuana    Comment: EVERY THREE DAYS   Allergies Hydrocodone-acetaminophen, Hydromorphone hcl, and Oxycodone-acetaminophen  Review of Systems Review of Systems  All other systems reviewed and are negative.   Physical Exam Vital Signs  I have reviewed the triage vital signs BP 137/88   Pulse 97   Temp 98 F (36.7 C) (Oral)   Resp 18   LMP 10/13/2011   SpO2 99%  Physical Exam Vitals and nursing note reviewed.  Constitutional:      Appearance: Normal appearance.  HENT:     Head: Normocephalic and atraumatic.     Mouth/Throat:     Mouth: Mucous membranes are moist.  Eyes:     Conjunctiva/sclera: Conjunctivae normal.  Cardiovascular:     Rate and Rhythm: Normal rate.  Pulmonary:     Effort: No respiratory distress.     Breath sounds: Wheezing present.     Comments: Tachypnea  Abdominal:     General: Abdomen is flat.  Musculoskeletal:     Comments: Deformity and swelling to the left wrist.  Distal radial, ulnar, median nerve distribution intact, no snuffbox tenderness.   No tenderness to the left elbow, shoulder, clavicle.  Skin:    General: Skin is warm and dry.     Capillary Refill: Capillary refill takes less than 2 seconds.  Neurological:     General: No focal deficit present.     Mental Status: She is alert. Mental status is at baseline.  Psychiatric:        Mood and Affect: Mood normal.        Behavior: Behavior normal.     ED Results and Treatments Labs (all labs ordered are listed, but only abnormal results are displayed) Labs Reviewed  CBC WITH DIFFERENTIAL/PLATELET - Abnormal; Notable for the following components:      Result Value   WBC 13.1 (*)    Neutro Abs 10.0 (*)    All other components within normal limits  BASIC METABOLIC PANEL  I-STAT VENOUS BLOOD GAS, ED                                                                                                                          Radiology DG Chest Portable 1 View  Result Date: 07/29/2023 CLINICAL DATA:  Chest pain with stuffy ears and nose. EXAM: PORTABLE CHEST 1 VIEW COMPARISON:  July 13, 2023 FINDINGS: The heart size and mediastinal contours are within normal limits. There is mild calcification of the aortic arch. The lungs are hyperinflated. No acute infiltrate, pleural effusion or pneumothorax is identified. The visualized skeletal structures are unremarkable. IMPRESSION: No active cardiopulmonary disease. Electronically Signed   By: Aram Candela M.D.   On: 07/29/2023 23:15   DG Wrist Complete Left  Result Date: 07/29/2023 CLINICAL DATA:  Status post fall. EXAM: LEFT WRIST - COMPLETE 3+ VIEW COMPARISON:  None Available. FINDINGS: There is an acute, comminuted fracture deformity involving the distal left radius. Approximately 1/2 shaft width dorsal displacement of the distal fracture site is noted. A mildly displaced fracture of the left ulnar styloid is also seen. There is no evidence of dislocation. Moderate severity diffuse soft tissue swelling is present. IMPRESSION:  Acute fractures of the distal left radius and left ulnar styloid. Electronically Signed   By: Aram Candela M.D.   On: 07/29/2023 23:13    Pertinent labs & imaging results that were available during my care of the patient were reviewed by me and considered in my medical decision making (see MDM for details).  Medications Ordered in ED Medications  diphenhydrAMINE (BENADRYL) injection 12.5 mg (has no administration in time range)  oxyCODONE (Oxy IR/ROXICODONE) immediate release tablet 5 mg (5 mg Oral Given 07/29/23 2049)  fentaNYL (SUBLIMAZE) injection 75 mcg (75 mcg Intravenous Given 07/29/23 2212)  bupivacaine(PF) (MARCAINE) 0.5 % injection 10 mL (10 mLs Infiltration Given 07/29/23 2215)  LORazepam (ATIVAN) injection 0.5 mg (0.5 mg Intravenous Given 07/29/23 2300)  ipratropium-albuterol (DUONEB) 0.5-2.5 (3) MG/3ML nebulizer solution 3 mL (3 mLs Nebulization Given 07/29/23 2315)  methylPREDNISolone sodium succinate (SOLU-MEDROL) 125 mg/2 mL injection 125 mg (125 mg Intravenous Given 07/29/23 2311)  fentaNYL (SUBLIMAZE) injection 75 mcg (75  mcg Intravenous Given 07/29/23 2328)                                                                                                                                     Procedures .Ortho Injury Treatment  Date/Time: 07/29/2023 11:41 PM  Performed by: Lonell Grandchild, MD Authorized by: Lonell Grandchild, MD   Consent:    Consent obtained:  Verbal   Consent given by:  Patient   Risks discussed:  Recurrent dislocation, nerve damage, restricted joint movement, stiffness, irreducible dislocation and fracture   Alternatives discussed:  No treatmentInjury location: wrist Location details: left wrist Injury type: fracture Fracture type: distal radius and ulnar styloid Pre-procedure neurovascular assessment: neurovascularly intact Pre-procedure distal perfusion: normal Pre-procedure neurological function: normal Pre-procedure range of motion:  reduced Anesthesia: hematoma block  Anesthesia: Local anesthesia used: yes Local Anesthetic: bupivacaine 0.5% with epinephrine Anesthetic total: 3 mL  Patient sedated: NoManipulation performed: yes Skin traction used: yes Skeletal traction used: no Reduction successful: no Splint type: sugar tong Splint Applied by: Ortho Tech Supplies used: Ortho-Glass     (including critical care time)  Medical Decision Making / ED Course   MDM:  52 year old presenting to the emergency department with wrist injury.  On exam, patient has obvious deformity to the wrist.  X-rays shows distal radius fracture and ulnar styloid fracture.  Will attempt to place a finger traps and perform hematoma block.  Patient will need splinting and outpatient follow-up with orthopedic surgery.  Patient also with shortness of breath, wheezing.  On exam she has diffuse wheezing, no focal findings.  She reports this feels like prior COPD exacerbation.  No leg swelling or other findings of volume overload to suggest CHF.  Denies any recent cough or fevers to suggest pneumonia.  Will check chest x-ray, treat for COPD exacerbation.   Clinical Course as of 07/29/23 2337  Mon Jul 29, 2023  2333 Signed out to Dr. Manus Gunning pending orthopedic consult, splinting and splint check.  Attempted reduction after hematoma block but patient not really able to tolerate.  Given alcohol intoxication, COPD exacerbation, patient really not a good candidate for sedation at this time.  Will place in splint. [WS]    Clinical Course User Index [WS] Lonell Grandchild, MD     Additional history obtained: -Additional history obtained from ems -External records from outside source obtained and reviewed including: Chart review including previous notes, labs, imaging, consultation notes including prior er visits    Imaging Studies ordered: I ordered imaging studies including CXR, Wrist fx  On my interpretation imaging demonstrates  distal radius and ulnar styloid fx  I independently visualized and interpreted imaging. I agree with the radiologist interpretation   Medicines ordered and prescription drug management: Meds ordered this encounter  Medications   oxyCODONE (Oxy IR/ROXICODONE) immediate release tablet 5 mg   fentaNYL (SUBLIMAZE) injection 75 mcg   bupivacaine(PF) (MARCAINE) 0.5 % injection  10 mL   LORazepam (ATIVAN) injection 0.5 mg   ipratropium-albuterol (DUONEB) 0.5-2.5 (3) MG/3ML nebulizer solution 3 mL   methylPREDNISolone sodium succinate (SOLU-MEDROL) 125 mg/2 mL injection 125 mg    IV methylprednisolone will be converted to either a q12h or q24h frequency with the same total daily dose (TDD).  Ordered Dose: 1 to 125 mg TDD; convert to: TDD q24h.  Ordered Dose: 126 to 250 mg TDD; convert to: TDD div q12h.  Ordered Dose: >250 mg TDD; DAW.   fentaNYL (SUBLIMAZE) injection 75 mcg   diphenhydrAMINE (BENADRYL) injection 12.5 mg    -I have reviewed the patients home medicines and have made adjustments as needed   Social Determinants of Health:  Diagnosis or treatment significantly limited by social determinants of health: current smoker and homelessness   Reevaluation: After the interventions noted above, I reevaluated the patient and found that their symptoms have improved  Co morbidities that complicate the patient evaluation  Past Medical History:  Diagnosis Date   Anxiety    Asthma    COPD (chronic obstructive pulmonary disease) (HCC)    Dyspnea    Endometriosis    Seizure (HCC)       Dispostion: Disposition decision including need for hospitalization was considered, and patient disposition pending at time of sign out.    Final Clinical Impression(s) / ED Diagnoses Final diagnoses:  Closed fracture of distal end of left radius, unspecified fracture morphology, initial encounter  COPD exacerbation (HCC)     This chart was dictated using voice recognition software.  Despite  best efforts to proofread,  errors can occur which can change the documentation meaning.    Lonell Grandchild, MD 07/29/23 2337    Lonell Grandchild, MD 07/29/23 [id number]

## 2023-07-29 NOTE — ED Triage Notes (Addendum)
Pt arrived via [facility] s/p mechanical fall onto left wrist. Pain 10/10. Pt noted to have deformity to left wrist. Pulses intact at present. Etoh ON BOARD. Pt states that she consumed 4 4locos

## 2023-07-30 ENCOUNTER — Other Ambulatory Visit: Payer: Self-pay | Admitting: Orthopedic Surgery

## 2023-07-30 ENCOUNTER — Emergency Department (HOSPITAL_COMMUNITY): Payer: Self-pay

## 2023-07-30 LAB — TROPONIN I (HIGH SENSITIVITY)
Troponin I (High Sensitivity): 3 ng/L (ref <18)
Troponin I (High Sensitivity): 3 ng/L (ref <18)

## 2023-07-30 MED ORDER — OXYCODONE HCL 5 MG PO TABS: 5.0000 mg | ORAL_TABLET | Freq: Four times a day (QID) | ORAL | 0 refills | Status: DC | PRN

## 2023-07-30 MED ORDER — PREDNISONE 50 MG PO TABS
ORAL_TABLET | ORAL | 0 refills | Status: DC
Start: 2023-07-30 — End: 2023-08-02

## 2023-07-30 MED ORDER — ALBUTEROL SULFATE HFA 108 (90 BASE) MCG/ACT IN AERS: 1.0000 | INHALATION_SPRAY | Freq: Four times a day (QID) | RESPIRATORY_TRACT | 0 refills | Status: DC | PRN
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
## 2023-07-30 NOTE — Discharge Instructions (Signed)
Keep splint in place and follow-up with a hand surgeon in 1 week.  Take the pain medication as prescribed.  Use your albuterol every 4 hours as needed for difficulty breathing.  Return to the ED with difficulty breathing, chest pain, any other concerns.
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
## 2023-07-30 NOTE — Progress Notes (Signed)
Orthopedic Tech Progress Note Patient Details:  Stacy Bolton [birth year] [id number]  Applied sugar tong splint Ortho Devices Type of Ortho Device: Shoulder immobilizer Ortho Device/Splint Location: LUE Ortho Device/Splint Interventions: Ordered, Application, Adjustment   Post Interventions Patient Tolerated: Well Instructions Provided: Care of device, Adjustment of device  Sherilyn Banker 07/30/2023, 12:21 AM

## 2023-07-31 ENCOUNTER — Telehealth: Payer: Self-pay | Admitting: Surgery

## 2023-07-31 ENCOUNTER — Emergency Department (HOSPITAL_COMMUNITY)
Admission: EM | Admit: 2023-07-31 | Discharge: 2023-08-01 | Disposition: A | Discharge status: Home / Self Care | Payer: Medicaid Other | Attending: Emergency Medicine | Admitting: Emergency Medicine

## 2023-07-31 ENCOUNTER — Other Ambulatory Visit: Payer: Self-pay

## 2023-07-31 ENCOUNTER — Encounter (HOSPITAL_COMMUNITY): Payer: Self-pay

## 2023-07-31 DIAGNOSIS — J45909 Unspecified asthma, uncomplicated: Secondary | ICD-10-CM | POA: Insufficient documentation

## 2023-07-31 DIAGNOSIS — Z7951 Long term (current) use of inhaled steroids: Secondary | ICD-10-CM | POA: Insufficient documentation

## 2023-07-31 DIAGNOSIS — M25532 Pain in left wrist: Secondary | ICD-10-CM | POA: Diagnosis present

## 2023-07-31 DIAGNOSIS — W01198A Fall on same level from slipping, tripping and stumbling with subsequent striking against other object, initial encounter: Secondary | ICD-10-CM | POA: Insufficient documentation

## 2023-07-31 DIAGNOSIS — R1084 Generalized abdominal pain: Secondary | ICD-10-CM | POA: Diagnosis not present

## 2023-07-31 DIAGNOSIS — J449 Chronic obstructive pulmonary disease, unspecified: Secondary | ICD-10-CM | POA: Insufficient documentation

## 2023-07-31 DIAGNOSIS — Z7952 Long term (current) use of systemic steroids: Secondary | ICD-10-CM | POA: Diagnosis not present

## 2023-07-31 LAB — COMPREHENSIVE METABOLIC PANEL
ALT: 35 U/L (ref 0–44)
AST: 38 U/L (ref 15–41)
Albumin: 3.6 g/dL (ref 3.5–5.0)
Alkaline Phosphatase: 81 U/L (ref 38–126)
Anion gap: 7 (ref 5–15)
BUN: 15 mg/dL (ref 6–20)
CO2: 28 mmol/L (ref 22–32)
Calcium: 9 mg/dL (ref 8.9–10.3)
Chloride: 105 mmol/L (ref 98–111)
Creatinine, Ser: 0.71 mg/dL (ref 0.44–1.00)
GFR, Estimated: >60 mL/min (ref >60)
Glucose, Bld: 93 mg/dL (ref 70–99)
Potassium: 3.6 mmol/L (ref 3.5–5.1)
Sodium: 140 mmol/L (ref 135–145)
Total Bilirubin: 0.8 mg/dL (ref <1.2)
Total Protein: 6 g/dL — ABNORMAL LOW (ref 6.5–8.1)

## 2023-07-31 LAB — CBC WITH DIFFERENTIAL/PLATELET
Abs Immature Granulocytes: 0.03 10*3/uL (ref 0.00–0.07)
Basophils Absolute: 0.1 10*3/uL (ref 0.0–0.1)
Basophils Relative: 1 %
Eosinophils Absolute: 0 10*3/uL (ref 0.0–0.5)
Eosinophils Relative: 0 %
HCT: 43.7 % (ref 36.0–46.0)
Hemoglobin: 13.9 g/dL (ref 12.0–15.0)
Immature Granulocytes: 0 %
Lymphocytes Relative: 13 %
Lymphs Abs: 1.3 10*3/uL (ref 0.7–4.0)
MCH: 31.7 pg (ref 26.0–34.0)
MCHC: 31.8 g/dL (ref 30.0–36.0)
MCV: 99.8 fL (ref 80.0–100.0)
Monocytes Absolute: 0.9 10*3/uL (ref 0.1–1.0)
Monocytes Relative: 9 %
Neutro Abs: 7.7 10*3/uL (ref 1.7–7.7)
Neutrophils Relative %: 77 %
Platelets: 312 10*3/uL (ref 150–400)
RBC: 4.38 MIL/uL (ref 3.87–5.11)
RDW: 13.8 % (ref 11.5–15.5)
WBC: 10 10*3/uL (ref 4.0–10.5)
nRBC: 0 % (ref 0.0–0.2)

## 2023-07-31 LAB — LIPASE, BLOOD: Lipase: 29 U/L (ref 11–51)

## 2023-07-31 NOTE — ED Triage Notes (Signed)
Patient c/o wrist pain from broken wrist and believes she injured it showering last night. Patient also c/o abdominal pain since using the restroom and having problems with her prolapsed rectum. Patient A&Ox4 at this time.

## 2023-07-31 NOTE — Telephone Encounter (Signed)
Received call from patient concerning her pain medications being sent to a closer pharmacy. Patient states she does not have transportation. Sent message to Dr. Manus Gunning who will follow up tonight.  Patient was updated.

## 2023-07-31 NOTE — ED Provider Triage Note (Signed)
Emergency Medicine Provider Triage Evaluation Note  Stacy Bolton , a 52 y.o. female  was evaluated in triage.  Pt complains of left wrist pain.  Recently fractured the wrist.  States she got the splint wet in the shower last night which deformed the splint.  Reports significant pain.  States he has been taking Tylenol without improvement.  Additionally states that just prior to arrival she feels like her rectum prolapsed which has happened in the past.  She is scheduled for surgery for this.  She states some fecal incontinence with this as well.  Review of Systems  Positive: As above Negative: As above  Physical Exam  BP (!) 125/91   Pulse (!) 104   Temp 98.5 F (36.9 C)   Resp 20   LMP 10/13/2011   SpO2 94%  Gen:   Awake, no distress   Resp:  Normal effort  MSK:   Moves extremities without difficulty  Other:    Medical Decision Making  Medically screening exam initiated at 3:22 PM.  Appropriate orders placed.  Stacy Bolton was informed that the remainder of the evaluation will be completed by another provider, this initial triage assessment does not replace that evaluation, and the importance of remaining in the ED until their evaluation is complete.  Workup initiated   Michelle Piper, Cordelia Poche 07/31/23 1522

## 2023-08-01 ENCOUNTER — Ambulatory Visit: Payer: Self-pay

## 2023-08-01 ENCOUNTER — Ambulatory Visit: Payer: Self-pay | Admitting: *Deleted

## 2023-08-01 ENCOUNTER — Other Ambulatory Visit: Payer: Self-pay

## 2023-08-01 ENCOUNTER — Emergency Department (HOSPITAL_COMMUNITY)
Admission: EM | Admit: 2023-08-01 | Discharge: 2023-08-02 | Disposition: A | Discharge status: Home / Self Care | Payer: Medicaid Other | Attending: Emergency Medicine | Admitting: Emergency Medicine

## 2023-08-01 DIAGNOSIS — R109 Unspecified abdominal pain: Secondary | ICD-10-CM | POA: Diagnosis not present

## 2023-08-01 DIAGNOSIS — K6289 Other specified diseases of anus and rectum: Secondary | ICD-10-CM | POA: Diagnosis not present

## 2023-08-01 DIAGNOSIS — Z59819 Housing instability, housed unspecified: Secondary | ICD-10-CM | POA: Insufficient documentation

## 2023-08-01 DIAGNOSIS — G8929 Other chronic pain: Secondary | ICD-10-CM | POA: Insufficient documentation

## 2023-08-01 DIAGNOSIS — M25532 Pain in left wrist: Secondary | ICD-10-CM | POA: Insufficient documentation

## 2023-08-01 DIAGNOSIS — J449 Chronic obstructive pulmonary disease, unspecified: Secondary | ICD-10-CM | POA: Diagnosis not present

## 2023-08-01 LAB — URINALYSIS, ROUTINE W REFLEX MICROSCOPIC
Bilirubin Urine: NEGATIVE
Glucose, UA: NEGATIVE mg/dL
Hgb urine dipstick: NEGATIVE
Ketones, ur: NEGATIVE mg/dL
Leukocytes,Ua: NEGATIVE
Nitrite: POSITIVE — AB
Protein, ur: NEGATIVE mg/dL
Specific Gravity, Urine: 1.025 (ref 1.005–1.030)
pH: 5 (ref 5.0–8.0)

## 2023-08-01 LAB — CBC
HCT: 44.1 % (ref 36.0–46.0)
Hemoglobin: 14.1 g/dL (ref 12.0–15.0)
MCH: 32.6 pg (ref 26.0–34.0)
MCHC: 32 g/dL (ref 30.0–36.0)
MCV: 101.8 fL — ABNORMAL HIGH (ref 80.0–100.0)
Platelets: 306 10*3/uL (ref 150–400)
RBC: 4.33 MIL/uL (ref 3.87–5.11)
RDW: 13.6 % (ref 11.5–15.5)
WBC: 9.3 10*3/uL (ref 4.0–10.5)
nRBC: 0 % (ref 0.0–0.2)

## 2023-08-01 LAB — LIPASE, BLOOD: Lipase: 34 U/L (ref 11–51)

## 2023-08-01 LAB — COMPREHENSIVE METABOLIC PANEL
ALT: 25 U/L (ref 0–44)
AST: 26 U/L (ref 15–41)
Albumin: 3.4 g/dL — ABNORMAL LOW (ref 3.5–5.0)
Alkaline Phosphatase: 84 U/L (ref 38–126)
Anion gap: 8 (ref 5–15)
BUN: 12 mg/dL (ref 6–20)
CO2: 27 mmol/L (ref 22–32)
Calcium: 8.8 mg/dL — ABNORMAL LOW (ref 8.9–10.3)
Chloride: 105 mmol/L (ref 98–111)
Creatinine, Ser: 0.75 mg/dL (ref 0.44–1.00)
GFR, Estimated: >60 mL/min (ref >60)
Glucose, Bld: 141 mg/dL — ABNORMAL HIGH (ref 70–99)
Potassium: 4.2 mmol/L (ref 3.5–5.1)
Sodium: 140 mmol/L (ref 135–145)
Total Bilirubin: 0.4 mg/dL (ref <1.2)
Total Protein: 5.9 g/dL — ABNORMAL LOW (ref 6.5–8.1)

## 2023-08-01 MED ORDER — KETOROLAC TROMETHAMINE 15 MG/ML IJ SOLN
15.0000 mg | Freq: Once | INTRAMUSCULAR | Status: AC
  Administered 2023-08-02: 15 mg via INTRAMUSCULAR
  Filled 2023-08-01: qty 1

## 2023-08-01 MED ORDER — KETOROLAC TROMETHAMINE 30 MG/ML IJ SOLN
30.0000 mg | Freq: Once | INTRAMUSCULAR | Status: AC
  Administered 2023-08-01: 30 mg via INTRAMUSCULAR
  Filled 2023-08-01: qty 1

## 2023-08-01 MED ORDER — OXYCODONE HCL 5 MG PO TABS
5.0000 mg | ORAL_TABLET | Freq: Once | ORAL | Status: AC
  Administered 2023-08-02: 5 mg via ORAL
  Filled 2023-08-01: qty 1

## 2023-08-01 MED ORDER — FOSFOMYCIN TROMETHAMINE 3 G PO PACK
3.0000 g | PACK | Freq: Once | ORAL | Status: AC
  Administered 2023-08-02: 3 g via ORAL
  Filled 2023-08-01: qty 3

## 2023-08-01 MED ORDER — HYDROCODONE-ACETAMINOPHEN 5-325 MG PO TABS
2.0000 | ORAL_TABLET | Freq: Once | ORAL | Status: AC
  Administered 2023-08-01: 2 via ORAL
  Filled 2023-08-01: qty 2

## 2023-08-01 MED ORDER — DIPHENHYDRAMINE HCL 25 MG PO CAPS
25.0000 mg | ORAL_CAPSULE | Freq: Once | ORAL | Status: AC
  Administered 2023-08-01: 25 mg via ORAL
  Filled 2023-08-01: qty 1

## 2023-08-01 NOTE — ED Provider Notes (Signed)
[place] [facility] AT [facility] Provider Note   CSN: [id number] Arrival date & time: 08/01/23  3244     History  Chief Complaint  Patient presents with   Abdominal Pain   Foreign Body in Rectum   prolapse bowel   Wrist Pain    Stacy Bolton is a 52 y.o. female.  53 y/o female with hx of COPD, seizures, anxiety, and chronic rectal prolapse presents to the ED with multiple complaints. Discharged from the department approximately 21 hours ago. Returning for similar issues.   Initially reporting ongoing pain in her L wrist associated with distal radial fracture diagnosed on 07/29/23. She has not taken any medications for pain because she does not have the money to get her Rx filled. Patient has not had a sling to use; notes some swelling in her fingers. No pallor or numbness. Had splint replaced yesterday after it got wet when she fell in the shower.   Also reporting ongoing rectal prolapse. States she had a bowel movement earlier which caused prolapse of her rectum; it took her "50 minutes to get it back in". Patient endorses some BRBPR when attempting to reduce her prolapse. She states she is afraid to have another bowel movement in case it happens again. Patient denies ever following up with a surgeon outside of a hospital/ED setting for this issue.   Finally, patient emphasizing her ongoing housing instability. States this is causing her a lot of stress and anxiety.  The history is provided by the patient. No language interpreter was used.  Abdominal Pain Foreign Body in Rectum Associated symptoms include abdominal pain.  Wrist Pain Associated symptoms include abdominal pain.       Home Medications Prior to Admission medications   Medication Sig Start Date End Date Taking? Authorizing Provider  albuterol (PROVENTIL HFA) 108 (90 Base) MCG/ACT inhaler Inhale 2 puffs into the lungs every 6 (six) hours as needed for wheezing or shortness of  breath. 05/03/23   Marguerita Merles Latif, DO  albuterol (VENTOLIN HFA) 108 (90 Base) MCG/ACT inhaler Inhale 1-2 puffs into the lungs every 6 (six) hours as needed for wheezing or shortness of breath. 07/30/23   Rancour, Jeannett Senior, MD  cetirizine (ZYRTEC ALLERGY) 10 MG tablet Take 1 tablet (10 mg total) by mouth daily for 10 days. 05/03/23 05/13/23  Marguerita Merles Latif, DO  divalproex (DEPAKOTE ER) 500 MG 24 hr tablet Take 1 tablet (500 mg total) by mouth 2 (two) times daily. 05/03/23   Marguerita Merles Latif, DO  feeding supplement (ENSURE ENLIVE / ENSURE PLUS) LIQD Take 237 mLs by mouth 3 (three) times daily between meals. 05/03/23   Marguerita Merles Latif, DO  FLUoxetine (PROZAC) 20 MG capsule Take 1 capsule (20 mg total) by mouth daily. 05/03/23 08/01/23  Marguerita Merles Latif, DO  fluticasone (FLONASE) 50 MCG/ACT nasal spray Place 2 sprays into both nostrils daily. 05/03/23   Marguerita Merles Latif, DO  magnesium citrate SOLN Take 296 mLs (1 Bottle total) by mouth daily as needed for severe constipation. 02/08/23   Clapacs, Jackquline Denmark, MD  Mouthwashes (MOUTH RINSE) LIQD solution 15 mLs by Mouth Rinse route 4 (four) times daily. 05/03/23   Marguerita Merles Latif, DO  Multiple Vitamin (MULTIVITAMIN WITH MINERALS) TABS tablet Take 1 tablet by mouth daily. 05/03/23   Sheikh, Omair Latif, DO  nicotine (NICODERM CQ - DOSED IN MG/24 HOURS) 21 mg/24hr patch Place 1 patch (21 mg total) onto the skin daily. 05/03/23   Marguerita Merles  Latif, DO  nicotine polacrilex (NICORETTE) 2 MG gum Take 1 each (2 mg total) by mouth as needed for smoking cessation. 05/03/23   Sheikh, Omair Latif, DO  ondansetron (ZOFRAN) 4 MG tablet Take 1 tablet (4 mg total) by mouth every 6 (six) hours as needed for nausea. 05/03/23   Sheikh, Kateri Mc Latif, DO  oxyCODONE (ROXICODONE) 5 MG immediate release tablet Take 1 tablet (5 mg total) by mouth every 6 (six) hours as needed for severe pain (pain score 7-10). 08/02/23   Antony Madura, PA-C  predniSONE (DELTASONE) 50 MG tablet 1 tablet PO  daily 08/02/23   Antony Madura, PA-C  psyllium (HYDROCIL/METAMUCIL) 95 % PACK Take 1 packet by mouth daily. 02/09/23   Clapacs, Jackquline Denmark, MD  risperiDONE (RISPERDAL) 2 MG tablet Take 1 tablet (2 mg total) by mouth at bedtime. 05/03/23   Marguerita Merles Latif, DO  sodium chloride (OCEAN) 0.65 % SOLN nasal spray Place 1 spray into both nostrils as needed for congestion (nose irritation). 05/03/23   Marguerita Merles Latif, DO  traZODone (DESYREL) 50 MG tablet Take 1 tablet (50 mg total) by mouth at bedtime as needed for sleep. 05/03/23   Marguerita Merles Latif, DO      Allergies    Hydrocodone-acetaminophen, Hydromorphone hcl, and Oxycodone-acetaminophen    Review of Systems   Review of Systems  Gastrointestinal:  Positive for abdominal pain.  Ten systems reviewed and are negative for acute change, except as noted in the HPI.    Physical Exam Updated Vital Signs BP 122/89   Pulse 91   Temp 98 F (36.7 C)   Resp 18   LMP 10/13/2011   SpO2 96%   Physical Exam Vitals and nursing note reviewed.  Constitutional:      General: She is not in acute distress.    Appearance: She is well-developed. She is not diaphoretic.     Comments: Generally well kept. Tearful.   HENT:     Head: Normocephalic and atraumatic.  Eyes:     General: No scleral icterus.    Extraocular Movements: EOM normal.     Conjunctiva/sclera: Conjunctivae normal.  Cardiovascular:     Rate and Rhythm: Normal rate and regular rhythm.     Pulses: Normal pulses.  Pulmonary:     Effort: Pulmonary effort is normal. No respiratory distress.     Comments: Respirations even and unlabored Abdominal:     Palpations: Abdomen is soft. There is no mass.     Tenderness: There is no guarding.     Comments: Soft, nondistended abdomen. No focal TTP, palpable masses.  Genitourinary:    Comments: No rectal prolapse at the time of exam. Exam chaperoned by Elmarie Shiley, RN. No BRBPR, anal induration, hemorrhoids. Musculoskeletal:     Cervical back: Normal  range of motion.     Comments: Splint to LUE  Skin:    General: Skin is warm and dry.     Coloration: Skin is not pale.     Findings: No erythema or rash.  Neurological:     Mental Status: She is alert and oriented to person, place, and time.     Coordination: Coordination normal.  Psychiatric:        Mood and Affect: Mood is anxious. Affect is tearful.        Speech: Speech is rapid and pressured.     ED Results / Procedures / Treatments   Labs (all labs ordered are listed, but only abnormal results are displayed) Labs Reviewed  COMPREHENSIVE METABOLIC PANEL - Abnormal; Notable for the following components:      Result Value   Glucose, Bld 141 (*)    Calcium 8.8 (*)    Total Protein 5.9 (*)    Albumin 3.4 (*)    All other components within normal limits  CBC - Abnormal; Notable for the following components:   MCV 101.8 (*)    All other components within normal limits  URINALYSIS, ROUTINE W REFLEX MICROSCOPIC - Abnormal; Notable for the following components:   APPearance HAZY (*)    Nitrite POSITIVE (*)    Bacteria, UA MANY (*)    All other components within normal limits  URINE CULTURE  LIPASE, BLOOD    EKG None  Radiology No results found.  Procedures Procedures    Medications Ordered in ED Medications  ketorolac (TORADOL) 15 MG/ML injection 15 mg (15 mg Intramuscular Given 08/02/23 0021)  oxyCODONE (Oxy IR/ROXICODONE) immediate release tablet 5 mg (5 mg Oral Given 08/02/23 0021)  fosfomycin (MONUROL) packet 3 g (3 g Oral Given 08/02/23 0021)    ED Course/ Medical Decision Making/ A&P Clinical Course as of 08/02/23 0613  Fri Aug 02, 2023  0118 PDMP reviewed. Patient has not filled the Rx for prednisone or oxycodone prescribed on 07/30/23. Will resend to [facility] pharmacy at [facility]. [KH]    Clinical Course User Index [KH] Antony Madura, PA-C                                 Medical Decision Making Amount and/or Complexity of Data Reviewed Labs:  ordered.  Risk Prescription drug management.   This patient presents to the ED for concern of L wrist pain and rectal pain, this involves an extensive number of treatment options, and is a complaint that carries with it a high risk of complications and morbidity.  The differential diagnosis includes uncomplicated wrist fracture pain vs compartment syndrome vs splint constriction; with respect to rectal pain differential includes proctitis vs hemorrhoid vs rectal prolapse vs perianal abscess.   Co morbidities that complicate the patient evaluation  COPD Seizures Chronic rectal prolapse   Additional history obtained:  External records from outside source obtained and reviewed including Xray L wrist from 07/29/23.    Lab Tests:  I Ordered, and personally interpreted labs.  The pertinent results include:  UA with bacteriuria, 6-10 WBCs, positive nitrities. Normal WBC and creatinine.   Cardiac Monitoring:  The patient was maintained on a cardiac monitor.  I personally viewed and interpreted the cardiac monitored which showed an underlying rhythm of: NSR   Medicines ordered and prescription drug management:  I ordered medication including Toradol and Oxycodone for wrist pain. Monurol for coverage of uncomplicated UTI  Reevaluation of the patient after these medicines showed that the patient improved I have reviewed the patients home medicines and have made adjustments as needed   Test Considered:  CT abd/pelvis   Problem List / ED Course:  Patient presents to the emergency department for multiple complaints. With respect to wrist, patient neurovascularly intact on exam. No erythema, heat to touch to the affected area; no concern for septic joint. Compartments in the affected extremity are soft. No pallor. Splint appears in appropriate position. Given oxycodone in the ED for pain.  Patient also with chronic rectal prolapse. Seen for this in the ED since at least May of 2024. Has  not followed up outpatient. Abdominal exam is benign.  No rectal prolapse at time of bedside assessment; exam chaperoned by RN. Will necessitate outpatient follow up. Given referral to general surgery.  Did try to engage [facility] for medication assistance pending wrist surgery. Do not feel any acute/emergent medical condition is present warranting additional evaluation Stable for discharge.   Reevaluation:  After the interventions noted above, I reevaluated the patient and found that they have :stayed the same   Social Determinants of Health:  Homeless    Dispostion:  After consideration of the diagnostic results and the patients response to treatment, I feel that the patent would benefit from outpatient f/u. Referral and resources given. Return precautions discussed and provided. Patient discharged in stable condition with no unaddressed concerns.         Final Clinical Impression(s) / ED Diagnoses Final diagnoses:  Left wrist pain  Rectal pain, chronic  Housing instability    Rx / DC Orders ED Discharge Orders          Ordered    predniSONE (DELTASONE) 50 MG tablet        08/02/23 0120    oxyCODONE (ROXICODONE) 5 MG immediate release tablet  Every 6 hours PRN        08/02/23 0120              Antony Madura, PA-C 08/02/23 1610    Zadie Rhine, MD 08/02/23 [phone]
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
## 2023-08-01 NOTE — Discharge Instructions (Addendum)
° °  HOMELESS SHELTERS  [facility]     [facility]   [address], [place]     [phone]              [facility] (women and children)       [address] [place], [place] [address] [phone] [email or web address] for application and process Application Required  [facility]   [address]    [place] [address]     [phone]                    [facility] [address] [place], [place] [address] [phone](schedule application appt.) Application Required  [facility] (women only)    [address]     [place], [place] [address]     [phone]      Intake starts 6pm daily Need valid ID, SSC, & Police report [facility] [place] [address] [place], [place] [phone] Application Required  [facility] (men only)     [address]      [place], [place]     [phone]       [facility] (Pregnant women only) [address] [place], [place] [phone]  [facility]      [address]      [place], [place] [address]     [phone]             [facility] [address] [place], [place] [phone] 90 day commitment/SA/Application process  [facility](men only)     [address]     [place], [place]     [phone]       Check-in at [facility]7pm            [facility] [address] [place], [place] [address] [phone] Men/Women/Women and Children must be there by 7 pm  [facility] [place], [place] [phone]

## 2023-08-01 NOTE — Telephone Encounter (Signed)
  Chief Complaint: continued pain , see previous NT encounter  Symptoms: na Frequency: na Pertinent Negatives: Patient denies na Disposition: [x] ED /[] Urgent Care (no appt availability in office) / [] Appointment(In office/virtual)/ []  [place] [facility]/ [] Home Care/ [] Refused Recommended Disposition /[] [place] [facility]/ []  Follow-up with PCP Additional Notes:   Recommended go back to ED for assistance and SW/CM assistance      Reason for Disposition  Caller has already spoken with another triager or PCP AND has further questions AND triager able to answer questions.  Answer Assessment - Initial Assessment Questions N/A Duplicate contact. See previous triage encounter  Protocols used: No Contact or Duplicate Contact Call-A-AH
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
## 2023-08-01 NOTE — ED Provider Notes (Signed)
[place] EMERGENCY DEPARTMENT AT [facility] Provider Note   CSN: [id number] Arrival date & time: 07/31/23  1445     History  Chief Complaint  Patient presents with   Wrist Pain   Abdominal Pain    Stacy Bolton is a 52 y.o. female.  The history is provided by the patient.  Patient history of COPD, anxiety presents with multiple complaints  Patient reports recent left wrist fracture and had a splint placed. She reports while she was in the shower yesterday she slipped and fell hitting her left wrist again.  She reports her splint is now wet and deformed.  She reports increased pain. Denies any head injury or LOC.  No other traumatic injuries from the fall in the shower  Patient reports when she got to the ER she had abdominal pain and a bowel movement which caused rectal prolapse.  She reports a long history of this but this seemed more severe.  She reports she is scheduled to have surgery at some point for her abdominal issues.    Past Medical History:  Diagnosis Date   Anxiety    Asthma    COPD (chronic obstructive pulmonary disease) (HCC)    Dyspnea    Endometriosis    Seizure (HCC)     Home Medications Prior to Admission medications   Medication Sig Start Date End Date Taking? Authorizing Provider  albuterol (PROVENTIL HFA) 108 (90 Base) MCG/ACT inhaler Inhale 2 puffs into the lungs every 6 (six) hours as needed for wheezing or shortness of breath. 05/03/23   Marguerita Merles Latif, DO  albuterol (VENTOLIN HFA) 108 (90 Base) MCG/ACT inhaler Inhale 1-2 puffs into the lungs every 6 (six) hours as needed for wheezing or shortness of breath. 07/30/23   Rancour, Jeannett Senior, MD  cetirizine (ZYRTEC ALLERGY) 10 MG tablet Take 1 tablet (10 mg total) by mouth daily for 10 days. 05/03/23 05/13/23  Marguerita Merles Latif, DO  divalproex (DEPAKOTE ER) 500 MG 24 hr tablet Take 1 tablet (500 mg total) by mouth 2 (two) times daily. 05/03/23   Marguerita Merles Latif, DO  feeding  supplement (ENSURE ENLIVE / ENSURE PLUS) LIQD Take 237 mLs by mouth 3 (three) times daily between meals. 05/03/23   Marguerita Merles Latif, DO  FLUoxetine (PROZAC) 20 MG capsule Take 1 capsule (20 mg total) by mouth daily. 05/03/23 08/01/23  Marguerita Merles Latif, DO  fluticasone (FLONASE) 50 MCG/ACT nasal spray Place 2 sprays into both nostrils daily. 05/03/23   Marguerita Merles Latif, DO  magnesium citrate SOLN Take 296 mLs (1 Bottle total) by mouth daily as needed for severe constipation. 02/08/23   Clapacs, Jackquline Denmark, MD  Mouthwashes (MOUTH RINSE) LIQD solution 15 mLs by Mouth Rinse route 4 (four) times daily. 05/03/23   Marguerita Merles Latif, DO  Multiple Vitamin (MULTIVITAMIN WITH MINERALS) TABS tablet Take 1 tablet by mouth daily. 05/03/23   Sheikh, Omair Latif, DO  nicotine (NICODERM CQ - DOSED IN MG/24 HOURS) 21 mg/24hr patch Place 1 patch (21 mg total) onto the skin daily. 05/03/23   Marguerita Merles Latif, DO  nicotine polacrilex (NICORETTE) 2 MG gum Take 1 each (2 mg total) by mouth as needed for smoking cessation. 05/03/23   Sheikh, Omair Latif, DO  ondansetron (ZOFRAN) 4 MG tablet Take 1 tablet (4 mg total) by mouth every 6 (six) hours as needed for nausea. 05/03/23   Marguerita Merles Latif, DO  oxyCODONE (ROXICODONE) 5 MG immediate release tablet Take 1 tablet (5 mg  total) by mouth every 6 (six) hours as needed for severe pain (pain score 7-10). 07/30/23   Rancour, Jeannett Senior, MD  predniSONE (DELTASONE) 50 MG tablet 1 tablet PO daily 07/30/23   Rancour, Jeannett Senior, MD  psyllium (HYDROCIL/METAMUCIL) 95 % PACK Take 1 packet by mouth daily. 02/09/23   Clapacs, Jackquline Denmark, MD  risperiDONE (RISPERDAL) 2 MG tablet Take 1 tablet (2 mg total) by mouth at bedtime. 05/03/23   Marguerita Merles Latif, DO  sodium chloride (OCEAN) 0.65 % SOLN nasal spray Place 1 spray into both nostrils as needed for congestion (nose irritation). 05/03/23   Marguerita Merles Latif, DO  traZODone (DESYREL) 50 MG tablet Take 1 tablet (50 mg total) by mouth at bedtime as needed  for sleep. 05/03/23   Marguerita Merles Latif, DO      Allergies    Hydrocodone-acetaminophen, Hydromorphone hcl, and Oxycodone-acetaminophen    Review of Systems   Review of Systems  Musculoskeletal:  Positive for arthralgias.    Physical Exam Updated Vital Signs BP 121/85 (BP Location: Right Arm)   Pulse 84   Temp 98.2 F (36.8 C)   Resp 17   Ht 1.664 m (5' 5.5")   Wt 49.1 kg   LMP 10/13/2011   SpO2 99%   BMI 17.73 kg/m  Physical Exam CONSTITUTIONAL: Disheveled and anxious HEAD: Normocephalic/atraumatic EYES: EOMI/PERRL ENMT: Mucous membranes moist NECK: supple no meningeal signs SPINE/BACK:entire spine nontender No bruising/crepitance/stepoffs noted to spine ABDOMEN: soft, mild diffuse tenderness, no bruising Rectal exam-patient refused NEURO: Pt is awake/alert/appropriate, moves all extremitiesx4.  No facial droop.   EXTREMITIES: Splint to left upper extremity was removed.  Swelling and tenderness noted to the left wrist.  Distal pulses intact.  Bruising is noted.  She can fully range her left elbow All other extremities/joints palpated/ranged and nontender SKIN: warm, color normal PSYCH: Anxious  ED Results / Procedures / Treatments   Labs (all labs ordered are listed, but only abnormal results are displayed) Labs Reviewed  COMPREHENSIVE METABOLIC PANEL - Abnormal; Notable for the following components:      Result Value   Total Protein 6.0 (*)    All other components within normal limits  CBC WITH DIFFERENTIAL/PLATELET  LIPASE, BLOOD    EKG None  Radiology No results found.  Procedures .Ortho Injury Treatment  Date/Time: 08/01/2023 2:13 AM  Performed by: Zadie Rhine, MD Authorized by: Zadie Rhine, MD   Consent:    Consent obtained:  Verbal   Consent given by:  Patient   Alternatives discussed:  No treatmentInjury location: wrist Location details: left wrist Injury type: fracture Fracture type: distal radius Pre-procedure neurovascular  assessment: neurovascularly intact Pre-procedure distal perfusion: normal Pre-procedure neurological function: normal Pre-procedure range of motion: reduced Manipulation performed: no Immobilization: splint Splint type: sugar tong Splint Applied by: Milon Dikes Post-procedure distal perfusion: normal Post-procedure neurological function: normal Post-procedure range of motion: unchanged       Medications Ordered in ED Medications  diphenhydrAMINE (BENADRYL) capsule 25 mg (has no administration in time range)  HYDROcodone-acetaminophen (NORCO/VICODIN) 5-325 MG per tablet 2 tablet (has no administration in time range)  ketorolac (TORADOL) 30 MG/ML injection 30 mg (30 mg Intramuscular Given 08/01/23 0136)    ED Course/ Medical Decision Making/ A&P Clinical Course as of 08/01/23 0214  Thu Aug 01, 2023  0130 Patient just recently sustained a left distal radius fracture earlier this week.  She was discharged with a splint and is scheduled for operative management later this month.  She reports slipping in  the shower and the splint became and aggravated her pain. The splint was removed the patient does have swelling over the wrist and hand. No other acute injuries are noted to her extremities Plan to replace the splint  Patient also reports upon arrival to the hospital she had a bowel movement with rectal prolapse.  She reports she has a long history of this and is supposed to have surgery.  Labs were performed and are overall unremarkable.  Patient refused to have a rectal exam. this appears to be more of a chronic issue. [DW]  0213 New splint has been applied, patient feels improved She is now eating a sandwich in no distress.  Reports her abdominal complaints are chronic issue, will defer further workup.  No indication for further workup at this time.  Patient safe for discharge home she request oral pain medicines with Benadryl to help with itching [DW]    Clinical Course User  Index [DW] Zadie Rhine, MD                                 Medical Decision Making Risk Prescription drug management.           Final Clinical Impression(s) / ED Diagnoses Final diagnoses:  Left wrist pain  Generalized abdominal pain    Rx / DC Orders ED Discharge Orders     None         Zadie Rhine, MD 08/01/23 0214

## 2023-08-01 NOTE — Progress Notes (Signed)
Orthopedic Tech Progress Note Patient Details:  HIBO FETTE 02-19-1971 [id number]  Ortho Devices Type of Ortho Device: Cotton web roll, Ace wrap, Sugartong splint Ortho Device/Splint Location: LUE Ortho Device/Splint Interventions: Ordered, Application, Adjustment   Post Interventions Patient Tolerated: Fair, Well Instructions Provided: Care of device  Donald Pore 08/01/2023, 2:21 AM
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
## 2023-08-02 ENCOUNTER — Other Ambulatory Visit (HOSPITAL_COMMUNITY): Payer: Self-pay

## 2023-08-02 ENCOUNTER — Encounter (HOSPITAL_COMMUNITY): Payer: Self-pay

## 2023-08-02 ENCOUNTER — Emergency Department (HOSPITAL_COMMUNITY)
Admission: EM | Admit: 2023-08-02 | Discharge: 2023-08-02 | Disposition: A | Discharge status: Home / Self Care | Payer: Medicaid Other | Attending: Emergency Medicine | Admitting: Emergency Medicine

## 2023-08-02 ENCOUNTER — Other Ambulatory Visit: Payer: Self-pay

## 2023-08-02 DIAGNOSIS — Z5900 Homelessness unspecified: Secondary | ICD-10-CM | POA: Diagnosis present

## 2023-08-02 DIAGNOSIS — J449 Chronic obstructive pulmonary disease, unspecified: Secondary | ICD-10-CM | POA: Insufficient documentation

## 2023-08-02 DIAGNOSIS — X398XXA Other exposure to forces of nature, initial encounter: Secondary | ICD-10-CM

## 2023-08-02 DIAGNOSIS — Z7951 Long term (current) use of inhaled steroids: Secondary | ICD-10-CM | POA: Diagnosis not present

## 2023-08-02 DIAGNOSIS — M25532 Pain in left wrist: Secondary | ICD-10-CM | POA: Insufficient documentation

## 2023-08-02 DIAGNOSIS — X31XXXA Exposure to excessive natural cold, initial encounter: Secondary | ICD-10-CM | POA: Insufficient documentation

## 2023-08-02 MED ORDER — MORPHINE SULFATE (PF) 2 MG/ML IV SOLN: 2.0000 mg | Freq: Once | INTRAVENOUS | Status: DC

## 2023-08-02 MED ORDER — OXYCODONE HCL 5 MG PO TABS
5.0000 mg | ORAL_TABLET | Freq: Four times a day (QID) | ORAL | 0 refills | Status: DC | PRN
  Filled 2023-08-02 – 2023-08-03 (×2): qty 10, 3d supply, fill #0

## 2023-08-02 MED ORDER — PREDNISONE 50 MG PO TABS
50.0000 mg | ORAL_TABLET | Freq: Every day | ORAL | 0 refills | Status: DC
  Filled 2023-08-02 – 2023-08-03 (×2): qty 5, 5d supply, fill #0

## 2023-08-02 MED ORDER — KETOROLAC TROMETHAMINE 15 MG/ML IJ SOLN
15.0000 mg | Freq: Once | INTRAMUSCULAR | Status: AC
  Administered 2023-08-02: 15 mg via INTRAMUSCULAR
  Filled 2023-08-02: qty 1
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
## 2023-08-02 NOTE — Discharge Instructions (Addendum)
I have provided resources for local shelters you can go to to stay out of the cold.  Follow up with orthopedics for your wrist fracture if you have not yet.

## 2023-08-02 NOTE — Discharge Instructions (Signed)
We recommend taking the oxycodone prescribed to you for management of your wrist pain.  Try to keep your left wrist elevated to help limit swelling and pain.  You have been given a sling to help promote elevation.  Follow-up with your hand surgeon as scheduled for definitive management of your broken bone.  We have provided a referral to general surgery given your chronic rectal prolapse.  Call to schedule an appointment to be seen for this issue.  To try and prevent constipation, which can worsen or aggravate rectal prolapse, we recommend over-the-counter stool softeners as well as the use of daily MiraLAX to promote soft stool.  If you are unable to reduce your rectal prolapse, promptly return to the ED for evaluation.

## 2023-08-02 NOTE — ED Notes (Signed)
Pt given food and water at this time.
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
## 2023-08-03 ENCOUNTER — Other Ambulatory Visit (HOSPITAL_COMMUNITY): Payer: Self-pay

## 2023-08-03 ENCOUNTER — Telehealth: Payer: Self-pay

## 2023-08-03 NOTE — Telephone Encounter (Signed)
Called Ms Cyphers to discuss medications, they have script at [facility] pharmacy and will fill if she can pick it up today. Left message for patient to call this RNCM back

## 2023-08-04 LAB — URINE CULTURE: Culture: >=100000 — AB

## 2023-08-05 ENCOUNTER — Other Ambulatory Visit (HOSPITAL_COMMUNITY): Payer: Self-pay

## 2023-08-05 ENCOUNTER — Telehealth (HOSPITAL_BASED_OUTPATIENT_CLINIC_OR_DEPARTMENT_OTHER): Payer: Self-pay | Admitting: *Deleted

## 2023-08-05 NOTE — Telephone Encounter (Signed)
Post ED Visit - Positive Culture Follow-up  Culture report reviewed by antimicrobial stewardship pharmacist: [facility] [x]  Daylene Posey Pharm.D. []  Celedonio Miyamoto, Pharm.D., BCPS AQ-ID []  Garvin Fila, Pharm.D., BCPS []  Georgina Pillion, Pharm.D., BCPS []  [place], [address].D., BCPS, AAHIVP []  Estella Husk, Pharm.D., BCPS, AAHIVP []  Lysle Pearl, PharmD, BCPS []  Phillips Climes, PharmD, BCPS []  Agapito Games, PharmD, BCPS []  Verlan Friends, PharmD []  Mervyn Gay, PharmD, BCPS []  Vinnie Level, PharmD  [facility] []  Len Childs, PharmD []  Greer Pickerel, PharmD []  Adalberto Cole, PharmD []  Perlie Gold, Rph []  Lonell Face) Jean Rosenthal, PharmD []  Earl Many, PharmD []  Junita Push, PharmD []  Dorna Leitz, PharmD []  Terrilee Files, PharmD []  Lynann Beaver, PharmD []  Keturah Barre, PharmD []  Loralee Pacas, PharmD []  Bernadene Person, PharmD   Positive urine culture Do not treat and no further patient follow-up is required at this time.  Glendora Score, MD  Virl Axe Talley 08/05/2023, 11:50 AM

## 2023-08-06 ENCOUNTER — Other Ambulatory Visit: Payer: Self-pay

## 2023-08-06 ENCOUNTER — Inpatient Hospital Stay (HOSPITAL_COMMUNITY)
Admission: EM | Admit: 2023-08-06 | Discharge: 2023-08-17 | DRG: 193 | Disposition: A | Discharge status: Home / Self Care | Payer: Medicaid Other | Attending: Student | Admitting: Student

## 2023-08-06 ENCOUNTER — Emergency Department (HOSPITAL_COMMUNITY): Payer: Medicaid Other

## 2023-08-06 ENCOUNTER — Encounter (HOSPITAL_COMMUNITY): Payer: Self-pay

## 2023-08-06 DIAGNOSIS — E43 Unspecified severe protein-calorie malnutrition: Secondary | ICD-10-CM | POA: Diagnosis present

## 2023-08-06 DIAGNOSIS — J189 Pneumonia, unspecified organism: Secondary | ICD-10-CM | POA: Diagnosis present

## 2023-08-06 DIAGNOSIS — R946 Abnormal results of thyroid function studies: Secondary | ICD-10-CM | POA: Diagnosis not present

## 2023-08-06 DIAGNOSIS — F319 Bipolar disorder, unspecified: Secondary | ICD-10-CM | POA: Diagnosis present

## 2023-08-06 DIAGNOSIS — Z1152 Encounter for screening for COVID-19: Secondary | ICD-10-CM | POA: Diagnosis not present

## 2023-08-06 DIAGNOSIS — J440 Chronic obstructive pulmonary disease with acute lower respiratory infection: Secondary | ICD-10-CM | POA: Diagnosis present

## 2023-08-06 DIAGNOSIS — F1410 Cocaine abuse, uncomplicated: Secondary | ICD-10-CM | POA: Diagnosis present

## 2023-08-06 DIAGNOSIS — J181 Lobar pneumonia, unspecified organism: Secondary | ICD-10-CM | POA: Diagnosis not present

## 2023-08-06 DIAGNOSIS — R0682 Tachypnea, not elsewhere classified: Secondary | ICD-10-CM | POA: Diagnosis present

## 2023-08-06 DIAGNOSIS — Z885 Allergy status to narcotic agent status: Secondary | ICD-10-CM

## 2023-08-06 DIAGNOSIS — R7881 Bacteremia: Secondary | ICD-10-CM | POA: Diagnosis present

## 2023-08-06 DIAGNOSIS — F410 Panic disorder [episodic paroxysmal anxiety] without agoraphobia: Secondary | ICD-10-CM | POA: Diagnosis present

## 2023-08-06 DIAGNOSIS — Z5901 Sheltered homelessness: Secondary | ICD-10-CM

## 2023-08-06 DIAGNOSIS — I38 Endocarditis, valve unspecified: Secondary | ICD-10-CM | POA: Diagnosis not present

## 2023-08-06 DIAGNOSIS — N8110 Cystocele, unspecified: Secondary | ICD-10-CM | POA: Diagnosis present

## 2023-08-06 DIAGNOSIS — W19XXXA Unspecified fall, initial encounter: Secondary | ICD-10-CM | POA: Diagnosis present

## 2023-08-06 DIAGNOSIS — Z765 Malingerer [conscious simulation]: Secondary | ICD-10-CM

## 2023-08-06 DIAGNOSIS — R0602 Shortness of breath: Principal | ICD-10-CM

## 2023-08-06 DIAGNOSIS — L299 Pruritus, unspecified: Secondary | ICD-10-CM | POA: Diagnosis present

## 2023-08-06 DIAGNOSIS — Z79899 Other long term (current) drug therapy: Secondary | ICD-10-CM | POA: Diagnosis not present

## 2023-08-06 DIAGNOSIS — J449 Chronic obstructive pulmonary disease, unspecified: Secondary | ICD-10-CM | POA: Diagnosis present

## 2023-08-06 DIAGNOSIS — S52502A Unspecified fracture of the lower end of left radius, initial encounter for closed fracture: Secondary | ICD-10-CM | POA: Diagnosis present

## 2023-08-06 DIAGNOSIS — Z681 Body mass index (BMI) 19 or less, adult: Secondary | ICD-10-CM | POA: Diagnosis not present

## 2023-08-06 DIAGNOSIS — F17210 Nicotine dependence, cigarettes, uncomplicated: Secondary | ICD-10-CM | POA: Diagnosis present

## 2023-08-06 DIAGNOSIS — F1990 Other psychoactive substance use, unspecified, uncomplicated: Secondary | ICD-10-CM | POA: Diagnosis not present

## 2023-08-06 DIAGNOSIS — Z91148 Patient's other noncompliance with medication regimen for other reason: Secondary | ICD-10-CM | POA: Diagnosis not present

## 2023-08-06 DIAGNOSIS — G8929 Other chronic pain: Secondary | ICD-10-CM | POA: Diagnosis present

## 2023-08-06 DIAGNOSIS — Z5900 Homelessness unspecified: Secondary | ICD-10-CM | POA: Diagnosis not present

## 2023-08-06 DIAGNOSIS — Z9049 Acquired absence of other specified parts of digestive tract: Secondary | ICD-10-CM

## 2023-08-06 DIAGNOSIS — R0981 Nasal congestion: Secondary | ICD-10-CM | POA: Diagnosis present

## 2023-08-06 DIAGNOSIS — K623 Rectal prolapse: Secondary | ICD-10-CM | POA: Diagnosis present

## 2023-08-06 DIAGNOSIS — F1210 Cannabis abuse, uncomplicated: Secondary | ICD-10-CM | POA: Diagnosis present

## 2023-08-06 DIAGNOSIS — J441 Chronic obstructive pulmonary disease with (acute) exacerbation: Secondary | ICD-10-CM | POA: Diagnosis present

## 2023-08-06 DIAGNOSIS — T402X5A Adverse effect of other opioids, initial encounter: Secondary | ICD-10-CM | POA: Diagnosis present

## 2023-08-06 DIAGNOSIS — R64 Cachexia: Secondary | ICD-10-CM | POA: Diagnosis present

## 2023-08-06 DIAGNOSIS — R5381 Other malaise: Secondary | ICD-10-CM | POA: Diagnosis present

## 2023-08-06 DIAGNOSIS — B957 Other staphylococcus as the cause of diseases classified elsewhere: Secondary | ICD-10-CM | POA: Diagnosis present

## 2023-08-06 DIAGNOSIS — R0902 Hypoxemia: Secondary | ICD-10-CM | POA: Diagnosis present

## 2023-08-06 DIAGNOSIS — S52502D Unspecified fracture of the lower end of left radius, subsequent encounter for closed fracture with routine healing: Secondary | ICD-10-CM | POA: Diagnosis not present

## 2023-08-06 DIAGNOSIS — M25561 Pain in right knee: Secondary | ICD-10-CM | POA: Diagnosis not present

## 2023-08-06 DIAGNOSIS — T380X5A Adverse effect of glucocorticoids and synthetic analogues, initial encounter: Secondary | ICD-10-CM | POA: Diagnosis not present

## 2023-08-06 LAB — CBC
HCT: 43.5 % (ref 36.0–46.0)
Hemoglobin: 14.6 g/dL (ref 12.0–15.0)
MCH: 34.1 pg — ABNORMAL HIGH (ref 26.0–34.0)
MCHC: 33.6 g/dL (ref 30.0–36.0)
MCV: 101.6 fL — ABNORMAL HIGH (ref 80.0–100.0)
Platelets: 299 10*3/uL (ref 150–400)
RBC: 4.28 MIL/uL (ref 3.87–5.11)
RDW: 13.8 % (ref 11.5–15.5)
WBC: 8.2 10*3/uL (ref 4.0–10.5)
nRBC: 0 % (ref 0.0–0.2)

## 2023-08-06 LAB — COMPREHENSIVE METABOLIC PANEL
ALT: 16 U/L (ref 0–44)
AST: 19 U/L (ref 15–41)
Albumin: 3.8 g/dL (ref 3.5–5.0)
Alkaline Phosphatase: 82 U/L (ref 38–126)
Anion gap: 9 (ref 5–15)
BUN: 8 mg/dL (ref 6–20)
CO2: 28 mmol/L (ref 22–32)
Calcium: 8.5 mg/dL — ABNORMAL LOW (ref 8.9–10.3)
Chloride: 101 mmol/L (ref 98–111)
Creatinine, Ser: 0.46 mg/dL (ref 0.44–1.00)
GFR, Estimated: >60 mL/min (ref >60)
Glucose, Bld: 100 mg/dL — ABNORMAL HIGH (ref 70–99)
Potassium: 3.6 mmol/L (ref 3.5–5.1)
Sodium: 138 mmol/L (ref 135–145)
Total Bilirubin: 1.1 mg/dL (ref <1.2)
Total Protein: 6.5 g/dL (ref 6.5–8.1)

## 2023-08-06 LAB — ETHANOL: Alcohol, Ethyl (B): <10 mg/dL (ref <10)

## 2023-08-06 MED ORDER — ARFORMOTEROL TARTRATE 15 MCG/2ML IN NEBU
15.0000 ug | INHALATION_SOLUTION | Freq: Two times a day (BID) | RESPIRATORY_TRACT | Status: DC
  Administered 2023-08-07 – 2023-08-11 (×9): 15 ug via RESPIRATORY_TRACT
  Filled 2023-08-06 (×10): qty 2

## 2023-08-06 MED ORDER — ENOXAPARIN SODIUM 40 MG/0.4ML IJ SOSY
40.0000 mg | PREFILLED_SYRINGE | INTRAMUSCULAR | Status: DC
  Administered 2023-08-06 – 2023-08-16 (×11): 40 mg via SUBCUTANEOUS
  Filled 2023-08-06 (×11): qty 0.4

## 2023-08-06 MED ORDER — GUAIFENESIN ER 600 MG PO TB12
600.0000 mg | ORAL_TABLET | Freq: Two times a day (BID) | ORAL | Status: DC
  Administered 2023-08-06 – 2023-08-17 (×22): 600 mg via ORAL
  Filled 2023-08-06 (×23): qty 1

## 2023-08-06 MED ORDER — HYDROXYZINE HCL 10 MG PO TABS: 10.0000 mg | ORAL_TABLET | Freq: Three times a day (TID) | ORAL | Status: DC | PRN

## 2023-08-06 MED ORDER — ONDANSETRON HCL 4 MG/2ML IJ SOLN: 4.0000 mg | Freq: Four times a day (QID) | INTRAMUSCULAR | Status: DC | PRN

## 2023-08-06 MED ORDER — MORPHINE SULFATE (PF) 4 MG/ML IV SOLN
4.0000 mg | Freq: Once | INTRAVENOUS | Status: AC
  Administered 2023-08-06: 4 mg via INTRAVENOUS
  Filled 2023-08-06: qty 1

## 2023-08-06 MED ORDER — BUDESONIDE 0.25 MG/2ML IN SUSP
0.2500 mg | Freq: Two times a day (BID) | RESPIRATORY_TRACT | Status: DC
  Administered 2023-08-07 – 2023-08-11 (×9): 0.25 mg via RESPIRATORY_TRACT
  Filled 2023-08-06 (×10): qty 2

## 2023-08-06 MED ORDER — DIPHENHYDRAMINE HCL 50 MG/ML IJ SOLN
25.0000 mg | Freq: Once | INTRAMUSCULAR | Status: AC
  Administered 2023-08-06: 25 mg via INTRAVENOUS
  Filled 2023-08-06: qty 1

## 2023-08-06 MED ORDER — SODIUM CHLORIDE 0.9 % IV SOLN
2.0000 g | INTRAVENOUS | Status: AC
  Administered 2023-08-07 – 2023-08-11 (×5): 2 g via INTRAVENOUS
  Filled 2023-08-06 (×5): qty 20

## 2023-08-06 MED ORDER — HYDROXYZINE HCL 25 MG PO TABS
25.0000 mg | ORAL_TABLET | Freq: Three times a day (TID) | ORAL | Status: DC | PRN
  Administered 2023-08-08 – 2023-08-09 (×3): 25 mg via ORAL
  Filled 2023-08-06 (×3): qty 1

## 2023-08-06 MED ORDER — ADULT MULTIVITAMIN W/MINERALS CH
1.0000 | ORAL_TABLET | Freq: Every day | ORAL | Status: DC
  Administered 2023-08-06 – 2023-08-17 (×12): 1 via ORAL
  Filled 2023-08-06 (×12): qty 1

## 2023-08-06 MED ORDER — THIAMINE HCL 100 MG/ML IJ SOLN
100.0000 mg | Freq: Every day | INTRAMUSCULAR | Status: DC
  Filled 2023-08-06 (×2): qty 2

## 2023-08-06 MED ORDER — LORAZEPAM 2 MG/ML IJ SOLN
1.0000 mg | INTRAMUSCULAR | Status: AC | PRN
  Administered 2023-08-06: 2 mg via INTRAVENOUS
  Filled 2023-08-06: qty 1

## 2023-08-06 MED ORDER — SODIUM CHLORIDE 0.9 % IV SOLN: 500.0000 mg | INTRAVENOUS | Status: DC

## 2023-08-06 MED ORDER — LORAZEPAM 1 MG PO TABS
1.0000 mg | ORAL_TABLET | ORAL | Status: AC | PRN
Start: 2023-08-06 — End: 2023-08-09
  Administered 2023-08-09 (×2): 1 mg via ORAL
  Filled 2023-08-06 (×2): qty 1

## 2023-08-06 MED ORDER — LACTATED RINGERS IV BOLUS
1000.0000 mL | Freq: Once | INTRAVENOUS | Status: AC
  Administered 2023-08-06: 1000 mL via INTRAVENOUS

## 2023-08-06 MED ORDER — SODIUM CHLORIDE 0.9 % IV SOLN
500.0000 mg | Freq: Once | INTRAVENOUS | Status: AC
  Administered 2023-08-06: 500 mg via INTRAVENOUS
  Filled 2023-08-06: qty 5

## 2023-08-06 MED ORDER — ACETAMINOPHEN 650 MG RE SUPP: 650.0000 mg | Freq: Four times a day (QID) | RECTAL | Status: DC | PRN

## 2023-08-06 MED ORDER — THIAMINE MONONITRATE 100 MG PO TABS
100.0000 mg | ORAL_TABLET | Freq: Every day | ORAL | Status: DC
Start: 2023-08-06 — End: 2023-08-17
  Administered 2023-08-06 – 2023-08-17 (×12): 100 mg via ORAL
  Filled 2023-08-06 (×12): qty 1

## 2023-08-06 MED ORDER — SENNOSIDES-DOCUSATE SODIUM 8.6-50 MG PO TABS: 1.0000 | ORAL_TABLET | Freq: Every evening | ORAL | Status: DC | PRN

## 2023-08-06 MED ORDER — METHYLPREDNISOLONE SODIUM SUCC 40 MG IJ SOLR
40.0000 mg | Freq: Two times a day (BID) | INTRAMUSCULAR | Status: DC
Start: 2023-08-07 — End: 2023-08-11
  Administered 2023-08-07 – 2023-08-11 (×9): 40 mg via INTRAVENOUS
  Filled 2023-08-06 (×9): qty 1

## 2023-08-06 MED ORDER — FOLIC ACID 1 MG PO TABS
1.0000 mg | ORAL_TABLET | Freq: Every day | ORAL | Status: DC
  Administered 2023-08-06 – 2023-08-17 (×12): 1 mg via ORAL
  Filled 2023-08-06 (×12): qty 1

## 2023-08-06 MED ORDER — SODIUM CHLORIDE 0.9 % IV SOLN
1.0000 g | Freq: Once | INTRAVENOUS | Status: AC
  Administered 2023-08-06: 1 g via INTRAVENOUS
  Filled 2023-08-06: qty 10

## 2023-08-06 MED ORDER — ACETAMINOPHEN 325 MG PO TABS
650.0000 mg | ORAL_TABLET | Freq: Four times a day (QID) | ORAL | Status: DC | PRN
Start: 2023-08-06 — End: 2023-08-08
  Administered 2023-08-07 (×2): 650 mg via ORAL
  Filled 2023-08-06 (×2): qty 2

## 2023-08-06 MED ORDER — ONDANSETRON HCL 4 MG PO TABS
4.0000 mg | ORAL_TABLET | Freq: Four times a day (QID) | ORAL | Status: DC | PRN
  Administered 2023-08-11 – 2023-08-14 (×2): 4 mg via ORAL
  Filled 2023-08-06 (×3): qty 1

## 2023-08-06 MED ORDER — TRAZODONE HCL 50 MG PO TABS
50.0000 mg | ORAL_TABLET | Freq: Every evening | ORAL | Status: DC | PRN
  Administered 2023-08-09 – 2023-08-14 (×6): 50 mg via ORAL
  Filled 2023-08-06 (×6): qty 1

## 2023-08-06 MED ORDER — NICOTINE 21 MG/24HR TD PT24
21.0000 mg | MEDICATED_PATCH | Freq: Every day | TRANSDERMAL | Status: DC
  Administered 2023-08-06 – 2023-08-17 (×12): 21 mg via TRANSDERMAL
  Filled 2023-08-06 (×12): qty 1

## 2023-08-06 MED ORDER — IPRATROPIUM-ALBUTEROL 0.5-2.5 (3) MG/3ML IN SOLN
3.0000 mL | Freq: Four times a day (QID) | RESPIRATORY_TRACT | Status: DC | PRN
  Administered 2023-08-07 – 2023-08-10 (×2): 3 mL via RESPIRATORY_TRACT
  Filled 2023-08-06 (×2): qty 3

## 2023-08-06 NOTE — H&P (Signed)
History and Physical    Stacy Bolton:[id number] DOB: [birth year] DOA: 08/06/2023  PCP: Patient, No Pcp Per  Patient coming from: [facility] [employer] [facility]  I have personally briefly reviewed patient's old medical records in [facility]  Chief Complaint: Cough, shortness of breath  HPI: Stacy Bolton is a 52 y.o. female with medical history significant for COPD, bipolar disorder, anxiety, substance use disorder (EtOH, cocaine, THC, tobacco), homelessness who presented to the ED for evaluation of shortness of breath.  Patient presenting with acute onset of shortness of breath associated with cough productive of yellow sputum.  She reports subjective fevers, chills, diaphoresis prior to arrival.  She reports ongoing tobacco use, smokes about 1 pack/day.  She reports smoking cocaine last night.  She reports significant alcohol use, last drink was last night.  She denies injection drug use, methamphetamine, or opioid abuse.  She says she is currently staying at the [facility].  ED Course  Labs/Imaging on admission: I have personally reviewed following labs and imaging studies.  Initial vitals showed BP 129/86, pulse 99, RR 18, temp 98.4 F, SpO2 96% on room air.  Labs showed WBC 8.2, hemoglobin 14.6, platelets 298,000, sodium 138, potassium 3.6, bicarb 28, BUN 8, creatinine 0.86, serum glucose 100, LFTs within normal limits.  Blood cultures in process.  2 view chest x-ray showed new right middle lobe pneumonia and emphysematous changes.  Patient was given IV morphine 4 mg, IV ceftriaxone and azithromycin, 1 L LR.  The hospitalist service was consulted to admit for further evaluation and management.  Review of Systems: All systems reviewed and are negative except as documented in history of present illness above.   Past Medical History:  Diagnosis Date   Anxiety    Asthma    COPD (chronic obstructive pulmonary disease)  (HCC)    Dyspnea    Endometriosis    Seizure (HCC)     Past Surgical History:  Procedure Laterality Date   CHOLECYSTECTOMY      Social History:  reports that she has been smoking cigarettes. She has a 45 pack-year smoking history. She does not have any smokeless tobacco history on file. She reports current alcohol use. She reports current drug use. Drugs: Cocaine and Marijuana.  Allergies  Allergen Reactions   Hydrocodone-Acetaminophen     REACTION: itch   Hydromorphone Hcl     REACTION: itching   Oxycodone-Acetaminophen Itching    History reviewed. No pertinent family history.   Prior to Admission medications   Medication Sig Start Date End Date Taking? Authorizing Provider  albuterol (PROVENTIL HFA) 108 (90 Base) MCG/ACT inhaler Inhale 2 puffs into the lungs every 6 (six) hours as needed for wheezing or shortness of breath. 05/03/23   Marguerita Merles Latif, DO  albuterol (VENTOLIN HFA) 108 (90 Base) MCG/ACT inhaler Inhale 1-2 puffs into the lungs every 6 (six) hours as needed for wheezing or shortness of breath. 07/30/23   Rancour, Jeannett Senior, MD  cetirizine (ZYRTEC ALLERGY) 10 MG tablet Take 1 tablet (10 mg total) by mouth daily for 10 days. 05/03/23 05/13/23  Marguerita Merles Latif, DO  divalproex (DEPAKOTE ER) 500 MG 24 hr tablet Take 1 tablet (500 mg total) by mouth 2 (two) times daily. 05/03/23   Marguerita Merles Latif, DO  feeding supplement (ENSURE ENLIVE / ENSURE PLUS) LIQD Take 237 mLs by mouth 3 (three) times daily between meals. 05/03/23   Marguerita Merles Latif, DO  FLUoxetine (PROZAC) 20 MG capsule Take  1 capsule (20 mg total) by mouth daily. 05/03/23 08/01/23  Marguerita Merles Latif, DO  fluticasone (FLONASE) 50 MCG/ACT nasal spray Place 2 sprays into both nostrils daily. 05/03/23   Marguerita Merles Latif, DO  magnesium citrate SOLN Take 296 mLs (1 Bottle total) by mouth daily as needed for severe constipation. 02/08/23   Clapacs, Jackquline Denmark, MD  Mouthwashes (MOUTH RINSE) LIQD solution 15 mLs by Mouth  Rinse route 4 (four) times daily. 05/03/23   Marguerita Merles Latif, DO  Multiple Vitamin (MULTIVITAMIN WITH MINERALS) TABS tablet Take 1 tablet by mouth daily. 05/03/23   Sheikh, Omair Latif, DO  nicotine (NICODERM CQ - DOSED IN MG/24 HOURS) 21 mg/24hr patch Place 1 patch (21 mg total) onto the skin daily. 05/03/23   Marguerita Merles Latif, DO  nicotine polacrilex (NICORETTE) 2 MG gum Take 1 each (2 mg total) by mouth as needed for smoking cessation. 05/03/23   Sheikh, Omair Latif, DO  ondansetron (ZOFRAN) 4 MG tablet Take 1 tablet (4 mg total) by mouth every 6 (six) hours as needed for nausea. 05/03/23   Sheikh, Kateri Mc Latif, DO  oxyCODONE (ROXICODONE) 5 MG immediate release tablet Take 1 tablet (5 mg total) by mouth every 6 (six) hours as needed for severe pain (pain score 7-10). 08/02/23   Antony Madura, PA-C  predniSONE (DELTASONE) 50 MG tablet Take 1 tablet (50 mg total) by mouth daily for 5 days. 08/02/23 08/08/23  Antony Madura, PA-C  psyllium (HYDROCIL/METAMUCIL) 95 % PACK Take 1 packet by mouth daily. 02/09/23   Clapacs, Jackquline Denmark, MD  risperiDONE (RISPERDAL) 2 MG tablet Take 1 tablet (2 mg total) by mouth at bedtime. 05/03/23   Marguerita Merles Latif, DO  sodium chloride (OCEAN) 0.65 % SOLN nasal spray Place 1 spray into both nostrils as needed for congestion (nose irritation). 05/03/23   Marguerita Merles Latif, DO  traZODone (DESYREL) 50 MG tablet Take 1 tablet (50 mg total) by mouth at bedtime as needed for sleep. 05/03/23   Merlene Laughter, DO    Physical Exam: Vitals:   08/06/23 1227 08/06/23 1719 08/06/23 1927 08/06/23 1930  BP: 129/86 112/81 111/78 109/73  Pulse: 97 (!) 104 (!) 105 (!) 103  Resp: 18 16 13 17   Temp: 98.4 F (36.9 C) 98.7 F (37.1 C)    TempSrc: Oral Oral    SpO2: 96% 93% 97% 94%  Weight:      Height:       Constitutional: Resting in bed, NAD, calm, comfortable Eyes: EOMI, lids and conjunctivae normal ENMT: Mucous membranes are moist. Posterior pharynx clear of any exudate or  lesions.Normal dentition.  Neck: normal, supple, no masses. Respiratory: Expiratory wheezing bilaterally. Normal respiratory effort. No accessory muscle use.  Cardiovascular: Regular rate and rhythm, no murmurs / rubs / gallops. No extremity edema. 2+ pedal pulses. Abdomen: no tenderness, no masses palpated.  Musculoskeletal: Left wrist in splint.  No clubbing / cyanosis. No joint deformity upper and lower extremities.  Skin: no rashes, lesions, ulcers. No induration Neurologic: Sensation intact. Strength 5/5 in all 4.  Psychiatric: Alert and oriented x 3. Normal mood.   EKG: Personally reviewed. Sinus rhythm, rate 102, no acute ischemic changes.  Rate is faster when compared to prior.  Assessment/Plan Principal Problem:   Community acquired pneumonia of right middle lobe of lung Active Problems:   COPD (chronic obstructive pulmonary disease) (HCC)   Bipolar disorder (HCC)   Substance use disorder   Closed fracture of distal end of left radius  Stacy Bolton is a 52 y.o. female with medical history significant for COPD, bipolar disorder, anxiety, substance use disorder (EtOH, cocaine, THC, tobacco), homelessness who is admitted with RML pneumonia and COPD exacerbation.  Assessment and Plan: Community-acquired pneumonia of the right middle lobe: CXR consistent with right middle lobe pneumonia.  Patient was hypoxic to mid 80s on room air prior to arrival with EMS but now saturating well on room air. -Continue IV ceftriaxone and azithromycin -Follow blood cultures -Check strep pneumonia and Legionella urinary antigen, COVID/influenza/RSV panel -Supplemental O2 as needed -IS, FV, mucolytics  COPD with acute exacerbation: Still wheezing at time of admission.  Reports ongoing tobacco use. -IV Solu-Medrol 40 mg twice daily -Scheduled Brovana/Pulmicort with DuoNebs as needed -Antibiotics as above -Supplement O2 as needed  Substance use disorder: Patient reports ongoing  tobacco use, smokes about 1 pack/day.  Reports smoking cocaine night prior to admission.  Also reports ongoing significant alcohol use although does not quantify volume.  Does not appear to be withdrawing at time of admission but certainly at risk. -Monitor on CIWA protocol with Ativan as needed -Nicotine patch provided -Continue thiamine, folate, MVM -Check UDS and ethanol levels  Bipolar disorder/anxiety: Seen by psychiatry on last admission and recommended discontinuation of Depakote and paroxetine due to medication noncompliance and ongoing substance use. -Continue Atarax 25 mg 3 times daily as needed -Trazodone 50 mg nightly as needed  Distal left radial fracture: Occurring after fall on 12/2.  Seen by orthopedics Dr. Alena Bills and scheduled for ORIF 12/19.   DVT prophylaxis: enoxaparin (LOVENOX) injection 40 mg Start: 08/06/23 2200 Code Status: Full code, confirmed with patient on admission Family Communication: Discussed with patient, she has discussed with her close contact.  She says she has no close family. Disposition Plan: Pending clinical progress Consults called: None Severity of Illness: The appropriate patient status for this patient is INPATIENT. Inpatient status is judged to be reasonable and necessary in order to provide the required intensity of service to ensure the patient's safety. The patient's presenting symptoms, physical exam findings, and initial radiographic and laboratory data in the context of their chronic comorbidities is felt to place them at high risk for further clinical deterioration. Furthermore, it is not anticipated that the patient will be medically stable for discharge from the hospital within 2 midnights of admission.   * I certify that at the point of admission it is my clinical judgment that the patient will require inpatient hospital care spanning beyond 2 midnights from the point of admission due to high intensity of service, high risk for further  deterioration and high frequency of surveillance required.Darreld Mclean MD [facility]  If 7PM-7AM, please contact night-coverage [email or web address]  08/06/2023, 8:02 PM

## 2023-08-06 NOTE — ED Provider Notes (Signed)
[place] EMERGENCY DEPARTMENT AT [facility] Provider Note   CSN: [id number] Arrival date & time: 08/06/23  1213     History Chief Complaint  Patient presents with   Shortness of Breath    HPI Stacy Bolton is a 52 y.o. female presenting for a myriad of symptoms.  Shortness of breath fatigue malaise cough, acute on chronic pain all throughout her body.  History of alcoholism, substance use disorder.   Patient's recorded medical, surgical, social, medication list and allergies were reviewed in the Snapshot window as part of the initial history.   Review of Systems   Review of Systems  Constitutional:  Negative for chills and fever.  HENT:  Negative for ear pain and sore throat.   Eyes:  Negative for pain and visual disturbance.  Respiratory:  Negative for cough and shortness of breath.   Cardiovascular:  Negative for chest pain and palpitations.  Gastrointestinal:  Negative for abdominal pain and vomiting.  Genitourinary:  Negative for dysuria and hematuria.  Musculoskeletal:  Negative for arthralgias and back pain.  Skin:  Negative for color change and rash.  Neurological:  Negative for seizures and syncope.  All other systems reviewed and are negative.   Physical Exam Updated Vital Signs BP 99/68 (BP Location: Right Arm)   Pulse 90   Temp 98 F (36.7 C) (Oral)   Resp 16   Ht 5\' 5"  (1.651 m)   Wt 49.1 kg   LMP 10/13/2011   SpO2 93%   BMI 18.01 kg/m  Physical Exam Vitals and nursing note reviewed.  Constitutional:      General: She is not in acute distress.    Appearance: She is well-developed.  HENT:     Head: Normocephalic and atraumatic.  Eyes:     Conjunctiva/sclera: Conjunctivae normal.  Cardiovascular:     Rate and Rhythm: Normal rate and regular rhythm.     Heart sounds: No murmur heard. Pulmonary:     Effort: Pulmonary effort is normal. No respiratory distress.     Breath sounds: Normal breath sounds.  Abdominal:      Palpations: Abdomen is soft.     Tenderness: There is no abdominal tenderness.  Musculoskeletal:        General: No swelling.     Cervical back: Neck supple.  Skin:    General: Skin is warm and dry.     Capillary Refill: Capillary refill takes less than 2 seconds.  Neurological:     Mental Status: She is alert.  Psychiatric:        Mood and Affect: Mood normal.      ED Course/ Medical Decision Making/ A&P    Procedures Procedures   Medications Ordered in ED Medications  enoxaparin (LOVENOX) injection 40 mg (40 mg Subcutaneous Given 08/06/23 2149)  cefTRIAXone (ROCEPHIN) 2 g in sodium chloride 0.9 % 100 mL IVPB (has no administration in time range)  azithromycin (ZITHROMAX) 500 mg in sodium chloride 0.9 % 250 mL IVPB (has no administration in time range)  acetaminophen (TYLENOL) tablet 650 mg (has no administration in time range)    Or  acetaminophen (TYLENOL) suppository 650 mg (has no administration in time range)  ondansetron (ZOFRAN) tablet 4 mg (has no administration in time range)    Or  ondansetron (ZOFRAN) injection 4 mg (has no administration in time range)  senna-docusate (Senokot-S) tablet 1 tablet (has no administration in time range)  nicotine (NICODERM CQ - dosed in mg/24 hours) patch 21 mg (21  mg Transdermal Patch Applied 08/06/23 2148)  guaiFENesin (MUCINEX) 12 hr tablet 600 mg (600 mg Oral Given 08/06/23 2147)  methylPREDNISolone sodium succinate (SOLU-MEDROL) 40 mg/mL injection 40 mg (has no administration in time range)  arformoterol (BROVANA) nebulizer solution 15 mcg (15 mcg Nebulization Not Given 08/06/23 2217)  budesonide (PULMICORT) nebulizer solution 0.25 mg (0.25 mg Nebulization Not Given 08/06/23 2218)  ipratropium-albuterol (DUONEB) 0.5-2.5 (3) MG/3ML nebulizer solution 3 mL (has no administration in time range)  LORazepam (ATIVAN) tablet 1-4 mg ( Oral See Alternative 08/06/23 2152)    Or  LORazepam (ATIVAN) injection 1-4 mg (2 mg Intravenous Given  08/06/23 2152)  thiamine (VITAMIN B1) tablet 100 mg (100 mg Oral Given 08/06/23 2148)    Or  thiamine (VITAMIN B1) injection 100 mg ( Intravenous See Alternative 08/06/23 2148)  folic acid (FOLVITE) tablet 1 mg (1 mg Oral Given 08/06/23 2147)  multivitamin with minerals tablet 1 tablet (1 tablet Oral Given 08/06/23 2148)  traZODone (DESYREL) tablet 50 mg (has no administration in time range)  hydrOXYzine (ATARAX) tablet 25 mg (has no administration in time range)  lactated ringers bolus 1,000 mL (0 mLs Intravenous Stopped 08/06/23 1928)  morphine (PF) 4 MG/ML injection 4 mg (4 mg Intravenous Given 08/06/23 1813)  cefTRIAXone (ROCEPHIN) 1 g in sodium chloride 0.9 % 100 mL IVPB (0 g Intravenous Stopped 08/06/23 1928)  azithromycin (ZITHROMAX) 500 mg in sodium chloride 0.9 % 250 mL IVPB (0 mg Intravenous Stopped 08/06/23 1928)  diphenhydrAMINE (BENADRYL) injection 25 mg (25 mg Intravenous Given 08/06/23 1813)    Medical Decision Making:    Stacy Bolton is a 52 y.o. female who presented to the ED today with SOB detailed above.     Complete initial physical exam performed, notably the patient  was hemodynamically stable no acute distress.  Very tachypneic during conversation.      Reviewed and confirmed nursing documentation for past medical history, family history, social history.    Initial Assessment:   Patient's history of present illness physical exam findings most consistent with developing pneumonia process. Given her history of COPD severe pain I believe this is most likely source. Consulted medicine after starting azithromycin and ceftriaxone and they were in agreement with observation care overnight tonight given her indigent status, alcoholic risk factors.  Disposition:   Based on the above findings, I believe this patient is stable for admission.    Patient/family educated about specific findings on our evaluation and explained exact reasons for admission.   Patient/family educated about clinical situation and time was allowed to answer questions.   Admission team communicated with and agreed with need for admission. Patient admitted. Patient ready to move at this time.     Emergency Department Medication Summary:   Medications  enoxaparin (LOVENOX) injection 40 mg (40 mg Subcutaneous Given 08/06/23 2149)  cefTRIAXone (ROCEPHIN) 2 g in sodium chloride 0.9 % 100 mL IVPB (has no administration in time range)  azithromycin (ZITHROMAX) 500 mg in sodium chloride 0.9 % 250 mL IVPB (has no administration in time range)  acetaminophen (TYLENOL) tablet 650 mg (has no administration in time range)    Or  acetaminophen (TYLENOL) suppository 650 mg (has no administration in time range)  ondansetron (ZOFRAN) tablet 4 mg (has no administration in time range)    Or  ondansetron (ZOFRAN) injection 4 mg (has no administration in time range)  senna-docusate (Senokot-S) tablet 1 tablet (has no administration in time range)  nicotine (NICODERM CQ - dosed in mg/24 hours)  patch 21 mg (21 mg Transdermal Patch Applied 08/06/23 2148)  guaiFENesin (MUCINEX) 12 hr tablet 600 mg (600 mg Oral Given 08/06/23 2147)  methylPREDNISolone sodium succinate (SOLU-MEDROL) 40 mg/mL injection 40 mg (has no administration in time range)  arformoterol (BROVANA) nebulizer solution 15 mcg (15 mcg Nebulization Not Given 08/06/23 2217)  budesonide (PULMICORT) nebulizer solution 0.25 mg (0.25 mg Nebulization Not Given 08/06/23 2218)  ipratropium-albuterol (DUONEB) 0.5-2.5 (3) MG/3ML nebulizer solution 3 mL (has no administration in time range)  LORazepam (ATIVAN) tablet 1-4 mg ( Oral See Alternative 08/06/23 2152)    Or  LORazepam (ATIVAN) injection 1-4 mg (2 mg Intravenous Given 08/06/23 2152)  thiamine (VITAMIN B1) tablet 100 mg (100 mg Oral Given 08/06/23 2148)    Or  thiamine (VITAMIN B1) injection 100 mg ( Intravenous See Alternative 08/06/23 2148)  folic acid (FOLVITE) tablet 1 mg (1  mg Oral Given 08/06/23 2147)  multivitamin with minerals tablet 1 tablet (1 tablet Oral Given 08/06/23 2148)  traZODone (DESYREL) tablet 50 mg (has no administration in time range)  hydrOXYzine (ATARAX) tablet 25 mg (has no administration in time range)  lactated ringers bolus 1,000 mL (0 mLs Intravenous Stopped 08/06/23 1928)  morphine (PF) 4 MG/ML injection 4 mg (4 mg Intravenous Given 08/06/23 1813)  cefTRIAXone (ROCEPHIN) 1 g in sodium chloride 0.9 % 100 mL IVPB (0 g Intravenous Stopped 08/06/23 1928)  azithromycin (ZITHROMAX) 500 mg in sodium chloride 0.9 % 250 mL IVPB (0 mg Intravenous Stopped 08/06/23 1928)  diphenhydrAMINE (BENADRYL) injection 25 mg (25 mg Intravenous Given 08/06/23 1813)        Clinical Impression:  1. Shortness of breath      Admit   Final Clinical Impression(s) / ED Diagnoses Final diagnoses:  Shortness of breath    Rx / DC Orders ED Discharge Orders     None         Glyn Ade, MD 08/07/23 0003

## 2023-08-06 NOTE — Hospital Course (Signed)
Stacy Bolton is a 52 y.o. female with medical history significant for COPD, bipolar disorder, anxiety, substance use disorder (EtOH, cocaine, THC, tobacco), homelessness who is admitted with RML pneumonia and COPD exacerbation.

## 2023-08-06 NOTE — ED Notes (Signed)
ED TO INPATIENT HANDOFF REPORT  ED Nurse Name and Phone #:  Leatrice Jewels [phone]  S Name/Age/Gender Stacy Bolton 52 y.o. female Room/Bed: WA07/WA07  Code Status   Code Status: Prior  Home/SNF/Other Boarding Home Patient oriented to: self, place, time, and situation Is this baseline? Yes   Triage Complete: Triage complete  Chief Complaint Community acquired pneumonia of right middle lobe of lung [J18.9]  Triage Note Patient BIB EMS for SOB x "a couple hours." Hx of COPD. Patient took home duo neb and EMS gave 125 Solumedrol and another duo neb. Patient feels better.    Allergies Allergies  Allergen Reactions   Hydrocodone-Acetaminophen     REACTION: itch   Hydromorphone Hcl     REACTION: itching   Oxycodone-Acetaminophen Itching    Level of Care/Admitting Diagnosis ED Disposition     ED Disposition  Admit   Condition  --   Comment  Hospital Area: [facility] [[id number]]  Level of Care: Med-Surg [16]  May admit patient to [facility] or [facility] if equivalent level of care is available:: No  Covid Evaluation: Symptomatic Person Under Investigation (PUI) or recent exposure (last 10 days) *Testing Required*  Diagnosis: Community acquired pneumonia of right middle lobe of lung [[id number]]  Admitting Physician: Charlsie Quest [[id number]]  Attending Physician: Charlsie Quest [[id number]]  Certification:: I certify this patient will need inpatient services for at least 2 midnights  Expected Medical Readiness: 08/08/2023          B Medical/Surgery History Past Medical History:  Diagnosis Date   Anxiety    Asthma    COPD (chronic obstructive pulmonary disease) (HCC)    Dyspnea    Endometriosis    Seizure (HCC)    Past Surgical History:  Procedure Laterality Date   CHOLECYSTECTOMY       A IV Location/Drains/Wounds Patient Lines/Drains/Airways Status     Active Line/Drains/Airways     Name Placement date Placement time Site  Days   Peripheral IV 08/06/23 18 G Anterior;Right Forearm 08/06/23  1225  Forearm  less than 1            Intake/Output Last 24 hours No intake or output data in the 24 hours ending 08/06/23 1932  Labs/Imaging Results for orders placed or performed during the hospital encounter of 08/06/23 (from the past 48 hour(s))  CBC     Status: Abnormal   Collection Time: 08/06/23 12:49 PM  Result Value Ref Range   WBC 8.2 4.0 - 10.5 K/uL   RBC 4.28 3.87 - 5.11 MIL/uL   Hemoglobin 14.6 12.0 - 15.0 g/dL   HCT 86.5 78.4 - 69.6 %   MCV 101.6 (H) 80.0 - 100.0 fL   MCH 34.1 (H) 26.0 - 34.0 pg   MCHC 33.6 30.0 - 36.0 g/dL   RDW 29.5 28.4 - 13.2 %   Platelets 299 150 - 400 K/uL   nRBC 0.0 0.0 - 0.2 %    Comment: Performed at [facility], [address] [place], [place] [address]  Comprehensive metabolic panel     Status: Abnormal   Collection Time: 08/06/23 12:49 PM  Result Value Ref Range   Sodium 138 135 - 145 mmol/L   Potassium 3.6 3.5 - 5.1 mmol/L   Chloride 101 98 - 111 mmol/L   CO2 28 22 - 32 mmol/L   Glucose, Bld 100 (H) 70 - 99 mg/dL    Comment: Glucose reference range applies only to samples  taken after fasting for at least 8 hours.   BUN 8 6 - 20 mg/dL   Creatinine, Ser 6.96 0.44 - 1.00 mg/dL   Calcium 8.5 (L) 8.9 - 10.3 mg/dL   Total Protein 6.5 6.5 - 8.1 g/dL   Albumin 3.8 3.5 - 5.0 g/dL   AST 19 15 - 41 U/L   ALT 16 0 - 44 U/L   Alkaline Phosphatase 82 38 - 126 U/L   Total Bilirubin 1.1 <1.2 mg/dL   GFR, Estimated >29 >52 mL/min    Comment: (NOTE) Calculated using the CKD-EPI Creatinine Equation (2021)    Anion gap 9 5 - 15    Comment: Performed at [facility], [address] [place], [place] [address]   DG Chest 2 View  Result Date: 08/06/2023 CLINICAL DATA:  Shortness of breath for a few hours. EXAM: CHEST - 2 VIEW COMPARISON:  Chest x-ray dated July 29, 2023. FINDINGS: The heart size and mediastinal contours are within  normal limits. Normal pulmonary vascularity. The lungs remain hyperinflated with emphysematous changes. New hazy airspace disease in the right middle lobe. Bilateral nipple shadows noted. No pleural effusion or pneumothorax. IMPRESSION: 1. New right middle lobe pneumonia. Followup PA and lateral chest X-ray is recommended in 3-4 weeks following trial of antibiotic therapy to ensure resolution. 2. COPD. Electronically Signed   By: Obie Dredge M.D.   On: 08/06/2023 16:03    Pending Labs Unresulted Labs (From admission, onward)     Start     Ordered   08/06/23 1749  Blood culture (routine x 2)  BLOOD CULTURE X 2,   R      08/06/23 1748   Pending  Rapid urine drug screen (hospital performed)  Once,   R        Pending   Pending  Ethanol  Once,   R        Pending   Pending  Resp panel by RT-PCR (RSV, Flu A&B, Covid) Anterior Nasal Swab  (Tier 2 - SARS Coronavirus 2 by RT PCR (hospital order, performed in [facility] hospital lab) *cepheid single result test*)  Once,   R        Pending   Pending  Strep pneumoniae urinary antigen  (COPD / Pneumonia / Cellulitis / Lower Extremity Wound)  Once,   R        Pending   Pending  Legionella Pneumophila Serogp 1 Ur Ag  (COPD / Pneumonia / Cellulitis / Lower Extremity Wound)  Once,   R        Pending   Pending  Expectorated Sputum Assessment w Gram Stain, Rflx to Resp Cult  (COPD / Pneumonia / Cellulitis / Lower Extremity Wound)  Once,   R        Pending   Pending  Basic metabolic panel  Tomorrow morning,   R        Pending   Pending  CBC  Tomorrow morning,   R        Pending            Vitals/Pain Today's Vitals   08/06/23 1226 08/06/23 1227 08/06/23 1719 08/06/23 1930  BP:  129/86 112/81   Pulse:  97 (!) 104   Resp:  18 16   Temp:  98.4 F (36.9 C) 98.7 F (37.1 C)   TempSrc:  Oral Oral   SpO2:  96% 93%   Weight: 49.1 kg     Height: 5\' 5"  (1.651 m)  PainSc:    3     Isolation Precautions No active  isolations  Medications Medications  lactated ringers bolus 1,000 mL (0 mLs Intravenous Stopped 08/06/23 1928)  morphine (PF) 4 MG/ML injection 4 mg (4 mg Intravenous Given 08/06/23 1813)  cefTRIAXone (ROCEPHIN) 1 g in sodium chloride 0.9 % 100 mL IVPB (0 g Intravenous Stopped 08/06/23 1928)  azithromycin (ZITHROMAX) 500 mg in sodium chloride 0.9 % 250 mL IVPB (0 mg Intravenous Stopped 08/06/23 1928)  diphenhydrAMINE (BENADRYL) injection 25 mg (25 mg Intravenous Given 08/06/23 1813)    Mobility walks     Focused Assessments     R Recommendations: See Admitting Provider Note  Report given to:   Additional Notes:  Last used cocaine 08/05/23, last drink 08/05/23

## 2023-08-06 NOTE — ED Triage Notes (Signed)
Patient BIB EMS for SOB x "a couple hours." Hx of COPD. Patient took home duo neb and EMS gave 125 Solumedrol and another duo neb. Patient feels better.

## 2023-08-06 NOTE — ED Provider Triage Note (Signed)
Emergency Medicine Provider Triage Evaluation Note  Stacy Bolton , a 52 y.o. female  was evaluated in triage.  Pt complains of multiple complaints. Report severe pain to left wrist from a fall a few days prior and she was diagnosed with a broken wrist.  Despite splinting and pain medications her pain has persists.  Denies numbness to L wrist/hand.  Also report having rectal prolapse with bleeding, worsen since last night.  Also having sob and panic attack today, improves with treatment by EMS  Review of Systems  Positive: As above Negative: As above  Physical Exam  BP 129/86 (BP Location: Right Arm)   Pulse 97   Temp 98.4 F (36.9 C) (Oral)   Resp 18   Ht 5\' 5"  (1.651 m)   Wt 49.1 kg   LMP 10/13/2011   SpO2 96%   BMI 18.01 kg/m  Gen:   Awake, tearful Resp:  Normal effort  MSK:   Moves extremities without difficulty  Other:  No obvious compartment syndrome noted on LUE, splint is in place  Medical Decision Making  Medically screening exam initiated at 12:40 PM.  Appropriate orders placed.  Stacy Bolton was informed that the remainder of the evaluation will be completed by another provider, this initial triage assessment does not replace that evaluation, and the importance of remaining in the ED until their evaluation is complete.     Fayrene Helper, PA-C 08/06/23 1242

## 2023-08-07 LAB — BLOOD CULTURE ID PANEL (REFLEXED) - BCID2
A.calcoaceticus-baumannii: NOT DETECTED
Bacteroides fragilis: NOT DETECTED
Candida albicans: NOT DETECTED
Candida auris: NOT DETECTED
Candida glabrata: NOT DETECTED
Candida krusei: NOT DETECTED
Candida parapsilosis: NOT DETECTED
Candida tropicalis: NOT DETECTED
Cryptococcus neoformans/gattii: NOT DETECTED
Enterobacter cloacae complex: NOT DETECTED
Enterobacterales: NOT DETECTED
Enterococcus Faecium: NOT DETECTED
Enterococcus faecalis: NOT DETECTED
Escherichia coli: NOT DETECTED
Haemophilus influenzae: NOT DETECTED
Klebsiella aerogenes: NOT DETECTED
Klebsiella oxytoca: NOT DETECTED
Klebsiella pneumoniae: NOT DETECTED
Listeria monocytogenes: NOT DETECTED
Methicillin resistance mecA/C: DETECTED — AB
Neisseria meningitidis: NOT DETECTED
Proteus species: NOT DETECTED
Pseudomonas aeruginosa: NOT DETECTED
Salmonella species: NOT DETECTED
Serratia marcescens: NOT DETECTED
Staphylococcus aureus (BCID): NOT DETECTED
Staphylococcus epidermidis: DETECTED — AB
Staphylococcus lugdunensis: DETECTED — AB
Staphylococcus species: DETECTED — AB
Stenotrophomonas maltophilia: NOT DETECTED
Streptococcus agalactiae: NOT DETECTED
Streptococcus pneumoniae: NOT DETECTED
Streptococcus pyogenes: NOT DETECTED
Streptococcus species: DETECTED — AB

## 2023-08-07 LAB — RAPID URINE DRUG SCREEN, HOSP PERFORMED
Amphetamines: NOT DETECTED
Barbiturates: NOT DETECTED
Benzodiazepines: POSITIVE — AB
Cocaine: POSITIVE — AB
Opiates: POSITIVE — AB
Tetrahydrocannabinol: POSITIVE — AB

## 2023-08-07 LAB — EXPECTORATED SPUTUM ASSESSMENT W GRAM STAIN, RFLX TO RESP C

## 2023-08-07 LAB — CBC
HCT: 40.1 % (ref 36.0–46.0)
Hemoglobin: 12.6 g/dL (ref 12.0–15.0)
MCH: 32.6 pg (ref 26.0–34.0)
MCHC: 31.4 g/dL (ref 30.0–36.0)
MCV: 103.9 fL — ABNORMAL HIGH (ref 80.0–100.0)
Platelets: 273 10*3/uL (ref 150–400)
RBC: 3.86 MIL/uL — ABNORMAL LOW (ref 3.87–5.11)
RDW: 13.8 % (ref 11.5–15.5)
WBC: 6.4 10*3/uL (ref 4.0–10.5)
nRBC: 0 % (ref 0.0–0.2)

## 2023-08-07 LAB — BASIC METABOLIC PANEL
Anion gap: 9 (ref 5–15)
BUN: 8 mg/dL (ref 6–20)
CO2: 27 mmol/L (ref 22–32)
Calcium: 8.9 mg/dL (ref 8.9–10.3)
Chloride: 103 mmol/L (ref 98–111)
Creatinine, Ser: 0.32 mg/dL — ABNORMAL LOW (ref 0.44–1.00)
GFR, Estimated: >60 mL/min (ref >60)
Glucose, Bld: 88 mg/dL (ref 70–99)
Potassium: 3.7 mmol/L (ref 3.5–5.1)
Sodium: 139 mmol/L (ref 135–145)

## 2023-08-07 LAB — RESP PANEL BY RT-PCR (RSV, FLU A&B, COVID)  RVPGX2
Influenza A by PCR: NEGATIVE
Influenza B by PCR: NEGATIVE
Resp Syncytial Virus by PCR: NEGATIVE
SARS Coronavirus 2 by RT PCR: NEGATIVE

## 2023-08-07 LAB — STREP PNEUMONIAE URINARY ANTIGEN: Strep Pneumo Urinary Antigen: NEGATIVE

## 2023-08-07 MED ORDER — LORATADINE 10 MG PO TABS
10.0000 mg | ORAL_TABLET | Freq: Every day | ORAL | Status: DC
  Administered 2023-08-07 – 2023-08-17 (×11): 10 mg via ORAL
  Filled 2023-08-07 (×11): qty 1

## 2023-08-07 MED ORDER — ALUM & MAG HYDROXIDE-SIMETH 200-200-20 MG/5ML PO SUSP
30.0000 mL | Freq: Once | ORAL | Status: AC
  Administered 2023-08-07: 30 mL via ORAL
  Filled 2023-08-07: qty 30

## 2023-08-07 MED ORDER — DIPHENHYDRAMINE HCL 25 MG PO CAPS
25.0000 mg | ORAL_CAPSULE | Freq: Once | ORAL | Status: AC
  Administered 2023-08-07: 25 mg via ORAL
  Filled 2023-08-07: qty 1

## 2023-08-07 MED ORDER — POTASSIUM CHLORIDE CRYS ER 20 MEQ PO TBCR
20.0000 meq | EXTENDED_RELEASE_TABLET | Freq: Once | ORAL | Status: AC
  Administered 2023-08-07: 20 meq via ORAL
  Filled 2023-08-07: qty 1

## 2023-08-07 MED ORDER — AZITHROMYCIN 250 MG PO TABS
500.0000 mg | ORAL_TABLET | ORAL | Status: DC
  Administered 2023-08-07 – 2023-08-08 (×2): 500 mg via ORAL
  Filled 2023-08-07 (×2): qty 2

## 2023-08-07 MED ORDER — VANCOMYCIN HCL IN DEXTROSE 1-5 GM/200ML-% IV SOLN
1000.0000 mg | INTRAVENOUS | Status: DC
  Administered 2023-08-08 – 2023-08-11 (×4): 1000 mg via INTRAVENOUS
  Filled 2023-08-07 (×4): qty 200

## 2023-08-07 MED ORDER — OXYCODONE HCL 5 MG PO TABS
5.0000 mg | ORAL_TABLET | Freq: Once | ORAL | Status: AC
  Administered 2023-08-07: 5 mg via ORAL
  Filled 2023-08-07: qty 1

## 2023-08-07 MED ORDER — VANCOMYCIN HCL IN DEXTROSE 1-5 GM/200ML-% IV SOLN
1000.0000 mg | Freq: Once | INTRAVENOUS | Status: AC
  Administered 2023-08-07: 1000 mg via INTRAVENOUS
  Filled 2023-08-07: qty 200

## 2023-08-07 MED ORDER — SALINE SPRAY 0.65 % NA SOLN
1.0000 | NASAL | Status: DC | PRN
  Administered 2023-08-07 – 2023-08-15 (×3): 1 via NASAL
  Filled 2023-08-07: qty 44

## 2023-08-07 NOTE — Progress Notes (Signed)
PHARMACY - PHYSICIAN COMMUNICATION CRITICAL VALUE ALERT - BLOOD CULTURE IDENTIFICATION (BCID)  [name] is an 52 y.o. female who presented to [facility] on 08/06/2023 with a chief complaint of cough, shortness of breath.  Assessment:   Patient presenting with acute onset of shortness of breath associated with cough productive of yellow sputum.  She reports subjective fevers, chills, diaphoresis prior to arrival.   She reports ongoing tobacco use, smokes about 1 pack/day.  She reports smoking cocaine last night.  She reports significant alcohol use, last drink was last night.  She denies injection drug use, methamphetamine, or opioid abuse.   She says she is currently staying at the [facility].  Name of physician (or Provider) Contacted: Pahwani  Current antibiotics: azithromycin 500 mg po q24h and ceftriaxone 2 g IV q24h  Changes to prescribed antibiotics recommended:  Recommendations accepted by provider to add vancomycin per Pharmacy consult to regimen.  Results for orders placed or performed during the hospital encounter of 08/06/23  Blood Culture ID Panel (Reflexed) (Collected: 08/06/2023  6:05 PM)  Result Value Ref Range   Enterococcus faecalis NOT DETECTED NOT DETECTED   Enterococcus Faecium NOT DETECTED NOT DETECTED   Listeria monocytogenes NOT DETECTED NOT DETECTED   Staphylococcus species DETECTED (A) NOT DETECTED   Staphylococcus aureus (BCID) NOT DETECTED NOT DETECTED   Staphylococcus epidermidis DETECTED (A) NOT DETECTED   Staphylococcus lugdunensis DETECTED (A) NOT DETECTED   Streptococcus species DETECTED (A) NOT DETECTED   Streptococcus agalactiae NOT DETECTED NOT DETECTED   Streptococcus pneumoniae NOT DETECTED NOT DETECTED   Streptococcus pyogenes NOT DETECTED NOT DETECTED   A.calcoaceticus-baumannii NOT DETECTED NOT DETECTED   Bacteroides fragilis NOT DETECTED NOT DETECTED   Enterobacterales NOT DETECTED NOT DETECTED    Enterobacter cloacae complex NOT DETECTED NOT DETECTED   Escherichia coli NOT DETECTED NOT DETECTED   Klebsiella aerogenes NOT DETECTED NOT DETECTED   Klebsiella oxytoca NOT DETECTED NOT DETECTED   Klebsiella pneumoniae NOT DETECTED NOT DETECTED   Proteus species NOT DETECTED NOT DETECTED   Salmonella species NOT DETECTED NOT DETECTED   Serratia marcescens NOT DETECTED NOT DETECTED   Haemophilus influenzae NOT DETECTED NOT DETECTED   Neisseria meningitidis NOT DETECTED NOT DETECTED   Pseudomonas aeruginosa NOT DETECTED NOT DETECTED   Stenotrophomonas maltophilia NOT DETECTED NOT DETECTED   Candida albicans NOT DETECTED NOT DETECTED   Candida auris NOT DETECTED NOT DETECTED   Candida glabrata NOT DETECTED NOT DETECTED   Candida krusei NOT DETECTED NOT DETECTED   Candida parapsilosis NOT DETECTED NOT DETECTED   Candida tropicalis NOT DETECTED NOT DETECTED   Cryptococcus neoformans/gattii NOT DETECTED NOT DETECTED   Methicillin resistance mecA/C DETECTED (A) NOT DETECTED    [name], PharmD, BCPS 08/07/2023  5:27 PM

## 2023-08-07 NOTE — Progress Notes (Signed)
PROGRESS NOTE    Stacy Bolton  RUE:[id number] DOB: 1971/02/24 DOA: 08/06/2023 PCP: Patient, No Pcp Per   Brief Narrative:  HPI: Stacy Bolton is a 52 y.o. female with medical history significant for COPD, bipolar disorder, anxiety, substance use disorder (EtOH, cocaine, THC, tobacco), homelessness who presented to the ED for evaluation of shortness of breath.   Patient presenting with acute onset of shortness of breath associated with cough productive of yellow sputum.  She reports subjective fevers, chills, diaphoresis prior to arrival.   She reports ongoing tobacco use, smokes about 1 pack/day.  She reports smoking cocaine last night.  She reports significant alcohol use, last drink was last night.  She denies injection drug use, methamphetamine, or opioid abuse.   She says she is currently staying at the [facility].   ED Course  Labs/Imaging on admission: I have personally reviewed following labs and imaging studies.   Initial vitals showed BP 129/86, pulse 99, RR 18, temp 98.4 F, SpO2 96% on room air.   Labs showed WBC 8.2, hemoglobin 14.6, platelets 298,000, sodium 138, potassium 3.6, bicarb 28, BUN 8, creatinine 0.86, serum glucose 100, LFTs within normal limits.  Blood cultures in process.   2 view chest x-ray showed new right middle lobe pneumonia and emphysematous changes.   Patient was given IV morphine 4 mg, IV ceftriaxone and azithromycin, 1 L LR.  The hospitalist service was consulted to admit for further evaluation and management.  Assessment & Plan:   Principal Problem:   Community acquired pneumonia of right middle lobe of lung Active Problems:   COPD (chronic obstructive pulmonary disease) (HCC)   Bipolar disorder (HCC)   Substance use disorder   Closed fracture of distal end of left radius  Community-acquired pneumonia of the right middle lobe and COPD with acute exacerbation: CXR consistent with right middle lobe  pneumonia.  Patient was hypoxic to mid 80s on room air prior to arrival with EMS but saturating well on room air upon arrival to ED and ever since.  Patient's symptoms slightly improved.  Continues to have cough and brings up yellowish sputum.  Still wheezing on exam.  Continue Rocephin and Zithromax, Solu-Medrol and bronchodilators and follow cultures.  Encouraged incentive isometry  Substance use disorder: Patient reports ongoing tobacco use, smokes about 1 pack/day.  Reports smoking cocaine night prior to admission.  Also reports ongoing significant alcohol use although does not quantify volume.  Does not appear to be withdrawing at this time.  Continue CIWA protocol with as needed Ativan. -Monitor on CIWA protocol with Ativan as needed -Nicotine patch provided -Continue thiamine, folate, MVM -Check UDS and ethanol levels Counseling provided for quitting.   Bipolar disorder/anxiety: Seen by psychiatry on last admission and recommended discontinuation of Depakote and paroxetine due to medication noncompliance and ongoing substance use. -Continue Atarax 25 mg 3 times daily as needed -Trazodone 50 mg nightly as needed   Distal left radial fracture: Occurring after fall on 12/2.  Seen by orthopedics Dr. Alena Bills and scheduled for ORIF 12/19.    DVT prophylaxis: enoxaparin (LOVENOX) injection 40 mg Start: 08/06/23 2200   Code Status: Full Code  Family Communication:  None present at bedside.  Plan of care discussed with patient in length and he/she verbalized understanding and agreed with it.  Status is: Inpatient Remains inpatient appropriate because: Still significantly symptomatic.   Estimated body mass index is 18.01 kg/m as calculated from the following:   Height as of this  encounter: 5\' 5"  (1.651 m).   Weight as of this encounter: 49.1 kg.    Nutritional Assessment: Body mass index is 18.01 kg/m.Marland Kitchen Seen by dietician.  I agree with the assessment and plan as outlined  below: Nutrition Status:        . Skin Assessment: I have examined the patient's skin and I agree with the wound assessment as performed by the wound care RN as outlined below:    Consultants:  None  Procedures:  None  Antimicrobials:  Anti-infectives (From admission, onward)    Start     Dose/Rate Route Frequency Ordered Stop   08/07/23 1800  cefTRIAXone (ROCEPHIN) 2 g in sodium chloride 0.9 % 100 mL IVPB        2 g 200 mL/hr over 30 Minutes Intravenous Every 24 hours 08/06/23 1936 08/12/23 1759   08/07/23 1800  azithromycin (ZITHROMAX) 500 mg in sodium chloride 0.9 % 250 mL IVPB        500 mg 250 mL/hr over 60 Minutes Intravenous Every 24 hours 08/06/23 1936 08/12/23 1759   08/06/23 1800  cefTRIAXone (ROCEPHIN) 1 g in sodium chloride 0.9 % 100 mL IVPB        1 g 200 mL/hr over 30 Minutes Intravenous  Once 08/06/23 1747 08/06/23 1928   08/06/23 1800  azithromycin (ZITHROMAX) 500 mg in sodium chloride 0.9 % 250 mL IVPB        500 mg 250 mL/hr over 60 Minutes Intravenous  Once 08/06/23 1747 08/06/23 1928         Subjective: Patient seen and examined.  Still coughing and complains of shortness of breath.  Objective: Vitals:   08/06/23 1930 08/06/23 2020 08/07/23 0027 08/07/23 0432  BP: 109/73 99/68 101/76 121/89  Pulse: (!) 103 90 87 90  Resp: 17 16 16 16   Temp:  98 F (36.7 C) (!) 97.5 F (36.4 C) 98.3 F (36.8 C)  TempSrc:  Oral Axillary Oral  SpO2: 94% 93% 99% 93%  Weight:      Height:       No intake or output data in the 24 hours ending 08/07/23 0943 Filed Weights   08/06/23 1226  Weight: 49.1 kg    Examination:  General exam: Appears calm and comfortable  Respiratory system: Breath sounds with panexpiratory wheezes bilaterally. Respiratory effort normal. Cardiovascular system: S1 & S2 heard, RRR. No JVD, murmurs, rubs, gallops or clicks. No pedal edema. Gastrointestinal system: Abdomen is nondistended, soft and nontender. No organomegaly or  masses felt. Normal bowel sounds heard. Central nervous system: Alert and oriented. No focal neurological deficits. Extremities: Symmetric 5 x 5 power. Skin: No rashes, lesions or ulcers Psychiatry: Judgement and insight appear normal. Mood & affect appropriate.    Data Reviewed: I have personally reviewed following labs and imaging studies  CBC: Recent Labs  Lab 07/31/23 1531 08/01/23 2116 08/06/23 1249 08/07/23 0343  WBC 10.0 9.3 8.2 6.4  NEUTROABS 7.7  --   --   --   HGB 13.9 14.1 14.6 12.6  HCT 43.7 44.1 43.5 40.1  MCV 99.8 101.8* 101.6* 103.9*  PLT 312 306 299 273   Basic Metabolic Panel: Recent Labs  Lab 07/31/23 1531 08/01/23 2116 08/06/23 1249 08/07/23 0343  NA 140 140 138 139  K 3.6 4.2 3.6 3.7  CL 105 105 101 103  CO2 28 27 28 27   GLUCOSE 93 141* 100* 88  BUN 15 12 8 8   CREATININE 0.71 0.75 0.46 0.32*  CALCIUM 9.0 8.8*  8.5* 8.9   GFR: Estimated Creatinine Clearance: 62.3 mL/min (A) (by C-G formula based on SCr of 0.32 mg/dL (L)). Liver Function Tests: Recent Labs  Lab 07/31/23 1531 08/01/23 2116 08/06/23 1249  AST 38 26 19  ALT 35 25 16  ALKPHOS 81 84 82  BILITOT 0.8 0.4 1.1  PROT 6.0* 5.9* 6.5  ALBUMIN 3.6 3.4* 3.8   Recent Labs  Lab 07/31/23 1531 08/01/23 2116  LIPASE 29 34   No results for input(s): "AMMONIA" in the last 168 hours. Coagulation Profile: No results for input(s): "INR", "PROTIME" in the last 168 hours. Cardiac Enzymes: No results for input(s): "CKTOTAL", "CKMB", "CKMBINDEX", "TROPONINI" in the last 168 hours. BNP (last 3 results) No results for input(s): "PROBNP" in the last 8760 hours. HbA1C: No results for input(s): "HGBA1C" in the last 72 hours. CBG: No results for input(s): "GLUCAP" in the last 168 hours. Lipid Profile: No results for input(s): "CHOL", "HDL", "LDLCALC", "TRIG", "CHOLHDL", "LDLDIRECT" in the last 72 hours. Thyroid Function Tests: No results for input(s): "TSH", "T4TOTAL", "FREET4", "T3FREE",  "THYROIDAB" in the last 72 hours. Anemia Panel: No results for input(s): "VITAMINB12", "FOLATE", "FERRITIN", "TIBC", "IRON", "RETICCTPCT" in the last 72 hours. Sepsis Labs: No results for input(s): "PROCALCITON", "LATICACIDVEN" in the last 168 hours.  Recent Results (from the past 240 hour(s))  Urine Culture     Status: Abnormal   Collection Time: 08/01/23  9:12 PM   Specimen: Urine, Clean Catch  Result Value Ref Range Status   Specimen Description URINE, CLEAN CATCH  Final   Special Requests   Final    NONE Performed at [facility], [address] [place], [place] [address]    Culture >=100,000 COLONIES/mL ESCHERICHIA COLI (A)  Final   Report Status 08/04/2023 FINAL  Final   Organism ID, Bacteria ESCHERICHIA COLI (A)  Final      Susceptibility   Escherichia coli - MIC*    AMPICILLIN 4 SENSITIVE Sensitive     CEFEPIME <=0.12 SENSITIVE Sensitive     CEFTRIAXONE <=0.25 SENSITIVE Sensitive     CIPROFLOXACIN <=0.25 SENSITIVE Sensitive     GENTAMICIN <=1 SENSITIVE Sensitive     IMIPENEM <=0.25 SENSITIVE Sensitive     TRIMETH/SULFA <=20 SENSITIVE Sensitive     AMPICILLIN/SULBACTAM <=2 SENSITIVE Sensitive     PIP/TAZO <=4 SENSITIVE Sensitive ug/mL    * >=100,000 COLONIES/mL ESCHERICHIA COLI  Resp panel by RT-PCR (RSV, Flu A&B, Covid) Anterior Nasal Swab     Status: None   Collection Time: 08/07/23 12:51 AM   Specimen: Anterior Nasal Swab  Result Value Ref Range Status   SARS Coronavirus 2 by RT PCR NEGATIVE NEGATIVE Final    Comment: (NOTE) SARS-CoV-2 target nucleic acids are NOT DETECTED.  The SARS-CoV-2 RNA is generally detectable in upper respiratory specimens during the acute phase of infection. The lowest concentration of SARS-CoV-2 viral copies this assay can detect is 138 copies/mL. A negative result does not preclude SARS-Cov-2 infection and should not be used as the sole basis for treatment or other patient management decisions. A negative result may occur with   improper specimen collection/handling, submission of specimen other than nasopharyngeal swab, presence of viral mutation(s) within the areas targeted by this assay, and inadequate number of viral copies(<138 copies/mL). A negative result must be combined with clinical observations, patient history, and epidemiological information. The expected result is Negative.  Fact Sheet for Patients:  BloggerCourse.com  Fact Sheet for Healthcare Providers:  SeriousBroker.it  This test is no  t yet approved or cleared by the [place] and  has been authorized for detection and/or diagnosis of SARS-CoV-2 by FDA under an Emergency Use Authorization (EUA). This EUA will remain  in effect (meaning this test can be used) for the duration of the COVID-19 declaration under Section 564(b)(1) of the Act, 21 U.S.C.section 360bbb-3(b)(1), unless the authorization is terminated  or revoked sooner.       Influenza A by PCR NEGATIVE NEGATIVE Final   Influenza B by PCR NEGATIVE NEGATIVE Final    Comment: (NOTE) The Xpert Xpress SARS-CoV-2/FLU/RSV plus assay is intended as an aid in the diagnosis of influenza from Nasopharyngeal swab specimens and should not be used as a sole basis for treatment. Nasal washings and aspirates are unacceptable for Xpert Xpress SARS-CoV-2/FLU/RSV testing.  Fact Sheet for Patients: BloggerCourse.com  Fact Sheet for Healthcare Providers: SeriousBroker.it  This test is not yet approved or cleared by the [place] FDA and has been authorized for detection and/or diagnosis of SARS-CoV-2 by FDA under an Emergency Use Authorization (EUA). This EUA will remain in effect (meaning this test can be used) for the duration of the COVID-19 declaration under Section 564(b)(1) of the Act, 21 U.S.C. section 360bbb-3(b)(1), unless the authorization is terminated or revoked.      Resp Syncytial Virus by PCR NEGATIVE NEGATIVE Final    Comment: (NOTE) Fact Sheet for Patients: BloggerCourse.com  Fact Sheet for Healthcare Providers: SeriousBroker.it  This test is not yet approved or cleared by the [place] FDA and has been authorized for detection and/or diagnosis of SARS-CoV-2 by FDA under an Emergency Use Authorization (EUA). This EUA will remain in effect (meaning this test can be used) for the duration of the COVID-19 declaration under Section 564(b)(1) of the Act, 21 U.S.C. section 360bbb-3(b)(1), unless the authorization is terminated or revoked.  Performed at [facility], [address], [place], [place] [address]   Expectorated Sputum Assessment w Gram Stain, Rflx to Resp Cult     Status: None   Collection Time: 08/07/23  4:39 AM   Specimen: Expectorated Sputum  Result Value Ref Range Status   Specimen Description EXPECTORATED SPUTUM  Final   Special Requests NONE  Final   Sputum evaluation   Final    Sputum specimen not acceptable for testing.  Please recollect.   NOTIFIED Janace Hoard RN AT [phone] ON 08/07/2023 BY MECIAL J. Performed at [facility], [address] [place], [place] [address]    Report Status 08/07/2023 FINAL  Final     Radiology Studies: DG Chest 2 View  Result Date: 08/06/2023 CLINICAL DATA:  Shortness of breath for a few hours. EXAM: CHEST - 2 VIEW COMPARISON:  Chest x-ray dated July 29, 2023. FINDINGS: The heart size and mediastinal contours are within normal limits. Normal pulmonary vascularity. The lungs remain hyperinflated with emphysematous changes. New hazy airspace disease in the right middle lobe. Bilateral nipple shadows noted. No pleural effusion or pneumothorax. IMPRESSION: 1. New right middle lobe pneumonia. Followup PA and lateral chest X-ray is recommended in 3-4 weeks following trial of antibiotic therapy to ensure  resolution. 2. COPD. Electronically Signed   By: Obie Dredge M.D.   On: 08/06/2023 16:03    Scheduled Meds:  arformoterol  15 mcg Nebulization BID   budesonide (PULMICORT) nebulizer solution  0.25 mg Nebulization BID   enoxaparin (LOVENOX) injection  40 mg Subcutaneous Q24H   folic acid  1 mg Oral Daily   guaiFENesin  600 mg Oral BID   methylPREDNISolone (SOLU-MEDROL) injection  40 mg Intravenous Q12H   multivitamin with minerals  1 tablet Oral Daily   nicotine  21 mg Transdermal Daily   thiamine  100 mg Oral Daily   Or   thiamine  100 mg Intravenous Daily   Continuous Infusions:  azithromycin     cefTRIAXone (ROCEPHIN)  IV       LOS: 1 day   Hughie Closs, MD [facility]  08/07/2023, 9:43 AM   *Please note that this is a verbal dictation therefore any spelling or grammatical errors are due to the "Dragon Medical One" system interpretation.  Please page via Amion and do not message via secure chat for urgent patient care matters. Secure chat can be used for non urgent patient care matters.  How to contact the [facility] Attending or Consulting provider 7A - 7P or covering provider during after hours 7P -7A, for this patient?  Check the care team in [facility] and look for a) attending/consulting TRH provider listed and b) the [facility] team listed. Page or secure chat 7A-7P. Log into [email or web address] and use [facility]'s universal password to access. If you do not have the password, please contact the hospital operator. Locate the [facility] provider you are looking for under [facility] and page to a number that you can be directly reached. If you still have difficulty reaching the provider, please page the [facility] (Director on Call) for the Hospitalists listed on amion for assistance.

## 2023-08-07 NOTE — Progress Notes (Signed)
Pharmacy Antibiotic Note  Stacy Bolton is a 52 y.o. female admitted on 08/06/2023 with pneumonia.  Today, blood cultures positive in two bottles.  In addition to current regimen of azithromycin and ceftriaxone per provider, Pharmacy has been consulted for vancomycin dosing for bacteremia.  Plan: Continue azithromycin 500 mg po q24h and ceftriaxone 2 g IV q24h per provider Initiate vancomycin 1000 mg IV x 1 then 1000 mg IV every 24 hours (Goal AUC 400-550, eAUC 504.0, SCr used: rounded up to 0.8) Monitor clinical progress, renal function, vancomycin levels as indicated F/U C&S, abx deescalation / LOT   Height: 5\' 5"  (165.1 cm) Weight: 49.1 kg (108 lb 3.2 oz) IBW/kg (Calculated) : 57  Temp (24hrs), Avg:98.3 F (36.8 C), Min:97.5 F (36.4 C), Max:99.3 F (37.4 C)  Recent Labs  Lab 08/01/23 2116 08/06/23 1249 08/07/23 0343  WBC 9.3 8.2 6.4  CREATININE 0.75 0.46 0.32*    Estimated Creatinine Clearance: 62.3 mL/min (A) (by C-G formula based on SCr of 0.32 mg/dL (L)).    Allergies  Allergen Reactions   Hydrocodone-Acetaminophen Itching   Hydromorphone Hcl Itching   Oxycodone-Acetaminophen Itching    Antimicrobials this admission: 12/10 azithromycin >>  12/10 ceftriaxone >>  12/11 vancomycin >>  Microbiology results: 12/10 BCx: 2 out of 4 positive with staph species, staph epi, staph lugdunensis, and streptococcus species; mecA/C detected 12/11 Sputum: needs to be recollected   Thank you for allowing pharmacy to be a part of this patient's care.  Selinda Eon, PharmD, BCPS Clinical Pharmacist [place] 08/07/2023 5:45 PM

## 2023-08-07 NOTE — TOC Initial Note (Signed)
Transition of Care [facility]) - Initial/Assessment Note    Patient Details  Name: Stacy Bolton MRN: [id number] Date of Birth: 01/29/1971  [facility] CM/SW Contact:    Amada Jupiter, LCSW Phone Number: 08/07/2023, 1:48 PM  Clinical Narrative:                  Met with pt today to discuss dc planning needs and SA concerns.  Pt pleasant and eager to engage.  Very talkative and rambling at times, requiring redirection to stay on topic.  Pt confirms that she is homeless but notes she was told she has a bed available to her at [facility] - asks that I contact them to let them know she is here.  She asks about CSW assisting with Medicaid and explained that our financial navigators screen for MA eligibility with all "self pay" admissions.  Re: substance abuse, pt states, "I'm an alcoholic" and says she has attended several programs to address this.  Notes she recently had reached out to [facility] to see about getting support.  We reviewed local SA resources and will continue to discuss while here, however, pt says her plan is to dc to [facility] if still possible.   Have contacted [facility] and told that they currently do not have any available beds and encouraged me to contact them when dc known and offered other options of [facility] representative [employer]).  At this time, CSW will continue to follow.  Expected Discharge Plan: Homeless Shelter Barriers to Discharge: Inadequate or no insurance, Financial Resources   Patient Goals and CMS Choice Patient states their goals for this hospitalization and ongoing recovery are:: pt hopeful she will be able to dc to [facility] shelter          Expected Discharge Plan and Services In-house Referral: Clinical Social Work     Living arrangements for the past 2 months: Homeless                                      Prior Living Arrangements/Services Living arrangements for the past 2 months:  Homeless Lives with:: Self Patient language and need for interpreter reviewed:: Yes Do you feel safe going back to the place where you live?: Yes      Need for Family Participation in Patient Care: No (Comment) Care giver support system in place?: No (comment)   Criminal Activity/Legal Involvement Pertinent to Current Situation/Hospitalization: No - Comment as needed  Activities of Daily Living   ADL Screening (condition at time of admission) Independently performs ADLs?: Yes (appropriate for developmental age) Is the patient deaf or have difficulty hearing?: No Does the patient have difficulty seeing, even when wearing glasses/contacts?: No Does the patient have difficulty concentrating, remembering, or making decisions?: No  Permission Sought/Granted Permission sought to share information with : Other (comment) (local shelter) Permission granted to share information with : Yes, Verbal Permission Granted              Emotional Assessment Appearance:: Appears older than stated age Attitude/Demeanor/Rapport: Engaged Affect (typically observed): Accepting Orientation: : Oriented to Self, Oriented to Place, Oriented to  Time, Oriented to Situation Alcohol / Substance Use: Alcohol Use Psych Involvement: No (comment)  Admission diagnosis:  Community acquired pneumonia of right middle lobe of lung [J18.9] Patient Active Problem List   Diagnosis Date Noted   Community  acquired pneumonia of right middle lobe of lung 08/06/2023   Substance use disorder 08/06/2023   Closed fracture of distal end of left radius 08/06/2023   Protein-calorie malnutrition, severe 04/29/2023   Acute respiratory failure with hypoxia and hypercapnia (HCC) 04/28/2023   Bipolar I disorder, most recent episode (or current) manic (HCC) 01/31/2023   Cocaine abuse (HCC) 01/31/2023   Acute psychosis (HCC) 01/30/2023   COPD (chronic obstructive pulmonary disease) (HCC) 01/16/2023   NAUSEA 03/27/2010   Bipolar  disorder (HCC) 03/21/2010   Anxiety state 03/21/2010   Asthma 03/21/2010   Pyelonephritis 03/21/2010   History of seizure 03/21/2010   Closed fracture of bone 03/21/2010   PCP:  Patient, No Pcp Per Pharmacy:   [facility] [address], [place] - [address] [place] [address] Phone: [phone] Fax: [phone]  [id number] - [address] ([place]), [place] - [address] [place] [address] Phone: [phone] Fax: [phone]  [facility] [address] [place] [address] Phone: [phone] Fax: [phone]  [facility] [address] [place] [address] Phone: [phone] Fax: [phone]  [facility] [address] [place]), [place] - [address] [place] [address] Phone: [phone] Fax: [phone]  [facility] [address] [place] [address] Phone: [phone] Fax: [phone]     Social Determinants of Health (SDOH) Social History: SDOH Screenings   Food Insecurity: Food Insecurity Present (08/07/2023)  Housing: Patient Declined (08/07/2023)  Transportation Needs: Unmet Transportation Needs (08/07/2023)  Utilities: At Risk (08/07/2023)  Alcohol Screen: Low Risk  (01/31/2023)  Financial Resource Strain: High Risk (01/16/2023)   [facility] from [facility]  Physical Activity: Sufficiently Active (01/16/2023)   Received from [facility], [facility]  Social Connections: Socially Isolated (01/16/2023)   Received from [facility], [facility]  Tobacco Use: High Risk (08/06/2023)  Health Literacy: Low Risk  (01/16/2023)   Received from [facility]   SDOH Interventions:     Readmission Risk Interventions    08/07/2023    1:43 PM 04/30/2023   11:24 AM  Readmission Risk  Prevention Plan  Transportation Screening Complete Complete  Medication Review (RN Care Manager) Complete   PCP or Specialist appointment within 3-5 days of discharge Complete   HRI or Home Care Consult  Complete  SW Recovery Care/Counseling Consult Complete Complete  Palliative Care Screening Not Applicable Not Applicable  Skilled Nursing Facility Not Applicable Not Applicable

## 2023-08-07 NOTE — Progress Notes (Signed)
RT checked on pt twice, eating and on the phone, will return this am to give nebulizers.

## 2023-08-07 NOTE — Plan of Care (Signed)
°  Problem: Health Behavior/Discharge Planning: °Goal: Ability to manage health-related needs will improve °Outcome: Progressing °  °Problem: Clinical Measurements: °Goal: Diagnostic test results will improve °Outcome: Progressing °  °Problem: Activity: °Goal: Risk for activity intolerance will decrease °Outcome: Progressing °  °Problem: Coping: °Goal: Level of anxiety will decrease °Outcome: Progressing °  °

## 2023-08-08 DIAGNOSIS — J181 Lobar pneumonia, unspecified organism: Secondary | ICD-10-CM

## 2023-08-08 DIAGNOSIS — J441 Chronic obstructive pulmonary disease with (acute) exacerbation: Secondary | ICD-10-CM

## 2023-08-08 DIAGNOSIS — S52502D Unspecified fracture of the lower end of left radius, subsequent encounter for closed fracture with routine healing: Secondary | ICD-10-CM

## 2023-08-08 DIAGNOSIS — F1990 Other psychoactive substance use, unspecified, uncomplicated: Secondary | ICD-10-CM

## 2023-08-08 DIAGNOSIS — F319 Bipolar disorder, unspecified: Secondary | ICD-10-CM

## 2023-08-08 LAB — LEGIONELLA PNEUMOPHILA SEROGP 1 UR AG: L. pneumophila Serogp 1 Ur Ag: NEGATIVE

## 2023-08-08 LAB — HEPATITIS PANEL, ACUTE
HCV Ab: NONREACTIVE
Hep A IgM: NONREACTIVE
Hep B C IgM: NONREACTIVE
Hepatitis B Surface Ag: NONREACTIVE

## 2023-08-08 LAB — HIV ANTIBODY (ROUTINE TESTING W REFLEX): HIV Screen 4th Generation wRfx: NONREACTIVE

## 2023-08-08 MED ORDER — PANTOPRAZOLE SODIUM 40 MG PO TBEC
40.0000 mg | DELAYED_RELEASE_TABLET | Freq: Every day | ORAL | Status: DC
  Administered 2023-08-08 – 2023-08-17 (×10): 40 mg via ORAL
  Filled 2023-08-08 (×10): qty 1

## 2023-08-08 MED ORDER — TRAMADOL HCL 50 MG PO TABS
50.0000 mg | ORAL_TABLET | Freq: Three times a day (TID) | ORAL | Status: DC | PRN
  Administered 2023-08-09 – 2023-08-17 (×4): 50 mg via ORAL
  Filled 2023-08-08 (×4): qty 1

## 2023-08-08 MED ORDER — OXYCODONE HCL 5 MG PO TABS
5.0000 mg | ORAL_TABLET | Freq: Three times a day (TID) | ORAL | Status: DC | PRN
  Administered 2023-08-09 – 2023-08-17 (×14): 5 mg via ORAL
  Filled 2023-08-08 (×15): qty 1

## 2023-08-08 MED ORDER — ACETAMINOPHEN 325 MG PO TABS
650.0000 mg | ORAL_TABLET | Freq: Four times a day (QID) | ORAL | Status: DC
  Administered 2023-08-08 – 2023-08-17 (×28): 650 mg via ORAL
  Filled 2023-08-08 (×30): qty 2

## 2023-08-08 MED ORDER — FLUTICASONE PROPIONATE 50 MCG/ACT NA SUSP
1.0000 | Freq: Every day | NASAL | Status: DC
  Administered 2023-08-08 – 2023-08-17 (×10): 1 via NASAL
  Filled 2023-08-08: qty 16

## 2023-08-08 MED ORDER — ALUM & MAG HYDROXIDE-SIMETH 200-200-20 MG/5ML PO SUSP
30.0000 mL | Freq: Three times a day (TID) | ORAL | Status: DC | PRN
  Administered 2023-08-08 – 2023-08-12 (×4): 30 mL via ORAL
  Filled 2023-08-08 (×4): qty 30

## 2023-08-08 NOTE — Progress Notes (Signed)
PROGRESS NOTE  Stacy Bolton DPO:[id number] DOB: 19-Oct-1970   PCP: Patient, No Pcp Per  Patient is from: [facility] shelter   DOA: 08/06/2023 LOS: 2  Chief complaints Chief Complaint  Patient presents with   Shortness of Breath     Brief Narrative / Interim history: 52 year old F with PMH of COPD, anxiety, bipolar disorder, polysubstance use including EtOH, cocaine, THC and tobacco, homelessness and recent left arm fracture presenting with shortness of breath, productive cough with yellowish phlegm, fever, chills and diaphoresis and admitted with community-acquired pneumonia and COPD exacerbation.  Checks x-ray showed RML infiltrate.  Patient smokes about a pack a day.  She was started on ceftriaxone, Zithromax, steroid and breathing treatments and admitted.  Blood culture with Staph epidermidis, lugdunensis and Streptococcus species. Vancomycin added.  Repeat blood culture ordered.  Subjective: Seen and examined earlier this morning.  No major events overnight of this morning.  Continues to report nasal and chest congestion and productive cough.  Also complains significant left arm pain  Objective: Vitals:   08/07/23 1823 08/07/23 2027 08/08/23 0450 08/08/23 0812  BP:  (!) 142/90 120/88   Pulse:  95 93   Resp:  19 18   Temp:  97.8 F (36.6 C) (P) 97.7 F (36.5 C)   TempSrc:  Oral (P) Oral   SpO2: 92% 93% 95% 93%  Weight:      Height:        Examination:  GENERAL: No apparent distress.  Nontoxic. HEENT: MMM.  Vision and hearing grossly intact.  NECK: Supple.  No apparent JVD.  RESP:  No IWOB.  Diminished aeration.  Rhonchorous. CVS:  RRR. Heart sounds normal.  ABD/GI/GU: BS+. Abd soft, NTND.  MSK/EXT:  Moves extremities.  Left forearm in splint. SKIN: no apparent skin lesion or wound NEURO: Awake, alert and oriented appropriately.  No apparent focal neuro deficit. PSYCH: Calm. Normal affect.   Procedures:  None  Microbiology  summarized: COVID-19, influenza and RSV PCR nonreactive Sputum culture pending Blood culture with Staph epidermidis, lugdunensis and Streptococcus species Repeat blood culture pending.  Assessment and plan: Community-acquired pneumonia of the right middle lobe: CXR with RML infiltrate.  Reportedly hypoxic to 80s on RA prior to arrival to ED. Still with productive cough, rhonchi and chest congestion.  Smokes about a pack a day.  COVID-19, influenza and RSV PCR nonreactive.  Blood cultures as above.  Strep pneumo antigen negative. -Continue IV ceftriaxone and azithromycin -Vancomycin added due to positive blood culture -Wean oxygen as able -IS, FV, mucolytics   COPD with acute exacerbation: Likely due to the above and smoking cigarettes.  Still with productive cough and rhonchi on exam. -Continue IV Solu-Medrol 40 mg twice daily -Scheduled Brovana/Pulmicort with DuoNebs as needed -Antibiotics as above -Encouraged smoking cessation.  Positive blood culture: Blood culture with Staph epidermidis, lugdunensis and Streptococcus species.  I doubt this is true bacteremia.  -Agree with adding vancomycin -Follow repeat blood cultures   Polysubstance substance use disorder: Smokes about a pack a day.  Also admits to using cocaine and drinking alcohol but not quantifying.  UDS positive for opiate, cocaine and benzodiazepine.  No withdrawal symptoms. -Encouraged smoking cessation and refraining from cocaine and alcohol use -Continue CIWA with as needed Ativan -Continue nicotine patch -Continue thiamine, folate, MVM -TOC consult   Bipolar disorder/anxiety: Seen by psychiatry on last admission and recommended discontinuation of Depakote and paroxetine due to medication noncompliance and ongoing substance use. -Continue Atarax 25 mg 3 times  daily as needed -Trazodone 50 mg nightly as needed   Distal left radial fracture: after fall on 12/2.  Seen by orthopedics Dr. Alena Bills and scheduled for ORIF  12/19. -Pain control   Body mass index is 18.01 kg/m.          DVT prophylaxis:  enoxaparin (LOVENOX) injection 40 mg Start: 08/06/23 2200  Code Status: Full code Family Communication: None at bedside Level of care: Med-Surg Status is: Inpatient Remains inpatient appropriate because: Community-acquired pneumonia, COPD exacerbation and possible bacteremia   Final disposition: Homeless shelter Consultants:  None  55 minutes with more than 50% spent in reviewing records, counseling patient/family and coordinating care.   Sch Meds:  Scheduled Meds:  acetaminophen  650 mg Oral Q6H WA   arformoterol  15 mcg Nebulization BID   azithromycin  500 mg Oral Q24H   budesonide (PULMICORT) nebulizer solution  0.25 mg Nebulization BID   enoxaparin (LOVENOX) injection  40 mg Subcutaneous Q24H   folic acid  1 mg Oral Daily   guaiFENesin  600 mg Oral BID   loratadine  10 mg Oral Daily   methylPREDNISolone (SOLU-MEDROL) injection  40 mg Intravenous Q12H   multivitamin with minerals  1 tablet Oral Daily   nicotine  21 mg Transdermal Daily   thiamine  100 mg Oral Daily   Or   thiamine  100 mg Intravenous Daily   Continuous Infusions:  cefTRIAXone (ROCEPHIN)  IV 2 g (08/07/23 1733)   vancomycin     PRN Meds:.hydrOXYzine, ipratropium-albuterol, LORazepam **OR** LORazepam, ondansetron **OR** ondansetron (ZOFRAN) IV, oxyCODONE, senna-docusate, sodium chloride, traMADol, traZODone  Antimicrobials: Anti-infectives (From admission, onward)    Start     Dose/Rate Route Frequency Ordered Stop   08/08/23 1800  vancomycin (VANCOCIN) IVPB 1000 mg/200 mL premix        1,000 mg 200 mL/hr over 60 Minutes Intravenous Every 24 hours 08/07/23 1741     08/07/23 1800  cefTRIAXone (ROCEPHIN) 2 g in sodium chloride 0.9 % 100 mL IVPB        2 g 200 mL/hr over 30 Minutes Intravenous Every 24 hours 08/06/23 1936 08/12/23 1759   08/07/23 1800  azithromycin (ZITHROMAX) 500 mg in sodium chloride 0.9 % 250  mL IVPB  Status:  Discontinued        500 mg 250 mL/hr over 60 Minutes Intravenous Every 24 hours 08/06/23 1936 08/07/23 1142   08/07/23 1800  azithromycin (ZITHROMAX) tablet 500 mg        500 mg Oral Every 24 hours 08/07/23 1142 08/11/23 1759   08/07/23 1800  vancomycin (VANCOCIN) IVPB 1000 mg/200 mL premix        1,000 mg 200 mL/hr over 60 Minutes Intravenous  Once 08/07/23 1731 08/07/23 1905   08/06/23 1800  cefTRIAXone (ROCEPHIN) 1 g in sodium chloride 0.9 % 100 mL IVPB        1 g 200 mL/hr over 30 Minutes Intravenous  Once 08/06/23 1747 08/06/23 1928   08/06/23 1800  azithromycin (ZITHROMAX) 500 mg in sodium chloride 0.9 % 250 mL IVPB        500 mg 250 mL/hr over 60 Minutes Intravenous  Once 08/06/23 1747 08/06/23 1928        I have personally reviewed the following labs and images: CBC: Recent Labs  Lab 08/01/23 2116 08/06/23 1249 08/07/23 0343  WBC 9.3 8.2 6.4  HGB 14.1 14.6 12.6  HCT 44.1 43.5 40.1  MCV 101.8* 101.6* 103.9*  PLT 306 299 273  BMP &GFR Recent Labs  Lab 08/01/23 2116 08/06/23 1249 08/07/23 0343  NA 140 138 139  K 4.2 3.6 3.7  CL 105 101 103  CO2 27 28 27   GLUCOSE 141* 100* 88  BUN 12 8 8   CREATININE 0.75 0.46 0.32*  CALCIUM 8.8* 8.5* 8.9   Estimated Creatinine Clearance: 62.3 mL/min (A) (by C-G formula based on SCr of 0.32 mg/dL (L)). Liver & Pancreas: Recent Labs  Lab 08/01/23 2116 08/06/23 1249  AST 26 19  ALT 25 16  ALKPHOS 84 82  BILITOT 0.4 1.1  PROT 5.9* 6.5  ALBUMIN 3.4* 3.8   Recent Labs  Lab 08/01/23 2116  LIPASE 34   No results for input(s): "AMMONIA" in the last 168 hours. Diabetic: No results for input(s): "HGBA1C" in the last 72 hours. No results for input(s): "GLUCAP" in the last 168 hours. Cardiac Enzymes: No results for input(s): "CKTOTAL", "CKMB", "CKMBINDEX", "TROPONINI" in the last 168 hours. No results for input(s): "PROBNP" in the last 8760 hours. Coagulation Profile: No results for input(s): "INR",  "PROTIME" in the last 168 hours. Thyroid Function Tests: No results for input(s): "TSH", "T4TOTAL", "FREET4", "T3FREE", "THYROIDAB" in the last 72 hours. Lipid Profile: No results for input(s): "CHOL", "HDL", "LDLCALC", "TRIG", "CHOLHDL", "LDLDIRECT" in the last 72 hours. Anemia Panel: No results for input(s): "VITAMINB12", "FOLATE", "FERRITIN", "TIBC", "IRON", "RETICCTPCT" in the last 72 hours. Urine analysis:    Component Value Date/Time   COLORURINE YELLOW 08/01/2023 2112   APPEARANCEUR HAZY (A) 08/01/2023 2112   LABSPEC 1.025 08/01/2023 2112   PHURINE 5.0 08/01/2023 2112   GLUCOSEU NEGATIVE 08/01/2023 2112   HGBUR NEGATIVE 08/01/2023 2112   BILIRUBINUR NEGATIVE 08/01/2023 2112   KETONESUR NEGATIVE 08/01/2023 2112   PROTEINUR NEGATIVE 08/01/2023 2112   UROBILINOGEN 1.0 03/19/2010 1431   NITRITE POSITIVE (A) 08/01/2023 2112   LEUKOCYTESUR NEGATIVE 08/01/2023 2112   Sepsis Labs: Invalid input(s): "PROCALCITONIN", "LACTICIDVEN"  Microbiology: Recent Results (from the past 240 hours)  Urine Culture     Status: Abnormal   Collection Time: 08/01/23  9:12 PM   Specimen: Urine, Clean Catch  Result Value Ref Range Status   Specimen Description URINE, CLEAN CATCH  Final   Special Requests   Final    NONE Performed at [facility], [address], [place], [place] [address]    Culture >=100,000 COLONIES/mL ESCHERICHIA COLI (A)  Final   Report Status 08/04/2023 FINAL  Final   Organism ID, Bacteria ESCHERICHIA COLI (A)  Final      Susceptibility   Escherichia coli - MIC*    AMPICILLIN 4 SENSITIVE Sensitive     CEFEPIME <=0.12 SENSITIVE Sensitive     CEFTRIAXONE <=0.25 SENSITIVE Sensitive     CIPROFLOXACIN <=0.25 SENSITIVE Sensitive     GENTAMICIN <=1 SENSITIVE Sensitive     IMIPENEM <=0.25 SENSITIVE Sensitive     TRIMETH/SULFA <=20 SENSITIVE Sensitive     AMPICILLIN/SULBACTAM <=2 SENSITIVE Sensitive     PIP/TAZO <=4 SENSITIVE Sensitive ug/mL    * >=100,000 COLONIES/mL  ESCHERICHIA COLI  Blood culture (routine x 2)     Status: None (Preliminary result)   Collection Time: 08/06/23  6:05 PM   Specimen: BLOOD RIGHT FOREARM  Result Value Ref Range Status   Specimen Description   Final    BLOOD RIGHT FOREARM Performed at [facility], [address], [place], [place] [address]    Special Requests   Final    BOTTLES DRAWN AEROBIC AND ANAEROBIC Blood Culture  results may not be optimal due to an inadequate volume of blood received in culture bottles Performed at [facility], [address], [place], [place] [address]    Culture  Setup Time   Final    GRAM POSITIVE COCCI ANAEROBIC BOTTLE ONLY CRITICAL RESULT CALLED TO, READ BACK BY AND VERIFIED WITH: PHARMD MELISSA JAMES ON 08/07/23 @ 1709 BY DRT    Culture   Final    GRAM POSITIVE COCCI CULTURE REINCUBATED FOR BETTER GROWTH Performed at [facility], [address] [place], [place] [address]    Report Status PENDING  Incomplete  Blood Culture ID Panel (Reflexed)     Status: Abnormal   Collection Time: 08/06/23  6:05 PM  Result Value Ref Range Status   Enterococcus faecalis NOT DETECTED NOT DETECTED Final   Enterococcus Faecium NOT DETECTED NOT DETECTED Final   Listeria monocytogenes NOT DETECTED NOT DETECTED Final   Staphylococcus species DETECTED (A) NOT DETECTED Final    Comment: CRITICAL RESULT CALLED TO, READ BACK BY AND VERIFIED WITH: PHARMD MELISSA JAMES ON 08/07/23 @ 1709 BY DRT    Staphylococcus aureus (BCID) NOT DETECTED NOT DETECTED Final   Staphylococcus epidermidis DETECTED (A) NOT DETECTED Final    Comment: Methicillin (oxacillin) resistant coagulase negative staphylococcus. Possible blood culture contaminant (unless isolated from more than one blood culture draw or clinical case suggests pathogenicity). No antibiotic treatment is indicated for blood  culture contaminants. CRITICAL RESULT CALLED TO, READ BACK BY AND VERIFIED WITH: PHARMD MELISSA JAMES ON  08/07/23 @ 1709 BY DRT    Staphylococcus lugdunensis DETECTED (A) NOT DETECTED Final    Comment: Methicillin (oxacillin) resistant coagulase negative staphylococcus. Possible blood culture contaminant (unless isolated from more than one blood culture draw or clinical case suggests pathogenicity). No antibiotic treatment is indicated for blood  culture contaminants. CRITICAL RESULT CALLED TO, READ BACK BY AND VERIFIED WITH: PHARMD MELISSA JAMES ON 08/07/23 @ 1709 BY DRT    Streptococcus species DETECTED (A) NOT DETECTED Final    Comment: Not Enterococcus species, Streptococcus agalactiae, Streptococcus pyogenes, or Streptococcus pneumoniae. CRITICAL RESULT CALLED TO, READ BACK BY AND VERIFIED WITH: PHARMD MELISSA JAMES ON 08/07/23 @ 1709 BY DRT    Streptococcus agalactiae NOT DETECTED NOT DETECTED Final   Streptococcus pneumoniae NOT DETECTED NOT DETECTED Final   Streptococcus pyogenes NOT DETECTED NOT DETECTED Final   A.calcoaceticus-baumannii NOT DETECTED NOT DETECTED Final   Bacteroides fragilis NOT DETECTED NOT DETECTED Final   Enterobacterales NOT DETECTED NOT DETECTED Final   Enterobacter cloacae complex NOT DETECTED NOT DETECTED Final   Escherichia coli NOT DETECTED NOT DETECTED Final   Klebsiella aerogenes NOT DETECTED NOT DETECTED Final   Klebsiella oxytoca NOT DETECTED NOT DETECTED Final   Klebsiella pneumoniae NOT DETECTED NOT DETECTED Final   Proteus species NOT DETECTED NOT DETECTED Final   Salmonella species NOT DETECTED NOT DETECTED Final   Serratia marcescens NOT DETECTED NOT DETECTED Final   Haemophilus influenzae NOT DETECTED NOT DETECTED Final   Neisseria meningitidis NOT DETECTED NOT DETECTED Final   Pseudomonas aeruginosa NOT DETECTED NOT DETECTED Final   Stenotrophomonas maltophilia NOT DETECTED NOT DETECTED Final   Candida albicans NOT DETECTED NOT DETECTED Final   Candida auris NOT DETECTED NOT DETECTED Final   Candida glabrata NOT DETECTED NOT DETECTED Final    Candida krusei NOT DETECTED NOT DETECTED Final   Candida parapsilosis NOT DETECTED NOT DETECTED Final   Candida tropicalis NOT DETECTED NOT DETECTED Final   Cryptococcus neoformans/gattii  NOT DETECTED NOT DETECTED Final   Methicillin resistance mecA/C DETECTED (A) NOT DETECTED Final    Comment: CRITICAL RESULT CALLED TO, READ BACK BY AND VERIFIED WITH: PHARMD MELISSA JAMES ON 08/07/23 @ 1709 BY DRT Performed at [facility], [address], [place], [place] [address]   Blood culture (routine x 2)     Status: None (Preliminary result)   Collection Time: 08/06/23  6:15 PM   Specimen: BLOOD  Result Value Ref Range Status   Specimen Description   Final    BLOOD BLOOD RIGHT WRIST Performed at [facility], [address] [place], [place] [address]    Special Requests   Final    BOTTLES DRAWN AEROBIC AND ANAEROBIC Blood Culture results may not be optimal due to an inadequate volume of blood received in culture bottles Performed at [facility], [address] [place], [place] [address]    Culture  Setup Time   Final    GRAM POSITIVE COCCI IN CLUSTERS ANAEROBIC BOTTLE ONLY CRITICAL VALUE NOTED.  VALUE IS CONSISTENT WITH PREVIOUSLY REPORTED AND CALLED VALUE.    Culture   Final    GRAM POSITIVE COCCI CULTURE REINCUBATED FOR BETTER GROWTH Performed at [facility], [address] [place], [place] [address]    Report Status PENDING  Incomplete  Resp panel by RT-PCR (RSV, Flu A&B, Covid) Anterior Nasal Swab     Status: None   Collection Time: 08/07/23 12:51 AM   Specimen: Anterior Nasal Swab  Result Value Ref Range Status   SARS Coronavirus 2 by RT PCR NEGATIVE NEGATIVE Final    Comment: (NOTE) SARS-CoV-2 target nucleic acids are NOT DETECTED.  The SARS-CoV-2 RNA is generally detectable in upper respiratory specimens during the acute phase of infection. The lowest concentration of SARS-CoV-2 viral copies this assay can detect  is 138 copies/mL. A negative result does not preclude SARS-Cov-2 infection and should not be used as the sole basis for treatment or other patient management decisions. A negative result may occur with  improper specimen collection/handling, submission of specimen other than nasopharyngeal swab, presence of viral mutation(s) within the areas targeted by this assay, and inadequate number of viral copies(<138 copies/mL). A negative result must be combined with clinical observations, patient history, and epidemiological information. The expected result is Negative.  Fact Sheet for Patients:  BloggerCourse.com  Fact Sheet for Healthcare Providers:  SeriousBroker.it  This test is no t yet approved or cleared by the [place] FDA and  has been authorized for detection and/or diagnosis of SARS-CoV-2 by FDA under an Emergency Use Authorization (EUA). This EUA will remain  in effect (meaning this test can be used) for the duration of the COVID-19 declaration under Section 564(b)(1) of the Act, 21 U.S.C.section 360bbb-3(b)(1), unless the authorization is terminated  or revoked sooner.       Influenza A by PCR NEGATIVE NEGATIVE Final   Influenza B by PCR NEGATIVE NEGATIVE Final    Comment: (NOTE) The Xpert Xpress SARS-CoV-2/FLU/RSV plus assay is intended as an aid in the diagnosis of influenza from Nasopharyngeal swab specimens and should not be used as a sole basis for treatment. Nasal washings and aspirates are unacceptable for Xpert Xpress SARS-CoV-2/FLU/RSV testing.  Fact Sheet for Patients: BloggerCourse.com  Fact Sheet for Healthcare Providers: SeriousBroker.it  This test is not yet approved or cleared by the [place] FDA and has been authorized for detection and/or diagnosis of SARS-CoV-2 by FDA under an Emergency Use  Authorization (EUA). This EUA will remain in effect  (meaning this test can be used) for the duration of the COVID-19 declaration under Section 564(b)(1) of the Act, 21 U.S.C. section 360bbb-3(b)(1), unless the authorization is terminated or revoked.     Resp Syncytial Virus by PCR NEGATIVE NEGATIVE Final    Comment: (NOTE) Fact Sheet for Patients: BloggerCourse.com  Fact Sheet for Healthcare Providers: SeriousBroker.it  This test is not yet approved or cleared by the [place] FDA and has been authorized for detection and/or diagnosis of SARS-CoV-2 by FDA under an Emergency Use Authorization (EUA). This EUA will remain in effect (meaning this test can be used) for the duration of the COVID-19 declaration under Section 564(b)(1) of the Act, 21 U.S.C. section 360bbb-3(b)(1), unless the authorization is terminated or revoked.  Performed at [facility], [address] [place], [place] [address]   Expectorated Sputum Assessment w Gram Stain, Rflx to Resp Cult     Status: None   Collection Time: 08/07/23  4:39 AM   Specimen: Expectorated Sputum  Result Value Ref Range Status   Specimen Description EXPECTORATED SPUTUM  Final   Special Requests NONE  Final   Sputum evaluation   Final    Sputum specimen not acceptable for testing.  Please recollect.   NOTIFIED Janace Hoard RN AT [phone] ON 08/07/2023 BY MECIAL J. Performed at [facility], [address] [place], [place] [address]    Report Status 08/07/2023 FINAL  Final    Radiology Studies: No results found.    Star Cheese T. Kaleab Frasier [facility]  If 7PM-7AM, please contact night-coverage [email or web address] 08/08/2023, 12:18 PM

## 2023-08-08 NOTE — Plan of Care (Signed)
  Problem: Coping: Goal: Level of anxiety will decrease Outcome: Progressing   Problem: Respiratory: Goal: Ability to maintain adequate ventilation will improve Outcome: Progressing   Problem: Clinical Measurements: Goal: Ability to maintain a body temperature in the normal range will improve Outcome: Progressing   Problem: Activity: Goal: Ability to tolerate increased activity will improve Outcome: Progressing   Problem: Safety: Goal: Ability to remain free from injury will improve Outcome: Progressing

## 2023-08-08 NOTE — Consult Note (Signed)
[facility]    Date of Admission:  08/06/2023   Total days of inpatient antibiotics 2        Reason for Consult: Bacteremia    Principal Problem:   Community acquired pneumonia of right middle lobe of lung Active Problems:   Bipolar disorder (HCC)   COPD (chronic obstructive pulmonary disease) (HCC)   Substance use disorder   Closed fracture of distal end of left radius   Assessment: 52 year old female admitted with: #Polymicrobial  bacteremia(ConS+ strep) 2/2 PNA #Polysubstance abuse #Homeless - Patient states that on Friday she smoked some cocaine.  She does have a cough at baseline but she had worsening sputum production and cough following this.  Suspect bacteremia secondary to pneumonia as well.  Continue broad-spectrum antibiotics. - Patient denies IVDA.  But does report polysubstance abuse with cocaine.  She is a dentulous.  She does have a broken arm that that is in a cast.  No other joint pain.  No back pain.  Recommendations:  -Continue vanc+ ceftriaxone and azithromycin -Repeat blood Cx -TTE -Follow blood Cx for sens -STI workup: HIV , RPR, gc urine and acute hep panel  Microbiology:   Antibiotics:   Cultures: Blood 12/10 blood  Cx+ staph epi, staph lugdunenesis and strep species, cx+ GPC Urine  Other   HPI: Stacy Bolton is a 52 y.o. female with history of COPD, bipolar disorder, anxiety, substance abuse with alcohol, cocaine, THC and tobacco experiencing homelessness presented to the ED with shortness of breath.  Patient states that she had shortness of breath and cough, worse than her baseline following smoking cocaine the night prior.  On arrival to the ED her vitals were stable.  Will proceed 0.2 K.  Chest x-ray showed right middle lobe pneumonia and emphysematous changes.  Started on ceftriaxone azithromycin.  ID engaged as [facility] ID returned positive for multiple organisms.   Review of Systems: Review of  Systems  All other systems reviewed and are negative.   Past Medical History:  Diagnosis Date   Anxiety    Asthma    COPD (chronic obstructive pulmonary disease) (HCC)    Dyspnea    Endometriosis    Seizure (HCC)     Social History   Tobacco Use   Smoking status: Every Day    Current packs/day: 1.50    Average packs/day: 1.5 packs/day for 30.0 years (45.0 ttl pk-yrs)    Types: Cigarettes  Vaping Use   Vaping status: Never Used  Substance Use Topics   Alcohol use: Yes    Comment: last drink 08/05/23   Drug use: Yes    Types: Cocaine, Marijuana    Comment: last used 08/05/23    History reviewed. No pertinent family history. Scheduled Meds:  acetaminophen  650 mg Oral Q6H WA   arformoterol  15 mcg Nebulization BID   azithromycin  500 mg Oral Q24H   budesonide (PULMICORT) nebulizer solution  0.25 mg Nebulization BID   enoxaparin (LOVENOX) injection  40 mg Subcutaneous Q24H   fluticasone  1 spray Each Nare Daily   folic acid  1 mg Oral Daily   guaiFENesin  600 mg Oral BID   loratadine  10 mg Oral Daily   methylPREDNISolone (SOLU-MEDROL) injection  40 mg Intravenous Q12H   multivitamin with minerals  1 tablet Oral Daily   nicotine  21 mg Transdermal Daily   thiamine  100 mg Oral Daily   Or  thiamine  100 mg Intravenous Daily   Continuous Infusions:  cefTRIAXone (ROCEPHIN)  IV 2 g (08/07/23 1733)   vancomycin     PRN Meds:.hydrOXYzine, ipratropium-albuterol, LORazepam **OR** LORazepam, ondansetron **OR** ondansetron (ZOFRAN) IV, oxyCODONE, senna-docusate, sodium chloride, traMADol, traZODone Allergies  Allergen Reactions   Hydrocodone-Acetaminophen Itching   Hydromorphone Hcl Itching   Oxycodone-Acetaminophen Itching    OBJECTIVE: Blood pressure 112/79, pulse (!) 102, temperature 97.8 F (36.6 C), resp. rate 14, height 5\' 5"  (1.651 m), weight 49.1 kg, last menstrual period 10/13/2011, SpO2 94%.  Physical Exam Constitutional:      Appearance: Normal  appearance.  HENT:     Head: Normocephalic and atraumatic.     Right Ear: Tympanic membrane normal.     Left Ear: Tympanic membrane normal.     Nose: Nose normal.     Mouth/Throat:     Mouth: Mucous membranes are moist.  Eyes:     Extraocular Movements: Extraocular movements intact.     Conjunctiva/sclera: Conjunctivae normal.     Pupils: Pupils are equal, round, and reactive to light.  Cardiovascular:     Rate and Rhythm: Normal rate and regular rhythm.     Heart sounds: No murmur heard.    No friction rub. No gallop.  Pulmonary:     Effort: Pulmonary effort is normal.     Breath sounds: Normal breath sounds.  Abdominal:     General: Abdomen is flat.     Palpations: Abdomen is soft.  Musculoskeletal:        General: Normal range of motion.  Skin:    General: Skin is warm and dry.  Neurological:     General: No focal deficit present.     Mental Status: She is alert and oriented to person, place, and time.  Psychiatric:        Mood and Affect: Mood normal.     Lab Results Lab Results  Component Value Date   WBC 6.4 08/07/2023   HGB 12.6 08/07/2023   HCT 40.1 08/07/2023   MCV 103.9 (H) 08/07/2023   PLT 273 08/07/2023    Lab Results  Component Value Date   CREATININE 0.32 (L) 08/07/2023   BUN 8 08/07/2023   NA 139 08/07/2023   K 3.7 08/07/2023   CL 103 08/07/2023   CO2 27 08/07/2023    Lab Results  Component Value Date   ALT 16 08/06/2023   AST 19 08/06/2023   ALKPHOS 82 08/06/2023   BILITOT 1.1 08/06/2023       Danelle Earthly, MD [facility] 08/08/2023, 2:06 PM  I have personally spent 82 minutes involved in face-to-face and non-face-to-face activities for this patient on the day of the visit. Professional time spent includes the following activities: Preparing to see the patient (review of tests), Obtaining and/or reviewing separately obtained history (admission/discharge record), Performing a  medically appropriate examination and/or evaluation , Ordering medications/tests/procedures, referring and communicating with other health care professionals, Documenting clinical information in the EMR, Independently interpreting results (not separately reported), Communicating results to the patient/family/caregiver, Counseling and educating the patient/family/caregiver and Care coordination (not separately reported).

## 2023-08-09 ENCOUNTER — Inpatient Hospital Stay (HOSPITAL_COMMUNITY): Payer: Medicaid Other

## 2023-08-09 DIAGNOSIS — I38 Endocarditis, valve unspecified: Secondary | ICD-10-CM

## 2023-08-09 DIAGNOSIS — R7881 Bacteremia: Secondary | ICD-10-CM | POA: Insufficient documentation

## 2023-08-09 LAB — CBC
HCT: 43.1 % (ref 36.0–46.0)
Hemoglobin: 13.9 g/dL (ref 12.0–15.0)
MCH: 33.2 pg (ref 26.0–34.0)
MCHC: 32.3 g/dL (ref 30.0–36.0)
MCV: 102.9 fL — ABNORMAL HIGH (ref 80.0–100.0)
Platelets: 333 10*3/uL (ref 150–400)
RBC: 4.19 MIL/uL (ref 3.87–5.11)
RDW: 13.7 % (ref 11.5–15.5)
WBC: 10.6 10*3/uL — ABNORMAL HIGH (ref 4.0–10.5)
nRBC: 0 % (ref 0.0–0.2)

## 2023-08-09 LAB — RENAL FUNCTION PANEL
Albumin: 3.4 g/dL — ABNORMAL LOW (ref 3.5–5.0)
Anion gap: 8 (ref 5–15)
BUN: 15 mg/dL (ref 6–20)
CO2: 29 mmol/L (ref 22–32)
Calcium: 8.4 mg/dL — ABNORMAL LOW (ref 8.9–10.3)
Chloride: 100 mmol/L (ref 98–111)
Creatinine, Ser: 0.42 mg/dL — ABNORMAL LOW (ref 0.44–1.00)
GFR, Estimated: >60 mL/min (ref >60)
Glucose, Bld: 108 mg/dL — ABNORMAL HIGH (ref 70–99)
Phosphorus: 3.4 mg/dL (ref 2.5–4.6)
Potassium: 3.8 mmol/L (ref 3.5–5.1)
Sodium: 137 mmol/L (ref 135–145)

## 2023-08-09 LAB — MAGNESIUM: Magnesium: 2.5 mg/dL — ABNORMAL HIGH (ref 1.7–2.4)

## 2023-08-09 LAB — CULTURE, BLOOD (ROUTINE X 2)

## 2023-08-09 LAB — RPR: RPR Ser Ql: NONREACTIVE

## 2023-08-09 LAB — ECHOCARDIOGRAM COMPLETE: Area-P 1/2: 3.19 cm2

## 2023-08-09 LAB — PROCALCITONIN: Procalcitonin: <0.1 ng/mL

## 2023-08-09 NOTE — Progress Notes (Signed)
PROGRESS NOTE  Stacy Bolton NWG:[id number] DOB: May 08, 1971   PCP: Patient, No Pcp Per  Patient is from: [facility] shelter   DOA: 08/06/2023 LOS: 3  Chief complaints Chief Complaint  Patient presents with   Shortness of Breath     Brief Narrative / Interim history: 52 year old F with PMH of COPD, anxiety, bipolar disorder, polysubstance use including EtOH, cocaine, THC and tobacco, homelessness and recent left arm fracture presenting with shortness of breath, productive cough with yellowish phlegm, fever, chills and diaphoresis and admitted with community-acquired pneumonia and COPD exacerbation.  Checks x-ray showed RML infiltrate.  Patient smokes about a pack a day.  She was started on ceftriaxone, Zithromax, steroid and breathing treatments and admitted.  Blood culture with Staph epidermidis, lugdunensis and Streptococcus species. Vancomycin added.  Repeat blood culture NGTD.  Echocardiogram pending.  ID following.  Subjective: Seen and examined earlier this morning.  No major events overnight of this morning. Noted some smoke from the right side of bed walking in. Odd smell.  Patient denies smoking or vaping.  No complaints.  Objective: Vitals:   08/08/23 2022 08/09/23 0121 08/09/23 0500 08/09/23 0903  BP:  125/89 110/83   Pulse:  86 81   Resp:  16 18   Temp: 97.6 F (36.4 C)     TempSrc: Oral     SpO2:  94% 94% 95%  Weight:      Height:        Examination:  GENERAL: No apparent distress.  Nontoxic. HEENT: MMM.  Vision and hearing grossly intact.  NECK: Supple.  No apparent JVD.  RESP:  No IWOB.  Diminished aeration.  Some rhonchi. CVS:  RRR. Heart sounds normal.  ABD/GI/GU: BS+. Abd soft, NTND.  MSK/EXT:  Moves extremities.  Left forearm in splint. SKIN: no apparent skin lesion or wound NEURO: Awake, alert and oriented appropriately.  No apparent focal neuro deficit. PSYCH: Calm. Normal affect.   Procedures:  None  Microbiology  summarized: COVID-19, influenza and RSV PCR nonreactive Sputum culture pending Blood culture with Staph epidermidis, lugdunensis and Streptococcus species Repeat blood culture pending.  Assessment and plan: Community-acquired pneumonia of the right middle lobe: CXR with RML infiltrate.  Reportedly hypoxic to 80s on RA prior to arrival to ED. Still with productive cough, rhonchi and chest congestion.  Smokes about a pack a day.  COVID-19, influenza and RSV PCR nonreactive.  Blood cultures as above.  Strep pneumo antigen negative. -Continue IV ceftriaxone and azithromycin -Vancomycin added due to positive blood culture -Wean oxygen as able -IS, FV, mucolytics   COPD with acute exacerbation: Likely due to the above and smoking cigarettes.  Still with productive cough and rhonchi on exam. -Continue IV Solu-Medrol 40 mg twice daily -Scheduled Brovana/Pulmicort with DuoNebs as needed -Antibiotics as above -Encouraged smoking cessation.  Polymicrobial bacteremia: Blood culture with Staph epidermidis, lugdunensis and Streptococcus species.  Denies IVDU.  Repeat blood culture NGTD.  Hepatitis panel negative.  Pro-Cal negative. -On vancomycin and ceftriaxone. -Follow echocardiogram -ID following   Polysubstance substance use disorder: Smokes about a pack a day.  Also admits to using cocaine and drinking alcohol but not quantifying.  Denies IVDU.  UDS positive for opiate, cocaine and benzodiazepine.  -Encouraged smoking cessation and refraining from cocaine and alcohol use -Continue CIWA with as needed Ativan -Continue nicotine patch -Continue thiamine, folate, MVM -TOC consult   Bipolar disorder/anxiety: Seen by psychiatry on last admission and recommended discontinuation of Depakote and paroxetine due to medication  noncompliance and ongoing substance use. -Continue Atarax 25 mg 3 times daily as needed -Trazodone 50 mg nightly as needed   Distal left radial fracture: after fall on 12/2.   Seen by orthopedics Dr. Alena Bills and scheduled for ORIF 12/19. -Pain control with as needed Tylenol, tramadol and oxycodone  Underweight Body mass index is 18.01 kg/m. Consult dietitian          DVT prophylaxis:  enoxaparin (LOVENOX) injection 40 mg Start: 08/06/23 2200  Code Status: Full code Family Communication: None at bedside Level of care: Med-Surg Status is: Inpatient Remains inpatient appropriate because: Community-acquired pneumonia, COPD exacerbation and possible bacteremia   Final disposition: Homeless shelter Consultants:  Infectious disease  55 minutes with more than 50% spent in reviewing records, counseling patient/family and coordinating care.   Sch Meds:  Scheduled Meds:  acetaminophen  650 mg Oral Q6H WA   arformoterol  15 mcg Nebulization BID   budesonide (PULMICORT) nebulizer solution  0.25 mg Nebulization BID   enoxaparin (LOVENOX) injection  40 mg Subcutaneous Q24H   fluticasone  1 spray Each Nare Daily   folic acid  1 mg Oral Daily   guaiFENesin  600 mg Oral BID   loratadine  10 mg Oral Daily   methylPREDNISolone (SOLU-MEDROL) injection  40 mg Intravenous Q12H   multivitamin with minerals  1 tablet Oral Daily   nicotine  21 mg Transdermal Daily   pantoprazole  40 mg Oral Daily   thiamine  100 mg Oral Daily   Or   thiamine  100 mg Intravenous Daily   Continuous Infusions:  cefTRIAXone (ROCEPHIN)  IV 2 g (08/08/23 1727)   vancomycin 1,000 mg (08/08/23 1901)   PRN Meds:.alum & mag hydroxide-simeth, hydrOXYzine, ipratropium-albuterol, LORazepam **OR** LORazepam, ondansetron **OR** ondansetron (ZOFRAN) IV, oxyCODONE, senna-docusate, sodium chloride, traMADol, traZODone  Antimicrobials: Anti-infectives (From admission, onward)    Start     Dose/Rate Route Frequency Ordered Stop   08/08/23 1800  vancomycin (VANCOCIN) IVPB 1000 mg/200 mL premix        1,000 mg 200 mL/hr over 60 Minutes Intravenous Every 24 hours 08/07/23 1741     08/07/23  1800  cefTRIAXone (ROCEPHIN) 2 g in sodium chloride 0.9 % 100 mL IVPB        2 g 200 mL/hr over 30 Minutes Intravenous Every 24 hours 08/06/23 1936 08/12/23 1759   08/07/23 1800  azithromycin (ZITHROMAX) 500 mg in sodium chloride 0.9 % 250 mL IVPB  Status:  Discontinued        500 mg 250 mL/hr over 60 Minutes Intravenous Every 24 hours 08/06/23 1936 08/07/23 1142   08/07/23 1800  azithromycin (ZITHROMAX) tablet 500 mg  Status:  Discontinued        500 mg Oral Every 24 hours 08/07/23 1142 08/09/23 0916   08/07/23 1800  vancomycin (VANCOCIN) IVPB 1000 mg/200 mL premix        1,000 mg 200 mL/hr over 60 Minutes Intravenous  Once 08/07/23 1731 08/07/23 1905   08/06/23 1800  cefTRIAXone (ROCEPHIN) 1 g in sodium chloride 0.9 % 100 mL IVPB        1 g 200 mL/hr over 30 Minutes Intravenous  Once 08/06/23 1747 08/06/23 1928   08/06/23 1800  azithromycin (ZITHROMAX) 500 mg in sodium chloride 0.9 % 250 mL IVPB        500 mg 250 mL/hr over 60 Minutes Intravenous  Once 08/06/23 1747 08/06/23 1928        I have personally reviewed the following  labs and images: CBC: Recent Labs  Lab 08/06/23 1249 08/07/23 0343 08/09/23 0327  WBC 8.2 6.4 10.6*  HGB 14.6 12.6 13.9  HCT 43.5 40.1 43.1  MCV 101.6* 103.9* 102.9*  PLT 299 273 333   BMP &GFR Recent Labs  Lab 08/06/23 1249 08/07/23 0343 08/09/23 0327  NA 138 139 137  K 3.6 3.7 3.8  CL 101 103 100  CO2 28 27 29   GLUCOSE 100* 88 108*  BUN 8 8 15   CREATININE 0.46 0.32* 0.42*  CALCIUM 8.5* 8.9 8.4*  MG  --   --  2.5*  PHOS  --   --  3.4   Estimated Creatinine Clearance: 62.3 mL/min (A) (by C-G formula based on SCr of 0.42 mg/dL (L)). Liver & Pancreas: Recent Labs  Lab 08/06/23 1249 08/09/23 0327  AST 19  --   ALT 16  --   ALKPHOS 82  --   BILITOT 1.1  --   PROT 6.5  --   ALBUMIN 3.8 3.4*   No results for input(s): "LIPASE", "AMYLASE" in the last 168 hours.  No results for input(s): "AMMONIA" in the last 168  hours. Diabetic: No results for input(s): "HGBA1C" in the last 72 hours. No results for input(s): "GLUCAP" in the last 168 hours. Cardiac Enzymes: No results for input(s): "CKTOTAL", "CKMB", "CKMBINDEX", "TROPONINI" in the last 168 hours. No results for input(s): "PROBNP" in the last 8760 hours. Coagulation Profile: No results for input(s): "INR", "PROTIME" in the last 168 hours. Thyroid Function Tests: No results for input(s): "TSH", "T4TOTAL", "FREET4", "T3FREE", "THYROIDAB" in the last 72 hours. Lipid Profile: No results for input(s): "CHOL", "HDL", "LDLCALC", "TRIG", "CHOLHDL", "LDLDIRECT" in the last 72 hours. Anemia Panel: No results for input(s): "VITAMINB12", "FOLATE", "FERRITIN", "TIBC", "IRON", "RETICCTPCT" in the last 72 hours. Urine analysis:    Component Value Date/Time   COLORURINE YELLOW 08/01/2023 2112   APPEARANCEUR HAZY (A) 08/01/2023 2112   LABSPEC 1.025 08/01/2023 2112   PHURINE 5.0 08/01/2023 2112   GLUCOSEU NEGATIVE 08/01/2023 2112   HGBUR NEGATIVE 08/01/2023 2112   BILIRUBINUR NEGATIVE 08/01/2023 2112   KETONESUR NEGATIVE 08/01/2023 2112   PROTEINUR NEGATIVE 08/01/2023 2112   UROBILINOGEN 1.0 03/19/2010 1431   NITRITE POSITIVE (A) 08/01/2023 2112   LEUKOCYTESUR NEGATIVE 08/01/2023 2112   Sepsis Labs: Invalid input(s): "PROCALCITONIN", "LACTICIDVEN"  Microbiology: Recent Results (from the past 240 hours)  Urine Culture     Status: Abnormal   Collection Time: 08/01/23  9:12 PM   Specimen: Urine, Clean Catch  Result Value Ref Range Status   Specimen Description URINE, CLEAN CATCH  Final   Special Requests   Final    NONE Performed at [facility], [address] [place], [place] [address]    Culture >=100,000 COLONIES/mL ESCHERICHIA COLI (A)  Final   Report Status 08/04/2023 FINAL  Final   Organism ID, Bacteria ESCHERICHIA COLI (A)  Final      Susceptibility   Escherichia coli - MIC*    AMPICILLIN 4 SENSITIVE Sensitive     CEFEPIME <=0.12  SENSITIVE Sensitive     CEFTRIAXONE <=0.25 SENSITIVE Sensitive     CIPROFLOXACIN <=0.25 SENSITIVE Sensitive     GENTAMICIN <=1 SENSITIVE Sensitive     IMIPENEM <=0.25 SENSITIVE Sensitive     TRIMETH/SULFA <=20 SENSITIVE Sensitive     AMPICILLIN/SULBACTAM <=2 SENSITIVE Sensitive     PIP/TAZO <=4 SENSITIVE Sensitive ug/mL    * >=100,000 COLONIES/mL ESCHERICHIA COLI  Blood culture (routine x 2)  Status: Abnormal (Preliminary result)   Collection Time: 08/06/23  6:05 PM   Specimen: BLOOD RIGHT FOREARM  Result Value Ref Range Status   Specimen Description   Final    BLOOD RIGHT FOREARM Performed at [facility], [address], [place], [place] [address]    Special Requests   Final    BOTTLES DRAWN AEROBIC AND ANAEROBIC Blood Culture results may not be optimal due to an inadequate volume of blood received in culture bottles Performed at [facility], [address] [place], [place] [address]    Culture  Setup Time   Final    GRAM POSITIVE COCCI ANAEROBIC BOTTLE ONLY CRITICAL RESULT CALLED TO, READ BACK BY AND VERIFIED WITH: PHARMD MELISSA JAMES ON 08/07/23 @ 1709 BY DRT    Culture (A)  Final    STAPHYLOCOCCUS EPIDERMIDIS SUSCEPTIBILITIES TO FOLLOW Performed at [facility], [address] [place], [place] [address]    Report Status PENDING  Incomplete  Blood Culture ID Panel (Reflexed)     Status: Abnormal   Collection Time: 08/06/23  6:05 PM  Result Value Ref Range Status   Enterococcus faecalis NOT DETECTED NOT DETECTED Final   Enterococcus Faecium NOT DETECTED NOT DETECTED Final   Listeria monocytogenes NOT DETECTED NOT DETECTED Final   Staphylococcus species DETECTED (A) NOT DETECTED Final    Comment: CRITICAL RESULT CALLED TO, READ BACK BY AND VERIFIED WITH: PHARMD MELISSA JAMES ON 08/07/23 @ 1709 BY DRT    Staphylococcus aureus (BCID) NOT DETECTED NOT DETECTED Final   Staphylococcus epidermidis DETECTED (A) NOT DETECTED Final     Comment: Methicillin (oxacillin) resistant coagulase negative staphylococcus. Possible blood culture contaminant (unless isolated from more than one blood culture draw or clinical case suggests pathogenicity). No antibiotic treatment is indicated for blood  culture contaminants. CRITICAL RESULT CALLED TO, READ BACK BY AND VERIFIED WITH: PHARMD MELISSA JAMES ON 08/07/23 @ 1709 BY DRT    Staphylococcus lugdunensis DETECTED (A) NOT DETECTED Final    Comment: Methicillin (oxacillin) resistant coagulase negative staphylococcus. Possible blood culture contaminant (unless isolated from more than one blood culture draw or clinical case suggests pathogenicity). No antibiotic treatment is indicated for blood  culture contaminants. CRITICAL RESULT CALLED TO, READ BACK BY AND VERIFIED WITH: PHARMD MELISSA JAMES ON 08/07/23 @ 1709 BY DRT    Streptococcus species DETECTED (A) NOT DETECTED Final    Comment: Not Enterococcus species, Streptococcus agalactiae, Streptococcus pyogenes, or Streptococcus pneumoniae. CRITICAL RESULT CALLED TO, READ BACK BY AND VERIFIED WITH: PHARMD MELISSA JAMES ON 08/07/23 @ 1709 BY DRT    Streptococcus agalactiae NOT DETECTED NOT DETECTED Final   Streptococcus pneumoniae NOT DETECTED NOT DETECTED Final   Streptococcus pyogenes NOT DETECTED NOT DETECTED Final   A.calcoaceticus-baumannii NOT DETECTED NOT DETECTED Final   Bacteroides fragilis NOT DETECTED NOT DETECTED Final   Enterobacterales NOT DETECTED NOT DETECTED Final   Enterobacter cloacae complex NOT DETECTED NOT DETECTED Final   Escherichia coli NOT DETECTED NOT DETECTED Final   Klebsiella aerogenes NOT DETECTED NOT DETECTED Final   Klebsiella oxytoca NOT DETECTED NOT DETECTED Final   Klebsiella pneumoniae NOT DETECTED NOT DETECTED Final   Proteus species NOT DETECTED NOT DETECTED Final   Salmonella species NOT DETECTED NOT DETECTED Final   Serratia marcescens NOT DETECTED NOT DETECTED Final   Haemophilus influenzae  NOT DETECTED NOT DETECTED Final   Neisseria meningitidis NOT DETECTED NOT DETECTED Final   Pseudomonas aeruginosa NOT DETECTED NOT DETECTED Final  Stenotrophomonas maltophilia NOT DETECTED NOT DETECTED Final   Candida albicans NOT DETECTED NOT DETECTED Final   Candida auris NOT DETECTED NOT DETECTED Final   Candida glabrata NOT DETECTED NOT DETECTED Final   Candida krusei NOT DETECTED NOT DETECTED Final   Candida parapsilosis NOT DETECTED NOT DETECTED Final   Candida tropicalis NOT DETECTED NOT DETECTED Final   Cryptococcus neoformans/gattii NOT DETECTED NOT DETECTED Final   Methicillin resistance mecA/C DETECTED (A) NOT DETECTED Final    Comment: CRITICAL RESULT CALLED TO, READ BACK BY AND VERIFIED WITH: PHARMD MELISSA JAMES ON 08/07/23 @ 1709 BY DRT Performed at [facility], [address], [place], [place] [address]   Blood culture (routine x 2)     Status: Abnormal   Collection Time: 08/06/23  6:15 PM   Specimen: BLOOD  Result Value Ref Range Status   Specimen Description   Final    BLOOD BLOOD RIGHT WRIST Performed at [facility], [address], [place], [place] [address]    Special Requests   Final    BOTTLES DRAWN AEROBIC AND ANAEROBIC Blood Culture results may not be optimal due to an inadequate volume of blood received in culture bottles Performed at [facility], [address], [place], [place] [address]    Culture  Setup Time   Final    GRAM POSITIVE COCCI IN CLUSTERS ANAEROBIC BOTTLE ONLY CRITICAL VALUE NOTED.  VALUE IS CONSISTENT WITH PREVIOUSLY REPORTED AND CALLED VALUE. Performed at [facility], [address], [place], [place] [address]    Culture STAPHYLOCOCCUS EPIDERMIDIS (A)  Final   Report Status 08/09/2023 FINAL  Final   Organism ID, Bacteria STAPHYLOCOCCUS EPIDERMIDIS  Final      Susceptibility   Staphylococcus epidermidis - MIC*    CIPROFLOXACIN <=0.5 SENSITIVE Sensitive     ERYTHROMYCIN <=0.25  SENSITIVE Sensitive     GENTAMICIN <=0.5 SENSITIVE Sensitive     OXACILLIN <=0.25 SENSITIVE Sensitive     TETRACYCLINE <=1 SENSITIVE Sensitive     VANCOMYCIN 1 SENSITIVE Sensitive     TRIMETH/SULFA <=10 SENSITIVE Sensitive     CLINDAMYCIN <=0.25 SENSITIVE Sensitive     RIFAMPIN <=0.5 SENSITIVE Sensitive     Inducible Clindamycin NEGATIVE Sensitive     * STAPHYLOCOCCUS EPIDERMIDIS  Resp panel by RT-PCR (RSV, Flu A&B, Covid) Anterior Nasal Swab     Status: None   Collection Time: 08/07/23 12:51 AM   Specimen: Anterior Nasal Swab  Result Value Ref Range Status   SARS Coronavirus 2 by RT PCR NEGATIVE NEGATIVE Final    Comment: (NOTE) SARS-CoV-2 target nucleic acids are NOT DETECTED.  The SARS-CoV-2 RNA is generally detectable in upper respiratory specimens during the acute phase of infection. The lowest concentration of SARS-CoV-2 viral copies this assay can detect is 138 copies/mL. A negative result does not preclude SARS-Cov-2 infection and should not be used as the sole basis for treatment or other patient management decisions. A negative result may occur with  improper specimen collection/handling, submission of specimen other than nasopharyngeal swab, presence of viral mutation(s) within the areas targeted by this assay, and inadequate number of viral copies(<138 copies/mL). A negative result must be combined with clinical observations, patient history, and epidemiological information. The expected result is Negative.  Fact Sheet for Patients:  BloggerCourse.com  Fact Sheet for Healthcare Providers:  SeriousBroker.it  This test is no t yet approved or cleared by the [place] FDA and  has been authorized for detection and/or  diagnosis of SARS-CoV-2 by FDA under an Emergency Use Authorization (EUA). This EUA will remain  in effect (meaning this test can be used) for the duration of the COVID-19 declaration under Section  564(b)(1) of the Act, 21 U.S.C.section 360bbb-3(b)(1), unless the authorization is terminated  or revoked sooner.       Influenza A by PCR NEGATIVE NEGATIVE Final   Influenza B by PCR NEGATIVE NEGATIVE Final    Comment: (NOTE) The Xpert Xpress SARS-CoV-2/FLU/RSV plus assay is intended as an aid in the diagnosis of influenza from Nasopharyngeal swab specimens and should not be used as a sole basis for treatment. Nasal washings and aspirates are unacceptable for Xpert Xpress SARS-CoV-2/FLU/RSV testing.  Fact Sheet for Patients: BloggerCourse.com  Fact Sheet for Healthcare Providers: SeriousBroker.it  This test is not yet approved or cleared by the [place] FDA and has been authorized for detection and/or diagnosis of SARS-CoV-2 by FDA under an Emergency Use Authorization (EUA). This EUA will remain in effect (meaning this test can be used) for the duration of the COVID-19 declaration under Section 564(b)(1) of the Act, 21 U.S.C. section 360bbb-3(b)(1), unless the authorization is terminated or revoked.     Resp Syncytial Virus by PCR NEGATIVE NEGATIVE Final    Comment: (NOTE) Fact Sheet for Patients: BloggerCourse.com  Fact Sheet for Healthcare Providers: SeriousBroker.it  This test is not yet approved or cleared by the [place] FDA and has been authorized for detection and/or diagnosis of SARS-CoV-2 by FDA under an Emergency Use Authorization (EUA). This EUA will remain in effect (meaning this test can be used) for the duration of the COVID-19 declaration under Section 564(b)(1) of the Act, 21 U.S.C. section 360bbb-3(b)(1), unless the authorization is terminated or revoked.  Performed at [facility], [address] [place], [place] [address]   Expectorated Sputum Assessment w Gram Stain, Rflx to Resp Cult     Status: None   Collection Time:  08/07/23  4:39 AM   Specimen: Expectorated Sputum  Result Value Ref Range Status   Specimen Description EXPECTORATED SPUTUM  Final   Special Requests NONE  Final   Sputum evaluation   Final    Sputum specimen not acceptable for testing.  Please recollect.   NOTIFIED Janace Hoard RN AT [phone] ON 08/07/2023 BY MECIAL J. Performed at [facility], [address] [place], [place] [address]    Report Status 08/07/2023 FINAL  Final  Culture, blood (Routine X 2) w Reflex to ID Panel     Status: None (Preliminary result)   Collection Time: 08/08/23  9:29 AM   Specimen: BLOOD  Result Value Ref Range Status   Specimen Description   Final    BLOOD SITE NOT SPECIFIED Performed at [facility], [address] [place], [place] [address]    Special Requests   Final    BOTTLES DRAWN AEROBIC AND ANAEROBIC Blood Culture results may not be optimal due to an inadequate volume of blood received in culture bottles Performed at [facility], [address], [place], [place] [address]    Culture   Final    NO GROWTH < 24 HOURS Performed at [facility], [address], [place], [place] [address]    Report Status PENDING  Incomplete  Culture, blood (Routine X 2) w Reflex to ID Panel     Status: None (Preliminary result)   Collection Time: 08/08/23  9:41 AM   Specimen: BLOOD  Result Value Ref  Range Status   Specimen Description   Final    BLOOD SITE NOT SPECIFIED Performed at [facility], [address] [place], [place] [address]    Special Requests   Final    BOTTLES DRAWN AEROBIC AND ANAEROBIC Blood Culture results may not be optimal due to an inadequate volume of blood received in culture bottles Performed at [facility], [address], [place], [place] [address]    Culture   Final    NO GROWTH < 24 HOURS Performed at [facility], [address] [place], [place] [address]    Report Status  PENDING  Incomplete    Radiology Studies: No results found.    Corene Resnick T[facility]  If 7PM-7AM, please contact night-coverage [email or web address] 08/09/2023, 1:20 PM

## 2023-08-09 NOTE — Progress Notes (Signed)
[facility]  Date of Admission:  08/06/2023   Total days of inpatient antibiotics 3  Principal Problem:   Community acquired pneumonia of right middle lobe of lung Active Problems:   Bipolar disorder (HCC)   COPD (chronic obstructive pulmonary disease) (HCC)   Substance use disorder   Closed fracture of distal end of left radius          Assessment: 52 year old female admitted with: #Polymicrobial  bacteremia(ConS+ strep) 2/2 PNA #Polysubstance abuse #Homeless - Patient states that on Friday she smoked some cocaine.  She does have a cough at baseline but she had worsening sputum production and cough following this.  Suspect bacteremia secondary to pneumonia as well.  Continue broad-spectrum antibiotics. - Patient denies IVDA.  But does report polysubstance abuse with cocaine.  She is a dentulous.  She does have a broken arm that that is in a cast.  No other joint pain.  No back pain. -DC azithromycin, completed 3 days - Continue ceftriaxone and vancomycin as cultures are pending. - Follow repeat blood cultures from 12/12 to ensure clearance - Follow admission cultures on 12/10/speciation.  I called and sensitivities for staph epi for both sets. - Follow-up TTE - Not a PICC line, diet given IVDA #STI workup - HIV, RPR, acute hep panel negative - Needs hepatitis B vaccine - GC urine pending Dr. Elinor Parkinson will be covering this weekend.  Microbiology:   Antibiotics: 12/10 azithromycin-present Ceftriaxone 12/20-present Vancomycin 12/11-present  Cultures: Blood 12/10 blood  Cx+ staph epi, staph lugdunenesis and strep species, Cx 2/2 staph epi Urine  Other   SUBJECTIVE: Sitting in bed.  No new complaints. Interval: Afebrile overnight.  WBC 10.6.  Review of Systems: Review of Systems  All other systems reviewed and are negative.    Scheduled Meds:  acetaminophen  650 mg Oral Q6H WA   arformoterol  15 mcg Nebulization BID    budesonide (PULMICORT) nebulizer solution  0.25 mg Nebulization BID   enoxaparin (LOVENOX) injection  40 mg Subcutaneous Q24H   fluticasone  1 spray Each Nare Daily   folic acid  1 mg Oral Daily   guaiFENesin  600 mg Oral BID   loratadine  10 mg Oral Daily   methylPREDNISolone (SOLU-MEDROL) injection  40 mg Intravenous Q12H   multivitamin with minerals  1 tablet Oral Daily   nicotine  21 mg Transdermal Daily   pantoprazole  40 mg Oral Daily   thiamine  100 mg Oral Daily   Or   thiamine  100 mg Intravenous Daily   Continuous Infusions:  cefTRIAXone (ROCEPHIN)  IV 2 g (08/08/23 1727)   vancomycin 1,000 mg (08/08/23 1901)   PRN Meds:.alum & mag hydroxide-simeth, hydrOXYzine, ipratropium-albuterol, LORazepam **OR** LORazepam, ondansetron **OR** ondansetron (ZOFRAN) IV, oxyCODONE, senna-docusate, sodium chloride, traMADol, traZODone Allergies  Allergen Reactions   Hydrocodone-Acetaminophen Itching   Hydromorphone Hcl Itching   Oxycodone-Acetaminophen Itching    OBJECTIVE: Vitals:   08/08/23 2022 08/09/23 0121 08/09/23 0500 08/09/23 0903  BP:  125/89 110/83   Pulse:  86 81   Resp:  16 18   Temp: 97.6 F (36.4 C)     TempSrc: Oral     SpO2:  94% 94% 95%  Weight:      Height:       Body mass index is 18.01 kg/m.  Physical Exam Constitutional:      Appearance: Normal appearance.  HENT:     Head: Normocephalic and atraumatic.  Right Ear: Tympanic membrane normal.     Left Ear: Tympanic membrane normal.     Nose: Nose normal.     Mouth/Throat:     Mouth: Mucous membranes are moist.  Eyes:     Extraocular Movements: Extraocular movements intact.     Conjunctiva/sclera: Conjunctivae normal.     Pupils: Pupils are equal, round, and reactive to light.  Cardiovascular:     Rate and Rhythm: Normal rate and regular rhythm.     Heart sounds: No murmur heard.    No friction rub. No gallop.  Pulmonary:     Effort: Pulmonary effort is normal.     Breath sounds: Normal  breath sounds.  Abdominal:     General: Abdomen is flat.     Palpations: Abdomen is soft.  Musculoskeletal:        General: Normal range of motion.  Skin:    General: Skin is warm and dry.  Neurological:     General: No focal deficit present.     Mental Status: She is alert and oriented to person, place, and time.  Psychiatric:        Mood and Affect: Mood normal.       Lab Results Lab Results  Component Value Date   WBC 10.6 (H) 08/09/2023   HGB 13.9 08/09/2023   HCT 43.1 08/09/2023   MCV 102.9 (H) 08/09/2023   PLT 333 08/09/2023    Lab Results  Component Value Date   CREATININE 0.42 (L) 08/09/2023   BUN 15 08/09/2023   NA 137 08/09/2023   K 3.8 08/09/2023   CL 100 08/09/2023   CO2 29 08/09/2023    Lab Results  Component Value Date   ALT 16 08/06/2023   AST 19 08/06/2023   ALKPHOS 82 08/06/2023   BILITOT 1.1 08/06/2023        Danelle Earthly, MD [facility] 08/09/2023, 10:56 AM  I have personally spent 52 minutes involved in face-to-face and non-face-to-face activities for this patient on the day of the visit. Professional time spent includes the following activities: Preparing to see the patient (review of tests), Obtaining and/or reviewing separately obtained history (admission/discharge record), Performing a medically appropriate examination and/or evaluation , Ordering medications/tests/procedures, referring and communicating with other health care professionals, Documenting clinical information in the EMR, Independently interpreting results (not separately reported), Communicating results to the patient/family/caregiver, Counseling and educating the patient/family/caregiver and Care coordination (not separately reported).

## 2023-08-09 NOTE — TOC Progression Note (Signed)
Transition of Care [facility]) - Progression Note    Patient Details  Name: Stacy Bolton MRN: [id number] Date of Birth: [birth year]  Transition of Care [facility]) CM/SW Contact  Amada Jupiter, LCSW Phone Number: 08/09/2023, 2:03 PM  Clinical Narrative:     Per MD, pt may be medically ready for dc over the weekend.  Have been able to speak with the director, Lawanna Kobus, at [facility] today who reports that pt has "access to [facility] but no bed currently available.  Pt is next on list for a physical bed.  But she is allowed to be inside [facility] and can stay there with hopes of bed opening up soon.  Director confirmed that they have all of pt's belongings at [facility] as well.  Have informed pt and she will plan to return to [facility] at [employer].  Will need assistance with transportation and will alert weekend [facility] staff.  Expected Discharge Plan: Homeless Shelter Barriers to Discharge: Inadequate or no insurance, Architect  Expected Discharge Plan and Services In-house Referral: Clinical Social Work     Living arrangements for the past 2 months: Homeless                                       Social Determinants of Health (SDOH) Interventions SDOH Screenings   Food Insecurity: Food Insecurity Present (08/07/2023)  Housing: Patient Declined (08/07/2023)  Transportation Needs: Unmet Transportation Needs (08/07/2023)  Utilities: At Risk (08/07/2023)  Alcohol Screen: Low Risk  (01/31/2023)  Financial Resource Strain: High Risk (01/16/2023)   Received from [facility], [facility]  Physical Activity: Sufficiently Active (01/16/2023)   Received from [facility], [facility]  Social Connections: Socially Isolated (01/16/2023)   Received from [facility], [facility]  Tobacco Use: High Risk (08/06/2023)  Health Literacy: Low Risk  (01/16/2023)   Received from [facility], [facility]    Readmission Risk Interventions    08/07/2023     1:43 PM 04/30/2023   11:24 AM  Readmission Risk Prevention Plan  Transportation Screening Complete Complete  Medication Review (RN Care Manager) Complete   PCP or Specialist appointment within 3-5 days of discharge Complete   HRI or Home Care Consult  Complete  SW Recovery Care/Counseling Consult Complete Complete  Palliative Care Screening Not Applicable Not Applicable  Skilled Nursing Facility Not Applicable Not Applicable

## 2023-08-09 NOTE — Progress Notes (Signed)
Echocardiogram 2D Echocardiogram has been performed.  Stacy Bolton RDCS 08/09/2023, 1:34 PM

## 2023-08-09 NOTE — Plan of Care (Signed)
  Problem: Education: Goal: Knowledge of General Education information will improve Description: Including pain rating scale, medication(s)/side effects and non-pharmacologic comfort measures Outcome: Progressing   Problem: Activity: Goal: Risk for activity intolerance will decrease Outcome: Progressing   

## 2023-08-10 DIAGNOSIS — R0602 Shortness of breath: Principal | ICD-10-CM

## 2023-08-10 DIAGNOSIS — R7881 Bacteremia: Secondary | ICD-10-CM

## 2023-08-10 MED ORDER — CARBAMIDE PEROXIDE 6.5 % OT SOLN
5.0000 [drp] | Freq: Two times a day (BID) | OTIC | Status: AC
  Administered 2023-08-10 – 2023-08-13 (×8): 5 [drp] via OTIC
  Filled 2023-08-10: qty 15

## 2023-08-10 MED ORDER — DIPHENHYDRAMINE HCL 25 MG PO CAPS
25.0000 mg | ORAL_CAPSULE | Freq: Three times a day (TID) | ORAL | Status: DC | PRN
  Administered 2023-08-10 – 2023-08-16 (×6): 25 mg via ORAL
  Filled 2023-08-10 (×8): qty 1

## 2023-08-10 MED ORDER — LORAZEPAM 0.5 MG PO TABS
0.5000 mg | ORAL_TABLET | Freq: Two times a day (BID) | ORAL | Status: DC | PRN
  Administered 2023-08-10: 0.5 mg via ORAL
  Filled 2023-08-10: qty 1

## 2023-08-10 MED ORDER — LORAZEPAM 0.5 MG PO TABS
0.5000 mg | ORAL_TABLET | Freq: Three times a day (TID) | ORAL | Status: DC | PRN
  Administered 2023-08-10 – 2023-08-14 (×5): 0.5 mg via ORAL
  Filled 2023-08-10 (×6): qty 1

## 2023-08-10 MED ORDER — OXYMETAZOLINE HCL 0.05 % NA SOLN
1.0000 | Freq: Two times a day (BID) | NASAL | Status: AC
  Administered 2023-08-10 – 2023-08-12 (×6): 1 via NASAL
  Filled 2023-08-10: qty 15

## 2023-08-10 NOTE — Progress Notes (Signed)
Pharmacy Antibiotic Note  Stacy Bolton is a 52 y.o. female with hx COPD and polysubstance abuse who presented to the ED on 08/06/2023 with c/o SOB.  She is currently on ceftriaxone and vancomycin for PNA and polymicrobial bacteremia.   Today, 08/10/2023: - day #5 abx - afeb - ID team seeing pt  Plan: - continue vancomycin 1000 mg IV q24h (est AUC 506). Will plan on checking level if plan is to treat with vancomycin for > 7 days. - ceftriaxone 2gm q24h  ________________________________________________  Height: 5\' 5"  (165.1 cm) Weight: 49.1 kg (108 lb 3.2 oz) IBW/kg (Calculated) : 57  Temp (24hrs), Avg:97.7 F (36.5 C), Min:97.5 F (36.4 C), Max:97.8 F (36.6 C)  Recent Labs  Lab 08/06/23 1249 08/07/23 0343 08/09/23 0327  WBC 8.2 6.4 10.6*  CREATININE 0.46 0.32* 0.42*    Estimated Creatinine Clearance: 62.3 mL/min (A) (by C-G formula based on SCr of 0.42 mg/dL (L)).    Allergies  Allergen Reactions   Hydrocodone-Acetaminophen Itching   Hydromorphone Hcl Itching   Oxycodone-Acetaminophen Itching   12/10 BCx: 2 out of 4 (anaerobic each set) positive with staph species, staph epi (pan sens) staph lugdunensis, and streptococcus species; mecA/C detected 12/11 strep pneumoL neg  12/12 BCx2: ngtd 12/12 HIV NR 12/12 Hep A B C NR 12/13 RPR NR  Thank you for allowing pharmacy to be a part of this patient's care.  Lucia Gaskins 08/10/2023 9:56 AM

## 2023-08-10 NOTE — Progress Notes (Signed)
Patient tearful and expressing concerns/worry that we were going to 'kick her out' of the hospital. She expressed concerns that the shelter where she stays had given away her spot and she is worried that she will have no other place to go.  Support provided, assured her that she would not be d/c'd until she was actually medically ready and there was somewhere for her to go. CSW following for resources.

## 2023-08-10 NOTE — Progress Notes (Signed)
ID brief note ( weekend coverage)  12/10 blood cx 1st set MSSE ( tetracycline S)  and 2nd set MSSE ( tetracycline R) and staph lugdunensis.   12/14 TTE no vegetations   Given different S of staph epidermidis, possible contaminant  Continue Vancomycin, pharmacy to dose for staph lugdunensis. Noted also on ceftriaxone for PNA  Following   Odette Fraction, MD Infectious Disease Physician [facility] [address] [place], [place] [address] Phone: [phone]  Fax: [phone]

## 2023-08-10 NOTE — Plan of Care (Signed)
  Problem: Clinical Measurements: Goal: Respiratory complications will improve Outcome: Progressing   Problem: Activity: Goal: Risk for activity intolerance will decrease Outcome: Progressing   Problem: Coping: Goal: Level of anxiety will decrease Outcome: Progressing   

## 2023-08-10 NOTE — Plan of Care (Signed)
  Problem: Education: Goal: Knowledge of General Education information will improve Description: Including pain rating scale, medication(s)/side effects and non-pharmacologic comfort measures Outcome: Progressing   Problem: Health Behavior/Discharge Planning: Goal: Ability to manage health-related needs will improve Outcome: Progressing   Problem: Clinical Measurements: Goal: Ability to maintain clinical measurements within normal limits will improve Outcome: Progressing Goal: Will remain free from infection Outcome: Progressing Goal: Diagnostic test results will improve Outcome: Progressing Goal: Respiratory complications will improve Outcome: Progressing Goal: Cardiovascular complication will be avoided Outcome: Progressing   Problem: Activity: Goal: Risk for activity intolerance will decrease Outcome: Progressing   Problem: Nutrition: Goal: Adequate nutrition will be maintained Outcome: Adequate for Discharge   Problem: Coping: Goal: Level of anxiety will decrease Outcome: Progressing   Problem: Elimination: Goal: Will not experience complications related to bowel motility Outcome: Progressing Goal: Will not experience complications related to urinary retention Outcome: Completed/Met   Problem: Pain Management: Goal: General experience of comfort will improve Outcome: Progressing   Problem: Safety: Goal: Ability to remain free from injury will improve Outcome: Progressing   Problem: Skin Integrity: Goal: Risk for impaired skin integrity will decrease Outcome: Progressing   Problem: Activity: Goal: Ability to tolerate increased activity will improve Outcome: Progressing   Problem: Clinical Measurements: Goal: Ability to maintain a body temperature in the normal range will improve Outcome: Progressing   Problem: Respiratory: Goal: Ability to maintain adequate ventilation will improve Outcome: Progressing Goal: Ability to maintain a clear airway will  improve Outcome: Progressing   Problem: Activity: Goal: Ability to tolerate increased activity will improve Outcome: Progressing   Problem: Clinical Measurements: Goal: Ability to maintain a body temperature in the normal range will improve Outcome: Progressing   Problem: Respiratory: Goal: Ability to maintain adequate ventilation will improve Outcome: Progressing Goal: Ability to maintain a clear airway will improve Outcome: Progressing

## 2023-08-10 NOTE — Progress Notes (Signed)
   08/10/23 1126  Assess: MEWS Score  BP 133/83  Pulse Rate (!) 115  Assess: MEWS Score  MEWS Temp 0  MEWS Systolic 0  MEWS Pulse 2  MEWS RR 0  MEWS LOC 0  MEWS Score 2  MEWS Score Color Yellow  Assess: if the MEWS score is Yellow or Red  Were vital signs accurate and taken at a resting state? Yes  Does the patient meet 2 or more of the SIRS criteria? No  MEWS guidelines implemented  Yes, yellow  Treat  MEWS Interventions Considered administering scheduled or prn medications/treatments as ordered  Take Vital Signs  Increase Vital Sign Frequency  Yellow: Q2hr x1, continue Q4hrs until patient remains green for 12hrs  Escalate  MEWS: Escalate Yellow: Discuss with charge nurse and consider notifying provider and/or RRT  Notify: Charge Nurse/RN  Name of Charge Nurse/RN Notified Allison Quarry, [place]  Provider Notification  Provider Name/Title Candelaria Stagers, MD  Date Provider Notified 08/10/23  Time Provider Notified 1130  Method of Notification Page (secure chat)  Provider response No new orders  Date of Provider Response 08/10/23  Time of Provider Response 1230  Assess: SIRS CRITERIA  SIRS Temperature  0  SIRS Respirations  0  SIRS Pulse 1  SIRS WBC 0  SIRS Score Sum  1

## 2023-08-10 NOTE — Progress Notes (Signed)
PROGRESS NOTE  Stacy Bolton VQQ:[id number] DOB: Mar 14, 1971   PCP: Patient, No Pcp Per  Patient is from: [facility] shelter   DOA: 08/06/2023 LOS: 4  Chief complaints Chief Complaint  Patient presents with   Shortness of Breath     Brief Narrative / Interim history: 52 year old F with PMH of COPD, anxiety, bipolar disorder, polysubstance use including EtOH, cocaine, THC and tobacco, homelessness and recent left arm fracture presenting with shortness of breath, productive cough with yellowish phlegm, fever, chills and diaphoresis and admitted with community-acquired pneumonia and COPD exacerbation.  Checks x-ray showed RML infiltrate.  Patient smokes about a pack a day.  She was started on ceftriaxone, Zithromax, steroid and breathing treatments and admitted.  Blood culture with Staph epidermidis, lugdunensis and Streptococcus species. Vancomycin added.  Repeat blood culture NGTD.  TTE without significant finding.  Remains on vancomycin and ceftriaxone.  ID following.  Subjective: Seen and examined earlier this morning.  No major events overnight of this morning.  Multiple complaints including nasal congestion, stuffed up ears, coughing, rectal prolapse, skin itching from oxycodone... Anxious about disposition if she is discharged.  Requesting Benadryl for itching.  Also requesting surgery for rectal prolapse  Objective: Vitals:   08/09/23 2050 08/10/23 0515 08/10/23 0911 08/10/23 1126  BP: (!) 150/87 123/81  133/83  Pulse: 95 78  (!) 115  Resp: 17 16    Temp: (!) 97.5 F (36.4 C) 97.8 F (36.6 C)    TempSrc: Oral     SpO2: 99% 96% 96%   Weight:      Height:        Examination:  GENERAL: No apparent distress.  Nontoxic. HEENT: MMM.  And hearing grossly intact.  Swelling and congestion of left nares. NECK: Supple.  No apparent JVD.  RESP:  No IWOB.  Wheezing or rhonchi bilaterally. CVS:  RRR. Heart sounds normal.  ABD/GI/GU: BS+. Abd soft, NTND.   MSK/EXT:  Moves extremities.  Left forearm in splint. SKIN: no apparent skin lesion or wound NEURO: Awake, alert and oriented appropriately.  No apparent focal neuro deficit. PSYCH: Appears anxious.  Procedures:  None  Microbiology summarized: COVID-19, influenza and RSV PCR nonreactive Sputum culture pending Blood culture with Staph epidermidis, lugdunensis and Streptococcus species Repeat blood culture pending.  Assessment and plan: Community-acquired pneumonia of the right middle lobe: CXR with RML infiltrate.  Reportedly hypoxic to 80s on RA prior to arrival to ED. Still with productive cough, rhonchi and chest congestion.  Smokes about a pack a day.  COVID-19, influenza and RSV PCR nonreactive.  Blood cultures as above.  Strep pneumo antigen negative. -Continue IV ceftriaxone.  Received Zithromax for 2 days. -Vancomycin added due to positive blood culture -Wean oxygen as able -IS, FV, mucolytics   COPD with acute exacerbation: Likely due to the above and smoking.  Still with cough, wheeze and rhonchi on exam. -Continue IV Solu-Medrol 40 mg twice daily -Scheduled Brovana/Pulmicort with DuoNebs as needed -Antibiotics as above -Encouraged smoking cessation.  Polymicrobial bacteremia: Blood culture with Staph epidermidis, lugdunensis and Streptococcus species.  Denies IVDU.  Repeat blood culture NGTD.  Hepatitis panel negative.  Pro-Cal negative.  TTE without vegetation. -On vancomycin and ceftriaxone. -ID following   Polysubstance substance use disorder: Smokes about a pack a day.  Also admits to using cocaine and drinking alcohol but not quantifying.  Denies IVDU.  UDS positive for opiate, cocaine and benzodiazepine.  Seems to have some withdrawal with nasal congestion, anxiety... -Encouraged smoking  cessation and refraining from cocaine and alcohol use -Continue CIWA monitoring.  Will trial low-dose of Ativan at 0.5 mg every 8 hours as needed. -Continue nicotine  patch -Continue thiamine, folate, MVM -TOC consult   Bipolar disorder/anxiety: Seen by psychiatry on last admission and recommended discontinuation of Depakote and paroxetine due to medication noncompliance and ongoing substance use.  Appears anxious partly due to steroid. -Ativan as above -Trazodone 50 mg nightly as needed   Distal left radial fracture: after fall on 12/2.  Seen by orthopedics Dr. Alena Bills and scheduled for ORIF 12/19. -Pain control with as needed Tylenol, tramadol and oxycodone  Nasal congestion: Likely due to withdrawal. -Afrin, Flonase and Claritin  Pruritus/skin itch -P.o. Benadryl as needed.  Avoid IV  Rectal prolapse: Chronic issue. -Avoid constipation -Outpatient follow-up  Underweight Body mass index is 18.01 kg/m. Consult dietitian          DVT prophylaxis:  enoxaparin (LOVENOX) injection 40 mg Start: 08/06/23 2200  Code Status: Full code Family Communication: None at bedside Level of care: Med-Surg Status is: Inpatient Remains inpatient appropriate because: Community-acquired pneumonia, COPD exacerbation and bacteremia   Final disposition: Homeless shelter Consultants:  Infectious disease  55 minutes with more than 50% spent in reviewing records, counseling patient/family and coordinating care.   Sch Meds:  Scheduled Meds:  acetaminophen  650 mg Oral Q6H WA   arformoterol  15 mcg Nebulization BID   budesonide (PULMICORT) nebulizer solution  0.25 mg Nebulization BID   carbamide peroxide  5 drop Both EARS BID   enoxaparin (LOVENOX) injection  40 mg Subcutaneous Q24H   fluticasone  1 spray Each Nare Daily   folic acid  1 mg Oral Daily   guaiFENesin  600 mg Oral BID   loratadine  10 mg Oral Daily   methylPREDNISolone (SOLU-MEDROL) injection  40 mg Intravenous Q12H   multivitamin with minerals  1 tablet Oral Daily   nicotine  21 mg Transdermal Daily   oxymetazoline  1 spray Each Nare BID   pantoprazole  40 mg Oral Daily    thiamine  100 mg Oral Daily   Or   thiamine  100 mg Intravenous Daily   Continuous Infusions:  cefTRIAXone (ROCEPHIN)  IV 2 g (08/09/23 1844)   vancomycin 1,000 mg (08/09/23 2025)   PRN Meds:.alum & mag hydroxide-simeth, diphenhydrAMINE, ipratropium-albuterol, LORazepam, ondansetron **OR** ondansetron (ZOFRAN) IV, oxyCODONE, senna-docusate, sodium chloride, traMADol, traZODone  Antimicrobials: Anti-infectives (From admission, onward)    Start     Dose/Rate Route Frequency Ordered Stop   08/08/23 1800  vancomycin (VANCOCIN) IVPB 1000 mg/200 mL premix        1,000 mg 200 mL/hr over 60 Minutes Intravenous Every 24 hours 08/07/23 1741     08/07/23 1800  cefTRIAXone (ROCEPHIN) 2 g in sodium chloride 0.9 % 100 mL IVPB        2 g 200 mL/hr over 30 Minutes Intravenous Every 24 hours 08/06/23 1936 08/12/23 1759   08/07/23 1800  azithromycin (ZITHROMAX) 500 mg in sodium chloride 0.9 % 250 mL IVPB  Status:  Discontinued        500 mg 250 mL/hr over 60 Minutes Intravenous Every 24 hours 08/06/23 1936 08/07/23 1142   08/07/23 1800  azithromycin (ZITHROMAX) tablet 500 mg  Status:  Discontinued        500 mg Oral Every 24 hours 08/07/23 1142 08/09/23 0916   08/07/23 1800  vancomycin (VANCOCIN) IVPB 1000 mg/200 mL premix        1,000 mg  200 mL/hr over 60 Minutes Intravenous  Once 08/07/23 1731 08/07/23 1905   08/06/23 1800  cefTRIAXone (ROCEPHIN) 1 g in sodium chloride 0.9 % 100 mL IVPB        1 g 200 mL/hr over 30 Minutes Intravenous  Once 08/06/23 1747 08/06/23 1928   08/06/23 1800  azithromycin (ZITHROMAX) 500 mg in sodium chloride 0.9 % 250 mL IVPB        500 mg 250 mL/hr over 60 Minutes Intravenous  Once 08/06/23 1747 08/06/23 1928        I have personally reviewed the following labs and images: CBC: Recent Labs  Lab 08/06/23 1249 08/07/23 0343 08/09/23 0327  WBC 8.2 6.4 10.6*  HGB 14.6 12.6 13.9  HCT 43.5 40.1 43.1  MCV 101.6* 103.9* 102.9*  PLT 299 273 333   BMP  &GFR Recent Labs  Lab 08/06/23 1249 08/07/23 0343 08/09/23 0327  NA 138 139 137  K 3.6 3.7 3.8  CL 101 103 100  CO2 28 27 29   GLUCOSE 100* 88 108*  BUN 8 8 15   CREATININE 0.46 0.32* 0.42*  CALCIUM 8.5* 8.9 8.4*  MG  --   --  2.5*  PHOS  --   --  3.4   Estimated Creatinine Clearance: 62.3 mL/min (A) (by C-G formula based on SCr of 0.42 mg/dL (L)). Liver & Pancreas: Recent Labs  Lab 08/06/23 1249 08/09/23 0327  AST 19  --   ALT 16  --   ALKPHOS 82  --   BILITOT 1.1  --   PROT 6.5  --   ALBUMIN 3.8 3.4*   No results for input(s): "LIPASE", "AMYLASE" in the last 168 hours.  No results for input(s): "AMMONIA" in the last 168 hours. Diabetic: No results for input(s): "HGBA1C" in the last 72 hours. No results for input(s): "GLUCAP" in the last 168 hours. Cardiac Enzymes: No results for input(s): "CKTOTAL", "CKMB", "CKMBINDEX", "TROPONINI" in the last 168 hours. No results for input(s): "PROBNP" in the last 8760 hours. Coagulation Profile: No results for input(s): "INR", "PROTIME" in the last 168 hours. Thyroid Function Tests: No results for input(s): "TSH", "T4TOTAL", "FREET4", "T3FREE", "THYROIDAB" in the last 72 hours. Lipid Profile: No results for input(s): "CHOL", "HDL", "LDLCALC", "TRIG", "CHOLHDL", "LDLDIRECT" in the last 72 hours. Anemia Panel: No results for input(s): "VITAMINB12", "FOLATE", "FERRITIN", "TIBC", "IRON", "RETICCTPCT" in the last 72 hours. Urine analysis:    Component Value Date/Time   COLORURINE YELLOW 08/01/2023 2112   APPEARANCEUR HAZY (A) 08/01/2023 2112   LABSPEC 1.025 08/01/2023 2112   PHURINE 5.0 08/01/2023 2112   GLUCOSEU NEGATIVE 08/01/2023 2112   HGBUR NEGATIVE 08/01/2023 2112   BILIRUBINUR NEGATIVE 08/01/2023 2112   KETONESUR NEGATIVE 08/01/2023 2112   PROTEINUR NEGATIVE 08/01/2023 2112   UROBILINOGEN 1.0 03/19/2010 1431   NITRITE POSITIVE (A) 08/01/2023 2112   LEUKOCYTESUR NEGATIVE 08/01/2023 2112   Sepsis Labs: Invalid  input(s): "PROCALCITONIN", "LACTICIDVEN"  Microbiology: Recent Results (from the past 240 hours)  Urine Culture     Status: Abnormal   Collection Time: 08/01/23  9:12 PM   Specimen: Urine, Clean Catch  Result Value Ref Range Status   Specimen Description URINE, CLEAN CATCH  Final   Special Requests   Final    NONE Performed at [facility], [address], [place], [place] [address]    Culture >=100,000 COLONIES/mL ESCHERICHIA COLI (A)  Final   Report Status 08/04/2023 FINAL  Final   Organism ID, Bacteria ESCHERICHIA COLI (A)  Final      Susceptibility   Escherichia coli - MIC*    AMPICILLIN 4 SENSITIVE Sensitive     CEFEPIME <=0.12 SENSITIVE Sensitive     CEFTRIAXONE <=0.25 SENSITIVE Sensitive     CIPROFLOXACIN <=0.25 SENSITIVE Sensitive     GENTAMICIN <=1 SENSITIVE Sensitive     IMIPENEM <=0.25 SENSITIVE Sensitive     TRIMETH/SULFA <=20 SENSITIVE Sensitive     AMPICILLIN/SULBACTAM <=2 SENSITIVE Sensitive     PIP/TAZO <=4 SENSITIVE Sensitive ug/mL    * >=100,000 COLONIES/mL ESCHERICHIA COLI  Blood culture (routine x 2)     Status: Abnormal (Preliminary result)   Collection Time: 08/06/23  6:05 PM   Specimen: BLOOD RIGHT FOREARM  Result Value Ref Range Status   Specimen Description   Final    BLOOD RIGHT FOREARM Performed at [facility], [address], [place], [place] [address]    Special Requests   Final    BOTTLES DRAWN AEROBIC AND ANAEROBIC Blood Culture results may not be optimal due to an inadequate volume of blood received in culture bottles Performed at [facility], [address], [place], [place] [address]    Culture  Setup Time   Final    GRAM POSITIVE COCCI ANAEROBIC BOTTLE ONLY CRITICAL RESULT CALLED TO, READ BACK BY AND VERIFIED WITH: PHARMD MELISSA JAMES ON 08/07/23 @ 1709 BY DRT    Culture (A)  Final    STAPHYLOCOCCUS EPIDERMIDIS Two isolates with different morphologies were identified as the same organism.The most  resistant organism was reported. STAPHYLOCOCCUS LUGDUNENSIS SUSCEPTIBILITIES TO FOLLOW Performed at [facility], [address] [place], [place] [address]    Report Status PENDING  Incomplete   Organism ID, Bacteria STAPHYLOCOCCUS EPIDERMIDIS  Final      Susceptibility   Staphylococcus epidermidis - MIC*    CIPROFLOXACIN <=0.5 SENSITIVE Sensitive     ERYTHROMYCIN <=0.25 SENSITIVE Sensitive     GENTAMICIN <=0.5 SENSITIVE Sensitive     OXACILLIN <=0.25 SENSITIVE Sensitive     TETRACYCLINE >=16 RESISTANT Resistant     VANCOMYCIN 2 SENSITIVE Sensitive     TRIMETH/SULFA <=10 SENSITIVE Sensitive     CLINDAMYCIN <=0.25 SENSITIVE Sensitive     RIFAMPIN <=0.5 SENSITIVE Sensitive     Inducible Clindamycin NEGATIVE Sensitive     * STAPHYLOCOCCUS EPIDERMIDIS  Blood Culture ID Panel (Reflexed)     Status: Abnormal   Collection Time: 08/06/23  6:05 PM  Result Value Ref Range Status   Enterococcus faecalis NOT DETECTED NOT DETECTED Final   Enterococcus Faecium NOT DETECTED NOT DETECTED Final   Listeria monocytogenes NOT DETECTED NOT DETECTED Final   Staphylococcus species DETECTED (A) NOT DETECTED Final    Comment: CRITICAL RESULT CALLED TO, READ BACK BY AND VERIFIED WITH: PHARMD MELISSA JAMES ON 08/07/23 @ 1709 BY DRT    Staphylococcus aureus (BCID) NOT DETECTED NOT DETECTED Final   Staphylococcus epidermidis DETECTED (A) NOT DETECTED Final    Comment: Methicillin (oxacillin) resistant coagulase negative staphylococcus. Possible blood culture contaminant (unless isolated from more than one blood culture draw or clinical case suggests pathogenicity). No antibiotic treatment is indicated for blood  culture contaminants. CRITICAL RESULT CALLED TO, READ BACK BY AND VERIFIED WITH: PHARMD MELISSA JAMES ON 08/07/23 @ 1709 BY DRT    Staphylococcus lugdunensis DETECTED (A) NOT DETECTED Final    Comment: Methicillin (oxacillin) resistant coagulase negative staphylococcus. Possible blood culture  contaminant (unless isolated from more than one blood culture draw or clinical case suggests  pathogenicity). No antibiotic treatment is indicated for blood  culture contaminants. CRITICAL RESULT CALLED TO, READ BACK BY AND VERIFIED WITH: PHARMD MELISSA JAMES ON 08/07/23 @ 1709 BY DRT    Streptococcus species DETECTED (A) NOT DETECTED Final    Comment: Not Enterococcus species, Streptococcus agalactiae, Streptococcus pyogenes, or Streptococcus pneumoniae. CRITICAL RESULT CALLED TO, READ BACK BY AND VERIFIED WITH: PHARMD MELISSA JAMES ON 08/07/23 @ 1709 BY DRT    Streptococcus agalactiae NOT DETECTED NOT DETECTED Final   Streptococcus pneumoniae NOT DETECTED NOT DETECTED Final   Streptococcus pyogenes NOT DETECTED NOT DETECTED Final   A.calcoaceticus-baumannii NOT DETECTED NOT DETECTED Final   Bacteroides fragilis NOT DETECTED NOT DETECTED Final   Enterobacterales NOT DETECTED NOT DETECTED Final   Enterobacter cloacae complex NOT DETECTED NOT DETECTED Final   Escherichia coli NOT DETECTED NOT DETECTED Final   Klebsiella aerogenes NOT DETECTED NOT DETECTED Final   Klebsiella oxytoca NOT DETECTED NOT DETECTED Final   Klebsiella pneumoniae NOT DETECTED NOT DETECTED Final   Proteus species NOT DETECTED NOT DETECTED Final   Salmonella species NOT DETECTED NOT DETECTED Final   Serratia marcescens NOT DETECTED NOT DETECTED Final   Haemophilus influenzae NOT DETECTED NOT DETECTED Final   Neisseria meningitidis NOT DETECTED NOT DETECTED Final   Pseudomonas aeruginosa NOT DETECTED NOT DETECTED Final   Stenotrophomonas maltophilia NOT DETECTED NOT DETECTED Final   Candida albicans NOT DETECTED NOT DETECTED Final   Candida auris NOT DETECTED NOT DETECTED Final   Candida glabrata NOT DETECTED NOT DETECTED Final   Candida krusei NOT DETECTED NOT DETECTED Final   Candida parapsilosis NOT DETECTED NOT DETECTED Final   Candida tropicalis NOT DETECTED NOT DETECTED Final   Cryptococcus neoformans/gattii  NOT DETECTED NOT DETECTED Final   Methicillin resistance mecA/C DETECTED (A) NOT DETECTED Final    Comment: CRITICAL RESULT CALLED TO, READ BACK BY AND VERIFIED WITH: PHARMD MELISSA JAMES ON 08/07/23 @ 1709 BY DRT Performed at [facility], [address] [place], [place] [address]   Blood culture (routine x 2)     Status: Abnormal   Collection Time: 08/06/23  6:15 PM   Specimen: BLOOD  Result Value Ref Range Status   Specimen Description   Final    BLOOD BLOOD RIGHT WRIST Performed at [facility], [address] [place], [place] [address]    Special Requests   Final    BOTTLES DRAWN AEROBIC AND ANAEROBIC Blood Culture results may not be optimal due to an inadequate volume of blood received in culture bottles Performed at [facility], [address] [place], [place] [address]    Culture  Setup Time   Final    GRAM POSITIVE COCCI IN CLUSTERS ANAEROBIC BOTTLE ONLY CRITICAL VALUE NOTED.  VALUE IS CONSISTENT WITH PREVIOUSLY REPORTED AND CALLED VALUE. Performed at [facility], [address] [place], [place] [address]    Culture STAPHYLOCOCCUS EPIDERMIDIS (A)  Final   Report Status 08/09/2023 FINAL  Final   Organism ID, Bacteria STAPHYLOCOCCUS EPIDERMIDIS  Final      Susceptibility   Staphylococcus epidermidis - MIC*    CIPROFLOXACIN <=0.5 SENSITIVE Sensitive     ERYTHROMYCIN <=0.25 SENSITIVE Sensitive     GENTAMICIN <=0.5 SENSITIVE Sensitive     OXACILLIN <=0.25 SENSITIVE Sensitive     TETRACYCLINE <=1 SENSITIVE Sensitive     VANCOMYCIN 1 SENSITIVE Sensitive     TRIMETH/SULFA <=10 SENSITIVE Sensitive     CLINDAMYCIN <=0.25 SENSITIVE Sensitive  RIFAMPIN <=0.5 SENSITIVE Sensitive     Inducible Clindamycin NEGATIVE Sensitive     * STAPHYLOCOCCUS EPIDERMIDIS  Resp panel by RT-PCR (RSV, Flu A&B, Covid) Anterior Nasal Swab     Status: None   Collection Time: 08/07/23 12:51 AM   Specimen: Anterior Nasal Swab  Result Value Ref  Range Status   SARS Coronavirus 2 by RT PCR NEGATIVE NEGATIVE Final    Comment: (NOTE) SARS-CoV-2 target nucleic acids are NOT DETECTED.  The SARS-CoV-2 RNA is generally detectable in upper respiratory specimens during the acute phase of infection. The lowest concentration of SARS-CoV-2 viral copies this assay can detect is 138 copies/mL. A negative result does not preclude SARS-Cov-2 infection and should not be used as the sole basis for treatment or other patient management decisions. A negative result may occur with  improper specimen collection/handling, submission of specimen other than nasopharyngeal swab, presence of viral mutation(s) within the areas targeted by this assay, and inadequate number of viral copies(<138 copies/mL). A negative result must be combined with clinical observations, patient history, and epidemiological information. The expected result is Negative.  Fact Sheet for Patients:  BloggerCourse.com  Fact Sheet for Healthcare Providers:  SeriousBroker.it  This test is no t yet approved or cleared by the [place] FDA and  has been authorized for detection and/or diagnosis of SARS-CoV-2 by FDA under an Emergency Use Authorization (EUA). This EUA will remain  in effect (meaning this test can be used) for the duration of the COVID-19 declaration under Section 564(b)(1) of the Act, 21 U.S.C.section 360bbb-3(b)(1), unless the authorization is terminated  or revoked sooner.       Influenza A by PCR NEGATIVE NEGATIVE Final   Influenza B by PCR NEGATIVE NEGATIVE Final    Comment: (NOTE) The Xpert Xpress SARS-CoV-2/FLU/RSV plus assay is intended as an aid in the diagnosis of influenza from Nasopharyngeal swab specimens and should not be used as a sole basis for treatment. Nasal washings and aspirates are unacceptable for Xpert Xpress SARS-CoV-2/FLU/RSV testing.  Fact Sheet for  Patients: BloggerCourse.com  Fact Sheet for Healthcare Providers: SeriousBroker.it  This test is not yet approved or cleared by the [place] FDA and has been authorized for detection and/or diagnosis of SARS-CoV-2 by FDA under an Emergency Use Authorization (EUA). This EUA will remain in effect (meaning this test can be used) for the duration of the COVID-19 declaration under Section 564(b)(1) of the Act, 21 U.S.C. section 360bbb-3(b)(1), unless the authorization is terminated or revoked.     Resp Syncytial Virus by PCR NEGATIVE NEGATIVE Final    Comment: (NOTE) Fact Sheet for Patients: BloggerCourse.com  Fact Sheet for Healthcare Providers: SeriousBroker.it  This test is not yet approved or cleared by the [place] FDA and has been authorized for detection and/or diagnosis of SARS-CoV-2 by FDA under an Emergency Use Authorization (EUA). This EUA will remain in effect (meaning this test can be used) for the duration of the COVID-19 declaration under Section 564(b)(1) of the Act, 21 U.S.C. section 360bbb-3(b)(1), unless the authorization is terminated or revoked.  Performed at [facility], [address], [place], [place] [address]   Expectorated Sputum Assessment w Gram Stain, Rflx to Resp Cult     Status: None   Collection Time: 08/07/23  4:39 AM   Specimen: Expectorated Sputum  Result Value Ref Range Status   Specimen Description EXPECTORATED SPUTUM  Final   Special Requests NONE  Final   Sputum evaluation   Final  Sputum specimen not acceptable for testing.  Please recollect.   NOTIFIED Janace Hoard RN AT [phone] ON 08/07/2023 BY MECIAL J. Performed at [facility], [address], [place], [place] [address]    Report Status 08/07/2023 FINAL  Final  Culture, blood (Routine X 2) w Reflex to ID Panel     Status: None (Preliminary  result)   Collection Time: 08/08/23  9:29 AM   Specimen: BLOOD  Result Value Ref Range Status   Specimen Description   Final    BLOOD SITE NOT SPECIFIED Performed at [facility], [address], [place], [place] [address]    Special Requests   Final    BOTTLES DRAWN AEROBIC AND ANAEROBIC Blood Culture results may not be optimal due to an inadequate volume of blood received in culture bottles Performed at [facility], [address] [place], [place] [address]    Culture   Final    NO GROWTH < 24 HOURS Performed at [facility], [address], [place], [place] [address]    Report Status PENDING  Incomplete  Culture, blood (Routine X 2) w Reflex to ID Panel     Status: None (Preliminary result)   Collection Time: 08/08/23  9:41 AM   Specimen: BLOOD  Result Value Ref Range Status   Specimen Description   Final    BLOOD SITE NOT SPECIFIED Performed at [facility], [address], [place], [place] [address]    Special Requests   Final    BOTTLES DRAWN AEROBIC AND ANAEROBIC Blood Culture results may not be optimal due to an inadequate volume of blood received in culture bottles Performed at [facility], [address] [place], [place] [address]    Culture   Final    NO GROWTH < 24 HOURS Performed at [facility], [address], [place], [place] [address]    Report Status PENDING  Incomplete    Radiology Studies: No results found.    Danicia Terhaar T. Leatha Rohner [facility]  If 7PM-7AM, please contact night-coverage [email or web address] 08/10/2023, 1:48 PM

## 2023-08-11 LAB — COMPREHENSIVE METABOLIC PANEL
ALT: 21 U/L (ref 0–44)
AST: 20 U/L (ref 15–41)
Albumin: 3.5 g/dL (ref 3.5–5.0)
Alkaline Phosphatase: 78 U/L (ref 38–126)
Anion gap: 9 (ref 5–15)
BUN: 17 mg/dL (ref 6–20)
CO2: 28 mmol/L (ref 22–32)
Calcium: 8.9 mg/dL (ref 8.9–10.3)
Chloride: 100 mmol/L (ref 98–111)
Creatinine, Ser: 0.61 mg/dL (ref 0.44–1.00)
GFR, Estimated: >60 mL/min (ref >60)
Glucose, Bld: 119 mg/dL — ABNORMAL HIGH (ref 70–99)
Potassium: 3.9 mmol/L (ref 3.5–5.1)
Sodium: 137 mmol/L (ref 135–145)
Total Bilirubin: 0.4 mg/dL (ref <1.2)
Total Protein: 6.3 g/dL — ABNORMAL LOW (ref 6.5–8.1)

## 2023-08-11 LAB — CULTURE, BLOOD (ROUTINE X 2)

## 2023-08-11 LAB — CBC
HCT: 47.6 % — ABNORMAL HIGH (ref 36.0–46.0)
Hemoglobin: 15.1 g/dL — ABNORMAL HIGH (ref 12.0–15.0)
MCH: 32.8 pg (ref 26.0–34.0)
MCHC: 31.7 g/dL (ref 30.0–36.0)
MCV: 103.3 fL — ABNORMAL HIGH (ref 80.0–100.0)
Platelets: 333 10*3/uL (ref 150–400)
RBC: 4.61 MIL/uL (ref 3.87–5.11)
RDW: 13.6 % (ref 11.5–15.5)
WBC: 13.1 10*3/uL — ABNORMAL HIGH (ref 4.0–10.5)
nRBC: 0 % (ref 0.0–0.2)

## 2023-08-11 LAB — MAGNESIUM: Magnesium: 2.2 mg/dL (ref 1.7–2.4)

## 2023-08-11 MED ORDER — MOMETASONE FURO-FORMOTEROL FUM 200-5 MCG/ACT IN AERO
2.0000 | INHALATION_SPRAY | Freq: Two times a day (BID) | RESPIRATORY_TRACT | Status: DC
  Administered 2023-08-11 – 2023-08-17 (×13): 2 via RESPIRATORY_TRACT
  Filled 2023-08-11: qty 8.8

## 2023-08-11 MED ORDER — IPRATROPIUM-ALBUTEROL 0.5-2.5 (3) MG/3ML IN SOLN
3.0000 mL | RESPIRATORY_TRACT | Status: DC | PRN
  Administered 2023-08-14 – 2023-08-17 (×3): 3 mL via RESPIRATORY_TRACT
  Filled 2023-08-11 (×3): qty 3

## 2023-08-11 MED ORDER — UMECLIDINIUM BROMIDE 62.5 MCG/ACT IN AEPB
1.0000 | INHALATION_SPRAY | Freq: Every day | RESPIRATORY_TRACT | Status: DC
  Administered 2023-08-11 – 2023-08-17 (×7): 1 via RESPIRATORY_TRACT
  Filled 2023-08-11 (×2): qty 7

## 2023-08-11 NOTE — Plan of Care (Signed)
  Problem: Education: Goal: Knowledge of General Education information will improve Description: Including pain rating scale, medication(s)/side effects and non-pharmacologic comfort measures Outcome: Progressing   Problem: Health Behavior/Discharge Planning: Goal: Ability to manage health-related needs will improve Outcome: Progressing   Problem: Clinical Measurements: Goal: Ability to maintain clinical measurements within normal limits will improve Outcome: Progressing Goal: Will remain free from infection Outcome: Progressing Goal: Diagnostic test results will improve Outcome: Progressing Goal: Respiratory complications will improve Outcome: Progressing Goal: Cardiovascular complication will be avoided Outcome: Progressing   Problem: Activity: Goal: Risk for activity intolerance will decrease Outcome: Progressing   Problem: Nutrition: Goal: Adequate nutrition will be maintained Outcome: Completed/Met   Problem: Coping: Goal: Level of anxiety will decrease Outcome: Progressing   Problem: Elimination: Goal: Will not experience complications related to bowel motility Outcome: Completed/Met   Problem: Pain Management: Goal: General experience of comfort will improve Outcome: Progressing   Problem: Safety: Goal: Ability to remain free from injury will improve Outcome: Progressing   Problem: Skin Integrity: Goal: Risk for impaired skin integrity will decrease Outcome: Progressing   Problem: Activity: Goal: Ability to tolerate increased activity will improve Outcome: Progressing   Problem: Clinical Measurements: Goal: Ability to maintain a body temperature in the normal range will improve Outcome: Progressing   Problem: Respiratory: Goal: Ability to maintain adequate ventilation will improve Outcome: Progressing Goal: Ability to maintain a clear airway will improve Outcome: Progressing   Problem: Activity: Goal: Ability to tolerate increased activity will  improve Outcome: Progressing   Problem: Clinical Measurements: Goal: Ability to maintain a body temperature in the normal range will improve Outcome: Progressing   Problem: Respiratory: Goal: Ability to maintain adequate ventilation will improve Outcome: Progressing Goal: Ability to maintain a clear airway will improve Outcome: Progressing

## 2023-08-11 NOTE — Progress Notes (Signed)
ID brief note  Afebrile     Latest Ref Rng & Units 08/11/2023    3:27 AM 08/09/2023    3:27 AM 08/07/2023    3:43 AM  CBC  WBC 4.0 - 10.5 K/uL 13.1  10.6  6.4   Hemoglobin 12.0 - 15.0 g/dL 16.1  09.6  04.5   Hematocrit 36.0 - 46.0 % 47.6  43.1  40.1   Platelets 150 - 400 K/uL 333  333  273       Latest Ref Rng & Units 08/11/2023    3:27 AM 08/09/2023    3:27 AM 08/07/2023    3:43 AM  CMP  Glucose 70 - 99 mg/dL 409  811  88   BUN 6 - 20 mg/dL 17  15  8    Creatinine 0.44 - 1.00 mg/dL 9.14  7.82  9.56   Sodium 135 - 145 mmol/L 137  137  139   Potassium 3.5 - 5.1 mmol/L 3.9  3.8  3.7   Chloride 98 - 111 mmol/L 100  100  103   CO2 22 - 32 mmol/L 28  29  27    Calcium 8.9 - 10.3 mg/dL 8.9  8.4  8.9   Total Protein 6.5 - 8.1 g/dL 6.3     Total Bilirubin <1.2 mg/dL 0.4     Alkaline Phos 38 - 126 U/L 78     AST 15 - 41 U/L 20     ALT 0 - 44 U/L 21      Results for orders placed or performed during the hospital encounter of 08/06/23  Blood culture (routine x 2)     Status: Abnormal   Collection Time: 08/06/23  6:05 PM   Specimen: BLOOD RIGHT FOREARM  Result Value Ref Range Status   Specimen Description   Final    BLOOD RIGHT FOREARM Performed at [facility], [address] [place], [place] [address]    Special Requests   Final    BOTTLES DRAWN AEROBIC AND ANAEROBIC Blood Culture results may not be optimal due to an inadequate volume of blood received in culture bottles Performed at [facility], [address] [place], [place] [address]    Culture  Setup Time   Final    GRAM POSITIVE COCCI ANAEROBIC BOTTLE ONLY CRITICAL RESULT CALLED TO, READ BACK BY AND VERIFIED WITH: PHARMD MELISSA JAMES ON 08/07/23 @ 1709 BY DRT Performed at [facility], [address], [place], [place] [address]    Culture (A)  Final    STAPHYLOCOCCUS EPIDERMIDIS Two isolates with different morphologies were identified as the same organism.The most resistant organism  was reported. STAPHYLOCOCCUS LUGDUNENSIS    Report Status 08/11/2023 FINAL  Final   Organism ID, Bacteria STAPHYLOCOCCUS EPIDERMIDIS  Final   Organism ID, Bacteria STAPHYLOCOCCUS LUGDUNENSIS  Final      Susceptibility   Staphylococcus epidermidis - MIC*    CIPROFLOXACIN <=0.5 SENSITIVE Sensitive     ERYTHROMYCIN <=0.25 SENSITIVE Sensitive     GENTAMICIN <=0.5 SENSITIVE Sensitive     OXACILLIN <=0.25 SENSITIVE Sensitive     TETRACYCLINE >=16 RESISTANT Resistant     VANCOMYCIN 2 SENSITIVE Sensitive     TRIMETH/SULFA <=10 SENSITIVE Sensitive     CLINDAMYCIN <=0.25 SENSITIVE Sensitive     RIFAMPIN <=0.5 SENSITIVE Sensitive     Inducible Clindamycin NEGATIVE Sensitive     * STAPHYLOCOCCUS EPIDERMIDIS   Staphylococcus lugdunensis - MIC*    CIPROFLOXACIN <=0.5 SENSITIVE Sensitive  ERYTHROMYCIN <=0.25 SENSITIVE Sensitive     GENTAMICIN <=0.5 SENSITIVE Sensitive     OXACILLIN 2 SENSITIVE Sensitive     TETRACYCLINE <=1 SENSITIVE Sensitive     VANCOMYCIN <=0.5 SENSITIVE Sensitive     TRIMETH/SULFA <=10 SENSITIVE Sensitive     CLINDAMYCIN <=0.25 SENSITIVE Sensitive     RIFAMPIN <=0.5 SENSITIVE Sensitive     Inducible Clindamycin NEGATIVE Sensitive     * STAPHYLOCOCCUS LUGDUNENSIS  Blood Culture ID Panel (Reflexed)     Status: Abnormal   Collection Time: 08/06/23  6:05 PM  Result Value Ref Range Status   Enterococcus faecalis NOT DETECTED NOT DETECTED Final   Enterococcus Faecium NOT DETECTED NOT DETECTED Final   Listeria monocytogenes NOT DETECTED NOT DETECTED Final   Staphylococcus species DETECTED (A) NOT DETECTED Final    Comment: CRITICAL RESULT CALLED TO, READ BACK BY AND VERIFIED WITH: PHARMD MELISSA JAMES ON 08/07/23 @ 1709 BY DRT    Staphylococcus aureus (BCID) NOT DETECTED NOT DETECTED Final   Staphylococcus epidermidis DETECTED (A) NOT DETECTED Final    Comment: Methicillin (oxacillin) resistant coagulase negative staphylococcus. Possible blood culture contaminant (unless  isolated from more than one blood culture draw or clinical case suggests pathogenicity). No antibiotic treatment is indicated for blood  culture contaminants. CRITICAL RESULT CALLED TO, READ BACK BY AND VERIFIED WITH: PHARMD MELISSA JAMES ON 08/07/23 @ 1709 BY DRT    Staphylococcus lugdunensis DETECTED (A) NOT DETECTED Final    Comment: Methicillin (oxacillin) resistant coagulase negative staphylococcus. Possible blood culture contaminant (unless isolated from more than one blood culture draw or clinical case suggests pathogenicity). No antibiotic treatment is indicated for blood  culture contaminants. CRITICAL RESULT CALLED TO, READ BACK BY AND VERIFIED WITH: PHARMD MELISSA JAMES ON 08/07/23 @ 1709 BY DRT    Streptococcus species DETECTED (A) NOT DETECTED Final    Comment: Not Enterococcus species, Streptococcus agalactiae, Streptococcus pyogenes, or Streptococcus pneumoniae. CRITICAL RESULT CALLED TO, READ BACK BY AND VERIFIED WITH: PHARMD MELISSA JAMES ON 08/07/23 @ 1709 BY DRT    Streptococcus agalactiae NOT DETECTED NOT DETECTED Final   Streptococcus pneumoniae NOT DETECTED NOT DETECTED Final   Streptococcus pyogenes NOT DETECTED NOT DETECTED Final   A.calcoaceticus-baumannii NOT DETECTED NOT DETECTED Final   Bacteroides fragilis NOT DETECTED NOT DETECTED Final   Enterobacterales NOT DETECTED NOT DETECTED Final   Enterobacter cloacae complex NOT DETECTED NOT DETECTED Final   Escherichia coli NOT DETECTED NOT DETECTED Final   Klebsiella aerogenes NOT DETECTED NOT DETECTED Final   Klebsiella oxytoca NOT DETECTED NOT DETECTED Final   Klebsiella pneumoniae NOT DETECTED NOT DETECTED Final   Proteus species NOT DETECTED NOT DETECTED Final   Salmonella species NOT DETECTED NOT DETECTED Final   Serratia marcescens NOT DETECTED NOT DETECTED Final   Haemophilus influenzae NOT DETECTED NOT DETECTED Final   Neisseria meningitidis NOT DETECTED NOT DETECTED Final   Pseudomonas aeruginosa NOT  DETECTED NOT DETECTED Final   Stenotrophomonas maltophilia NOT DETECTED NOT DETECTED Final   Candida albicans NOT DETECTED NOT DETECTED Final   Candida auris NOT DETECTED NOT DETECTED Final   Candida glabrata NOT DETECTED NOT DETECTED Final   Candida krusei NOT DETECTED NOT DETECTED Final   Candida parapsilosis NOT DETECTED NOT DETECTED Final   Candida tropicalis NOT DETECTED NOT DETECTED Final   Cryptococcus neoformans/gattii NOT DETECTED NOT DETECTED Final   Methicillin resistance mecA/C DETECTED (A) NOT DETECTED Final    Comment: CRITICAL RESULT CALLED TO, READ BACK BY AND VERIFIED WITH: PHARMD MELISSA  JAMES ON 08/07/23 @ 1709 BY DRT Performed at [facility], [address], [place], [place] [address]   Blood culture (routine x 2)     Status: Abnormal   Collection Time: 08/06/23  6:15 PM   Specimen: BLOOD  Result Value Ref Range Status   Specimen Description   Final    BLOOD BLOOD RIGHT WRIST Performed at [facility], [address] [place], [place] [address]    Special Requests   Final    BOTTLES DRAWN AEROBIC AND ANAEROBIC Blood Culture results may not be optimal due to an inadequate volume of blood received in culture bottles Performed at [facility], [address] [place], [place] [address]    Culture  Setup Time   Final    GRAM POSITIVE COCCI IN CLUSTERS ANAEROBIC BOTTLE ONLY CRITICAL VALUE NOTED.  VALUE IS CONSISTENT WITH PREVIOUSLY REPORTED AND CALLED VALUE. Performed at [place] [facility], [address], [place], [place] [address]    Culture STAPHYLOCOCCUS EPIDERMIDIS (A)  Final   Report Status 08/09/2023 FINAL  Final   Organism ID, Bacteria STAPHYLOCOCCUS EPIDERMIDIS  Final      Susceptibility   Staphylococcus epidermidis - MIC*    CIPROFLOXACIN <=0.5 SENSITIVE Sensitive     ERYTHROMYCIN <=0.25 SENSITIVE Sensitive     GENTAMICIN <=0.5 SENSITIVE Sensitive     OXACILLIN <=0.25 SENSITIVE Sensitive     TETRACYCLINE  <=1 SENSITIVE Sensitive     VANCOMYCIN 1 SENSITIVE Sensitive     TRIMETH/SULFA <=10 SENSITIVE Sensitive     CLINDAMYCIN <=0.25 SENSITIVE Sensitive     RIFAMPIN <=0.5 SENSITIVE Sensitive     Inducible Clindamycin NEGATIVE Sensitive     * STAPHYLOCOCCUS EPIDERMIDIS  Resp panel by RT-PCR (RSV, Flu A&B, Covid) Anterior Nasal Swab     Status: None   Collection Time: 08/07/23 12:51 AM   Specimen: Anterior Nasal Swab  Result Value Ref Range Status   SARS Coronavirus 2 by RT PCR NEGATIVE NEGATIVE Final    Comment: (NOTE) SARS-CoV-2 target nucleic acids are NOT DETECTED.  The SARS-CoV-2 RNA is generally detectable in upper respiratory specimens during the acute phase of infection. The lowest concentration of SARS-CoV-2 viral copies this assay can detect is 138 copies/mL. A negative result does not preclude SARS-Cov-2 infection and should not be used as the sole basis for treatment or other patient management decisions. A negative result may occur with  improper specimen collection/handling, submission of specimen other than nasopharyngeal swab, presence of viral mutation(s) within the areas targeted by this assay, and inadequate number of viral copies(<138 copies/mL). A negative result must be combined with clinical observations, patient history, and epidemiological information. The expected result is Negative.  Fact Sheet for Patients:  BloggerCourse.com  Fact Sheet for Healthcare Providers:  SeriousBroker.it  This test is no t yet approved or cleared by the [place] FDA and  has been authorized for detection and/or diagnosis of SARS-CoV-2 by FDA under an Emergency Use Authorization (EUA). This EUA will remain  in effect (meaning this test can be used) for the duration of the COVID-19 declaration under Section 564(b)(1) of the Act, 21 U.S.C.section 360bbb-3(b)(1), unless the authorization is terminated  or revoked sooner.        Influenza A by PCR NEGATIVE NEGATIVE Final   Influenza B by PCR NEGATIVE NEGATIVE Final    Comment: (NOTE) The Xpert Xpress SARS-CoV-2/FLU/RSV plus assay is intended as an aid in the diagnosis of influenza from Nasopharyngeal swab  specimens and should not be used as a sole basis for treatment. Nasal washings and aspirates are unacceptable for Xpert Xpress SARS-CoV-2/FLU/RSV testing.  Fact Sheet for Patients: BloggerCourse.com  Fact Sheet for Healthcare Providers: SeriousBroker.it  This test is not yet approved or cleared by the [place] FDA and has been authorized for detection and/or diagnosis of SARS-CoV-2 by FDA under an Emergency Use Authorization (EUA). This EUA will remain in effect (meaning this test can be used) for the duration of the COVID-19 declaration under Section 564(b)(1) of the Act, 21 U.S.C. section 360bbb-3(b)(1), unless the authorization is terminated or revoked.     Resp Syncytial Virus by PCR NEGATIVE NEGATIVE Final    Comment: (NOTE) Fact Sheet for Patients: BloggerCourse.com  Fact Sheet for Healthcare Providers: SeriousBroker.it  This test is not yet approved or cleared by the [place] FDA and has been authorized for detection and/or diagnosis of SARS-CoV-2 by FDA under an Emergency Use Authorization (EUA). This EUA will remain in effect (meaning this test can be used) for the duration of the COVID-19 declaration under Section 564(b)(1) of the Act, 21 U.S.C. section 360bbb-3(b)(1), unless the authorization is terminated or revoked.  Performed at [facility], [address], [place], [place] [address]   Expectorated Sputum Assessment w Gram Stain, Rflx to Resp Cult     Status: None   Collection Time: 08/07/23  4:39 AM   Specimen: Expectorated Sputum  Result Value Ref Range Status   Specimen Description EXPECTORATED SPUTUM   Final   Special Requests NONE  Final   Sputum evaluation   Final    Sputum specimen not acceptable for testing.  Please recollect.   NOTIFIED Janace Hoard RN AT [phone] ON 08/07/2023 BY MECIAL J. Performed at [facility], [address] [place], [place] [address]    Report Status 08/07/2023 FINAL  Final  Culture, blood (Routine X 2) w Reflex to ID Panel     Status: None (Preliminary result)   Collection Time: 08/08/23  9:29 AM   Specimen: BLOOD  Result Value Ref Range Status   Specimen Description   Final    BLOOD SITE NOT SPECIFIED Performed at [facility], [address] [place], [place] [address]    Special Requests   Final    BOTTLES DRAWN AEROBIC AND ANAEROBIC Blood Culture results may not be optimal due to an inadequate volume of blood received in culture bottles Performed at [facility], [address], [place], [place] [address]    Culture   Final    NO GROWTH 3 DAYS Performed at [facility], [address], [place], [place] [address]    Report Status PENDING  Incomplete  Culture, blood (Routine X 2) w Reflex to ID Panel     Status: None (Preliminary result)   Collection Time: 08/08/23  9:41 AM   Specimen: BLOOD  Result Value Ref Range Status   Specimen Description   Final    BLOOD SITE NOT SPECIFIED Performed at [facility], [address], [place], [place] [address]    Special Requests   Final    BOTTLES DRAWN AEROBIC AND ANAEROBIC Blood Culture results may not be optimal due to an inadequate volume of blood received in culture bottles Performed at [facility], [address], [place], [place] [address]    Culture   Final    NO GROWTH 3 DAYS Performed at [facility], [address] [place], [place] [address]    Report Status PENDING  Incomplete   Staph lugdunensis is Oxacillin S. Will DC Vancomycin Continue ceftriaxone  Dr Daiva Eves on starting  12/16  Odette Fraction, MD Infectious Disease Physician [facility] [address] [place], [place] [address] Phone: [phone]  Fax: [phone]

## 2023-08-11 NOTE — Plan of Care (Signed)
  Problem: Coping: Goal: Level of anxiety will decrease Outcome: Progressing   Problem: Safety: Goal: Ability to remain free from injury will improve Outcome: Progressing   Problem: Respiratory: Goal: Ability to maintain adequate ventilation will improve Outcome: Progressing Goal: Ability to maintain a clear airway will improve Outcome: Progressing

## 2023-08-11 NOTE — Progress Notes (Signed)
PROGRESS NOTE  Stacy Bolton:[id number] DOB: 09/24/1970   PCP: Patient, No Pcp Per  Patient is from: [facility] shelter   DOA: 08/06/2023 LOS: 5  Chief complaints Chief Complaint  Patient presents with   Shortness of Breath     Brief Narrative / Interim history: 52 year old F with PMH of COPD, anxiety, bipolar disorder, polysubstance use including EtOH, cocaine, THC and tobacco, homelessness and recent left arm fracture presenting with shortness of breath, productive cough with yellowish phlegm, fever, chills and diaphoresis and admitted with community-acquired pneumonia and COPD exacerbation.  Checks x-ray showed RML infiltrate.  Patient smokes about a pack a day.  She was started on ceftriaxone, Zithromax, steroid and breathing treatments and admitted.  Blood culture with Staph epidermidis, lugdunensis and Streptococcus species. Vancomycin added.  Repeat blood culture NGTD.  TTE without significant finding.  Remains on vancomycin and ceftriaxone.  ID following.  Subjective: Seen and examined earlier this morning.  No major events overnight of this morning.  No specific complaints.  Objective: Vitals:   08/10/23 2239 08/11/23 0235 08/11/23 0629 08/11/23 0844  BP: 108/74 103/75 112/83   Pulse: 93 81 98   Resp: 18 16 17    Temp: (!) 97.5 F (36.4 C) 97.6 F (36.4 C) 97.6 F (36.4 C)   TempSrc: Axillary Axillary Axillary   SpO2: 95% 94% 95% 95%  Weight:      Height:        Examination:  GENERAL: No apparent distress.  Nontoxic. HEENT: MMM.  Vision and hearing grossly intact. NECK: Supple.  No apparent JVD.  RESP:  No IWOB.  Fair aeration bilaterally. CVS:  RRR. Heart sounds normal.  ABD/GI/GU: BS+. Abd soft, NTND.  MSK/EXT:  Moves extremities.  Left forearm in splint. SKIN: no apparent skin lesion or wound NEURO: Awake, alert and oriented appropriately.  No apparent focal neuro deficit. PSYCH: Appears anxious.  Procedures:   None  Microbiology summarized: COVID-19, influenza and RSV PCR nonreactive Sputum culture pending Blood culture with Staph epidermidis, lugdunensis and Streptococcus species Repeat blood culture NGTD.  Assessment and plan: Community-acquired pneumonia of the right middle lobe: CXR with RML infiltrate.  Reportedly hypoxic to 80s on RA prior to arrival to ED. Still with productive cough, rhonchi and chest congestion.  Smokes about a pack a day.  COVID-19, influenza and RSV PCR nonreactive.  Blood cultures as above.  Strep pneumo and Legionella antigen negative. -Continue IV ceftriaxone and vancomycin -Wean oxygen as able -IS, FV, mucolytics   COPD with acute exacerbation: Likely due to the above and smoking.  Improved. -Completed 5 days of steroid -Discontinue Brovana and budesonide.  Start Dulera and Incruse Ellipta -Continue DuoNebs as needed -Antibiotics as above -Encouraged smoking cessation.  Polymicrobial bacteremia: Blood culture with Staph epidermidis, lugdunensis and Streptococcus species.  Denies IVDU.  Repeat blood culture NGTD.  Hepatitis panel negative.  Pro-Cal negative.  TTE without vegetation. -On vancomycin and ceftriaxone. -ID following   Polysubstance substance use disorder: Smokes about a pack a day.  Also admits to using cocaine and drinking alcohol but not quantifying.  Denies IVDU.  UDS positive for opiate, cocaine and benzodiazepine.  Seems to have some withdrawal with nasal congestion, anxiety... -Encouraged smoking cessation and refraining from cocaine and alcohol use -Ativan as needed -Continue nicotine patch -Continue thiamine, folate, multivitamins -TOC following.   Bipolar disorder/anxiety: Seen by psychiatry on last admission and recommended discontinuation of Depakote and paroxetine due to medication noncompliance and ongoing substance use.  Appears  anxious partly due to steroid. -Ativan as above -Trazodone 50 mg nightly as needed -Discontinue  steroid.   Distal left radial fracture: after fall on 12/2.  Seen by orthopedics Dr. Alena Bills and scheduled for ORIF 12/19. -Pain control with as needed Tylenol, tramadol and oxycodone  Nasal congestion: Likely due to withdrawal. -Continue Afrin, Flonase and Claritin  Pruritus/skin itch -P.o. Benadryl as needed.  Avoid IV  Rectal prolapse: Chronic issue. -Avoid constipation -Outpatient follow-up  Underweight Body mass index is 18.01 kg/m. Consult dietitian          DVT prophylaxis:  enoxaparin (LOVENOX) injection 40 mg Start: 08/06/23 2200  Code Status: Full code Family Communication: None at bedside Level of care: Med-Surg Status is: Inpatient Remains inpatient appropriate because: Community-acquired pneumonia, COPD exacerbation and bacteremia   Final disposition: Homeless shelter Consultants:  Infectious disease  35 minutes with more than 50% spent in reviewing records, counseling patient/family and coordinating care.   Sch Meds:  Scheduled Meds:  acetaminophen  650 mg Oral Q6H WA   arformoterol  15 mcg Nebulization BID   budesonide (PULMICORT) nebulizer solution  0.25 mg Nebulization BID   carbamide peroxide  5 drop Both EARS BID   enoxaparin (LOVENOX) injection  40 mg Subcutaneous Q24H   fluticasone  1 spray Each Nare Daily   folic acid  1 mg Oral Daily   guaiFENesin  600 mg Oral BID   loratadine  10 mg Oral Daily   methylPREDNISolone (SOLU-MEDROL) injection  40 mg Intravenous Q12H   multivitamin with minerals  1 tablet Oral Daily   nicotine  21 mg Transdermal Daily   oxymetazoline  1 spray Each Nare BID   pantoprazole  40 mg Oral Daily   thiamine  100 mg Oral Daily   Or   thiamine  100 mg Intravenous Daily   Continuous Infusions:  cefTRIAXone (ROCEPHIN)  IV 2 g (08/10/23 1753)   vancomycin 1,000 mg (08/10/23 1836)   PRN Meds:.alum & mag hydroxide-simeth, diphenhydrAMINE, ipratropium-albuterol, LORazepam, ondansetron **OR** ondansetron (ZOFRAN)  IV, oxyCODONE, senna-docusate, sodium chloride, traMADol, traZODone  Antimicrobials: Anti-infectives (From admission, onward)    Start     Dose/Rate Route Frequency Ordered Stop   08/08/23 1800  vancomycin (VANCOCIN) IVPB 1000 mg/200 mL premix        1,000 mg 200 mL/hr over 60 Minutes Intravenous Every 24 hours 08/07/23 1741     08/07/23 1800  cefTRIAXone (ROCEPHIN) 2 g in sodium chloride 0.9 % 100 mL IVPB        2 g 200 mL/hr over 30 Minutes Intravenous Every 24 hours 08/06/23 1936 08/12/23 1759   08/07/23 1800  azithromycin (ZITHROMAX) 500 mg in sodium chloride 0.9 % 250 mL IVPB  Status:  Discontinued        500 mg 250 mL/hr over 60 Minutes Intravenous Every 24 hours 08/06/23 1936 08/07/23 1142   08/07/23 1800  azithromycin (ZITHROMAX) tablet 500 mg  Status:  Discontinued        500 mg Oral Every 24 hours 08/07/23 1142 08/09/23 0916   08/07/23 1800  vancomycin (VANCOCIN) IVPB 1000 mg/200 mL premix        1,000 mg 200 mL/hr over 60 Minutes Intravenous  Once 08/07/23 1731 08/07/23 1905   08/06/23 1800  cefTRIAXone (ROCEPHIN) 1 g in sodium chloride 0.9 % 100 mL IVPB        1 g 200 mL/hr over 30 Minutes Intravenous  Once 08/06/23 1747 08/06/23 1928   08/06/23 1800  azithromycin (ZITHROMAX) 500  mg in sodium chloride 0.9 % 250 mL IVPB        500 mg 250 mL/hr over 60 Minutes Intravenous  Once 08/06/23 1747 08/06/23 1928        I have personally reviewed the following labs and images: CBC: Recent Labs  Lab 08/06/23 1249 08/07/23 0343 08/09/23 0327 08/11/23 0327  WBC 8.2 6.4 10.6* 13.1*  HGB 14.6 12.6 13.9 15.1*  HCT 43.5 40.1 43.1 47.6*  MCV 101.6* 103.9* 102.9* 103.3*  PLT 299 273 333 333   BMP &GFR Recent Labs  Lab 08/06/23 1249 08/07/23 0343 08/09/23 0327 08/11/23 0327  NA 138 139 137 137  K 3.6 3.7 3.8 3.9  CL 101 103 100 100  CO2 28 27 29 28   GLUCOSE 100* 88 108* 119*  BUN 8 8 15 17   CREATININE 0.46 0.32* 0.42* 0.61  CALCIUM 8.5* 8.9 8.4* 8.9  MG  --   --   2.5* 2.2  PHOS  --   --  3.4  --    Estimated Creatinine Clearance: 62.3 mL/min (by C-G formula based on SCr of 0.61 mg/dL). Liver & Pancreas: Recent Labs  Lab 08/06/23 1249 08/09/23 0327 08/11/23 0327  AST 19  --  20  ALT 16  --  21  ALKPHOS 82  --  78  BILITOT 1.1  --  0.4  PROT 6.5  --  6.3*  ALBUMIN 3.8 3.4* 3.5   No results for input(s): "LIPASE", "AMYLASE" in the last 168 hours.  No results for input(s): "AMMONIA" in the last 168 hours. Diabetic: No results for input(s): "HGBA1C" in the last 72 hours. No results for input(s): "GLUCAP" in the last 168 hours. Cardiac Enzymes: No results for input(s): "CKTOTAL", "CKMB", "CKMBINDEX", "TROPONINI" in the last 168 hours. No results for input(s): "PROBNP" in the last 8760 hours. Coagulation Profile: No results for input(s): "INR", "PROTIME" in the last 168 hours. Thyroid Function Tests: No results for input(s): "TSH", "T4TOTAL", "FREET4", "T3FREE", "THYROIDAB" in the last 72 hours. Lipid Profile: No results for input(s): "CHOL", "HDL", "LDLCALC", "TRIG", "CHOLHDL", "LDLDIRECT" in the last 72 hours. Anemia Panel: No results for input(s): "VITAMINB12", "FOLATE", "FERRITIN", "TIBC", "IRON", "RETICCTPCT" in the last 72 hours. Urine analysis:    Component Value Date/Time   COLORURINE YELLOW 08/01/2023 2112   APPEARANCEUR HAZY (A) 08/01/2023 2112   LABSPEC 1.025 08/01/2023 2112   PHURINE 5.0 08/01/2023 2112   GLUCOSEU NEGATIVE 08/01/2023 2112   HGBUR NEGATIVE 08/01/2023 2112   BILIRUBINUR NEGATIVE 08/01/2023 2112   KETONESUR NEGATIVE 08/01/2023 2112   PROTEINUR NEGATIVE 08/01/2023 2112   UROBILINOGEN 1.0 03/19/2010 1431   NITRITE POSITIVE (A) 08/01/2023 2112   LEUKOCYTESUR NEGATIVE 08/01/2023 2112   Sepsis Labs: Invalid input(s): "PROCALCITONIN", "LACTICIDVEN"  Microbiology: Recent Results (from the past 240 hours)  Urine Culture     Status: Abnormal   Collection Time: 08/01/23  9:12 PM   Specimen: Urine, Clean Catch   Result Value Ref Range Status   Specimen Description URINE, CLEAN CATCH  Final   Special Requests   Final    NONE Performed at [facility], [address] [place], [place] [address]    Culture >=100,000 COLONIES/mL ESCHERICHIA COLI (A)  Final   Report Status 08/04/2023 FINAL  Final   Organism ID, Bacteria ESCHERICHIA COLI (A)  Final      Susceptibility   Escherichia coli - MIC*    AMPICILLIN 4 SENSITIVE Sensitive     CEFEPIME <=0.12 SENSITIVE Sensitive  CEFTRIAXONE <=0.25 SENSITIVE Sensitive     CIPROFLOXACIN <=0.25 SENSITIVE Sensitive     GENTAMICIN <=1 SENSITIVE Sensitive     IMIPENEM <=0.25 SENSITIVE Sensitive     TRIMETH/SULFA <=20 SENSITIVE Sensitive     AMPICILLIN/SULBACTAM <=2 SENSITIVE Sensitive     PIP/TAZO <=4 SENSITIVE Sensitive ug/mL    * >=100,000 COLONIES/mL ESCHERICHIA COLI  Blood culture (routine x 2)     Status: Abnormal   Collection Time: 08/06/23  6:05 PM   Specimen: BLOOD RIGHT FOREARM  Result Value Ref Range Status   Specimen Description   Final    BLOOD RIGHT FOREARM Performed at [facility], [address], [place], [place] [address]    Special Requests   Final    BOTTLES DRAWN AEROBIC AND ANAEROBIC Blood Culture results may not be optimal due to an inadequate volume of blood received in culture bottles Performed at [facility], [address], [place], [place] [address]    Culture  Setup Time   Final    GRAM POSITIVE COCCI ANAEROBIC BOTTLE ONLY CRITICAL RESULT CALLED TO, READ BACK BY AND VERIFIED WITH: PHARMD MELISSA JAMES ON 08/07/23 @ 1709 BY DRT Performed at [facility], [address], [place], [place] [address]    Culture (A)  Final    STAPHYLOCOCCUS EPIDERMIDIS Two isolates with different morphologies were identified as the same organism.The most resistant organism was reported. STAPHYLOCOCCUS LUGDUNENSIS    Report Status 08/11/2023 FINAL  Final   Organism ID, Bacteria STAPHYLOCOCCUS EPIDERMIDIS   Final   Organism ID, Bacteria STAPHYLOCOCCUS LUGDUNENSIS  Final      Susceptibility   Staphylococcus epidermidis - MIC*    CIPROFLOXACIN <=0.5 SENSITIVE Sensitive     ERYTHROMYCIN <=0.25 SENSITIVE Sensitive     GENTAMICIN <=0.5 SENSITIVE Sensitive     OXACILLIN <=0.25 SENSITIVE Sensitive     TETRACYCLINE >=16 RESISTANT Resistant     VANCOMYCIN 2 SENSITIVE Sensitive     TRIMETH/SULFA <=10 SENSITIVE Sensitive     CLINDAMYCIN <=0.25 SENSITIVE Sensitive     RIFAMPIN <=0.5 SENSITIVE Sensitive     Inducible Clindamycin NEGATIVE Sensitive     * STAPHYLOCOCCUS EPIDERMIDIS   Staphylococcus lugdunensis - MIC*    CIPROFLOXACIN <=0.5 SENSITIVE Sensitive     ERYTHROMYCIN <=0.25 SENSITIVE Sensitive     GENTAMICIN <=0.5 SENSITIVE Sensitive     OXACILLIN 2 SENSITIVE Sensitive     TETRACYCLINE <=1 SENSITIVE Sensitive     VANCOMYCIN <=0.5 SENSITIVE Sensitive     TRIMETH/SULFA <=10 SENSITIVE Sensitive     CLINDAMYCIN <=0.25 SENSITIVE Sensitive     RIFAMPIN <=0.5 SENSITIVE Sensitive     Inducible Clindamycin NEGATIVE Sensitive     * STAPHYLOCOCCUS LUGDUNENSIS  Blood Culture ID Panel (Reflexed)     Status: Abnormal   Collection Time: 08/06/23  6:05 PM  Result Value Ref Range Status   Enterococcus faecalis NOT DETECTED NOT DETECTED Final   Enterococcus Faecium NOT DETECTED NOT DETECTED Final   Listeria monocytogenes NOT DETECTED NOT DETECTED Final   Staphylococcus species DETECTED (A) NOT DETECTED Final    Comment: CRITICAL RESULT CALLED TO, READ BACK BY AND VERIFIED WITH: PHARMD MELISSA JAMES ON 08/07/23 @ 1709 BY DRT    Staphylococcus aureus (BCID) NOT DETECTED NOT DETECTED Final   Staphylococcus epidermidis DETECTED (A) NOT DETECTED Final    Comment: Methicillin (oxacillin) resistant coagulase negative staphylococcus. Possible blood culture contaminant (unless isolated from more than one blood culture draw or clinical case suggests pathogenicity). No antibiotic  treatment is indicated for blood   culture contaminants. CRITICAL RESULT CALLED TO, READ BACK BY AND VERIFIED WITH: PHARMD MELISSA JAMES ON 08/07/23 @ 1709 BY DRT    Staphylococcus lugdunensis DETECTED (A) NOT DETECTED Final    Comment: Methicillin (oxacillin) resistant coagulase negative staphylococcus. Possible blood culture contaminant (unless isolated from more than one blood culture draw or clinical case suggests pathogenicity). No antibiotic treatment is indicated for blood  culture contaminants. CRITICAL RESULT CALLED TO, READ BACK BY AND VERIFIED WITH: PHARMD MELISSA JAMES ON 08/07/23 @ 1709 BY DRT    Streptococcus species DETECTED (A) NOT DETECTED Final    Comment: Not Enterococcus species, Streptococcus agalactiae, Streptococcus pyogenes, or Streptococcus pneumoniae. CRITICAL RESULT CALLED TO, READ BACK BY AND VERIFIED WITH: PHARMD MELISSA JAMES ON 08/07/23 @ 1709 BY DRT    Streptococcus agalactiae NOT DETECTED NOT DETECTED Final   Streptococcus pneumoniae NOT DETECTED NOT DETECTED Final   Streptococcus pyogenes NOT DETECTED NOT DETECTED Final   A.calcoaceticus-baumannii NOT DETECTED NOT DETECTED Final   Bacteroides fragilis NOT DETECTED NOT DETECTED Final   Enterobacterales NOT DETECTED NOT DETECTED Final   Enterobacter cloacae complex NOT DETECTED NOT DETECTED Final   Escherichia coli NOT DETECTED NOT DETECTED Final   Klebsiella aerogenes NOT DETECTED NOT DETECTED Final   Klebsiella oxytoca NOT DETECTED NOT DETECTED Final   Klebsiella pneumoniae NOT DETECTED NOT DETECTED Final   Proteus species NOT DETECTED NOT DETECTED Final   Salmonella species NOT DETECTED NOT DETECTED Final   Serratia marcescens NOT DETECTED NOT DETECTED Final   Haemophilus influenzae NOT DETECTED NOT DETECTED Final   Neisseria meningitidis NOT DETECTED NOT DETECTED Final   Pseudomonas aeruginosa NOT DETECTED NOT DETECTED Final   Stenotrophomonas maltophilia NOT DETECTED NOT DETECTED Final   Candida albicans NOT DETECTED NOT DETECTED  Final   Candida auris NOT DETECTED NOT DETECTED Final   Candida glabrata NOT DETECTED NOT DETECTED Final   Candida krusei NOT DETECTED NOT DETECTED Final   Candida parapsilosis NOT DETECTED NOT DETECTED Final   Candida tropicalis NOT DETECTED NOT DETECTED Final   Cryptococcus neoformans/gattii NOT DETECTED NOT DETECTED Final   Methicillin resistance mecA/C DETECTED (A) NOT DETECTED Final    Comment: CRITICAL RESULT CALLED TO, READ BACK BY AND VERIFIED WITH: PHARMD MELISSA JAMES ON 08/07/23 @ 1709 BY DRT Performed at [facility], [address] [place], [place] [address]   Blood culture (routine x 2)     Status: Abnormal   Collection Time: 08/06/23  6:15 PM   Specimen: BLOOD  Result Value Ref Range Status   Specimen Description   Final    BLOOD BLOOD RIGHT WRIST Performed at [facility], [address], [place], [place] [address]    Special Requests   Final    BOTTLES DRAWN AEROBIC AND ANAEROBIC Blood Culture results may not be optimal due to an inadequate volume of blood received in culture bottles Performed at [facility], [address] [place], [place] [address]    Culture  Setup Time   Final    GRAM POSITIVE COCCI IN CLUSTERS ANAEROBIC BOTTLE ONLY CRITICAL VALUE NOTED.  VALUE IS CONSISTENT WITH PREVIOUSLY REPORTED AND CALLED VALUE. Performed at [facility], [address], [place], [place] [address]    Culture STAPHYLOCOCCUS EPIDERMIDIS (A)  Final   Report Status 08/09/2023 FINAL  Final   Organism ID, Bacteria STAPHYLOCOCCUS EPIDERMIDIS  Final      Susceptibility   Staphylococcus epidermidis - MIC*  CIPROFLOXACIN <=0.5 SENSITIVE Sensitive     ERYTHROMYCIN <=0.25 SENSITIVE Sensitive     GENTAMICIN <=0.5 SENSITIVE Sensitive     OXACILLIN <=0.25 SENSITIVE Sensitive     TETRACYCLINE <=1 SENSITIVE Sensitive     VANCOMYCIN 1 SENSITIVE Sensitive     TRIMETH/SULFA <=10 SENSITIVE Sensitive     CLINDAMYCIN <=0.25 SENSITIVE  Sensitive     RIFAMPIN <=0.5 SENSITIVE Sensitive     Inducible Clindamycin NEGATIVE Sensitive     * STAPHYLOCOCCUS EPIDERMIDIS  Resp panel by RT-PCR (RSV, Flu A&B, Covid) Anterior Nasal Swab     Status: None   Collection Time: 08/07/23 12:51 AM   Specimen: Anterior Nasal Swab  Result Value Ref Range Status   SARS Coronavirus 2 by RT PCR NEGATIVE NEGATIVE Final    Comment: (NOTE) SARS-CoV-2 target nucleic acids are NOT DETECTED.  The SARS-CoV-2 RNA is generally detectable in upper respiratory specimens during the acute phase of infection. The lowest concentration of SARS-CoV-2 viral copies this assay can detect is 138 copies/mL. A negative result does not preclude SARS-Cov-2 infection and should not be used as the sole basis for treatment or other patient management decisions. A negative result may occur with  improper specimen collection/handling, submission of specimen other than nasopharyngeal swab, presence of viral mutation(s) within the areas targeted by this assay, and inadequate number of viral copies(<138 copies/mL). A negative result must be combined with clinical observations, patient history, and epidemiological information. The expected result is Negative.  Fact Sheet for Patients:  BloggerCourse.com  Fact Sheet for Healthcare Providers:  SeriousBroker.it  This test is no t yet approved or cleared by the [place] FDA and  has been authorized for detection and/or diagnosis of SARS-CoV-2 by FDA under an Emergency Use Authorization (EUA). This EUA will remain  in effect (meaning this test can be used) for the duration of the COVID-19 declaration under Section 564(b)(1) of the Act, 21 U.S.C.section 360bbb-3(b)(1), unless the authorization is terminated  or revoked sooner.       Influenza A by PCR NEGATIVE NEGATIVE Final   Influenza B by PCR NEGATIVE NEGATIVE Final    Comment: (NOTE) The Xpert Xpress  SARS-CoV-2/FLU/RSV plus assay is intended as an aid in the diagnosis of influenza from Nasopharyngeal swab specimens and should not be used as a sole basis for treatment. Nasal washings and aspirates are unacceptable for Xpert Xpress SARS-CoV-2/FLU/RSV testing.  Fact Sheet for Patients: BloggerCourse.com  Fact Sheet for Healthcare Providers: SeriousBroker.it  This test is not yet approved or cleared by the [place] FDA and has been authorized for detection and/or diagnosis of SARS-CoV-2 by FDA under an Emergency Use Authorization (EUA). This EUA will remain in effect (meaning this test can be used) for the duration of the COVID-19 declaration under Section 564(b)(1) of the Act, 21 U.S.C. section 360bbb-3(b)(1), unless the authorization is terminated or revoked.     Resp Syncytial Virus by PCR NEGATIVE NEGATIVE Final    Comment: (NOTE) Fact Sheet for Patients: BloggerCourse.com  Fact Sheet for Healthcare Providers: SeriousBroker.it  This test is not yet approved or cleared by the [place] FDA and has been authorized for detection and/or diagnosis of SARS-CoV-2 by FDA under an Emergency Use Authorization (EUA). This EUA will remain in effect (meaning this test can be used) for the duration of the COVID-19 declaration under Section 564(b)(1) of the Act, 21 U.S.C. section 360bbb-3(b)(1), unless the authorization is terminated or revoked.  Performed at [facility], [address] [place],  [place] [address]   Expectorated Sputum Assessment w Gram Stain, Rflx to Resp Cult     Status: None   Collection Time: 08/07/23  4:39 AM   Specimen: Expectorated Sputum  Result Value Ref Range Status   Specimen Description EXPECTORATED SPUTUM  Final   Special Requests NONE  Final   Sputum evaluation   Final    Sputum specimen not acceptable for testing.  Please  recollect.   NOTIFIED Janace Hoard RN AT [phone] ON 08/07/2023 BY MECIAL J. Performed at [facility], [address], [place], [place] [address]    Report Status 08/07/2023 FINAL  Final  Culture, blood (Routine X 2) w Reflex to ID Panel     Status: None (Preliminary result)   Collection Time: 08/08/23  9:29 AM   Specimen: BLOOD  Result Value Ref Range Status   Specimen Description   Final    BLOOD SITE NOT SPECIFIED Performed at [facility], [address] [place], [place] [address]    Special Requests   Final    BOTTLES DRAWN AEROBIC AND ANAEROBIC Blood Culture results may not be optimal due to an inadequate volume of blood received in culture bottles Performed at [facility], [address] [place], [place] [address]    Culture   Final    NO GROWTH 3 DAYS Performed at [facility], [address] [place], [place] [address]    Report Status PENDING  Incomplete  Culture, blood (Routine X 2) w Reflex to ID Panel     Status: None (Preliminary result)   Collection Time: 08/08/23  9:41 AM   Specimen: BLOOD  Result Value Ref Range Status   Specimen Description   Final    BLOOD SITE NOT SPECIFIED Performed at [facility], [address] [place], [place] [address]    Special Requests   Final    BOTTLES DRAWN AEROBIC AND ANAEROBIC Blood Culture results may not be optimal due to an inadequate volume of blood received in culture bottles Performed at [facility], [address], [place], [place] [address]    Culture   Final    NO GROWTH 3 DAYS Performed at [facility], [address], [place], [place] [address]    Report Status PENDING  Incomplete    Radiology Studies: No results found.    Lille Karim T. [employer]  If 7PM-7AM, please contact night-coverage [email or web address] 08/11/2023, 12:29 PM

## 2023-08-12 LAB — URINE CYTOLOGY ANCILLARY ONLY
Chlamydia: NEGATIVE
Comment: NEGATIVE
Comment: NORMAL
Neisseria Gonorrhea: NEGATIVE

## 2023-08-12 MED ORDER — CEFAZOLIN SODIUM-DEXTROSE 2-4 GM/100ML-% IV SOLN
2.0000 g | Freq: Three times a day (TID) | INTRAVENOUS | Status: DC
  Administered 2023-08-12 – 2023-08-16 (×12): 2 g via INTRAVENOUS
  Filled 2023-08-12 (×11): qty 100

## 2023-08-12 MED ORDER — ENSURE ENLIVE PO LIQD
237.0000 mL | Freq: Three times a day (TID) | ORAL | Status: DC
  Administered 2023-08-12 – 2023-08-17 (×16): 237 mL via ORAL

## 2023-08-12 MED ORDER — CEFAZOLIN SODIUM-DEXTROSE 2-4 GM/100ML-% IV SOLN: 2.0000 g | Freq: Three times a day (TID) | INTRAVENOUS | Status: DC

## 2023-08-12 NOTE — Progress Notes (Signed)
Patient sleeping well tonight, no respiratory distress noted, has been independent to the bathroom, remains on room air with o2 sats ranging from 91-95% when spot checked, continues to have a productive cough with white/clear sputum witnessed multiple times.

## 2023-08-12 NOTE — Progress Notes (Signed)
   08/12/23 1400  Spiritual Encounters  Type of Visit Initial  Care provided to: Patient  Referral source Chaplain assessment;Clinical staff  Reason for visit Urgent spiritual support  OnCall Visit No  Spiritual Framework  Presenting Themes Meaning/purpose/sources of inspiration;Values and beliefs;Significant life change;Impactful experiences and emotions;Courage hope and growth;Rituals and practive  Values/beliefs  Chartered certified accountant evangelical faith professed)  Patient Stress Factors Health changes;Lack of caregivers;Loss of control;Major life changes;Financial concerns  Family Stress Factors None identified  Intervention Outcomes  Outcomes Connection to spiritual care;Autonomy/agency;Awareness of health;Awareness of support;Reduced anxiety;Reduced fear;Reduced isolation  Spiritual Care Plan  Spiritual Care Issues Still Outstanding Chaplain will continue to follow   Chaplain met with patient at bedside this afternoon - she was eager to talk and shared her life and faith narrative.  She expressed grief over the recent loss of her closest sister "Polly" and wept at her sharing of how close they were.  She expressed emotional and spiritual Delima of what to do after surgery.  Did not exhibit distress emotionally but shared her fears appropriately.

## 2023-08-12 NOTE — Progress Notes (Signed)
PROGRESS NOTE  Stacy Bolton:[id number] DOB: Nov 23, 1970   PCP: Patient, No Pcp Per  Patient is from: [facility] shelter   DOA: 08/06/2023 LOS: 6  Chief complaints Chief Complaint  Patient presents with   Shortness of Breath     Brief Narrative / Interim history: 52 year old F with PMH of COPD, anxiety, bipolar disorder, polysubstance use including EtOH, cocaine, THC and tobacco, homelessness and recent left arm fracture presenting with shortness of breath, productive cough with yellowish phlegm, fever, chills and diaphoresis and admitted with community-acquired pneumonia and COPD exacerbation.  Checks x-ray showed RML infiltrate.  Patient smokes about a pack a day.  She was started on ceftriaxone, Zithromax, steroid and breathing treatments and admitted.  Blood culture with Staph epidermidis with different sensitivity patterns, Staph lugdunensis in 1 out of 2 bottles and Streptococcus species. Vancomycin added.  Repeat blood culture NGTD.  TTE without significant finding.  Antibiotics de-escalated to Ancef.  ID following.  Subjective: Seen and examined earlier this morning.  No major events overnight of this morning.  Reports nasal and chest congestion and shortness of breath but read me two pages of poem without difficulty.  Objective: Vitals:   08/12/23 0613 08/12/23 0909 08/12/23 1340 08/12/23 1520  BP: 113/83  (!) 113/92   Pulse: 93  99 98  Resp: 18  16   Temp: 97.7 F (36.5 C)  98.4 F (36.9 C)   TempSrc: Oral  Oral   SpO2: 94% 95% 94%   Weight:      Height:        Examination:  GENERAL: No apparent distress.  Nontoxic. HEENT: MMM.  Vision and hearing grossly intact.  Nasal congestion. NECK: Supple.  No apparent JVD.  RESP:  No IWOB.  Fair aeration bilaterally.  Some rhonchi bilaterally. CVS:  RRR. Heart sounds normal.  ABD/GI/GU: BS+. Abd soft, NTND.  MSK/EXT:  Moves extremities.  Left forearm in splint. SKIN: no apparent skin  lesion or wound NEURO: Awake, alert and oriented appropriately.  No apparent focal neuro deficit. PSYCH: Appears anxious.  Procedures:  None  Microbiology summarized: COVID-19, influenza and RSV PCR nonreactive Sputum sample not acceptable for culture. Blood culture with Staph epidermidis with 2 different sensitive patterns, lugdunensis in 1 out of 2 bottles and Streptococcus species Repeat blood culture NGTD.  Assessment and plan: Community-acquired pneumonia of the right middle lobe: CXR with RML infiltrate.  Reportedly hypoxic to 80s on RA prior to arrival to ED. Still with productive cough, rhonchi and chest congestion.  Smokes about a pack a day.  COVID-19, influenza and RSV PCR nonreactive.  Blood cultures as above.  Strep pneumo and Legionella antigen negative.  Was on vancomycin, ceftriaxone and Zithromax. -Antibiotic de-escalated to IV Ancef. -Wean oxygen as able -IS, FV, mucolytics   COPD with acute exacerbation: Likely due to the above and smoking.  Improved. -Completed 5 days of steroid -Continue Dulera and Incruse Ellipta -Continue DuoNebs as needed -Antibiotics as above -Encouraged smoking cessation.  Polymicrobial bacteremia?  Contaminated blood sample? Blood culture with Staph epidermidis with different sensitivity patterns, lugdunensis in 1 out of 2 bottles and Streptococcus species.  Denies IVDU.  Repeat blood culture NGTD.  Hepatitis panel negative.  Pro-Cal negative.  TTE without vegetation.  Was on vancomycin and ceftriaxone -Antibiotic de-escalated to IV Ancef -ID following-TEE?   Polysubstance substance use disorder: Smokes about a pack a day.  Also admits to using cocaine and drinking alcohol but not quantifying.  Denies IVDU.  UDS positive for opiate, cocaine and benzodiazepine.  Seems to have some withdrawal with nasal congestion, anxiety... -Encouraged smoking cessation and refraining from cocaine and alcohol use -Ativan as needed -Continue nicotine  patch -Continue thiamine, folate, multivitamins -TOC following.   Bipolar disorder/anxiety: Seen by psychiatry on last admission and recommended discontinuation of Depakote and paroxetine due to medication noncompliance and ongoing substance use.  Appears anxious partly due to steroid. -Now off steroid. -Ativan as above -Trazodone 50 mg nightly as needed   Distal left radial fracture: after fall on 12/2.  Seen by orthopedics Dr. Alena Bills and scheduled for ORIF 12/19. -Pain control with as needed Tylenol, tramadol and oxycodone -Will notify Ortho on 12/18 if she remains in house  Nasal congestion: Likely due to withdrawal. -Continue Afrin, Flonase and Claritin  Pruritus/skin itch -P.o. Benadryl as needed.  Avoid IV  Rectal prolapse: Chronic issue. -Avoid constipation -Outpatient follow-up  Severe malnutrition: Body mass index is 18.01 kg/m. Nutrition Problem: Severe Malnutrition Etiology: social / environmental circumstances Signs/Symptoms: severe fat depletion, severe muscle depletion, percent weight loss (10% in 2 weeks) Percent weight loss: 10 % (in 2 weeks) Interventions: Refer to RD note for recommendations, Ensure Enlive (each supplement provides 350kcal and 20 grams of protein), Magic cup, MVI   DVT prophylaxis:  enoxaparin (LOVENOX) injection 40 mg Start: 08/06/23 2200  Code Status: Full code Family Communication: None at bedside Level of care: Med-Surg Status is: Inpatient Remains inpatient appropriate because: Community-acquired pneumonia, COPD exacerbation and bacteremia   Final disposition: Homeless shelter Consultants:  Infectious disease  35 minutes with more than 50% spent in reviewing records, counseling patient/family and coordinating care.   Sch Meds:  Scheduled Meds:  acetaminophen  650 mg Oral Q6H WA   carbamide peroxide  5 drop Both EARS BID   enoxaparin (LOVENOX) injection  40 mg Subcutaneous Q24H   feeding supplement  237 mL Oral TID BM    fluticasone  1 spray Each Nare Daily   folic acid  1 mg Oral Daily   guaiFENesin  600 mg Oral BID   loratadine  10 mg Oral Daily   mometasone-formoterol  2 puff Inhalation BID   multivitamin with minerals  1 tablet Oral Daily   nicotine  21 mg Transdermal Daily   oxymetazoline  1 spray Each Nare BID   pantoprazole  40 mg Oral Daily   thiamine  100 mg Oral Daily   Or   thiamine  100 mg Intravenous Daily   umeclidinium bromide  1 puff Inhalation Daily   Continuous Infusions:   ceFAZolin (ANCEF) IV 2 g (08/12/23 1408)   PRN Meds:.alum & mag hydroxide-simeth, diphenhydrAMINE, ipratropium-albuterol, LORazepam, ondansetron **OR** ondansetron (ZOFRAN) IV, oxyCODONE, senna-docusate, sodium chloride, traMADol, traZODone  Antimicrobials: Anti-infectives (From admission, onward)    Start     Dose/Rate Route Frequency Ordered Stop   08/12/23 2200  ceFAZolin (ANCEF) IVPB 2g/100 mL premix  Status:  Discontinued        2 g 200 mL/hr over 30 Minutes Intravenous Every 8 hours 08/12/23 0950 08/12/23 0951   08/12/23 1400  ceFAZolin (ANCEF) IVPB 2g/100 mL premix        2 g 200 mL/hr over 30 Minutes Intravenous Every 8 hours 08/12/23 0951     08/08/23 1800  vancomycin (VANCOCIN) IVPB 1000 mg/200 mL premix  Status:  Discontinued        1,000 mg 200 mL/hr over 60 Minutes Intravenous Every 24 hours 08/07/23 1741 08/12/23 0950   08/07/23 1800  cefTRIAXone (  ROCEPHIN) 2 g in sodium chloride 0.9 % 100 mL IVPB        2 g 200 mL/hr over 30 Minutes Intravenous Every 24 hours 08/06/23 1936 08/11/23 1913   08/07/23 1800  azithromycin (ZITHROMAX) 500 mg in sodium chloride 0.9 % 250 mL IVPB  Status:  Discontinued        500 mg 250 mL/hr over 60 Minutes Intravenous Every 24 hours 08/06/23 1936 08/07/23 1142   08/07/23 1800  azithromycin (ZITHROMAX) tablet 500 mg  Status:  Discontinued        500 mg Oral Every 24 hours 08/07/23 1142 08/09/23 0916   08/07/23 1800  vancomycin (VANCOCIN) IVPB 1000 mg/200 mL premix         1,000 mg 200 mL/hr over 60 Minutes Intravenous  Once 08/07/23 1731 08/07/23 1905   08/06/23 1800  cefTRIAXone (ROCEPHIN) 1 g in sodium chloride 0.9 % 100 mL IVPB        1 g 200 mL/hr over 30 Minutes Intravenous  Once 08/06/23 1747 08/06/23 1928   08/06/23 1800  azithromycin (ZITHROMAX) 500 mg in sodium chloride 0.9 % 250 mL IVPB        500 mg 250 mL/hr over 60 Minutes Intravenous  Once 08/06/23 1747 08/06/23 1928        I have personally reviewed the following labs and images: CBC: Recent Labs  Lab 08/06/23 1249 08/07/23 0343 08/09/23 0327 08/11/23 0327  WBC 8.2 6.4 10.6* 13.1*  HGB 14.6 12.6 13.9 15.1*  HCT 43.5 40.1 43.1 47.6*  MCV 101.6* 103.9* 102.9* 103.3*  PLT 299 273 333 333   BMP &GFR Recent Labs  Lab 08/06/23 1249 08/07/23 0343 08/09/23 0327 08/11/23 0327  NA 138 139 137 137  K 3.6 3.7 3.8 3.9  CL 101 103 100 100  CO2 28 27 29 28   GLUCOSE 100* 88 108* 119*  BUN 8 8 15 17   CREATININE 0.46 0.32* 0.42* 0.61  CALCIUM 8.5* 8.9 8.4* 8.9  MG  --   --  2.5* 2.2  PHOS  --   --  3.4  --    Estimated Creatinine Clearance: 62.3 mL/min (by C-G formula based on SCr of 0.61 mg/dL). Liver & Pancreas: Recent Labs  Lab 08/06/23 1249 08/09/23 0327 08/11/23 0327  AST 19  --  20  ALT 16  --  21  ALKPHOS 82  --  78  BILITOT 1.1  --  0.4  PROT 6.5  --  6.3*  ALBUMIN 3.8 3.4* 3.5   No results for input(s): "LIPASE", "AMYLASE" in the last 168 hours.  No results for input(s): "AMMONIA" in the last 168 hours. Diabetic: No results for input(s): "HGBA1C" in the last 72 hours. No results for input(s): "GLUCAP" in the last 168 hours. Cardiac Enzymes: No results for input(s): "CKTOTAL", "CKMB", "CKMBINDEX", "TROPONINI" in the last 168 hours. No results for input(s): "PROBNP" in the last 8760 hours. Coagulation Profile: No results for input(s): "INR", "PROTIME" in the last 168 hours. Thyroid Function Tests: No results for input(s): "TSH", "T4TOTAL", "FREET4",  "T3FREE", "THYROIDAB" in the last 72 hours. Lipid Profile: No results for input(s): "CHOL", "HDL", "LDLCALC", "TRIG", "CHOLHDL", "LDLDIRECT" in the last 72 hours. Anemia Panel: No results for input(s): "VITAMINB12", "FOLATE", "FERRITIN", "TIBC", "IRON", "RETICCTPCT" in the last 72 hours. Urine analysis:    Component Value Date/Time   COLORURINE YELLOW 08/01/2023 2112   APPEARANCEUR HAZY (A) 08/01/2023 2112   LABSPEC 1.025 08/01/2023 2112   PHURINE 5.0 08/01/2023 2112  GLUCOSEU NEGATIVE 08/01/2023 2112   HGBUR NEGATIVE 08/01/2023 2112   BILIRUBINUR NEGATIVE 08/01/2023 2112   KETONESUR NEGATIVE 08/01/2023 2112   PROTEINUR NEGATIVE 08/01/2023 2112   UROBILINOGEN 1.0 03/19/2010 1431   NITRITE POSITIVE (A) 08/01/2023 2112   LEUKOCYTESUR NEGATIVE 08/01/2023 2112   Sepsis Labs: Invalid input(s): "PROCALCITONIN", "LACTICIDVEN"  Microbiology: Recent Results (from the past 240 hours)  Blood culture (routine x 2)     Status: Abnormal   Collection Time: 08/06/23  6:05 PM   Specimen: BLOOD RIGHT FOREARM  Result Value Ref Range Status   Specimen Description   Final    BLOOD RIGHT FOREARM Performed at [facility], [address], [place], [place] [address]    Special Requests   Final    BOTTLES DRAWN AEROBIC AND ANAEROBIC Blood Culture results may not be optimal due to an inadequate volume of blood received in culture bottles Performed at [facility], [address], [place], [place] [address]    Culture  Setup Time   Final    GRAM POSITIVE COCCI ANAEROBIC BOTTLE ONLY CRITICAL RESULT CALLED TO, READ BACK BY AND VERIFIED WITH: PHARMD MELISSA JAMES ON 08/07/23 @ 1709 BY DRT Performed at [facility], [address] [place], [place] [address]    Culture (A)  Final    STAPHYLOCOCCUS EPIDERMIDIS Two isolates with different morphologies were identified as the same organism.The most resistant organism was reported. STAPHYLOCOCCUS LUGDUNENSIS    Report  Status 08/11/2023 FINAL  Final   Organism ID, Bacteria STAPHYLOCOCCUS EPIDERMIDIS  Final   Organism ID, Bacteria STAPHYLOCOCCUS LUGDUNENSIS  Final      Susceptibility   Staphylococcus epidermidis - MIC*    CIPROFLOXACIN <=0.5 SENSITIVE Sensitive     ERYTHROMYCIN <=0.25 SENSITIVE Sensitive     GENTAMICIN <=0.5 SENSITIVE Sensitive     OXACILLIN <=0.25 SENSITIVE Sensitive     TETRACYCLINE >=16 RESISTANT Resistant     VANCOMYCIN 2 SENSITIVE Sensitive     TRIMETH/SULFA <=10 SENSITIVE Sensitive     CLINDAMYCIN <=0.25 SENSITIVE Sensitive     RIFAMPIN <=0.5 SENSITIVE Sensitive     Inducible Clindamycin NEGATIVE Sensitive     * STAPHYLOCOCCUS EPIDERMIDIS   Staphylococcus lugdunensis - MIC*    CIPROFLOXACIN <=0.5 SENSITIVE Sensitive     ERYTHROMYCIN <=0.25 SENSITIVE Sensitive     GENTAMICIN <=0.5 SENSITIVE Sensitive     OXACILLIN 2 SENSITIVE Sensitive     TETRACYCLINE <=1 SENSITIVE Sensitive     VANCOMYCIN <=0.5 SENSITIVE Sensitive     TRIMETH/SULFA <=10 SENSITIVE Sensitive     CLINDAMYCIN <=0.25 SENSITIVE Sensitive     RIFAMPIN <=0.5 SENSITIVE Sensitive     Inducible Clindamycin NEGATIVE Sensitive     * STAPHYLOCOCCUS LUGDUNENSIS  Blood Culture ID Panel (Reflexed)     Status: Abnormal   Collection Time: 08/06/23  6:05 PM  Result Value Ref Range Status   Enterococcus faecalis NOT DETECTED NOT DETECTED Final   Enterococcus Faecium NOT DETECTED NOT DETECTED Final   Listeria monocytogenes NOT DETECTED NOT DETECTED Final   Staphylococcus species DETECTED (A) NOT DETECTED Final    Comment: CRITICAL RESULT CALLED TO, READ BACK BY AND VERIFIED WITH: PHARMD MELISSA JAMES ON 08/07/23 @ 1709 BY DRT    Staphylococcus aureus (BCID) NOT DETECTED NOT DETECTED Final   Staphylococcus epidermidis DETECTED (A) NOT DETECTED Final    Comment: Methicillin (oxacillin) resistant coagulase negative staphylococcus. Possible blood culture contaminant (unless isolated from more than one blood culture draw or  clinical case  suggests pathogenicity). No antibiotic treatment is indicated for blood  culture contaminants. CRITICAL RESULT CALLED TO, READ BACK BY AND VERIFIED WITH: PHARMD MELISSA JAMES ON 08/07/23 @ 1709 BY DRT    Staphylococcus lugdunensis DETECTED (A) NOT DETECTED Final    Comment: Methicillin (oxacillin) resistant coagulase negative staphylococcus. Possible blood culture contaminant (unless isolated from more than one blood culture draw or clinical case suggests pathogenicity). No antibiotic treatment is indicated for blood  culture contaminants. CRITICAL RESULT CALLED TO, READ BACK BY AND VERIFIED WITH: PHARMD MELISSA JAMES ON 08/07/23 @ 1709 BY DRT    Streptococcus species DETECTED (A) NOT DETECTED Final    Comment: Not Enterococcus species, Streptococcus agalactiae, Streptococcus pyogenes, or Streptococcus pneumoniae. CRITICAL RESULT CALLED TO, READ BACK BY AND VERIFIED WITH: PHARMD MELISSA JAMES ON 08/07/23 @ 1709 BY DRT    Streptococcus agalactiae NOT DETECTED NOT DETECTED Final   Streptococcus pneumoniae NOT DETECTED NOT DETECTED Final   Streptococcus pyogenes NOT DETECTED NOT DETECTED Final   A.calcoaceticus-baumannii NOT DETECTED NOT DETECTED Final   Bacteroides fragilis NOT DETECTED NOT DETECTED Final   Enterobacterales NOT DETECTED NOT DETECTED Final   Enterobacter cloacae complex NOT DETECTED NOT DETECTED Final   Escherichia coli NOT DETECTED NOT DETECTED Final   Klebsiella aerogenes NOT DETECTED NOT DETECTED Final   Klebsiella oxytoca NOT DETECTED NOT DETECTED Final   Klebsiella pneumoniae NOT DETECTED NOT DETECTED Final   Proteus species NOT DETECTED NOT DETECTED Final   Salmonella species NOT DETECTED NOT DETECTED Final   Serratia marcescens NOT DETECTED NOT DETECTED Final   Haemophilus influenzae NOT DETECTED NOT DETECTED Final   Neisseria meningitidis NOT DETECTED NOT DETECTED Final   Pseudomonas aeruginosa NOT DETECTED NOT DETECTED Final   Stenotrophomonas  maltophilia NOT DETECTED NOT DETECTED Final   Candida albicans NOT DETECTED NOT DETECTED Final   Candida auris NOT DETECTED NOT DETECTED Final   Candida glabrata NOT DETECTED NOT DETECTED Final   Candida krusei NOT DETECTED NOT DETECTED Final   Candida parapsilosis NOT DETECTED NOT DETECTED Final   Candida tropicalis NOT DETECTED NOT DETECTED Final   Cryptococcus neoformans/gattii NOT DETECTED NOT DETECTED Final   Methicillin resistance mecA/C DETECTED (A) NOT DETECTED Final    Comment: CRITICAL RESULT CALLED TO, READ BACK BY AND VERIFIED WITH: PHARMD MELISSA JAMES ON 08/07/23 @ 1709 BY DRT Performed at [facility], [address], [place], [place] [address]   Blood culture (routine x 2)     Status: Abnormal   Collection Time: 08/06/23  6:15 PM   Specimen: BLOOD  Result Value Ref Range Status   Specimen Description   Final    BLOOD BLOOD RIGHT WRIST Performed at [facility], [address] [place], [place] [address]    Special Requests   Final    BOTTLES DRAWN AEROBIC AND ANAEROBIC Blood Culture results may not be optimal due to an inadequate volume of blood received in culture bottles Performed at [facility], [address], [place], [place] [address]    Culture  Setup Time   Final    GRAM POSITIVE COCCI IN CLUSTERS ANAEROBIC BOTTLE ONLY CRITICAL VALUE NOTED.  VALUE IS CONSISTENT WITH PREVIOUSLY REPORTED AND CALLED VALUE. Performed at [facility], [address] [place], [place] [address]    Culture STAPHYLOCOCCUS EPIDERMIDIS (A)  Final   Report Status 08/09/2023 FINAL  Final   Organism ID, Bacteria STAPHYLOCOCCUS EPIDERMIDIS  Final      Susceptibility   Staphylococcus  epidermidis - MIC*    CIPROFLOXACIN <=0.5 SENSITIVE Sensitive     ERYTHROMYCIN <=0.25 SENSITIVE Sensitive     GENTAMICIN <=0.5 SENSITIVE Sensitive     OXACILLIN <=0.25 SENSITIVE Sensitive     TETRACYCLINE <=1 SENSITIVE Sensitive     VANCOMYCIN 1  SENSITIVE Sensitive     TRIMETH/SULFA <=10 SENSITIVE Sensitive     CLINDAMYCIN <=0.25 SENSITIVE Sensitive     RIFAMPIN <=0.5 SENSITIVE Sensitive     Inducible Clindamycin NEGATIVE Sensitive     * STAPHYLOCOCCUS EPIDERMIDIS  Resp panel by RT-PCR (RSV, Flu A&B, Covid) Anterior Nasal Swab     Status: None   Collection Time: 08/07/23 12:51 AM   Specimen: Anterior Nasal Swab  Result Value Ref Range Status   SARS Coronavirus 2 by RT PCR NEGATIVE NEGATIVE Final    Comment: (NOTE) SARS-CoV-2 target nucleic acids are NOT DETECTED.  The SARS-CoV-2 RNA is generally detectable in upper respiratory specimens during the acute phase of infection. The lowest concentration of SARS-CoV-2 viral copies this assay can detect is 138 copies/mL. A negative result does not preclude SARS-Cov-2 infection and should not be used as the sole basis for treatment or other patient management decisions. A negative result may occur with  improper specimen collection/handling, submission of specimen other than nasopharyngeal swab, presence of viral mutation(s) within the areas targeted by this assay, and inadequate number of viral copies(<138 copies/mL). A negative result must be combined with clinical observations, patient history, and epidemiological information. The expected result is Negative.  Fact Sheet for Patients:  BloggerCourse.com  Fact Sheet for Healthcare Providers:  SeriousBroker.it  This test is no t yet approved or cleared by the [place] FDA and  has been authorized for detection and/or diagnosis of SARS-CoV-2 by FDA under an Emergency Use Authorization (EUA). This EUA will remain  in effect (meaning this test can be used) for the duration of the COVID-19 declaration under Section 564(b)(1) of the Act, 21 U.S.C.section 360bbb-3(b)(1), unless the authorization is terminated  or revoked sooner.       Influenza A by PCR NEGATIVE NEGATIVE  Final   Influenza B by PCR NEGATIVE NEGATIVE Final    Comment: (NOTE) The Xpert Xpress SARS-CoV-2/FLU/RSV plus assay is intended as an aid in the diagnosis of influenza from Nasopharyngeal swab specimens and should not be used as a sole basis for treatment. Nasal washings and aspirates are unacceptable for Xpert Xpress SARS-CoV-2/FLU/RSV testing.  Fact Sheet for Patients: BloggerCourse.com  Fact Sheet for Healthcare Providers: SeriousBroker.it  This test is not yet approved or cleared by the [place] FDA and has been authorized for detection and/or diagnosis of SARS-CoV-2 by FDA under an Emergency Use Authorization (EUA). This EUA will remain in effect (meaning this test can be used) for the duration of the COVID-19 declaration under Section 564(b)(1) of the Act, 21 U.S.C. section 360bbb-3(b)(1), unless the authorization is terminated or revoked.     Resp Syncytial Virus by PCR NEGATIVE NEGATIVE Final    Comment: (NOTE) Fact Sheet for Patients: BloggerCourse.com  Fact Sheet for Healthcare Providers: SeriousBroker.it  This test is not yet approved or cleared by the [place] FDA and has been authorized for detection and/or diagnosis of SARS-CoV-2 by FDA under an Emergency Use Authorization (EUA). This EUA will remain in effect (meaning this test can be used) for the duration of the COVID-19 declaration under Section 564(b)(1) of the Act, 21 U.S.C. section 360bbb-3(b)(1), unless the authorization is terminated or revoked.  Performed at [facility], [address], [place], [place] [address]   Expectorated Sputum Assessment w Gram Stain, Rflx to Resp Cult     Status: None   Collection Time: 08/07/23  4:39 AM   Specimen: Expectorated Sputum  Result Value Ref Range Status   Specimen Description EXPECTORATED SPUTUM  Final   Special Requests NONE   Final   Sputum evaluation   Final    Sputum specimen not acceptable for testing.  Please recollect.   NOTIFIED Janace Hoard RN AT [phone] ON 08/07/2023 BY MECIAL J. Performed at [facility], [address], [place], [place] [address]    Report Status 08/07/2023 FINAL  Final  Culture, blood (Routine X 2) w Reflex to ID Panel     Status: None (Preliminary result)   Collection Time: 08/08/23  9:29 AM   Specimen: BLOOD  Result Value Ref Range Status   Specimen Description   Final    BLOOD SITE NOT SPECIFIED Performed at [facility], [address], [place], [place] [address]    Special Requests   Final    BOTTLES DRAWN AEROBIC AND ANAEROBIC Blood Culture results may not be optimal due to an inadequate volume of blood received in culture bottles Performed at [facility], [address] [place], [place] [address]    Culture   Final    NO GROWTH 4 DAYS Performed at [facility], [address] [place], [place] [address]    Report Status PENDING  Incomplete  Culture, blood (Routine X 2) w Reflex to ID Panel     Status: None (Preliminary result)   Collection Time: 08/08/23  9:41 AM   Specimen: BLOOD  Result Value Ref Range Status   Specimen Description   Final    BLOOD SITE NOT SPECIFIED Performed at [facility], [address] [place], [place] [address]    Special Requests   Final    BOTTLES DRAWN AEROBIC AND ANAEROBIC Blood Culture results may not be optimal due to an inadequate volume of blood received in culture bottles Performed at [facility], [address], [place], [place] [address]    Culture   Final    NO GROWTH 4 DAYS Performed at [facility], [address], [place], [place] [address]    Report Status PENDING  Incomplete    Radiology Studies: No results found.    Tamarra Geiselman T. Aline Wesche [facility]  If 7PM-7AM, please contact  night-coverage [email or web address] 08/12/2023, 5:09 PM

## 2023-08-12 NOTE — Plan of Care (Signed)
  Problem: Health Behavior/Discharge Planning: Goal: Ability to manage health-related needs will improve Outcome: Progressing   Problem: Clinical Measurements: Goal: Ability to maintain clinical measurements within normal limits will improve Outcome: Progressing Goal: Will remain free from infection Outcome: Progressing Goal: Diagnostic test results will improve Outcome: Progressing Goal: Respiratory complications will improve Outcome: Progressing Goal: Cardiovascular complication will be avoided Outcome: Progressing   Problem: Activity: Goal: Risk for activity intolerance will decrease Outcome: Adequate for Discharge   Problem: Coping: Goal: Level of anxiety will decrease Outcome: Progressing   Problem: Pain Management: Goal: General experience of comfort will improve Outcome: Progressing   Problem: Safety: Goal: Ability to remain free from injury will improve Outcome: Progressing   Problem: Skin Integrity: Goal: Risk for impaired skin integrity will decrease Outcome: Adequate for Discharge   Problem: Activity: Goal: Ability to tolerate increased activity will improve Outcome: Adequate for Discharge   Problem: Clinical Measurements: Goal: Ability to maintain a body temperature in the normal range will improve Outcome: Completed/Met   Problem: Respiratory: Goal: Ability to maintain adequate ventilation will improve Outcome: Progressing Goal: Ability to maintain a clear airway will improve Outcome: Progressing   Problem: Activity: Goal: Ability to tolerate increased activity will improve Outcome: Progressing   Problem: Clinical Measurements: Goal: Ability to maintain a body temperature in the normal range will improve Outcome: Progressing
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
## 2023-08-13 DIAGNOSIS — N8110 Cystocele, unspecified: Secondary | ICD-10-CM

## 2023-08-13 DIAGNOSIS — Z5900 Homelessness unspecified: Secondary | ICD-10-CM

## 2023-08-13 LAB — CULTURE, BLOOD (ROUTINE X 2)
Culture: NO GROWTH
Culture: NO GROWTH

## 2023-08-13 MED ORDER — HYDROCORTISONE (PERIANAL) 2.5 % EX CREA
TOPICAL_CREAM | Freq: Two times a day (BID) | CUTANEOUS | Status: DC
  Administered 2023-08-13: 1 via RECTAL
  Filled 2023-08-13: qty 28.35
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
## 2023-08-13 NOTE — Progress Notes (Signed)
PROGRESS NOTE  Stacy Bolton HEN:[id number] DOB: [birth year]   PCP: Patient, No Pcp Per  Patient is from: [facility] shelter   DOA: 08/06/2023 LOS: 7  Chief complaints Chief Complaint  Patient presents with   Shortness of Breath     Brief Narrative / Interim history: 52 year old F with PMH of COPD, anxiety, bipolar disorder, polysubstance use including EtOH, cocaine, THC and tobacco, homelessness and recent left arm fracture presenting with shortness of breath, productive cough with yellowish phlegm, fever, chills and diaphoresis and admitted with community-acquired pneumonia and COPD exacerbation.  Checks x-ray showed RML infiltrate.  Patient smokes about a pack a day.  She was started on ceftriaxone, Zithromax, steroid and breathing treatments and admitted.  Blood culture with Staph epidermidis with different sensitivity patterns, Staph lugdunensis in 1 out of 2 bottles and Streptococcus species. Vancomycin added.  Repeat blood culture NGTD.  TTE without significant finding.  Antibiotics de-escalated to Ancef.  ID following.  Subjective: Seen and examined earlier this morning.  No major events overnight of this morning.  No new complaints other than usual nasal congestion and some chest tightness.  She was told by her orthopedic office that her left arm repair has to be delayed due to current infection.  She is unhappy about this.  Objective: Vitals:   08/12/23 1520 08/12/23 2158 08/13/23 0521 08/13/23 0723  BP: 112/86 112/72 103/61   Pulse: 98 99 95   Resp:  17 17   Temp:  98.2 F (36.8 C) 97.9 F (36.6 C)   TempSrc:  Oral Oral   SpO2:  93% 93% 93%  Weight:      Height:        Examination:  GENERAL: No apparent distress.  Nontoxic. HEENT: MMM.  Vision and hearing grossly intact.  Nasal congestion. NECK: Supple.  No apparent JVD.  RESP:  No IWOB.  Fair aeration bilaterally.  Some rhonchi bilaterally. CVS:  RRR. Heart sounds normal.  ABD/GI/GU:  BS+. Abd soft, NTND.  MSK/EXT:  Moves extremities.  Left forearm in splint. SKIN: no apparent skin lesion or wound NEURO: Awake, alert and oriented appropriately.  No apparent focal neuro deficit. PSYCH: Appears anxious.  Procedures:  None  Microbiology summarized: COVID-19, influenza and RSV PCR nonreactive Sputum sample not acceptable for culture. Blood culture with Staph epidermidis with 2 different sensitive patterns, lugdunensis in 1 out of 2 bottles and Streptococcus species Repeat blood culture NGTD.  Assessment and plan: Polymicrobial bacteremia?  Contaminated blood sample? Blood culture with Staph epidermidis with different sensitivity patterns, lugdunensis in 1 out of 2 bottles and Streptococcus species.  Denies IVDU.  Repeat blood culture NGTD.  Hepatitis panel negative.  Pro-Cal negative.  TTE without vegetation.  Was on vancomycin and ceftriaxone -Antibiotic de-escalated to IV Ancef -ID following-TEE?  Community-acquired pneumonia of the right middle lobe: CXR with RML infiltrate.  Reportedly hypoxic to 80s on RA prior to arrival to ED. Still with productive cough, rhonchi and chest congestion.  Smokes about a pack a day.  COVID-19, influenza and RSV PCR nonreactive.  Blood cultures as above.  Strep pneumo and Legionella antigen negative.  Was on vancomycin, ceftriaxone and Zithromax. -Antibiotic de-escalated to IV Ancef. -Wean oxygen as able -IS, FV, mucolytics   COPD with acute exacerbation: Likely due to the above and smoking.  Improved. -Completed 5 days of steroid -Continue Dulera and Incruse Ellipta -Continue DuoNebs as needed -Antibiotics as above -Encouraged smoking cessation.  Polysubstance substance use disorder: Smokes about a  pack a day.  Also admits to using cocaine and drinking alcohol but not quantifying.  Denies IVDU.  UDS positive for opiate, cocaine and benzodiazepine.  Seems to have some withdrawal with nasal congestion, anxiety... -Encouraged smoking  cessation and refraining from cocaine and alcohol use -Ativan as needed -Continue nicotine patch -Continue thiamine, folate, multivitamins -TOC following.   Bipolar disorder/anxiety: Seen by psychiatry on last admission and recommended discontinuation of Depakote and paroxetine due to medication noncompliance and ongoing substance use.  Appears anxious partly due to steroid. -Now off steroid. -Ativan as above -Trazodone 50 mg nightly as needed   Distal left radial fracture: after fall on 12/2.  Seen by orthopedics Dr. Alena Bills and scheduled for ORIF 12/19. -Pain control with as needed Tylenol, tramadol and oxycodone -Per patient, surgery being delayed due to current infection.  Nasal congestion: Likely due to withdrawal. -Continue Afrin, Flonase and Claritin  Pruritus/skin itch -P.o. Benadryl as needed.  Avoid IV  Rectal prolapse: Chronic issue. -Avoid constipation -Anusol cream -Outpatient follow-up  Severe malnutrition: Body mass index is 18.01 kg/m. Nutrition Problem: Severe Malnutrition Etiology: social / environmental circumstances Signs/Symptoms: severe fat depletion, severe muscle depletion, percent weight loss (10% in 2 weeks) Percent weight loss: 10 % (in 2 weeks) Interventions: Refer to RD note for recommendations, Ensure Enlive (each supplement provides 350kcal and 20 grams of protein), Magic cup, MVI   DVT prophylaxis:  enoxaparin (LOVENOX) injection 40 mg Start: 08/06/23 2200  Code Status: Full code Family Communication: None at bedside Level of care: Med-Surg Status is: Inpatient Remains inpatient appropriate because: Community-acquired pneumonia, COPD exacerbation and bacteremia   Final disposition: Homeless shelter Consultants:  Infectious disease  35 minutes with more than 50% spent in reviewing records, counseling patient/family and coordinating care.   Sch Meds:  Scheduled Meds:  acetaminophen  650 mg Oral Q6H WA   carbamide peroxide  5 drop  Both EARS BID   enoxaparin (LOVENOX) injection  40 mg Subcutaneous Q24H   feeding supplement  237 mL Oral TID BM   fluticasone  1 spray Each Nare Daily   folic acid  1 mg Oral Daily   guaiFENesin  600 mg Oral BID   loratadine  10 mg Oral Daily   mometasone-formoterol  2 puff Inhalation BID   multivitamin with minerals  1 tablet Oral Daily   nicotine  21 mg Transdermal Daily   pantoprazole  40 mg Oral Daily   thiamine  100 mg Oral Daily   Or   thiamine  100 mg Intravenous Daily   umeclidinium bromide  1 puff Inhalation Daily   Continuous Infusions:   ceFAZolin (ANCEF) IV Stopped (08/13/23 0700)   PRN Meds:.alum & mag hydroxide-simeth, diphenhydrAMINE, ipratropium-albuterol, LORazepam, ondansetron **OR** ondansetron (ZOFRAN) IV, oxyCODONE, senna-docusate, sodium chloride, traMADol, traZODone  Antimicrobials: Anti-infectives (From admission, onward)    Start     Dose/Rate Route Frequency Ordered Stop   08/12/23 2200  ceFAZolin (ANCEF) IVPB 2g/100 mL premix  Status:  Discontinued        2 g 200 mL/hr over 30 Minutes Intravenous Every 8 hours 08/12/23 0950 08/12/23 0951   08/12/23 1400  ceFAZolin (ANCEF) IVPB 2g/100 mL premix        2 g 200 mL/hr over 30 Minutes Intravenous Every 8 hours 08/12/23 0951     08/08/23 1800  vancomycin (VANCOCIN) IVPB 1000 mg/200 mL premix  Status:  Discontinued        1,000 mg 200 mL/hr over 60 Minutes Intravenous Every 24 hours  08/07/23 1741 08/12/23 0950   08/07/23 1800  cefTRIAXone (ROCEPHIN) 2 g in sodium chloride 0.9 % 100 mL IVPB        2 g 200 mL/hr over 30 Minutes Intravenous Every 24 hours 08/06/23 1936 08/11/23 1913   08/07/23 1800  azithromycin (ZITHROMAX) 500 mg in sodium chloride 0.9 % 250 mL IVPB  Status:  Discontinued        500 mg 250 mL/hr over 60 Minutes Intravenous Every 24 hours 08/06/23 1936 08/07/23 1142   08/07/23 1800  azithromycin (ZITHROMAX) tablet 500 mg  Status:  Discontinued        500 mg Oral Every 24 hours 08/07/23 1142  08/09/23 0916   08/07/23 1800  vancomycin (VANCOCIN) IVPB 1000 mg/200 mL premix        1,000 mg 200 mL/hr over 60 Minutes Intravenous  Once 08/07/23 1731 08/07/23 1905   08/06/23 1800  cefTRIAXone (ROCEPHIN) 1 g in sodium chloride 0.9 % 100 mL IVPB        1 g 200 mL/hr over 30 Minutes Intravenous  Once 08/06/23 1747 08/06/23 1928   08/06/23 1800  azithromycin (ZITHROMAX) 500 mg in sodium chloride 0.9 % 250 mL IVPB        500 mg 250 mL/hr over 60 Minutes Intravenous  Once 08/06/23 1747 08/06/23 1928        I have personally reviewed the following labs and images: CBC: Recent Labs  Lab 08/06/23 1249 08/07/23 0343 08/09/23 0327 08/11/23 0327  WBC 8.2 6.4 10.6* 13.1*  HGB 14.6 12.6 13.9 15.1*  HCT 43.5 40.1 43.1 47.6*  MCV 101.6* 103.9* 102.9* 103.3*  PLT 299 273 333 333   BMP &GFR Recent Labs  Lab 08/06/23 1249 08/07/23 0343 08/09/23 0327 08/11/23 0327  NA 138 139 137 137  K 3.6 3.7 3.8 3.9  CL 101 103 100 100  CO2 28 27 29 28   GLUCOSE 100* 88 108* 119*  BUN 8 8 15 17   CREATININE 0.46 0.32* 0.42* 0.61  CALCIUM 8.5* 8.9 8.4* 8.9  MG  --   --  2.5* 2.2  PHOS  --   --  3.4  --    Estimated Creatinine Clearance: 62.3 mL/min (by C-G formula based on SCr of 0.61 mg/dL). Liver & Pancreas: Recent Labs  Lab 08/06/23 1249 08/09/23 0327 08/11/23 0327  AST 19  --  20  ALT 16  --  21  ALKPHOS 82  --  78  BILITOT 1.1  --  0.4  PROT 6.5  --  6.3*  ALBUMIN 3.8 3.4* 3.5   No results for input(s): "LIPASE", "AMYLASE" in the last 168 hours.  No results for input(s): "AMMONIA" in the last 168 hours. Diabetic: No results for input(s): "HGBA1C" in the last 72 hours. No results for input(s): "GLUCAP" in the last 168 hours. Cardiac Enzymes: No results for input(s): "CKTOTAL", "CKMB", "CKMBINDEX", "TROPONINI" in the last 168 hours. No results for input(s): "PROBNP" in the last 8760 hours. Coagulation Profile: No results for input(s): "INR", "PROTIME" in the last 168  hours. Thyroid Function Tests: No results for input(s): "TSH", "T4TOTAL", "FREET4", "T3FREE", "THYROIDAB" in the last 72 hours. Lipid Profile: No results for input(s): "CHOL", "HDL", "LDLCALC", "TRIG", "CHOLHDL", "LDLDIRECT" in the last 72 hours. Anemia Panel: No results for input(s): "VITAMINB12", "FOLATE", "FERRITIN", "TIBC", "IRON", "RETICCTPCT" in the last 72 hours. Urine analysis:    Component Value Date/Time   COLORURINE YELLOW 08/01/2023 2112   APPEARANCEUR HAZY (A) 08/01/2023 2112   LABSPEC  1.025 08/01/2023 2112   PHURINE 5.0 08/01/2023 2112   GLUCOSEU NEGATIVE 08/01/2023 2112   HGBUR NEGATIVE 08/01/2023 2112   BILIRUBINUR NEGATIVE 08/01/2023 2112   KETONESUR NEGATIVE 08/01/2023 2112   PROTEINUR NEGATIVE 08/01/2023 2112   UROBILINOGEN 1.0 03/19/2010 1431   NITRITE POSITIVE (A) 08/01/2023 2112   LEUKOCYTESUR NEGATIVE 08/01/2023 2112   Sepsis Labs: Invalid input(s): "PROCALCITONIN", "LACTICIDVEN"  Microbiology: Recent Results (from the past 240 hours)  Blood culture (routine x 2)     Status: Abnormal   Collection Time: 08/06/23  6:05 PM   Specimen: BLOOD RIGHT FOREARM  Result Value Ref Range Status   Specimen Description   Final    BLOOD RIGHT FOREARM Performed at [facility], [address] [place], [place] [address]    Special Requests   Final    BOTTLES DRAWN AEROBIC AND ANAEROBIC Blood Culture results may not be optimal due to an inadequate volume of blood received in culture bottles Performed at [facility], [address] [place], [place] [address]    Culture  Setup Time   Final    GRAM POSITIVE COCCI ANAEROBIC BOTTLE ONLY CRITICAL RESULT CALLED TO, READ BACK BY AND VERIFIED WITH: PHARMD MELISSA JAMES ON 08/07/23 @ 1709 BY DRT Performed at [facility], [address] [place], [place] [address]    Culture (A)  Final    STAPHYLOCOCCUS EPIDERMIDIS Two isolates with different morphologies were identified as the same  organism.The most resistant organism was reported. STAPHYLOCOCCUS LUGDUNENSIS    Report Status 08/11/2023 FINAL  Final   Organism ID, Bacteria STAPHYLOCOCCUS EPIDERMIDIS  Final   Organism ID, Bacteria STAPHYLOCOCCUS LUGDUNENSIS  Final      Susceptibility   Staphylococcus epidermidis - MIC*    CIPROFLOXACIN <=0.5 SENSITIVE Sensitive     ERYTHROMYCIN <=0.25 SENSITIVE Sensitive     GENTAMICIN <=0.5 SENSITIVE Sensitive     OXACILLIN <=0.25 SENSITIVE Sensitive     TETRACYCLINE >=16 RESISTANT Resistant     VANCOMYCIN 2 SENSITIVE Sensitive     TRIMETH/SULFA <=10 SENSITIVE Sensitive     CLINDAMYCIN <=0.25 SENSITIVE Sensitive     RIFAMPIN <=0.5 SENSITIVE Sensitive     Inducible Clindamycin NEGATIVE Sensitive     * STAPHYLOCOCCUS EPIDERMIDIS   Staphylococcus lugdunensis - MIC*    CIPROFLOXACIN <=0.5 SENSITIVE Sensitive     ERYTHROMYCIN <=0.25 SENSITIVE Sensitive     GENTAMICIN <=0.5 SENSITIVE Sensitive     OXACILLIN 2 SENSITIVE Sensitive     TETRACYCLINE <=1 SENSITIVE Sensitive     VANCOMYCIN <=0.5 SENSITIVE Sensitive     TRIMETH/SULFA <=10 SENSITIVE Sensitive     CLINDAMYCIN <=0.25 SENSITIVE Sensitive     RIFAMPIN <=0.5 SENSITIVE Sensitive     Inducible Clindamycin NEGATIVE Sensitive     * STAPHYLOCOCCUS LUGDUNENSIS  Blood Culture ID Panel (Reflexed)     Status: Abnormal   Collection Time: 08/06/23  6:05 PM  Result Value Ref Range Status   Enterococcus faecalis NOT DETECTED NOT DETECTED Final   Enterococcus Faecium NOT DETECTED NOT DETECTED Final   Listeria monocytogenes NOT DETECTED NOT DETECTED Final   Staphylococcus species DETECTED (A) NOT DETECTED Final    Comment: CRITICAL RESULT CALLED TO, READ BACK BY AND VERIFIED WITH: PHARMD MELISSA JAMES ON 08/07/23 @ 1709 BY DRT    Staphylococcus aureus (BCID) NOT DETECTED NOT DETECTED Final   Staphylococcus epidermidis DETECTED (A) NOT DETECTED Final    Comment: Methicillin (oxacillin) resistant coagulase negative staphylococcus.  Possible blood culture contaminant (unless  isolated from more than one blood culture draw or clinical case suggests pathogenicity). No antibiotic treatment is indicated for blood  culture contaminants. CRITICAL RESULT CALLED TO, READ BACK BY AND VERIFIED WITH: PHARMD MELISSA JAMES ON 08/07/23 @ 1709 BY DRT    Staphylococcus lugdunensis DETECTED (A) NOT DETECTED Final    Comment: Methicillin (oxacillin) resistant coagulase negative staphylococcus. Possible blood culture contaminant (unless isolated from more than one blood culture draw or clinical case suggests pathogenicity). No antibiotic treatment is indicated for blood  culture contaminants. CRITICAL RESULT CALLED TO, READ BACK BY AND VERIFIED WITH: PHARMD MELISSA JAMES ON 08/07/23 @ 1709 BY DRT    Streptococcus species DETECTED (A) NOT DETECTED Final    Comment: Not Enterococcus species, Streptococcus agalactiae, Streptococcus pyogenes, or Streptococcus pneumoniae. CRITICAL RESULT CALLED TO, READ BACK BY AND VERIFIED WITH: PHARMD MELISSA JAMES ON 08/07/23 @ 1709 BY DRT    Streptococcus agalactiae NOT DETECTED NOT DETECTED Final   Streptococcus pneumoniae NOT DETECTED NOT DETECTED Final   Streptococcus pyogenes NOT DETECTED NOT DETECTED Final   A.calcoaceticus-baumannii NOT DETECTED NOT DETECTED Final   Bacteroides fragilis NOT DETECTED NOT DETECTED Final   Enterobacterales NOT DETECTED NOT DETECTED Final   Enterobacter cloacae complex NOT DETECTED NOT DETECTED Final   Escherichia coli NOT DETECTED NOT DETECTED Final   Klebsiella aerogenes NOT DETECTED NOT DETECTED Final   Klebsiella oxytoca NOT DETECTED NOT DETECTED Final   Klebsiella pneumoniae NOT DETECTED NOT DETECTED Final   Proteus species NOT DETECTED NOT DETECTED Final   Salmonella species NOT DETECTED NOT DETECTED Final   Serratia marcescens NOT DETECTED NOT DETECTED Final   Haemophilus influenzae NOT DETECTED NOT DETECTED Final   Neisseria meningitidis NOT DETECTED NOT  DETECTED Final   Pseudomonas aeruginosa NOT DETECTED NOT DETECTED Final   Stenotrophomonas maltophilia NOT DETECTED NOT DETECTED Final   Candida albicans NOT DETECTED NOT DETECTED Final   Candida auris NOT DETECTED NOT DETECTED Final   Candida glabrata NOT DETECTED NOT DETECTED Final   Candida krusei NOT DETECTED NOT DETECTED Final   Candida parapsilosis NOT DETECTED NOT DETECTED Final   Candida tropicalis NOT DETECTED NOT DETECTED Final   Cryptococcus neoformans/gattii NOT DETECTED NOT DETECTED Final   Methicillin resistance mecA/C DETECTED (A) NOT DETECTED Final    Comment: CRITICAL RESULT CALLED TO, READ BACK BY AND VERIFIED WITH: PHARMD MELISSA JAMES ON 08/07/23 @ 1709 BY DRT Performed at [facility], [address] [place], [place] [address]   Blood culture (routine x 2)     Status: Abnormal   Collection Time: 08/06/23  6:15 PM   Specimen: BLOOD  Result Value Ref Range Status   Specimen Description   Final    BLOOD BLOOD RIGHT WRIST Performed at [facility], [address] [place], [place] [address]    Special Requests   Final    BOTTLES DRAWN AEROBIC AND ANAEROBIC Blood Culture results may not be optimal due to an inadequate volume of blood received in culture bottles Performed at [facility], [address] [place], [place] [address]    Culture  Setup Time   Final    GRAM POSITIVE COCCI IN CLUSTERS ANAEROBIC BOTTLE ONLY CRITICAL VALUE NOTED.  VALUE IS CONSISTENT WITH PREVIOUSLY REPORTED AND CALLED VALUE. Performed at [facility], [address] [place], [place] [address]    Culture STAPHYLOCOCCUS EPIDERMIDIS (A)  Final   Report Status 08/09/2023 FINAL  Final   Organism ID, Bacteria STAPHYLOCOCCUS EPIDERMIDIS  Final      Susceptibility   Staphylococcus epidermidis - MIC*    CIPROFLOXACIN <=0.5 SENSITIVE Sensitive     ERYTHROMYCIN <=0.25 SENSITIVE Sensitive     GENTAMICIN <=0.5 SENSITIVE Sensitive     OXACILLIN  <=0.25 SENSITIVE Sensitive     TETRACYCLINE <=1 SENSITIVE Sensitive     VANCOMYCIN 1 SENSITIVE Sensitive     TRIMETH/SULFA <=10 SENSITIVE Sensitive     CLINDAMYCIN <=0.25 SENSITIVE Sensitive     RIFAMPIN <=0.5 SENSITIVE Sensitive     Inducible Clindamycin NEGATIVE Sensitive     * STAPHYLOCOCCUS EPIDERMIDIS  Resp panel by RT-PCR (RSV, Flu A&B, Covid) Anterior Nasal Swab     Status: None   Collection Time: 08/07/23 12:51 AM   Specimen: Anterior Nasal Swab  Result Value Ref Range Status   SARS Coronavirus 2 by RT PCR NEGATIVE NEGATIVE Final    Comment: (NOTE) SARS-CoV-2 target nucleic acids are NOT DETECTED.  The SARS-CoV-2 RNA is generally detectable in upper respiratory specimens during the acute phase of infection. The lowest concentration of SARS-CoV-2 viral copies this assay can detect is 138 copies/mL. A negative result does not preclude SARS-Cov-2 infection and should not be used as the sole basis for treatment or other patient management decisions. A negative result may occur with  improper specimen collection/handling, submission of specimen other than nasopharyngeal swab, presence of viral mutation(s) within the areas targeted by this assay, and inadequate number of viral copies(<138 copies/mL). A negative result must be combined with clinical observations, patient history, and epidemiological information. The expected result is Negative.  Fact Sheet for Patients:  BloggerCourse.com  Fact Sheet for Healthcare Providers:  SeriousBroker.it  This test is no t yet approved or cleared by the [place] FDA and  has been authorized for detection and/or diagnosis of SARS-CoV-2 by FDA under an Emergency Use Authorization (EUA). This EUA will remain  in effect (meaning this test can be used) for the duration of the COVID-19 declaration under Section 564(b)(1) of the Act, 21 U.S.C.section 360bbb-3(b)(1), unless the authorization  is terminated  or revoked sooner.       Influenza A by PCR NEGATIVE NEGATIVE Final   Influenza B by PCR NEGATIVE NEGATIVE Final    Comment: (NOTE) The Xpert Xpress SARS-CoV-2/FLU/RSV plus assay is intended as an aid in the diagnosis of influenza from Nasopharyngeal swab specimens and should not be used as a sole basis for treatment. Nasal washings and aspirates are unacceptable for Xpert Xpress SARS-CoV-2/FLU/RSV testing.  Fact Sheet for Patients: BloggerCourse.com  Fact Sheet for Healthcare Providers: SeriousBroker.it  This test is not yet approved or cleared by the [place] FDA and has been authorized for detection and/or diagnosis of SARS-CoV-2 by FDA under an Emergency Use Authorization (EUA). This EUA will remain in effect (meaning this test can be used) for the duration of the COVID-19 declaration under Section 564(b)(1) of the Act, 21 U.S.C. section 360bbb-3(b)(1), unless the authorization is terminated or revoked.     Resp Syncytial Virus by PCR NEGATIVE NEGATIVE Final    Comment: (NOTE) Fact Sheet for Patients: BloggerCourse.com  Fact Sheet for Healthcare Providers: SeriousBroker.it  This test is not yet approved or cleared by the [place] FDA and has been authorized for detection and/or diagnosis of SARS-CoV-2 by FDA under an Emergency Use Authorization (EUA). This EUA will remain in effect (meaning this test can be used) for the duration of the COVID-19 declaration under Section 564(b)(1) of the Act, 21 U.S.C. section 360bbb-3(b)(1), unless the authorization  is terminated or revoked.  Performed at [facility], [address], [place], [place] [address]   Expectorated Sputum Assessment w Gram Stain, Rflx to Resp Cult     Status: None   Collection Time: 08/07/23  4:39 AM   Specimen: Expectorated Sputum  Result Value Ref Range  Status   Specimen Description EXPECTORATED SPUTUM  Final   Special Requests NONE  Final   Sputum evaluation   Final    Sputum specimen not acceptable for testing.  Please recollect.   NOTIFIED Janace Hoard RN AT [phone] ON 08/07/2023 BY MECIAL J. Performed at [facility], [address], [place], [place] [address]    Report Status 08/07/2023 FINAL  Final  Culture, blood (Routine X 2) w Reflex to ID Panel     Status: None   Collection Time: 08/08/23  9:29 AM   Specimen: BLOOD  Result Value Ref Range Status   Specimen Description   Final    BLOOD SITE NOT SPECIFIED Performed at [facility], [address] [place], [place] [address]    Special Requests   Final    BOTTLES DRAWN AEROBIC AND ANAEROBIC Blood Culture results may not be optimal due to an inadequate volume of blood received in culture bottles Performed at [facility], [address] [place], [place] [address]    Culture   Final    NO GROWTH 5 DAYS Performed at [facility], [address], [place], [place] [address]    Report Status 08/13/2023 FINAL  Final  Culture, blood (Routine X 2) w Reflex to ID Panel     Status: None   Collection Time: 08/08/23  9:41 AM   Specimen: BLOOD  Result Value Ref Range Status   Specimen Description   Final    BLOOD SITE NOT SPECIFIED Performed at [facility], [address] [place], [place] [address]    Special Requests   Final    BOTTLES DRAWN AEROBIC AND ANAEROBIC Blood Culture results may not be optimal due to an inadequate volume of blood received in culture bottles Performed at [facility], [address], [place], [place] [address]    Culture   Final    NO GROWTH 5 DAYS Performed at [facility], [address] [place], [place] [address]    Report Status 08/13/2023 FINAL  Final    Radiology Studies: No results found.    Dareth Andrew T. Tramon Crescenzo [facility]  If  7PM-7AM, please contact night-coverage [email or web address] 08/13/2023, 12:31 PM

## 2023-08-14 DIAGNOSIS — Z5900 Homelessness unspecified: Secondary | ICD-10-CM

## 2023-08-14 DIAGNOSIS — N8110 Cystocele, unspecified: Secondary | ICD-10-CM

## 2023-08-14 LAB — CBC
HCT: 43.3 % (ref 36.0–46.0)
Hemoglobin: 13.9 g/dL (ref 12.0–15.0)
MCH: 33 pg (ref 26.0–34.0)
MCHC: 32.1 g/dL (ref 30.0–36.0)
MCV: 102.9 fL — ABNORMAL HIGH (ref 80.0–100.0)
Platelets: 317 10*3/uL (ref 150–400)
RBC: 4.21 MIL/uL (ref 3.87–5.11)
RDW: 13.9 % (ref 11.5–15.5)
WBC: 13.1 10*3/uL — ABNORMAL HIGH (ref 4.0–10.5)
nRBC: 0 % (ref 0.0–0.2)

## 2023-08-14 LAB — RENAL FUNCTION PANEL
Albumin: 3.4 g/dL — ABNORMAL LOW (ref 3.5–5.0)
Anion gap: 10 (ref 5–15)
BUN: 26 mg/dL — ABNORMAL HIGH (ref 6–20)
CO2: 27 mmol/L (ref 22–32)
Calcium: 8.9 mg/dL (ref 8.9–10.3)
Chloride: 100 mmol/L (ref 98–111)
Creatinine, Ser: 0.45 mg/dL (ref 0.44–1.00)
GFR, Estimated: >60 mL/min (ref >60)
Glucose, Bld: 78 mg/dL (ref 70–99)
Phosphorus: 4.3 mg/dL (ref 2.5–4.6)
Potassium: 4.1 mmol/L (ref 3.5–5.1)
Sodium: 137 mmol/L (ref 135–145)

## 2023-08-14 LAB — MAGNESIUM: Magnesium: 2.3 mg/dL (ref 1.7–2.4)

## 2023-08-14 MED ORDER — ADULT MULTIVITAMIN W/MINERALS CH: 1.0000 | ORAL_TABLET | Freq: Every day | ORAL | Status: DC

## 2023-08-14 MED ORDER — FOLIC ACID 1 MG PO TABS: 1.0000 mg | ORAL_TABLET | Freq: Every day | ORAL | Status: DC

## 2023-08-14 MED ORDER — THIAMINE MONONITRATE 100 MG PO TABS: 100.0000 mg | ORAL_TABLET | Freq: Every day | ORAL | Status: DC

## 2023-08-14 MED ORDER — LORAZEPAM 1 MG PO TABS
1.0000 mg | ORAL_TABLET | ORAL | Status: AC | PRN
Start: 2023-08-14 — End: 2023-08-17
  Administered 2023-08-14: 2 mg via ORAL
  Administered 2023-08-15 – 2023-08-16 (×3): 1 mg via ORAL
  Administered 2023-08-16: 2 mg via ORAL
  Filled 2023-08-14 (×2): qty 1
  Filled 2023-08-14: qty 2
  Filled 2023-08-14 (×2): qty 1
  Filled 2023-08-14: qty 2

## 2023-08-14 MED ORDER — THIAMINE HCL 100 MG/ML IJ SOLN: 100.0000 mg | Freq: Every day | INTRAMUSCULAR | Status: DC

## 2023-08-14 MED ORDER — LORAZEPAM 2 MG/ML IJ SOLN: 1.0000 mg | INTRAMUSCULAR | Status: AC | PRN

## 2023-08-14 NOTE — Progress Notes (Signed)
MEWS Progress Note  Patient Details Name: Stacy Bolton MRN: [id number] DOB: 1971/03/20 Today's Date: 08/14/2023   MEWS Flowsheet Documentation:  Assess: MEWS Score Temp: 98.3 F (36.8 C) BP: 112/82 MAP (mmHg): 91 Pulse Rate: (!) 120 ECG Heart Rate: (!) 102 Resp: (!) 22 Level of Consciousness: Alert SpO2: 97 % O2 Device: Room Air Assess: MEWS Score MEWS Temp: 0 MEWS Systolic: 0 MEWS Pulse: 2 MEWS RR: 1 MEWS LOC: 0 MEWS Score: 3 MEWS Score Color: Yellow Assess: SIRS CRITERIA SIRS Temperature : 0 SIRS Respirations : 1 SIRS Pulse: 1 SIRS WBC: 0 SIRS Score Sum : 2 Assess: if the MEWS score is Yellow or Red Were vital signs accurate and taken at a resting state?: Yes Does the patient meet 2 or more of the SIRS criteria?: Yes Does the patient have a confirmed or suspected source of infection?: Yes MEWS guidelines implemented : Yes, yellow Treat MEWS Interventions: Considered administering scheduled or prn medications/treatments as ordered Take Vital Signs Increase Vital Sign Frequency : Yellow: Q2hr x1, continue Q4hrs until patient remains green for 12hrs Escalate MEWS: Escalate: Yellow: Discuss with charge nurse and consider notifying provider and/or RRT Notify: Charge Nurse/RN Name of Charge Nurse/RN Notified: Brayton Caves, RN Provider Notification Provider Name/Title: Nolberto Hanlon, MD Date Provider Notified: 08/14/23 Time Provider Notified: 1030 Method of Notification: Page Notification Reason: Other (Comment) (yellow MEWS, CIWA protocol) Provider response: Evaluate remotely, See new orders Date of Provider Response: 08/14/23 Time of Provider Response: 1035      [name] 08/14/2023, 10:38 AM

## 2023-08-14 NOTE — Progress Notes (Signed)
Progress Note   Patient: Stacy Bolton XBJ:[id number] DOB: 09-22-1970 DOA: 08/06/2023     8 DOS: the patient was seen and examined on 08/14/2023   Brief hospital course: Stacy Bolton is a 52 y.o. female with medical history significant for COPD, bipolar disorder, anxiety, substance use disorder (EtOH, cocaine, THC, tobacco), homelessness who is admitted with RML pneumonia and COPD exacerbation. 52 year old F with PMH of COPD, anxiety, bipolar disorder, polysubstance use including EtOH, cocaine, THC and tobacco, homelessness and recent left arm fracture presenting with shortness of breath, productive cough with yellowish phlegm, fever, chills and diaphoresis and admitted with community-acquired pneumonia and COPD exacerbation.  Checks x-ray showed RML infiltrate.  Patient smokes about a pack a day.  She was started on ceftriaxone, Zithromax, steroid and breathing treatments and admitted.   Blood culture with Staph epidermidis with different sensitivity patterns, Staph lugdunensis in 1 out of 2 bottles and Streptococcus species. Vancomycin added.  Repeat blood culture NGTD.  TTE without significant finding.  Antibiotics de-escalated to Ancef.  ID following. Assessment and Plan: No notes have been filed under this hospital service. Service: Hospitalist  Polymicrobial bacteremia?  Contaminated blood sample? Blood culture with Staph epidermidis with different sensitivity patterns, lugdunensis in 1 out of 2 bottles and Streptococcus species.  Denies IVDU.  Repeat blood culture NGTD.  Hepatitis panel negative.  Pro-Cal negative.  TTE without vegetation.  Was on vancomycin and ceftriaxone -Antibiotic de-escalated to IV Ancef -ID following- not necessarily interested in TEE. However, plan to have cards review TTE and advise if TEE will help.   Community-acquired pneumonia of the right middle lobe: CXR with RML infiltrate.  Reportedly hypoxic to 80s on RA prior to arrival to ED. Currentlyl  improved in tersm of sob, cough , pain etc...  Smokes about a pack a day.  COVID-19, influenza and RSV PCR nonreactive.  Blood cultures as above.  Strep pneumo and Legionella antigen negative.  Was on vancomycin, ceftriaxone and Zithromax. -Antibiotic de-escalated to IV Ancef. -Wean oxygen as able -IS, FV, mucolytics   COPD with acute exacerbation: Likely due to the above and smoking.  Improved. -Completed 5 days of steroid -Continue Dulera and Incruse Ellipta -Continue DuoNebs as needed -Antibiotics as above -Encouraged smoking cessation.   Polysubstance substance use disorder: Smokes about a pack a day.  Also admits to using cocaine and drinking alcohol but not quantifying.  Denies IVDU.  UDS positive for opiate, cocaine and benzodiazepine.  Seems to have some withdrawal with nasal congestion, anxiety... -Encouraged smoking cessation and refraining from cocaine and alcohol use -pateitn had Ciwa of 11 ealrier this AM. Started on CIWA protocol. Check TSH -Continue nicotine patch -Continue thiamine, folate, multivitamins -TOC following.   Bipolar disorder/anxiety: Seen by psychiatry on last admission and recommended discontinuation of Depakote and paroxetine due to medication noncompliance and ongoing substance use.  Appears anxious partly due to steroid. -Now off steroid. -Ativan as above -Trazodone 50 mg nightly as needed   Distal left radial fracture: after fall on 12/2.  Seen by orthopedics Dr. Alena Bolton and scheduled for ORIF 12/19. -Pain control with as needed Tylenol, tramadol and oxycodone -Per patient, surgery being delayed due to current infection.   Nasal congestion: Likely due to withdrawal. -Continue Afrin, Flonase and Claritin   Pruritus/skin itch -P.o. Benadryl as needed.  Avoid IV   Rectal prolapse: Chronic issue. -Avoid constipation -Anusol cream -Outpatient follow-up with surgery.   Severe malnutrition: Body mass index is 18.01 kg/m. Nutrition Problem:  Severe Malnutrition  Etiology: social / environmental circumstances Signs/Symptoms: severe fat depletion, severe muscle depletion, percent weight loss (10% in 2 weeks) Percent weight loss: 10 % (in 2 weeks) Interventions: Refer to RD note for recommendations, Ensure Enlive (each supplement provides 350kcal and 20 grams of protein), Magic cup, MVI DVT prophylaxis:  enoxaparin (LOVENOX) injection 40 mg Start: 08/06/23 2200     Subjective: Patient offers no complaints has been tolerating diet  Physical Exam: Vitals:   08/14/23 0810 08/14/23 1029 08/14/23 1230 08/14/23 1648  BP:  112/82 123/76 115/79  Pulse:  (!) 120 (!) 105 (!) 108  Resp:  (!) 22 19 20   Temp:  98.3 F (36.8 C) 98.4 F (36.9 C) 99.3 F (37.4 C)  TempSrc:      SpO2: 92% 97% 92% 94%  Weight:      Height:       General: No distress nontoxic Alert awake oriented Respiratory exam: Bilateral intravesicular Cardiovascular exam S1-S2 normal Abdomen all quadrants soft nontender Extremities: Left forearm in brace, distal function intact Data Reviewed:  Labs on Admission:  Results for orders placed or performed during the hospital encounter of 08/06/23 (from the past 24 hours)  Renal function panel     Status: Abnormal   Collection Time: 08/14/23  4:01 AM  Result Value Ref Range   Sodium 137 135 - 145 mmol/L   Potassium 4.1 3.5 - 5.1 mmol/L   Chloride 100 98 - 111 mmol/L   CO2 27 22 - 32 mmol/L   Glucose, Bld 78 70 - 99 mg/dL   BUN 26 (H) 6 - 20 mg/dL   Creatinine, Ser 6.44 0.44 - 1.00 mg/dL   Calcium 8.9 8.9 - 03.4 mg/dL   Phosphorus 4.3 2.5 - 4.6 mg/dL   Albumin 3.4 (L) 3.5 - 5.0 g/dL   GFR, Estimated >74 >25 mL/min   Anion gap 10 5 - 15  Magnesium     Status: None   Collection Time: 08/14/23  4:01 AM  Result Value Ref Range   Magnesium 2.3 1.7 - 2.4 mg/dL  CBC     Status: Abnormal   Collection Time: 08/14/23  4:01 AM  Result Value Ref Range   WBC 13.1 (H) 4.0 - 10.5 K/uL   RBC 4.21 3.87 - 5.11 MIL/uL    Hemoglobin 13.9 12.0 - 15.0 g/dL   HCT 95.6 38.7 - 56.4 %   MCV 102.9 (H) 80.0 - 100.0 fL   MCH 33.0 26.0 - 34.0 pg   MCHC 32.1 30.0 - 36.0 g/dL   RDW 33.2 95.1 - 88.4 %   Platelets 317 150 - 400 K/uL   nRBC 0.0 0.0 - 0.2 %   Basic Metabolic Panel: Recent Labs  Lab 08/09/23 0327 08/11/23 0327 08/14/23 0401  NA 137 137 137  K 3.8 3.9 4.1  CL 100 100 100  CO2 29 28 27   GLUCOSE 108* 119* 78  BUN 15 17 26*  CREATININE 0.42* 0.61 0.45  CALCIUM 8.4* 8.9 8.9  MG 2.5* 2.2 2.3  PHOS 3.4  --  4.3   Liver Function Tests: Recent Labs  Lab 08/09/23 0327 08/11/23 0327 08/14/23 0401  AST  --  20  --   ALT  --  21  --   ALKPHOS  --  78  --   BILITOT  --  0.4  --   PROT  --  6.3*  --   ALBUMIN 3.4* 3.5 3.4*   No results for input(s): "LIPASE", "AMYLASE" in the last 168  hours. No results for input(s): "AMMONIA" in the last 168 hours. CBC: Recent Labs  Lab 08/09/23 0327 08/11/23 0327 08/14/23 0401  WBC 10.6* 13.1* 13.1*  HGB 13.9 15.1* 13.9  HCT 43.1 47.6* 43.3  MCV 102.9* 103.3* 102.9*  PLT 333 333 317   Cardiac Enzymes: No results for input(s): "CKTOTAL", "CKMB", "CKMBINDEX", "TROPONINIHS" in the last 168 hours.  BNP (last 3 results) No results for input(s): "PROBNP" in the last 8760 hours. CBG: No results for input(s): "GLUCAP" in the last 168 hours.  Radiological Exams on Admission:  No results found.     Disposition: Status is: Inpatient Remains inpatient appropriate because: need iv abx.  Planned Discharge Destination: Home    Time spent: 35 minutes  Author: Nolberto Hanlon, MD 08/14/2023 6:45 PM  For on call review [email or web address].

## 2023-08-14 NOTE — Progress Notes (Signed)
Patient very emotional and tearful.   Patient began seeing "spirits" in her room at roughly 2300. Patient states her "curtains are moving and dark spirits are moving around".  Left the door open, moved the curtains behind door. Dim light on. Reassured patient.

## 2023-08-14 NOTE — Plan of Care (Signed)
  Problem: Health Behavior/Discharge Planning: Goal: Ability to manage health-related needs will improve Outcome: Progressing   Problem: Clinical Measurements: Goal: Ability to maintain clinical measurements within normal limits will improve Outcome: Progressing Goal: Will remain free from infection Outcome: Progressing Goal: Diagnostic test results will improve Outcome: Progressing Goal: Respiratory complications will improve Outcome: Progressing Goal: Cardiovascular complication will be avoided Outcome: Progressing   Problem: Activity: Goal: Risk for activity intolerance will decrease Outcome: Progressing   Problem: Coping: Goal: Level of anxiety will decrease Outcome: Progressing   Problem: Pain Management: Goal: General experience of comfort will improve Outcome: Progressing   Problem: Safety: Goal: Ability to remain free from injury will improve Outcome: Progressing   Problem: Skin Integrity: Goal: Risk for impaired skin integrity will decrease Outcome: Progressing   Problem: Activity: Goal: Ability to tolerate increased activity will improve Outcome: Progressing   Problem: Respiratory: Goal: Ability to maintain adequate ventilation will improve Outcome: Progressing Goal: Ability to maintain a clear airway will improve Outcome: Progressing   Problem: Activity: Goal: Ability to tolerate increased activity will improve Outcome: Progressing   Problem: Clinical Measurements: Goal: Ability to maintain a body temperature in the normal range will improve Outcome: Progressing   Problem: Respiratory: Goal: Ability to maintain adequate ventilation will improve Outcome: Progressing Goal: Ability to maintain a clear airway will improve Outcome: Progressing

## 2023-08-14 NOTE — Progress Notes (Signed)
Subjective:  Patient with multiple concerns now excruciating right knee pain and hip pain prolapse  Antibiotics:  Anti-infectives (From admission, onward)    Start     Dose/Rate Route Frequency Ordered Stop   08/12/23 2200  ceFAZolin (ANCEF) IVPB 2g/100 mL premix  Status:  Discontinued        2 g 200 mL/hr over 30 Minutes Intravenous Every 8 hours 08/12/23 0950 08/12/23 0951   08/12/23 1400  ceFAZolin (ANCEF) IVPB 2g/100 mL premix        2 g 200 mL/hr over 30 Minutes Intravenous Every 8 hours 08/12/23 0951     08/08/23 1800  vancomycin (VANCOCIN) IVPB 1000 mg/200 mL premix  Status:  Discontinued        1,000 mg 200 mL/hr over 60 Minutes Intravenous Every 24 hours 08/07/23 1741 08/12/23 0950   08/07/23 1800  cefTRIAXone (ROCEPHIN) 2 g in sodium chloride 0.9 % 100 mL IVPB        2 g 200 mL/hr over 30 Minutes Intravenous Every 24 hours 08/06/23 1936 08/11/23 1913   08/07/23 1800  azithromycin (ZITHROMAX) 500 mg in sodium chloride 0.9 % 250 mL IVPB  Status:  Discontinued        500 mg 250 mL/hr over 60 Minutes Intravenous Every 24 hours 08/06/23 1936 08/07/23 1142   08/07/23 1800  azithromycin (ZITHROMAX) tablet 500 mg  Status:  Discontinued        500 mg Oral Every 24 hours 08/07/23 1142 08/09/23 0916   08/07/23 1800  vancomycin (VANCOCIN) IVPB 1000 mg/200 mL premix        1,000 mg 200 mL/hr over 60 Minutes Intravenous  Once 08/07/23 1731 08/07/23 1905   08/06/23 1800  cefTRIAXone (ROCEPHIN) 1 g in sodium chloride 0.9 % 100 mL IVPB        1 g 200 mL/hr over 30 Minutes Intravenous  Once 08/06/23 1747 08/06/23 1928   08/06/23 1800  azithromycin (ZITHROMAX) 500 mg in sodium chloride 0.9 % 250 mL IVPB        500 mg 250 mL/hr over 60 Minutes Intravenous  Once 08/06/23 1747 08/06/23 1928       Medications: Scheduled Meds:  acetaminophen  650 mg Oral Q6H WA   enoxaparin (LOVENOX) injection  40 mg Subcutaneous Q24H   feeding supplement  237 mL Oral TID BM   fluticasone   1 spray Each Nare Daily   folic acid  1 mg Oral Daily   guaiFENesin  600 mg Oral BID   hydrocortisone   Rectal BID   loratadine  10 mg Oral Daily   mometasone-formoterol  2 puff Inhalation BID   multivitamin with minerals  1 tablet Oral Daily   nicotine  21 mg Transdermal Daily   pantoprazole  40 mg Oral Daily   thiamine  100 mg Oral Daily   Or   thiamine  100 mg Intravenous Daily   umeclidinium bromide  1 puff Inhalation Daily   Continuous Infusions:   ceFAZolin (ANCEF) IV 2 g (08/14/23 1307)   PRN Meds:.alum & mag hydroxide-simeth, diphenhydrAMINE, ipratropium-albuterol, LORazepam **OR** LORazepam, ondansetron **OR** ondansetron (ZOFRAN) IV, oxyCODONE, senna-docusate, sodium chloride, traMADol, traZODone    Objective: Weight change:   Intake/Output Summary (Last 24 hours) at 08/14/2023 1546 Last data filed at 08/14/2023 1400 Gross per 24 hour  Intake 990 ml  Output --  Net 990 ml   Blood pressure 123/76, pulse (!) 105, temperature 98.4 F (36.9 C), resp. rate  19, height 5\' 5"  (1.651 m), weight 49.1 kg, last menstrual period 10/13/2011, SpO2 92%. Temp:  [97.7 F (36.5 C)-98.7 F (37.1 C)] 98.4 F (36.9 C) (12/18 1230) Pulse Rate:  [96-120] 105 (12/18 1230) Resp:  [17-22] 19 (12/18 1230) BP: (103-123)/(74-82) 123/76 (12/18 1230) SpO2:  [92 %-97 %] 92 % (12/18 1230)  Physical Exam: Physical Exam Constitutional:      Appearance: She is cachectic.  Musculoskeletal:     Right hip: Normal.     Left hip: Normal.     Right knee: Normal.  Neurological:     Mental Status: She is alert.     Left arm wrapped in dressing   CBC:    BMET Recent Labs    08/14/23 0401  NA 137  K 4.1  CL 100  CO2 27  GLUCOSE 78  BUN 26*  CREATININE 0.45  CALCIUM 8.9     Liver Panel  Recent Labs    08/14/23 0401  ALBUMIN 3.4*       Sedimentation Rate No results for input(s): "ESRSEDRATE" in the last 72 hours. C-Reactive Protein No results for input(s): "CRP" in  the last 72 hours.  Micro Results: Recent Results (from the past 720 hours)  Urine Culture     Status: Abnormal   Collection Time: 08/01/23  9:12 PM   Specimen: Urine, Clean Catch  Result Value Ref Range Status   Specimen Description URINE, CLEAN CATCH  Final   Special Requests   Final    NONE Performed at [facility], [address] [place], [place] [address]    Culture >=100,000 COLONIES/mL ESCHERICHIA COLI (A)  Final   Report Status 08/04/2023 FINAL  Final   Organism ID, Bacteria ESCHERICHIA COLI (A)  Final      Susceptibility   Escherichia coli - MIC*    AMPICILLIN 4 SENSITIVE Sensitive     CEFEPIME <=0.12 SENSITIVE Sensitive     CEFTRIAXONE <=0.25 SENSITIVE Sensitive     CIPROFLOXACIN <=0.25 SENSITIVE Sensitive     GENTAMICIN <=1 SENSITIVE Sensitive     IMIPENEM <=0.25 SENSITIVE Sensitive     TRIMETH/SULFA <=20 SENSITIVE Sensitive     AMPICILLIN/SULBACTAM <=2 SENSITIVE Sensitive     PIP/TAZO <=4 SENSITIVE Sensitive ug/mL    * >=100,000 COLONIES/mL ESCHERICHIA COLI  Blood culture (routine x 2)     Status: Abnormal   Collection Time: 08/06/23  6:05 PM   Specimen: BLOOD RIGHT FOREARM  Result Value Ref Range Status   Specimen Description   Final    BLOOD RIGHT FOREARM Performed at [facility], [address] [place], [place] [address]    Special Requests   Final    BOTTLES DRAWN AEROBIC AND ANAEROBIC Blood Culture results may not be optimal due to an inadequate volume of blood received in culture bottles Performed at [facility], [address] [place], [place] [address]    Culture  Setup Time   Final    GRAM POSITIVE COCCI ANAEROBIC BOTTLE ONLY CRITICAL RESULT CALLED TO, READ BACK BY AND VERIFIED WITH: PHARMD MELISSA JAMES ON 08/07/23 @ 1709 BY DRT Performed at [facility], [address] [place], [place] [address]    Culture (A)  Final    STAPHYLOCOCCUS EPIDERMIDIS Two isolates with different morphologies were identified  as the same organism.The most resistant organism was reported. STAPHYLOCOCCUS LUGDUNENSIS    Report Status 08/11/2023 FINAL  Final   Organism ID, Bacteria STAPHYLOCOCCUS EPIDERMIDIS  Final  Organism ID, Bacteria STAPHYLOCOCCUS LUGDUNENSIS  Final      Susceptibility   Staphylococcus epidermidis - MIC*    CIPROFLOXACIN <=0.5 SENSITIVE Sensitive     ERYTHROMYCIN <=0.25 SENSITIVE Sensitive     GENTAMICIN <=0.5 SENSITIVE Sensitive     OXACILLIN <=0.25 SENSITIVE Sensitive     TETRACYCLINE >=16 RESISTANT Resistant     VANCOMYCIN 2 SENSITIVE Sensitive     TRIMETH/SULFA <=10 SENSITIVE Sensitive     CLINDAMYCIN <=0.25 SENSITIVE Sensitive     RIFAMPIN <=0.5 SENSITIVE Sensitive     Inducible Clindamycin NEGATIVE Sensitive     * STAPHYLOCOCCUS EPIDERMIDIS   Staphylococcus lugdunensis - MIC*    CIPROFLOXACIN <=0.5 SENSITIVE Sensitive     ERYTHROMYCIN <=0.25 SENSITIVE Sensitive     GENTAMICIN <=0.5 SENSITIVE Sensitive     OXACILLIN 2 SENSITIVE Sensitive     TETRACYCLINE <=1 SENSITIVE Sensitive     VANCOMYCIN <=0.5 SENSITIVE Sensitive     TRIMETH/SULFA <=10 SENSITIVE Sensitive     CLINDAMYCIN <=0.25 SENSITIVE Sensitive     RIFAMPIN <=0.5 SENSITIVE Sensitive     Inducible Clindamycin NEGATIVE Sensitive     * STAPHYLOCOCCUS LUGDUNENSIS  Blood Culture ID Panel (Reflexed)     Status: Abnormal   Collection Time: 08/06/23  6:05 PM  Result Value Ref Range Status   Enterococcus faecalis NOT DETECTED NOT DETECTED Final   Enterococcus Faecium NOT DETECTED NOT DETECTED Final   Listeria monocytogenes NOT DETECTED NOT DETECTED Final   Staphylococcus species DETECTED (A) NOT DETECTED Final    Comment: CRITICAL RESULT CALLED TO, READ BACK BY AND VERIFIED WITH: PHARMD MELISSA JAMES ON 08/07/23 @ 1709 BY DRT    Staphylococcus aureus (BCID) NOT DETECTED NOT DETECTED Final   Staphylococcus epidermidis DETECTED (A) NOT DETECTED Final    Comment: Methicillin (oxacillin) resistant coagulase negative  staphylococcus. Possible blood culture contaminant (unless isolated from more than one blood culture draw or clinical case suggests pathogenicity). No antibiotic treatment is indicated for blood  culture contaminants. CRITICAL RESULT CALLED TO, READ BACK BY AND VERIFIED WITH: PHARMD MELISSA JAMES ON 08/07/23 @ 1709 BY DRT    Staphylococcus lugdunensis DETECTED (A) NOT DETECTED Final    Comment: Methicillin (oxacillin) resistant coagulase negative staphylococcus. Possible blood culture contaminant (unless isolated from more than one blood culture draw or clinical case suggests pathogenicity). No antibiotic treatment is indicated for blood  culture contaminants. CRITICAL RESULT CALLED TO, READ BACK BY AND VERIFIED WITH: PHARMD MELISSA JAMES ON 08/07/23 @ 1709 BY DRT    Streptococcus species DETECTED (A) NOT DETECTED Final    Comment: Not Enterococcus species, Streptococcus agalactiae, Streptococcus pyogenes, or Streptococcus pneumoniae. CRITICAL RESULT CALLED TO, READ BACK BY AND VERIFIED WITH: PHARMD MELISSA JAMES ON 08/07/23 @ 1709 BY DRT    Streptococcus agalactiae NOT DETECTED NOT DETECTED Final   Streptococcus pneumoniae NOT DETECTED NOT DETECTED Final   Streptococcus pyogenes NOT DETECTED NOT DETECTED Final   A.calcoaceticus-baumannii NOT DETECTED NOT DETECTED Final   Bacteroides fragilis NOT DETECTED NOT DETECTED Final   Enterobacterales NOT DETECTED NOT DETECTED Final   Enterobacter cloacae complex NOT DETECTED NOT DETECTED Final   Escherichia coli NOT DETECTED NOT DETECTED Final   Klebsiella aerogenes NOT DETECTED NOT DETECTED Final   Klebsiella oxytoca NOT DETECTED NOT DETECTED Final   Klebsiella pneumoniae NOT DETECTED NOT DETECTED Final   Proteus species NOT DETECTED NOT DETECTED Final   Salmonella species NOT DETECTED NOT DETECTED Final   Serratia marcescens NOT DETECTED NOT DETECTED Final   Haemophilus influenzae  NOT DETECTED NOT DETECTED Final   Neisseria meningitidis NOT  DETECTED NOT DETECTED Final   Pseudomonas aeruginosa NOT DETECTED NOT DETECTED Final   Stenotrophomonas maltophilia NOT DETECTED NOT DETECTED Final   Candida albicans NOT DETECTED NOT DETECTED Final   Candida auris NOT DETECTED NOT DETECTED Final   Candida glabrata NOT DETECTED NOT DETECTED Final   Candida krusei NOT DETECTED NOT DETECTED Final   Candida parapsilosis NOT DETECTED NOT DETECTED Final   Candida tropicalis NOT DETECTED NOT DETECTED Final   Cryptococcus neoformans/gattii NOT DETECTED NOT DETECTED Final   Methicillin resistance mecA/C DETECTED (A) NOT DETECTED Final    Comment: CRITICAL RESULT CALLED TO, READ BACK BY AND VERIFIED WITH: PHARMD MELISSA JAMES ON 08/07/23 @ 1709 BY DRT Performed at [facility], [address], [place], [place] [address]   Blood culture (routine x 2)     Status: Abnormal   Collection Time: 08/06/23  6:15 PM   Specimen: BLOOD  Result Value Ref Range Status   Specimen Description   Final    BLOOD BLOOD RIGHT WRIST Performed at [facility], [address] [place], [place] [address]    Special Requests   Final    BOTTLES DRAWN AEROBIC AND ANAEROBIC Blood Culture results may not be optimal due to an inadequate volume of blood received in culture bottles Performed at [facility], [address] [place], [place] [address]    Culture  Setup Time   Final    GRAM POSITIVE COCCI IN CLUSTERS ANAEROBIC BOTTLE ONLY CRITICAL VALUE NOTED.  VALUE IS CONSISTENT WITH PREVIOUSLY REPORTED AND CALLED VALUE. Performed at [facility], [address] [place], [place] [address]    Culture STAPHYLOCOCCUS EPIDERMIDIS (A)  Final   Report Status 08/09/2023 FINAL  Final   Organism ID, Bacteria STAPHYLOCOCCUS EPIDERMIDIS  Final      Susceptibility   Staphylococcus epidermidis - MIC*    CIPROFLOXACIN <=0.5 SENSITIVE Sensitive     ERYTHROMYCIN <=0.25 SENSITIVE Sensitive     GENTAMICIN <=0.5 SENSITIVE Sensitive      OXACILLIN <=0.25 SENSITIVE Sensitive     TETRACYCLINE <=1 SENSITIVE Sensitive     VANCOMYCIN 1 SENSITIVE Sensitive     TRIMETH/SULFA <=10 SENSITIVE Sensitive     CLINDAMYCIN <=0.25 SENSITIVE Sensitive     RIFAMPIN <=0.5 SENSITIVE Sensitive     Inducible Clindamycin NEGATIVE Sensitive     * STAPHYLOCOCCUS EPIDERMIDIS  Resp panel by RT-PCR (RSV, Flu A&B, Covid) Anterior Nasal Swab     Status: None   Collection Time: 08/07/23 12:51 AM   Specimen: Anterior Nasal Swab  Result Value Ref Range Status   SARS Coronavirus 2 by RT PCR NEGATIVE NEGATIVE Final    Comment: (NOTE) SARS-CoV-2 target nucleic acids are NOT DETECTED.  The SARS-CoV-2 RNA is generally detectable in upper respiratory specimens during the acute phase of infection. The lowest concentration of SARS-CoV-2 viral copies this assay can detect is 138 copies/mL. A negative result does not preclude SARS-Cov-2 infection and should not be used as the sole basis for treatment or other patient management decisions. A negative result may occur with  improper specimen collection/handling, submission of specimen other than nasopharyngeal swab, presence of viral mutation(s) within the areas targeted by this assay, and inadequate number of viral copies(<138 copies/mL). A negative result must be combined with clinical observations, patient history, and epidemiological information. The expected result is Negative.  Fact Sheet for Patients:  BloggerCourse.com  Fact Sheet for Healthcare  Providers:  SeriousBroker.it  This test is no t yet approved or cleared by the [place] and  has been authorized for detection and/or diagnosis of SARS-CoV-2 by FDA under an Emergency Use Authorization (EUA). This EUA will remain  in effect (meaning this test can be used) for the duration of the COVID-19 declaration under Section 564(b)(1) of the Act, 21 U.S.C.section 360bbb-3(b)(1), unless the  authorization is terminated  or revoked sooner.       Influenza A by PCR NEGATIVE NEGATIVE Final   Influenza B by PCR NEGATIVE NEGATIVE Final    Comment: (NOTE) The Xpert Xpress SARS-CoV-2/FLU/RSV plus assay is intended as an aid in the diagnosis of influenza from Nasopharyngeal swab specimens and should not be used as a sole basis for treatment. Nasal washings and aspirates are unacceptable for Xpert Xpress SARS-CoV-2/FLU/RSV testing.  Fact Sheet for Patients: BloggerCourse.com  Fact Sheet for Healthcare Providers: SeriousBroker.it  This test is not yet approved or cleared by the [place] FDA and has been authorized for detection and/or diagnosis of SARS-CoV-2 by FDA under an Emergency Use Authorization (EUA). This EUA will remain in effect (meaning this test can be used) for the duration of the COVID-19 declaration under Section 564(b)(1) of the Act, 21 U.S.C. section 360bbb-3(b)(1), unless the authorization is terminated or revoked.     Resp Syncytial Virus by PCR NEGATIVE NEGATIVE Final    Comment: (NOTE) Fact Sheet for Patients: BloggerCourse.com  Fact Sheet for Healthcare Providers: SeriousBroker.it  This test is not yet approved or cleared by the [place] FDA and has been authorized for detection and/or diagnosis of SARS-CoV-2 by FDA under an Emergency Use Authorization (EUA). This EUA will remain in effect (meaning this test can be used) for the duration of the COVID-19 declaration under Section 564(b)(1) of the Act, 21 U.S.C. section 360bbb-3(b)(1), unless the authorization is terminated or revoked.  Performed at [facility], [address], [place], [place] [address]   Expectorated Sputum Assessment w Gram Stain, Rflx to Resp Cult     Status: None   Collection Time: 08/07/23  4:39 AM   Specimen: Expectorated Sputum  Result Value  Ref Range Status   Specimen Description EXPECTORATED SPUTUM  Final   Special Requests NONE  Final   Sputum evaluation   Final    Sputum specimen not acceptable for testing.  Please recollect.   NOTIFIED Janace Hoard RN AT [phone] ON 08/07/2023 BY MECIAL J. Performed at [facility], [address], [place], [place] [address]    Report Status 08/07/2023 FINAL  Final  Culture, blood (Routine X 2) w Reflex to ID Panel     Status: None   Collection Time: 08/08/23  9:29 AM   Specimen: BLOOD  Result Value Ref Range Status   Specimen Description   Final    BLOOD SITE NOT SPECIFIED Performed at [facility] [address], [place], [place] [address]    Special Requests   Final    BOTTLES DRAWN AEROBIC AND ANAEROBIC Blood Culture results may not be optimal due to an inadequate volume of blood received in culture bottles Performed at [facility], [address], [place], [place] [address]    Culture   Final    NO GROWTH 5 DAYS Performed at [facility], [address] [place], [place] [address]    Report Status 08/13/2023 FINAL  Final  Culture, blood (Routine X 2) w Reflex to ID Panel  Status: None   Collection Time: 08/08/23  9:41 AM   Specimen: BLOOD  Result Value Ref Range Status   Specimen Description   Final    BLOOD SITE NOT SPECIFIED Performed at [facility], [address], [place], [place] [address]    Special Requests   Final    BOTTLES DRAWN AEROBIC AND ANAEROBIC Blood Culture results may not be optimal due to an inadequate volume of blood received in culture bottles Performed at [facility], [address], [place], [place] [address]    Culture   Final    NO GROWTH 5 DAYS Performed at [facility], [address], [place], [place] [address]    Report Status 08/13/2023 FINAL  Final    Studies/Results: No results found.    Assessment/Plan:  INTERVAL HISTORY:  case discussed with Cardiology  Principal Problem:   Community acquired pneumonia of right middle lobe of lung Active Problems:   Bipolar disorder (HCC)   COPD (chronic obstructive pulmonary disease) (HCC)   Substance use disorder   Closed fracture of distal end of left radius   Bacteremia   Shortness of breath   Vaginal prolapse   Homelessness    Stacy Bolton is a 52 y.o. female with  polysubstance abuse (BUT NO IVDU she is adamant about this) seizures homeless nests who sustained a fracture of her left wrist who was admitted and found to have right middle lobe pneumonia.  Blood cultures taken on admission as well.  The [facility] ID noted up to coccal species Staphylococcus lugdunensis and Staphylococcus epidermidis.  Phenotypically 2 different Staphylococcus epidermidis species grew in the 2 different sites that were cultured which are discordant and consistent with contamination staph lugdunensis did grow in 1 of 2 sites.  #1 Bacteremia:  Narrowed to ancef  TTE did not show endocarditis though aortic valve not optimally visualized  I am SKEPTICAL that TEE will be high yield though the patient is asking to have this done  I called Cards Master today and she will have one of the cardiologist reviewed the 2D echocardiogram films again.    Additionally while she DID grow Staph lugdunensis which is known to cause endocarditis she only grew in 1/2 cultures and both were contaminated by staph epidermidis. We dont like to ever think of MRSA, MSSA, Staph lugdunensis as being contaminants but if she had endocarditis I would expect her to have 2/2 cultures + for offending organism  If we do not find endocarditis (if we look for it any further with TEE) then I think 2 weeks of antibiotics will be sufficient. She is already on day 9 of effective antibiotics.   I am distrustful of her ability to even take pills when she leaves if she goes back to the "streets" and we may want to consider  giving her Oritavancin  #2 Polysubstance abuse, opiates, (which she denied) benzos and cocaine  #3 Homelessness: hopefully a stable solution for her can be found  #4 Vaginal prolapse: she perseverates about this issue which is not really in my "wheelhouse to address"   #5 Fracture of radius: surgery delayed  #6 Knee pain and hip pain: exam is benign  #6 ? Secondary gain: Given her homelessness and chaotic life outside the hospital staying here as long as possible is undoubtedly something she might wish for  I have personally spent 51 minutes involved in face-to-face and non-face-to-face activities for this patient on the day of the visit.  Professional time spent includes the following activities: Preparing to see the patient (review of tests), Obtaining and/or reviewing separately obtained history (admission/discharge record), Performing a medically appropriate examination and/or evaluation , Ordering medications/tests/procedures, referring and communicating with other health care professionals, Documenting clinical information in the EMR, Independently interpreting results (not separately reported), Communicating results to the patient/family/caregiver, Counseling and educating the patient/family/caregiver and Care coordination (not separately reported).      LOS: 8 days   Acey Lav 08/14/2023, 3:46 PM

## 2023-08-15 ENCOUNTER — Ambulatory Visit (HOSPITAL_COMMUNITY): Admission: RE | Admit: 2023-08-15 | Payer: Self-pay | Source: Home / Self Care | Admitting: Orthopedic Surgery

## 2023-08-15 ENCOUNTER — Encounter (HOSPITAL_COMMUNITY): Admission: RE | Payer: Self-pay | Source: Home / Self Care

## 2023-08-15 LAB — BASIC METABOLIC PANEL
Anion gap: 8 (ref 5–15)
BUN: 24 mg/dL — ABNORMAL HIGH (ref 6–20)
CO2: 30 mmol/L (ref 22–32)
Calcium: 8.7 mg/dL — ABNORMAL LOW (ref 8.9–10.3)
Chloride: 97 mmol/L — ABNORMAL LOW (ref 98–111)
Creatinine, Ser: 0.47 mg/dL (ref 0.44–1.00)
GFR, Estimated: >60 mL/min (ref >60)
Glucose, Bld: 92 mg/dL (ref 70–99)
Potassium: 4.1 mmol/L (ref 3.5–5.1)
Sodium: 135 mmol/L (ref 135–145)

## 2023-08-15 LAB — TSH: TSH: 17.644 u[IU]/mL — ABNORMAL HIGH (ref 0.350–4.500)

## 2023-08-15 SURGERY — OPEN REDUCTION INTERNAL FIXATION (ORIF) DISTAL RADIUS FRACTURE
Anesthesia: Choice | Laterality: Left

## 2023-08-15 NOTE — Progress Notes (Signed)
        Date: 08/15/2023  Patient name: Stacy Bolton  Medical record number: [id number]  Date of birth: Jan 19, 1971   Cardiology got back to me and TTE read again by another cardiologist  They reported that  echo tech did not get parasternal views because of COPD, but that on all of the other images the aortic valve looked normal. No thickening, no regurgitation. Nothing that would be suspicious   Given this data and what I have said before I would NOT pursue TEE.    Acey Lav 08/15/2023, 4:37 PM

## 2023-08-15 NOTE — Plan of Care (Signed)
Problem: Health Behavior/Discharge Planning: Goal: Ability to manage health-related needs will improve Outcome: Progressing   Problem: Clinical Measurements: Goal: Ability to maintain clinical measurements within normal limits will improve Outcome: Progressing   Problem: Activity: Goal: Risk for activity intolerance will decrease Outcome: Progressing   Problem: Coping: Goal: Level of anxiety will decrease Outcome: Progressing   Problem: Safety: Goal: Ability to remain free from injury will improve Outcome: Progressing   Haydee Salter, RN .today 7:40 PM

## 2023-08-15 NOTE — Plan of Care (Signed)
  Problem: Clinical Measurements: Goal: Respiratory complications will improve Outcome: Progressing Goal: Cardiovascular complication will be avoided Outcome: Progressing   Problem: Activity: Goal: Risk for activity intolerance will decrease Outcome: Progressing   Problem: Safety: Goal: Ability to remain free from injury will improve Outcome: Progressing   Problem: Activity: Goal: Ability to tolerate increased activity will improve Outcome: Progressing   Problem: Respiratory: Goal: Ability to maintain adequate ventilation will improve Outcome: Progressing Goal: Ability to maintain a clear airway will improve Outcome: Progressing   Problem: Activity: Goal: Ability to tolerate increased activity will improve Outcome: Progressing

## 2023-08-15 NOTE — Progress Notes (Signed)
PROGRESS NOTE  Stacy Bolton BMW:[id number] DOB: Jan 22, 1971   PCP: Patient, No Pcp Per  Patient is from: [facility] shelter   DOA: 08/06/2023 LOS: 9  Chief complaints Chief Complaint  Patient presents with   Shortness of Breath     Brief Narrative / Interim history: 52 year old F with PMH of COPD, anxiety, bipolar disorder, polysubstance use including EtOH, cocaine, THC and tobacco, homelessness and recent left arm fracture presenting with shortness of breath, productive cough with yellowish phlegm, fever, chills and diaphoresis and admitted with community-acquired pneumonia and COPD exacerbation.  Checks x-ray showed RML infiltrate.  Patient smokes about a pack a day.  She was started on ceftriaxone, Zithromax, steroid and breathing treatments and admitted.  Blood culture with Staph epidermidis with different sensitivity patterns, Staph lugdunensis in 1 out of 2 bottles and Streptococcus species. Vancomycin added.  Repeat blood culture NGTD.  TTE without significant finding.  Antibiotics de-escalated to Ancef.  ID following.  Subjective: Seen and examined earlier this morning.  No major events overnight of this morning.  No new complaints other than usual nasal congestion and some chest tightness.  She was told by her orthopedic office that her left arm repair has to be delayed due to current infection.  She is unhappy about this.  Objective: Vitals:   08/14/23 1648 08/14/23 2202 08/15/23 0529 08/15/23 0850  BP: 115/79 107/77 119/71   Pulse: (!) 108 95 84   Resp: 20 18 14    Temp: 99.3 F (37.4 C) 98.3 F (36.8 C) 97.6 F (36.4 C)   TempSrc:  Oral    SpO2: 94% 94% 94% 93%  Weight:      Height:        Examination:  GENERAL: No apparent distress.  Nontoxic. HEENT: MMM.  Vision and hearing grossly intact.  Nasal congestion. NECK: Supple.  No apparent JVD.  RESP:  No IWOB.  Fair aeration bilaterally.  Some rhonchi bilaterally. CVS:  RRR. Heart  sounds normal.  ABD/GI/GU: BS+. Abd soft, NTND.  MSK/EXT:  Moves extremities.  Left forearm in splint. SKIN: no apparent skin lesion or wound NEURO: Awake, alert and oriented appropriately.  No apparent focal neuro deficit. PSYCH: Appears anxious.  Procedures:  None  Microbiology summarized: COVID-19, influenza and RSV PCR nonreactive Sputum sample not acceptable for culture. Blood culture with Staph epidermidis with 2 different sensitive patterns, lugdunensis in 1 out of 2 bottles and Streptococcus species Repeat blood culture NGTD.  Assessment and plan: Polymicrobial bacteremia?  Contaminated blood sample? Blood culture with Staph epidermidis with different sensitivity patterns, lugdunensis in 1 out of 2 bottles and Streptococcus species.  Denies IVDU.  Repeat blood culture NGTD.  Hepatitis panel negative.  Pro-Cal negative.  TTE without vegetation.  Was on vancomycin and ceftriaxone -Antibiotic de-escalated to IV Ancef -ID following-TEE?  Community-acquired pneumonia of the right middle lobe: CXR with RML infiltrate.  Reportedly hypoxic to 80s on RA prior to arrival to ED. Still with productive cough, rhonchi and chest congestion.  Smokes about a pack a day.  COVID-19, influenza and RSV PCR nonreactive.  Blood cultures as above.  Strep pneumo and Legionella antigen negative.  Was on vancomycin, ceftriaxone and Zithromax. -Antibiotic de-escalated to IV Ancef. -IS, FV, mucolytics   COPD with acute exacerbation: Likely due to the above and smoking.  Improved. -Completed 5 days of steroid -Continue Dulera and Incruse Ellipta -Continue DuoNebs as needed -Antibiotics as above -Encouraged smoking cessation.  Polysubstance substance use disorder: Smokes about a  pack a day.  Also admits to using cocaine and drinking alcohol but not quantifying.  Denies IVDU.  UDS positive for opiate, cocaine and benzodiazepine.  Seems to have some withdrawal with nasal congestion, anxiety... -Encouraged  smoking cessation and refraining from cocaine and alcohol use -Ativan as needed -Continue nicotine patch -Continue thiamine, folate, multivitamins -TOC following.   Bipolar disorder/anxiety: Seen by psychiatry on last admission and recommended discontinuation of Depakote and paroxetine due to medication noncompliance and ongoing substance use.  Appears anxious partly due to steroid. -Now off steroid. -Ativan as above -Trazodone 50 mg nightly as needed   Distal left radial fracture: after fall on 12/2.  Seen by orthopedics Dr. Alena Bills and scheduled for ORIF 12/19. -Pain control with as needed Tylenol, tramadol and oxycodone -Per patient, surgery being delayed due to current infection.  Nasal congestion: Likely due to withdrawal. -Continue Afrin, Flonase and Claritin  Pruritus/skin itch -P.o. Benadryl as needed.  Avoid IV  Rectal prolapse: Chronic issue. -Avoid constipation -Anusol cream -Outpatient follow-up  Severe malnutrition: Body mass index is 18.01 kg/m. Nutrition Problem: Severe Malnutrition Etiology: social / environmental circumstances Signs/Symptoms: severe fat depletion, severe muscle depletion, percent weight loss (10% in 2 weeks) Percent weight loss: 10 % (in 2 weeks) Interventions: Refer to RD note for recommendations, Ensure Enlive (each supplement provides 350kcal and 20 grams of protein), Magic cup, MVI   DVT prophylaxis:  enoxaparin (LOVENOX) injection 40 mg Start: 08/06/23 2200  Code Status: Full code Family Communication: Updated patient sister in person. Level of care: Med-Surg Status is: Inpatient Remains inpatient appropriate because: Community-acquired pneumonia, COPD exacerbation and bacteremia   Final disposition: Homeless shelter Consultants:  Infectious disease  35 minutes with more than 50% spent in reviewing records, counseling patient/family and coordinating care.   Sch Meds:  Scheduled Meds:  acetaminophen  650 mg Oral Q6H WA    enoxaparin (LOVENOX) injection  40 mg Subcutaneous Q24H   feeding supplement  237 mL Oral TID BM   fluticasone  1 spray Each Nare Daily   folic acid  1 mg Oral Daily   guaiFENesin  600 mg Oral BID   hydrocortisone   Rectal BID   loratadine  10 mg Oral Daily   mometasone-formoterol  2 puff Inhalation BID   multivitamin with minerals  1 tablet Oral Daily   nicotine  21 mg Transdermal Daily   pantoprazole  40 mg Oral Daily   thiamine  100 mg Oral Daily   Or   thiamine  100 mg Intravenous Daily   umeclidinium bromide  1 puff Inhalation Daily   Continuous Infusions:   ceFAZolin (ANCEF) IV Stopped (08/15/23 0630)   PRN Meds:.alum & mag hydroxide-simeth, diphenhydrAMINE, ipratropium-albuterol, LORazepam **OR** LORazepam, ondansetron **OR** ondansetron (ZOFRAN) IV, oxyCODONE, senna-docusate, sodium chloride, traMADol, traZODone  Antimicrobials: Anti-infectives (From admission, onward)    Start     Dose/Rate Route Frequency Ordered Stop   08/12/23 2200  ceFAZolin (ANCEF) IVPB 2g/100 mL premix  Status:  Discontinued        2 g 200 mL/hr over 30 Minutes Intravenous Every 8 hours 08/12/23 0950 08/12/23 0951   08/12/23 1400  ceFAZolin (ANCEF) IVPB 2g/100 mL premix        2 g 200 mL/hr over 30 Minutes Intravenous Every 8 hours 08/12/23 0951     08/08/23 1800  vancomycin (VANCOCIN) IVPB 1000 mg/200 mL premix  Status:  Discontinued        1,000 mg 200 mL/hr over 60 Minutes Intravenous Every 24  hours 08/07/23 1741 08/12/23 0950   08/07/23 1800  cefTRIAXone (ROCEPHIN) 2 g in sodium chloride 0.9 % 100 mL IVPB        2 g 200 mL/hr over 30 Minutes Intravenous Every 24 hours 08/06/23 1936 08/11/23 1913   08/07/23 1800  azithromycin (ZITHROMAX) 500 mg in sodium chloride 0.9 % 250 mL IVPB  Status:  Discontinued        500 mg 250 mL/hr over 60 Minutes Intravenous Every 24 hours 08/06/23 1936 08/07/23 1142   08/07/23 1800  azithromycin (ZITHROMAX) tablet 500 mg  Status:  Discontinued        500 mg  Oral Every 24 hours 08/07/23 1142 08/09/23 0916   08/07/23 1800  vancomycin (VANCOCIN) IVPB 1000 mg/200 mL premix        1,000 mg 200 mL/hr over 60 Minutes Intravenous  Once 08/07/23 1731 08/07/23 1905   08/06/23 1800  cefTRIAXone (ROCEPHIN) 1 g in sodium chloride 0.9 % 100 mL IVPB        1 g 200 mL/hr over 30 Minutes Intravenous  Once 08/06/23 1747 08/06/23 1928   08/06/23 1800  azithromycin (ZITHROMAX) 500 mg in sodium chloride 0.9 % 250 mL IVPB        500 mg 250 mL/hr over 60 Minutes Intravenous  Once 08/06/23 1747 08/06/23 1928        I have personally reviewed the following labs and images: CBC: Recent Labs  Lab 08/09/23 0327 08/11/23 0327 08/14/23 0401  WBC 10.6* 13.1* 13.1*  HGB 13.9 15.1* 13.9  HCT 43.1 47.6* 43.3  MCV 102.9* 103.3* 102.9*  PLT 333 333 317   BMP &GFR Recent Labs  Lab 08/09/23 0327 08/11/23 0327 08/14/23 0401 08/15/23 0404  NA 137 137 137 135  K 3.8 3.9 4.1 4.1  CL 100 100 100 97*  CO2 29 28 27 30   GLUCOSE 108* 119* 78 92  BUN 15 17 26* 24*  CREATININE 0.42* 0.61 0.45 0.47  CALCIUM 8.4* 8.9 8.9 8.7*  MG 2.5* 2.2 2.3  --   PHOS 3.4  --  4.3  --    Estimated Creatinine Clearance: 62.3 mL/min (by C-G formula based on SCr of 0.47 mg/dL). Liver & Pancreas: Recent Labs  Lab 08/09/23 0327 08/11/23 0327 08/14/23 0401  AST  --  20  --   ALT  --  21  --   ALKPHOS  --  78  --   BILITOT  --  0.4  --   PROT  --  6.3*  --   ALBUMIN 3.4* 3.5 3.4*   No results for input(s): "LIPASE", "AMYLASE" in the last 168 hours.  No results for input(s): "AMMONIA" in the last 168 hours. Diabetic: No results for input(s): "HGBA1C" in the last 72 hours. No results for input(s): "GLUCAP" in the last 168 hours. Cardiac Enzymes: No results for input(s): "CKTOTAL", "CKMB", "CKMBINDEX", "TROPONINI" in the last 168 hours. No results for input(s): "PROBNP" in the last 8760 hours. Coagulation Profile: No results for input(s): "INR", "PROTIME" in the last 168  hours. Thyroid Function Tests: Recent Labs    08/15/23 0404  TSH 17.644*   Lipid Profile: No results for input(s): "CHOL", "HDL", "LDLCALC", "TRIG", "CHOLHDL", "LDLDIRECT" in the last 72 hours. Anemia Panel: No results for input(s): "VITAMINB12", "FOLATE", "FERRITIN", "TIBC", "IRON", "RETICCTPCT" in the last 72 hours. Urine analysis:    Component Value Date/Time   COLORURINE YELLOW 08/01/2023 2112   APPEARANCEUR HAZY (A) 08/01/2023 2112   LABSPEC 1.025 08/01/2023  2112   PHURINE 5.0 08/01/2023 2112   GLUCOSEU NEGATIVE 08/01/2023 2112   HGBUR NEGATIVE 08/01/2023 2112   BILIRUBINUR NEGATIVE 08/01/2023 2112   KETONESUR NEGATIVE 08/01/2023 2112   PROTEINUR NEGATIVE 08/01/2023 2112   UROBILINOGEN 1.0 03/19/2010 1431   NITRITE POSITIVE (A) 08/01/2023 2112   LEUKOCYTESUR NEGATIVE 08/01/2023 2112   Sepsis Labs: Invalid input(s): "PROCALCITONIN", "LACTICIDVEN"  Microbiology: Recent Results (from the past 240 hours)  Blood culture (routine x 2)     Status: Abnormal   Collection Time: 08/06/23  6:05 PM   Specimen: BLOOD RIGHT FOREARM  Result Value Ref Range Status   Specimen Description   Final    BLOOD RIGHT FOREARM Performed at [facility], [address], [place], [place] [address]    Special Requests   Final    BOTTLES DRAWN AEROBIC AND ANAEROBIC Blood Culture results may not be optimal due to an inadequate volume of blood received in culture bottles Performed at [facility], [address] [place], [place] [address]    Culture  Setup Time   Final    GRAM POSITIVE COCCI ANAEROBIC BOTTLE ONLY CRITICAL RESULT CALLED TO, READ BACK BY AND VERIFIED WITH: PHARMD MELISSA JAMES ON 08/07/23 @ 1709 BY DRT Performed at [facility], [address], [place], [place] [address]    Culture (A)  Final    STAPHYLOCOCCUS EPIDERMIDIS Two isolates with different morphologies were identified as the same organism.The most resistant organism was  reported. STAPHYLOCOCCUS LUGDUNENSIS    Report Status 08/11/2023 FINAL  Final   Organism ID, Bacteria STAPHYLOCOCCUS EPIDERMIDIS  Final   Organism ID, Bacteria STAPHYLOCOCCUS LUGDUNENSIS  Final      Susceptibility   Staphylococcus epidermidis - MIC*    CIPROFLOXACIN <=0.5 SENSITIVE Sensitive     ERYTHROMYCIN <=0.25 SENSITIVE Sensitive     GENTAMICIN <=0.5 SENSITIVE Sensitive     OXACILLIN <=0.25 SENSITIVE Sensitive     TETRACYCLINE >=16 RESISTANT Resistant     VANCOMYCIN 2 SENSITIVE Sensitive     TRIMETH/SULFA <=10 SENSITIVE Sensitive     CLINDAMYCIN <=0.25 SENSITIVE Sensitive     RIFAMPIN <=0.5 SENSITIVE Sensitive     Inducible Clindamycin NEGATIVE Sensitive     * STAPHYLOCOCCUS EPIDERMIDIS   Staphylococcus lugdunensis - MIC*    CIPROFLOXACIN <=0.5 SENSITIVE Sensitive     ERYTHROMYCIN <=0.25 SENSITIVE Sensitive     GENTAMICIN <=0.5 SENSITIVE Sensitive     OXACILLIN 2 SENSITIVE Sensitive     TETRACYCLINE <=1 SENSITIVE Sensitive     VANCOMYCIN <=0.5 SENSITIVE Sensitive     TRIMETH/SULFA <=10 SENSITIVE Sensitive     CLINDAMYCIN <=0.25 SENSITIVE Sensitive     RIFAMPIN <=0.5 SENSITIVE Sensitive     Inducible Clindamycin NEGATIVE Sensitive     * STAPHYLOCOCCUS LUGDUNENSIS  Blood Culture ID Panel (Reflexed)     Status: Abnormal   Collection Time: 08/06/23  6:05 PM  Result Value Ref Range Status   Enterococcus faecalis NOT DETECTED NOT DETECTED Final   Enterococcus Faecium NOT DETECTED NOT DETECTED Final   Listeria monocytogenes NOT DETECTED NOT DETECTED Final   Staphylococcus species DETECTED (A) NOT DETECTED Final    Comment: CRITICAL RESULT CALLED TO, READ BACK BY AND VERIFIED WITH: PHARMD MELISSA JAMES ON 08/07/23 @ 1709 BY DRT    Staphylococcus aureus (BCID) NOT DETECTED NOT DETECTED Final   Staphylococcus epidermidis DETECTED (A) NOT DETECTED Final    Comment: Methicillin (oxacillin) resistant coagulase negative staphylococcus. Possible blood culture contaminant (unless  isolated from  more than one blood culture draw or clinical case suggests pathogenicity). No antibiotic treatment is indicated for blood  culture contaminants. CRITICAL RESULT CALLED TO, READ BACK BY AND VERIFIED WITH: PHARMD MELISSA JAMES ON 08/07/23 @ 1709 BY DRT    Staphylococcus lugdunensis DETECTED (A) NOT DETECTED Final    Comment: Methicillin (oxacillin) resistant coagulase negative staphylococcus. Possible blood culture contaminant (unless isolated from more than one blood culture draw or clinical case suggests pathogenicity). No antibiotic treatment is indicated for blood  culture contaminants. CRITICAL RESULT CALLED TO, READ BACK BY AND VERIFIED WITH: PHARMD MELISSA JAMES ON 08/07/23 @ 1709 BY DRT    Streptococcus species DETECTED (A) NOT DETECTED Final    Comment: Not Enterococcus species, Streptococcus agalactiae, Streptococcus pyogenes, or Streptococcus pneumoniae. CRITICAL RESULT CALLED TO, READ BACK BY AND VERIFIED WITH: PHARMD MELISSA JAMES ON 08/07/23 @ 1709 BY DRT    Streptococcus agalactiae NOT DETECTED NOT DETECTED Final   Streptococcus pneumoniae NOT DETECTED NOT DETECTED Final   Streptococcus pyogenes NOT DETECTED NOT DETECTED Final   A.calcoaceticus-baumannii NOT DETECTED NOT DETECTED Final   Bacteroides fragilis NOT DETECTED NOT DETECTED Final   Enterobacterales NOT DETECTED NOT DETECTED Final   Enterobacter cloacae complex NOT DETECTED NOT DETECTED Final   Escherichia coli NOT DETECTED NOT DETECTED Final   Klebsiella aerogenes NOT DETECTED NOT DETECTED Final   Klebsiella oxytoca NOT DETECTED NOT DETECTED Final   Klebsiella pneumoniae NOT DETECTED NOT DETECTED Final   Proteus species NOT DETECTED NOT DETECTED Final   Salmonella species NOT DETECTED NOT DETECTED Final   Serratia marcescens NOT DETECTED NOT DETECTED Final   Haemophilus influenzae NOT DETECTED NOT DETECTED Final   Neisseria meningitidis NOT DETECTED NOT DETECTED Final   Pseudomonas aeruginosa NOT  DETECTED NOT DETECTED Final   Stenotrophomonas maltophilia NOT DETECTED NOT DETECTED Final   Candida albicans NOT DETECTED NOT DETECTED Final   Candida auris NOT DETECTED NOT DETECTED Final   Candida glabrata NOT DETECTED NOT DETECTED Final   Candida krusei NOT DETECTED NOT DETECTED Final   Candida parapsilosis NOT DETECTED NOT DETECTED Final   Candida tropicalis NOT DETECTED NOT DETECTED Final   Cryptococcus neoformans/gattii NOT DETECTED NOT DETECTED Final   Methicillin resistance mecA/C DETECTED (A) NOT DETECTED Final    Comment: CRITICAL RESULT CALLED TO, READ BACK BY AND VERIFIED WITH: PHARMD MELISSA JAMES ON 08/07/23 @ 1709 BY DRT Performed at [facility], [address], [place], [place] [address]   Blood culture (routine x 2)     Status: Abnormal   Collection Time: 08/06/23  6:15 PM   Specimen: BLOOD  Result Value Ref Range Status   Specimen Description   Final    BLOOD BLOOD RIGHT WRIST Performed at [facility], [address] [place], [place] [address]    Special Requests   Final    BOTTLES DRAWN AEROBIC AND ANAEROBIC Blood Culture results may not be optimal due to an inadequate volume of blood received in culture bottles Performed at [facility], [address], [place], [place] [address]    Culture  Setup Time   Final    GRAM POSITIVE COCCI IN CLUSTERS ANAEROBIC BOTTLE ONLY CRITICAL VALUE NOTED.  VALUE IS CONSISTENT WITH PREVIOUSLY REPORTED AND CALLED VALUE. Performed at [facility], [address], [place], [place] [address]    Culture STAPHYLOCOCCUS EPIDERMIDIS (A)  Final   Report Status 08/09/2023 FINAL  Final   Organism ID, Bacteria STAPHYLOCOCCUS EPIDERMIDIS  Final  Susceptibility   Staphylococcus epidermidis - MIC*    CIPROFLOXACIN <=0.5 SENSITIVE Sensitive     ERYTHROMYCIN <=0.25 SENSITIVE Sensitive     GENTAMICIN <=0.5 SENSITIVE Sensitive     OXACILLIN <=0.25 SENSITIVE Sensitive     TETRACYCLINE  <=1 SENSITIVE Sensitive     VANCOMYCIN 1 SENSITIVE Sensitive     TRIMETH/SULFA <=10 SENSITIVE Sensitive     CLINDAMYCIN <=0.25 SENSITIVE Sensitive     RIFAMPIN <=0.5 SENSITIVE Sensitive     Inducible Clindamycin NEGATIVE Sensitive     * STAPHYLOCOCCUS EPIDERMIDIS  Resp panel by RT-PCR (RSV, Flu A&B, Covid) Anterior Nasal Swab     Status: None   Collection Time: 08/07/23 12:51 AM   Specimen: Anterior Nasal Swab  Result Value Ref Range Status   SARS Coronavirus 2 by RT PCR NEGATIVE NEGATIVE Final    Comment: (NOTE) SARS-CoV-2 target nucleic acids are NOT DETECTED.  The SARS-CoV-2 RNA is generally detectable in upper respiratory specimens during the acute phase of infection. The lowest concentration of SARS-CoV-2 viral copies this assay can detect is 138 copies/mL. A negative result does not preclude SARS-Cov-2 infection and should not be used as the sole basis for treatment or other patient management decisions. A negative result may occur with  improper specimen collection/handling, submission of specimen other than nasopharyngeal swab, presence of viral mutation(s) within the areas targeted by this assay, and inadequate number of viral copies(<138 copies/mL). A negative result must be combined with clinical observations, patient history, and epidemiological information. The expected result is Negative.  Fact Sheet for Patients:  BloggerCourse.com  Fact Sheet for Healthcare Providers:  SeriousBroker.it  This test is no t yet approved or cleared by the [place] FDA and  has been authorized for detection and/or diagnosis of SARS-CoV-2 by FDA under an Emergency Use Authorization (EUA). This EUA will remain  in effect (meaning this test can be used) for the duration of the COVID-19 declaration under Section 564(b)(1) of the Act, 21 U.S.C.section 360bbb-3(b)(1), unless the authorization is terminated  or revoked sooner.        Influenza A by PCR NEGATIVE NEGATIVE Final   Influenza B by PCR NEGATIVE NEGATIVE Final    Comment: (NOTE) The Xpert Xpress SARS-CoV-2/FLU/RSV plus assay is intended as an aid in the diagnosis of influenza from Nasopharyngeal swab specimens and should not be used as a sole basis for treatment. Nasal washings and aspirates are unacceptable for Xpert Xpress SARS-CoV-2/FLU/RSV testing.  Fact Sheet for Patients: BloggerCourse.com  Fact Sheet for Healthcare Providers: SeriousBroker.it  This test is not yet approved or cleared by the [place] FDA and has been authorized for detection and/or diagnosis of SARS-CoV-2 by FDA under an Emergency Use Authorization (EUA). This EUA will remain in effect (meaning this test can be used) for the duration of the COVID-19 declaration under Section 564(b)(1) of the Act, 21 U.S.C. section 360bbb-3(b)(1), unless the authorization is terminated or revoked.     Resp Syncytial Virus by PCR NEGATIVE NEGATIVE Final    Comment: (NOTE) Fact Sheet for Patients: BloggerCourse.com  Fact Sheet for Healthcare Providers: SeriousBroker.it  This test is not yet approved or cleared by the [place] FDA and has been authorized for detection and/or diagnosis of SARS-CoV-2 by FDA under an Emergency Use Authorization (EUA). This EUA will remain in effect (meaning this test can be used) for the duration of the COVID-19 declaration under Section 564(b)(1) of the Act, 21 U.S.C. section 360bbb-3(b)(1), unless the authorization is terminated or revoked.  Performed  at [facility], [address], [place], [place] [address]   Expectorated Sputum Assessment w Gram Stain, Rflx to Resp Cult     Status: None   Collection Time: 08/07/23  4:39 AM   Specimen: Expectorated Sputum  Result Value Ref Range Status   Specimen Description EXPECTORATED SPUTUM   Final   Special Requests NONE  Final   Sputum evaluation   Final    Sputum specimen not acceptable for testing.  Please recollect.   NOTIFIED Janace Hoard RN AT [phone] ON 08/07/2023 BY MECIAL J. Performed at [facility], [address], [place], [place] [address]    Report Status 08/07/2023 FINAL  Final  Culture, blood (Routine X 2) w Reflex to ID Panel     Status: None   Collection Time: 08/08/23  9:29 AM   Specimen: BLOOD  Result Value Ref Range Status   Specimen Description   Final    BLOOD SITE NOT SPECIFIED Performed at [facility], [address], [place], [place] [address]    Special Requests   Final    BOTTLES DRAWN AEROBIC AND ANAEROBIC Blood Culture results may not be optimal due to an inadequate volume of blood received in culture bottles Performed at [facility], [address] [place], [place] [address]    Culture   Final    NO GROWTH 5 DAYS Performed at [facility], [address] [place], [place] [address]    Report Status 08/13/2023 FINAL  Final  Culture, blood (Routine X 2) w Reflex to ID Panel     Status: None   Collection Time: 08/08/23  9:41 AM   Specimen: BLOOD  Result Value Ref Range Status   Specimen Description   Final    BLOOD SITE NOT SPECIFIED Performed at [facility], [address], [place], [place] [address]    Special Requests   Final    BOTTLES DRAWN AEROBIC AND ANAEROBIC Blood Culture results may not be optimal due to an inadequate volume of blood received in culture bottles Performed at [facility], [address] [place], [place] [address]    Culture   Final    NO GROWTH 5 DAYS Performed at [facility], [address], [place], [place] [address]    Report Status 08/13/2023 FINAL  Final    Radiology Studies: No results found.    Geraldine Tesar T. Tabias Swayze [facility]  If 7PM-7AM, please contact  night-coverage [email or web address] 08/15/2023, 1:00 PM

## 2023-08-16 ENCOUNTER — Other Ambulatory Visit (HOSPITAL_COMMUNITY): Payer: Self-pay

## 2023-08-16 DIAGNOSIS — J441 Chronic obstructive pulmonary disease with (acute) exacerbation: Secondary | ICD-10-CM

## 2023-08-16 DIAGNOSIS — R7881 Bacteremia: Secondary | ICD-10-CM

## 2023-08-16 DIAGNOSIS — Z765 Malingerer [conscious simulation]: Secondary | ICD-10-CM

## 2023-08-16 DIAGNOSIS — R0602 Shortness of breath: Secondary | ICD-10-CM

## 2023-08-16 DIAGNOSIS — F1990 Other psychoactive substance use, unspecified, uncomplicated: Secondary | ICD-10-CM

## 2023-08-16 DIAGNOSIS — F319 Bipolar disorder, unspecified: Secondary | ICD-10-CM

## 2023-08-16 DIAGNOSIS — J189 Pneumonia, unspecified organism: Principal | ICD-10-CM

## 2023-08-16 DIAGNOSIS — S52502D Unspecified fracture of the lower end of left radius, subsequent encounter for closed fracture with routine healing: Secondary | ICD-10-CM

## 2023-08-16 LAB — RENAL FUNCTION PANEL
Albumin: 3.3 g/dL — ABNORMAL LOW (ref 3.5–5.0)
Anion gap: 9 (ref 5–15)
BUN: 23 mg/dL — ABNORMAL HIGH (ref 6–20)
CO2: 28 mmol/L (ref 22–32)
Calcium: 8.5 mg/dL — ABNORMAL LOW (ref 8.9–10.3)
Chloride: 97 mmol/L — ABNORMAL LOW (ref 98–111)
Creatinine, Ser: 0.51 mg/dL (ref 0.44–1.00)
GFR, Estimated: >60 mL/min (ref >60)
Glucose, Bld: 98 mg/dL (ref 70–99)
Phosphorus: 4.5 mg/dL (ref 2.5–4.6)
Potassium: 4.1 mmol/L (ref 3.5–5.1)
Sodium: 134 mmol/L — ABNORMAL LOW (ref 135–145)

## 2023-08-16 LAB — CBC
HCT: 38.5 % (ref 36.0–46.0)
Hemoglobin: 12.8 g/dL (ref 12.0–15.0)
MCH: 33.5 pg (ref 26.0–34.0)
MCHC: 33.2 g/dL (ref 30.0–36.0)
MCV: 100.8 fL — ABNORMAL HIGH (ref 80.0–100.0)
Platelets: 293 10*3/uL (ref 150–400)
RBC: 3.82 MIL/uL — ABNORMAL LOW (ref 3.87–5.11)
RDW: 13.7 % (ref 11.5–15.5)
WBC: 14.8 10*3/uL — ABNORMAL HIGH (ref 4.0–10.5)
nRBC: 0 % (ref 0.0–0.2)

## 2023-08-16 LAB — MAGNESIUM: Magnesium: 2.1 mg/dL (ref 1.7–2.4)

## 2023-08-16 LAB — T4, FREE: Free T4: 0.59 ng/dL — ABNORMAL LOW (ref 0.61–1.12)

## 2023-08-16 MED ORDER — ORITAVANCIN DIPHOSPHATE 400 MG IV SOLR
1200.0000 mg | Freq: Once | INTRAVENOUS | Status: AC
  Administered 2023-08-16: 1200 mg via INTRAVENOUS
  Filled 2023-08-16: qty 120

## 2023-08-16 NOTE — Plan of Care (Signed)
Problem: Health Behavior/Discharge Planning: Goal: Ability to manage health-related needs will improve Outcome: Progressing   Problem: Clinical Measurements: Goal: Ability to maintain clinical measurements within normal limits will improve Outcome: Progressing   Problem: Activity: Goal: Risk for activity intolerance will decrease Outcome: Progressing   Problem: Coping: Goal: Level of anxiety will decrease Outcome: Progressing   Problem: Pain Management: Goal: General experience of comfort will improve Outcome: Progressing   Problem: Safety: Goal: Ability to remain free from injury will improve Outcome: Progressing   Haydee Salter, RN 08/16/23 3:42 PM

## 2023-08-16 NOTE — Progress Notes (Signed)
Subjective:  Today she is complaining of a toenail fungus that she claims she picked up while in the ER at [facility] Antibiotics:  Anti-infectives (From admission, onward)    Start     Dose/Rate Route Frequency Ordered Stop   08/16/23 1300  Oritavancin Diphosphate (ORBACTIV) 1,200 mg in dextrose 5 % IVPB        1,200 mg 333.3 mL/hr over 180 Minutes Intravenous Once 08/16/23 1201     08/12/23 2200  ceFAZolin (ANCEF) IVPB 2g/100 mL premix  Status:  Discontinued        2 g 200 mL/hr over 30 Minutes Intravenous Every 8 hours 08/12/23 0950 08/12/23 0951   08/12/23 1400  ceFAZolin (ANCEF) IVPB 2g/100 mL premix  Status:  Discontinued        2 g 200 mL/hr over 30 Minutes Intravenous Every 8 hours 08/12/23 0951 08/16/23 1201   08/08/23 1800  vancomycin (VANCOCIN) IVPB 1000 mg/200 mL premix  Status:  Discontinued        1,000 mg 200 mL/hr over 60 Minutes Intravenous Every 24 hours 08/07/23 1741 08/12/23 0950   08/07/23 1800  cefTRIAXone (ROCEPHIN) 2 g in sodium chloride 0.9 % 100 mL IVPB        2 g 200 mL/hr over 30 Minutes Intravenous Every 24 hours 08/06/23 1936 08/11/23 1913   08/07/23 1800  azithromycin (ZITHROMAX) 500 mg in sodium chloride 0.9 % 250 mL IVPB  Status:  Discontinued        500 mg 250 mL/hr over 60 Minutes Intravenous Every 24 hours 08/06/23 1936 08/07/23 1142   08/07/23 1800  azithromycin (ZITHROMAX) tablet 500 mg  Status:  Discontinued        500 mg Oral Every 24 hours 08/07/23 1142 08/09/23 0916   08/07/23 1800  vancomycin (VANCOCIN) IVPB 1000 mg/200 mL premix        1,000 mg 200 mL/hr over 60 Minutes Intravenous  Once 08/07/23 1731 08/07/23 1905   08/06/23 1800  cefTRIAXone (ROCEPHIN) 1 g in sodium chloride 0.9 % 100 mL IVPB        1 g 200 mL/hr over 30 Minutes Intravenous  Once 08/06/23 1747 08/06/23 1928   08/06/23 1800  azithromycin (ZITHROMAX) 500 mg in sodium chloride 0.9 % 250 mL IVPB        500 mg 250 mL/hr over 60 Minutes Intravenous  Once  08/06/23 1747 08/06/23 1928       Medications: Scheduled Meds:  acetaminophen  650 mg Oral Q6H WA   enoxaparin (LOVENOX) injection  40 mg Subcutaneous Q24H   feeding supplement  237 mL Oral TID BM   fluticasone  1 spray Each Nare Daily   folic acid  1 mg Oral Daily   guaiFENesin  600 mg Oral BID   hydrocortisone   Rectal BID   loratadine  10 mg Oral Daily   mometasone-formoterol  2 puff Inhalation BID   multivitamin with minerals  1 tablet Oral Daily   nicotine  21 mg Transdermal Daily   pantoprazole  40 mg Oral Daily   thiamine  100 mg Oral Daily   Or   thiamine  100 mg Intravenous Daily   umeclidinium bromide  1 puff Inhalation Daily   Continuous Infusions:  Oritavancin Diphosphate (ORBACTIV) 1,200 mg in dextrose 5 % IVPB 1,200 mg (08/16/23 1444)   PRN Meds:.alum & mag hydroxide-simeth, diphenhydrAMINE, ipratropium-albuterol, LORazepam **OR** LORazepam, ondansetron **OR** ondansetron (ZOFRAN) IV, oxyCODONE, senna-docusate, sodium chloride, traMADol, traZODone  Objective: Weight change:   Intake/Output Summary (Last 24 hours) at 08/16/2023 1727 Last data filed at 08/16/2023 1400 Gross per 24 hour  Intake 1620 ml  Output 0 ml  Net 1620 ml   Blood pressure 115/81, pulse 89, temperature 98 F (36.7 C), resp. rate 18, height 5\' 5"  (1.651 m), weight 49.1 kg, last menstrual period 10/13/2011, SpO2 96%. Temp:  [98 F (36.7 C)-98.4 F (36.9 C)] 98 F (36.7 C) (12/20 1303) Pulse Rate:  [89-104] 89 (12/20 1303) Resp:  [16-18] 18 (12/20 1303) BP: (113-130)/(57-81) 115/81 (12/20 1303) SpO2:  [93 %-96 %] 96 % (12/20 1303)  Physical Exam: Physical Exam Constitutional:      General: She is not in acute distress.    Appearance: She is cachectic. She is not diaphoretic.  HENT:     Head: Normocephalic and atraumatic.     Right Ear: External ear normal.     Left Ear: External ear normal.     Mouth/Throat:     Pharynx: No oropharyngeal exudate.  Eyes:     General: No  scleral icterus.    Conjunctiva/sclera: Conjunctivae normal.     Pupils: Pupils are equal, round, and reactive to light.  Cardiovascular:     Rate and Rhythm: Normal rate and regular rhythm.     Heart sounds: Normal heart sounds. No murmur heard.    No friction rub. No gallop.  Pulmonary:     Effort: Pulmonary effort is normal. No respiratory distress.     Breath sounds: Normal breath sounds. No wheezing or rales.  Abdominal:     General: Bowel sounds are normal. There is no distension.     Palpations: Abdomen is soft.     Tenderness: There is no abdominal tenderness. There is no rebound.  Musculoskeletal:        General: No tenderness. Normal range of motion.  Lymphadenopathy:     Cervical: No cervical adenopathy.  Skin:    General: Skin is warm and dry.     Coloration: Skin is not pale.     Findings: No erythema or rash.  Neurological:     General: No focal deficit present.     Mental Status: She is alert and oriented to person, place, and time.     Motor: No abnormal muscle tone.  Psychiatric:        Attention and Perception: Attention normal.        Mood and Affect: Mood is anxious.        Behavior: Behavior normal.        Thought Content: Thought content normal.        Judgment: Judgment normal.     Left arm wrapped in dressing   CBC:    BMET Recent Labs    08/15/23 0404 08/16/23 0338  NA 135 134*  K 4.1 4.1  CL 97* 97*  CO2 30 28  GLUCOSE 92 98  BUN 24* 23*  CREATININE 0.47 0.51  CALCIUM 8.7* 8.5*     Liver Panel  Recent Labs    08/14/23 0401 08/16/23 0338  ALBUMIN 3.4* 3.3*       Sedimentation Rate No results for input(s): "ESRSEDRATE" in the last 72 hours. C-Reactive Protein No results for input(s): "CRP" in the last 72 hours.  Micro Results: Recent Results (from the past 720 hours)  Urine Culture     Status: Abnormal   Collection Time: 08/01/23  9:12 PM   Specimen: Urine, Clean Catch  Result Value Ref Range Status  Specimen  Description URINE, CLEAN CATCH  Final   Special Requests   Final    NONE Performed at [facility], [address], [place], [place] [address]    Culture >=100,000 COLONIES/mL ESCHERICHIA COLI (A)  Final   Report Status 08/04/2023 FINAL  Final   Organism ID, Bacteria ESCHERICHIA COLI (A)  Final      Susceptibility   Escherichia coli - MIC*    AMPICILLIN 4 SENSITIVE Sensitive     CEFEPIME <=0.12 SENSITIVE Sensitive     CEFTRIAXONE <=0.25 SENSITIVE Sensitive     CIPROFLOXACIN <=0.25 SENSITIVE Sensitive     GENTAMICIN <=1 SENSITIVE Sensitive     IMIPENEM <=0.25 SENSITIVE Sensitive     TRIMETH/SULFA <=20 SENSITIVE Sensitive     AMPICILLIN/SULBACTAM <=2 SENSITIVE Sensitive     PIP/TAZO <=4 SENSITIVE Sensitive ug/mL    * >=100,000 COLONIES/mL ESCHERICHIA COLI  Blood culture (routine x 2)     Status: Abnormal   Collection Time: 08/06/23  6:05 PM   Specimen: BLOOD RIGHT FOREARM  Result Value Ref Range Status   Specimen Description   Final    BLOOD RIGHT FOREARM Performed at [facility], [address] [place], [place] [address]    Special Requests   Final    BOTTLES DRAWN AEROBIC AND ANAEROBIC Blood Culture results may not be optimal due to an inadequate volume of blood received in culture bottles Performed at [facility], [address], [place], [place] [address]    Culture  Setup Time   Final    GRAM POSITIVE COCCI ANAEROBIC BOTTLE ONLY CRITICAL RESULT CALLED TO, READ BACK BY AND VERIFIED WITH: PHARMD MELISSA JAMES ON 08/07/23 @ 1709 BY DRT Performed at [facility], [address] [place], [place] [address]    Culture (A)  Final    STAPHYLOCOCCUS EPIDERMIDIS Two isolates with different morphologies were identified as the same organism.The most resistant organism was reported. STAPHYLOCOCCUS LUGDUNENSIS    Report Status 08/11/2023 FINAL  Final   Organism ID, Bacteria STAPHYLOCOCCUS EPIDERMIDIS  Final   Organism ID, Bacteria  STAPHYLOCOCCUS LUGDUNENSIS  Final      Susceptibility   Staphylococcus epidermidis - MIC*    CIPROFLOXACIN <=0.5 SENSITIVE Sensitive     ERYTHROMYCIN <=0.25 SENSITIVE Sensitive     GENTAMICIN <=0.5 SENSITIVE Sensitive     OXACILLIN <=0.25 SENSITIVE Sensitive     TETRACYCLINE >=16 RESISTANT Resistant     VANCOMYCIN 2 SENSITIVE Sensitive     TRIMETH/SULFA <=10 SENSITIVE Sensitive     CLINDAMYCIN <=0.25 SENSITIVE Sensitive     RIFAMPIN <=0.5 SENSITIVE Sensitive     Inducible Clindamycin NEGATIVE Sensitive     * STAPHYLOCOCCUS EPIDERMIDIS   Staphylococcus lugdunensis - MIC*    CIPROFLOXACIN <=0.5 SENSITIVE Sensitive     ERYTHROMYCIN <=0.25 SENSITIVE Sensitive     GENTAMICIN <=0.5 SENSITIVE Sensitive     OXACILLIN 2 SENSITIVE Sensitive     TETRACYCLINE <=1 SENSITIVE Sensitive     VANCOMYCIN <=0.5 SENSITIVE Sensitive     TRIMETH/SULFA <=10 SENSITIVE Sensitive     CLINDAMYCIN <=0.25 SENSITIVE Sensitive     RIFAMPIN <=0.5 SENSITIVE Sensitive     Inducible Clindamycin NEGATIVE Sensitive     * STAPHYLOCOCCUS LUGDUNENSIS  Blood Culture ID Panel (Reflexed)     Status: Abnormal   Collection Time: 08/06/23  6:05 PM  Result Value Ref Range Status   Enterococcus faecalis NOT DETECTED NOT DETECTED Final   Enterococcus Faecium NOT DETECTED NOT DETECTED  Final   Listeria monocytogenes NOT DETECTED NOT DETECTED Final   Staphylococcus species DETECTED (A) NOT DETECTED Final    Comment: CRITICAL RESULT CALLED TO, READ BACK BY AND VERIFIED WITH: PHARMD MELISSA JAMES ON 08/07/23 @ 1709 BY DRT    Staphylococcus aureus (BCID) NOT DETECTED NOT DETECTED Final   Staphylococcus epidermidis DETECTED (A) NOT DETECTED Final    Comment: Methicillin (oxacillin) resistant coagulase negative staphylococcus. Possible blood culture contaminant (unless isolated from more than one blood culture draw or clinical case suggests pathogenicity). No antibiotic treatment is indicated for blood  culture contaminants. CRITICAL  RESULT CALLED TO, READ BACK BY AND VERIFIED WITH: PHARMD MELISSA JAMES ON 08/07/23 @ 1709 BY DRT    Staphylococcus lugdunensis DETECTED (A) NOT DETECTED Final    Comment: Methicillin (oxacillin) resistant coagulase negative staphylococcus. Possible blood culture contaminant (unless isolated from more than one blood culture draw or clinical case suggests pathogenicity). No antibiotic treatment is indicated for blood  culture contaminants. CRITICAL RESULT CALLED TO, READ BACK BY AND VERIFIED WITH: PHARMD MELISSA JAMES ON 08/07/23 @ 1709 BY DRT    Streptococcus species DETECTED (A) NOT DETECTED Final    Comment: Not Enterococcus species, Streptococcus agalactiae, Streptococcus pyogenes, or Streptococcus pneumoniae. CRITICAL RESULT CALLED TO, READ BACK BY AND VERIFIED WITH: PHARMD MELISSA JAMES ON 08/07/23 @ 1709 BY DRT    Streptococcus agalactiae NOT DETECTED NOT DETECTED Final   Streptococcus pneumoniae NOT DETECTED NOT DETECTED Final   Streptococcus pyogenes NOT DETECTED NOT DETECTED Final   A.calcoaceticus-baumannii NOT DETECTED NOT DETECTED Final   Bacteroides fragilis NOT DETECTED NOT DETECTED Final   Enterobacterales NOT DETECTED NOT DETECTED Final   Enterobacter cloacae complex NOT DETECTED NOT DETECTED Final   Escherichia coli NOT DETECTED NOT DETECTED Final   Klebsiella aerogenes NOT DETECTED NOT DETECTED Final   Klebsiella oxytoca NOT DETECTED NOT DETECTED Final   Klebsiella pneumoniae NOT DETECTED NOT DETECTED Final   Proteus species NOT DETECTED NOT DETECTED Final   Salmonella species NOT DETECTED NOT DETECTED Final   Serratia marcescens NOT DETECTED NOT DETECTED Final   Haemophilus influenzae NOT DETECTED NOT DETECTED Final   Neisseria meningitidis NOT DETECTED NOT DETECTED Final   Pseudomonas aeruginosa NOT DETECTED NOT DETECTED Final   Stenotrophomonas maltophilia NOT DETECTED NOT DETECTED Final   Candida albicans NOT DETECTED NOT DETECTED Final   Candida auris NOT  DETECTED NOT DETECTED Final   Candida glabrata NOT DETECTED NOT DETECTED Final   Candida krusei NOT DETECTED NOT DETECTED Final   Candida parapsilosis NOT DETECTED NOT DETECTED Final   Candida tropicalis NOT DETECTED NOT DETECTED Final   Cryptococcus neoformans/gattii NOT DETECTED NOT DETECTED Final   Methicillin resistance mecA/C DETECTED (A) NOT DETECTED Final    Comment: CRITICAL RESULT CALLED TO, READ BACK BY AND VERIFIED WITH: PHARMD MELISSA JAMES ON 08/07/23 @ 1709 BY DRT Performed at [facility], [address] [place], [place] [address]   Blood culture (routine x 2)     Status: Abnormal   Collection Time: 08/06/23  6:15 PM   Specimen: BLOOD  Result Value Ref Range Status   Specimen Description   Final    BLOOD BLOOD RIGHT WRIST Performed at [facility], [address], [place], [place] [address]    Special Requests   Final    BOTTLES DRAWN AEROBIC AND ANAEROBIC Blood Culture results may not be optimal due to an inadequate volume of blood received in culture bottles Performed at [facility],  [address], [place], [place] [address]    Culture  Setup Time   Final    GRAM POSITIVE COCCI IN CLUSTERS ANAEROBIC BOTTLE ONLY CRITICAL VALUE NOTED.  VALUE IS CONSISTENT WITH PREVIOUSLY REPORTED AND CALLED VALUE. Performed at [facility], [address] [place], [place] [address]    Culture STAPHYLOCOCCUS EPIDERMIDIS (A)  Final   Report Status 08/09/2023 FINAL  Final   Organism ID, Bacteria STAPHYLOCOCCUS EPIDERMIDIS  Final      Susceptibility   Staphylococcus epidermidis - MIC*    CIPROFLOXACIN <=0.5 SENSITIVE Sensitive     ERYTHROMYCIN <=0.25 SENSITIVE Sensitive     GENTAMICIN <=0.5 SENSITIVE Sensitive     OXACILLIN <=0.25 SENSITIVE Sensitive     TETRACYCLINE <=1 SENSITIVE Sensitive     VANCOMYCIN 1 SENSITIVE Sensitive     TRIMETH/SULFA <=10 SENSITIVE Sensitive     CLINDAMYCIN <=0.25 SENSITIVE Sensitive     RIFAMPIN  <=0.5 SENSITIVE Sensitive     Inducible Clindamycin NEGATIVE Sensitive     * STAPHYLOCOCCUS EPIDERMIDIS  Resp panel by RT-PCR (RSV, Flu A&B, Covid) Anterior Nasal Swab     Status: None   Collection Time: 08/07/23 12:51 AM   Specimen: Anterior Nasal Swab  Result Value Ref Range Status   SARS Coronavirus 2 by RT PCR NEGATIVE NEGATIVE Final    Comment: (NOTE) SARS-CoV-2 target nucleic acids are NOT DETECTED.  The SARS-CoV-2 RNA is generally detectable in upper respiratory specimens during the acute phase of infection. The lowest concentration of SARS-CoV-2 viral copies this assay can detect is 138 copies/mL. A negative result does not preclude SARS-Cov-2 infection and should not be used as the sole basis for treatment or other patient management decisions. A negative result may occur with  improper specimen collection/handling, submission of specimen other than nasopharyngeal swab, presence of viral mutation(s) within the areas targeted by this assay, and inadequate number of viral copies(<138 copies/mL). A negative result must be combined with clinical observations, patient history, and epidemiological information. The expected result is Negative.  Fact Sheet for Patients:  BloggerCourse.com  Fact Sheet for Healthcare Providers:  SeriousBroker.it  This test is no t yet approved or cleared by the [place] FDA and  has been authorized for detection and/or diagnosis of SARS-CoV-2 by FDA under an Emergency Use Authorization (EUA). This EUA will remain  in effect (meaning this test can be used) for the duration of the COVID-19 declaration under Section 564(b)(1) of the Act, 21 U.S.C.section 360bbb-3(b)(1), unless the authorization is terminated  or revoked sooner.       Influenza A by PCR NEGATIVE NEGATIVE Final   Influenza B by PCR NEGATIVE NEGATIVE Final    Comment: (NOTE) The Xpert Xpress SARS-CoV-2/FLU/RSV plus assay is  intended as an aid in the diagnosis of influenza from Nasopharyngeal swab specimens and should not be used as a sole basis for treatment. Nasal washings and aspirates are unacceptable for Xpert Xpress SARS-CoV-2/FLU/RSV testing.  Fact Sheet for Patients: BloggerCourse.com  Fact Sheet for Healthcare Providers: SeriousBroker.it  This test is not yet approved or cleared by the [place] FDA and has been authorized for detection and/or diagnosis of SARS-CoV-2 by FDA under an Emergency Use Authorization (EUA). This EUA will remain in effect (meaning this test can be used) for the duration of the COVID-19 declaration under Section 564(b)(1) of the Act, 21 U.S.C. section 360bbb-3(b)(1), unless the authorization is terminated or revoked.     Resp Syncytial Virus by PCR NEGATIVE NEGATIVE Final  Comment: (NOTE) Fact Sheet for Patients: BloggerCourse.com  Fact Sheet for Healthcare Providers: SeriousBroker.it  This test is not yet approved or cleared by the [place] FDA and has been authorized for detection and/or diagnosis of SARS-CoV-2 by FDA under an Emergency Use Authorization (EUA). This EUA will remain in effect (meaning this test can be used) for the duration of the COVID-19 declaration under Section 564(b)(1) of the Act, 21 U.S.C. section 360bbb-3(b)(1), unless the authorization is terminated or revoked.  Performed at [facility], [address], [place], [place] [address]   Expectorated Sputum Assessment w Gram Stain, Rflx to Resp Cult     Status: None   Collection Time: 08/07/23  4:39 AM   Specimen: Expectorated Sputum  Result Value Ref Range Status   Specimen Description EXPECTORATED SPUTUM  Final   Special Requests NONE  Final   Sputum evaluation   Final    Sputum specimen not acceptable for testing.  Please recollect.   NOTIFIED Janace Hoard RN  AT [phone] ON 08/07/2023 BY MECIAL J. Performed at [facility], [address], [place], [place] [address]    Report Status 08/07/2023 FINAL  Final  Culture, blood (Routine X 2) w Reflex to ID Panel     Status: None   Collection Time: 08/08/23  9:29 AM   Specimen: BLOOD  Result Value Ref Range Status   Specimen Description   Final    BLOOD SITE NOT SPECIFIED Performed at [facility], [address] [place], [place] [address]    Special Requests   Final    BOTTLES DRAWN AEROBIC AND ANAEROBIC Blood Culture results may not be optimal due to an inadequate volume of blood received in culture bottles Performed at [facility], [address], [place], [place] [address]    Culture   Final    NO GROWTH 5 DAYS Performed at [facility], [address] [place], [place] [address]    Report Status 08/13/2023 FINAL  Final  Culture, blood (Routine X 2) w Reflex to ID Panel     Status: None   Collection Time: 08/08/23  9:41 AM   Specimen: BLOOD  Result Value Ref Range Status   Specimen Description   Final    BLOOD SITE NOT SPECIFIED Performed at [facility], [address], [place], [place] [address]    Special Requests   Final    BOTTLES DRAWN AEROBIC AND ANAEROBIC Blood Culture results may not be optimal due to an inadequate volume of blood received in culture bottles Performed at [facility], [address] [place], [place] [address]    Culture   Final    NO GROWTH 5 DAYS Performed at [facility], [address] [place], [place] [address]    Report Status 08/13/2023 FINAL  Final    Studies/Results: No results found.    Assessment/Plan:  INTERVAL HISTORY: case discussed with Cardiology  Principal Problem:   Community acquired pneumonia of right middle lobe of lung Active Problems:   Bipolar disorder (HCC)   COPD (chronic obstructive pulmonary disease) (HCC)    Substance use disorder   Closed fracture of distal end of left radius   Bacteremia   Shortness of breath   Vaginal prolapse   Homelessness    Stacy Bolton is a 52 y.o. female with  polysubstance abuse (BUT NO IVDU she is adamant about this) seizures homeless nests who sustained a  fracture of her left wrist who was admitted and found to have right middle lobe pneumonia.  Blood cultures taken on admission as well.  The [facility] ID noted up to coccal species Staphylococcus lugdunensis and Staphylococcus epidermidis.  Phenotypically 2 different Staphylococcus epidermidis species grew in the 2 different sites that were cultured which are discordant and consistent with contamination staph lugdunensis did grow in 1 of 2 sites.  #1 Bacteremia:  Narrowed to ancef  TTE did not show endocarditis though aortic valve not optimally visualized  Cardiology reviewed the 2D echo though and there was only 1 view that was limited due to her COPD.  In all the other views the aortic valve did not have any evidence of endocarditis   Now that she has received a week of parenteral therapy we will dose her with a rate of [place] which would last for more than a week and will relieve her of the need to take pills or be on IV antibiotics   #2 Polysubstance abuse, opiates, (which she denied) benzos and cocaine  #3 Homelessness: hopefully a stable solution for her can be found  #4 Vaginal prolapse: she perseverates about this issue which is not really in my "wheelhouse to address"   #5 Fracture of radius: surgery delayed  #6 Knee pain and hip pain: exam is benign  #7 questionable onychomycosis:  I am very skeptical that she developed this from having been in the ER this is something that develops over time currently difficult to assess based on her painted toenails.  I would not hazard giving her systemic Lamisil given her polysubstance abuse but would use topical therapy if so desired.  #8 ?  Secondary gain: Given her homelessness and chaotic life outside the hospital staying here as long as possible is undoubtedly something she might wish for  I will sign off for now please call with further questions     LOS: 10 days   Acey Lav 08/16/2023, 5:27 PM

## 2023-08-16 NOTE — Plan of Care (Signed)
  Problem: Coping: Goal: Level of anxiety will decrease Outcome: Progressing   Problem: Pain Management: Goal: General experience of comfort will improve Outcome: Progressing   Problem: Respiratory: Goal: Ability to maintain a clear airway will improve Outcome: Progressing

## 2023-08-16 NOTE — Progress Notes (Signed)
PROGRESS NOTE  Stacy Bolton:[id number] DOB: 1970/12/01   PCP: Patient, No Pcp Per  Patient is from: [facility] shelter   DOA: 08/06/2023 LOS: 10  Chief complaints Chief Complaint  Patient presents with   Shortness of Breath     Brief Narrative / Interim history: 52 year old F with PMH of COPD, anxiety, bipolar disorder, polysubstance use including EtOH, cocaine, THC and tobacco, homelessness and recent left arm fracture presenting with shortness of breath, productive cough with yellowish phlegm, fever, chills and diaphoresis and admitted with community-acquired pneumonia and COPD exacerbation.  Checks x-ray showed RML infiltrate.  Patient smokes about a pack a day.  She was started on ceftriaxone, Zithromax, steroid and breathing treatments and admitted.  Blood culture with Staph epidermidis with different sensitivity patterns, Staph lugdunensis in 1 out of 2 bottles and Streptococcus species. Vancomycin added.  Repeat blood culture NGTD.  TTE without significant finding.  TEE deferred.  Antibiotics de-escalated to Ancef.  ID following.  Subjective: Seen and examined earlier this morning.  No major events overnight of this morning.  No new complaints.  Requesting albuterol inhaler prescription on discharge.   Objective: Vitals:   08/16/23 0647 08/16/23 0758 08/16/23 1123 08/16/23 1303  BP: 130/70   115/81  Pulse: 92   89  Resp: 16   18  Temp: 98 F (36.7 C)   98 F (36.7 C)  TempSrc: Oral     SpO2: 93% 93% 96% 96%  Weight:      Height:        Examination:  GENERAL: No apparent distress.  Nontoxic. HEENT: MMM.  Vision and hearing grossly intact.  NECK: Supple.  No apparent JVD.  RESP:  No IWOB.  Fair aeration bilaterally.  CVS:  RRR. Heart sounds normal.  ABD/GI/GU: BS+. Abd soft, NTND.  MSK/EXT:  Moves extremities.  Left forearm in splint. SKIN: no apparent skin lesion or wound NEURO: Awake, alert and oriented appropriately.  No  apparent focal neuro deficit. PSYCH: Appears anxious.  Procedures:  None  Microbiology summarized: COVID-19, influenza and RSV PCR nonreactive Sputum sample not acceptable for culture. Blood culture with Staph epidermidis with 2 different sensitive patterns, lugdunensis in 1 out of 2 bottles and Streptococcus species Repeat blood culture NGTD.  Assessment and plan: Polymicrobial bacteremia?  Contaminated blood sample? Blood culture with Staph epidermidis with different sensitivity patterns, lugdunensis in 1 out of 2 bottles and Streptococcus species.  Denies IVDU.  Repeat blood culture NGTD.  Hepatitis panel negative.  Pro-Cal negative.  TTE without vegetation.  TEE deferred.  -Antibiotic de-escalated to IV Ancef -ID following.  Community-acquired pneumonia of the right middle lobe: CXR with RML infiltrate.  Reportedly hypoxic to 80s on RA prior to arrival to ED. Still with productive cough, rhonchi and chest congestion.  Smokes about a pack a day.  COVID-19, influenza and RSV PCR nonreactive.  Blood cultures as above.  Strep pneumo and Legionella antigen negative.  Was on vancomycin, ceftriaxone and Zithromax.  Completed antibiotic course for this. -IS, FV, mucolytics   COPD with acute exacerbation: Likely due to the above and smoking.  Improved. -Completed 5 days of steroid -Continue Dulera and Incruse Ellipta -Continue DuoNebs as needed -Antibiotics as above -Encouraged smoking cessation.  Polysubstance substance use disorder: Smokes about a pack a day.  Also admits to using cocaine and drinking alcohol but not quantifying.  Denies IVDU.  UDS positive for opiate, cocaine and benzodiazepine.  Seems to have some withdrawal with nasal congestion,  anxiety... -Encouraged smoking cessation and refraining from cocaine and alcohol use -Ativan as needed -Continue nicotine patch -Continue thiamine, folate, multivitamins -TOC following.   Bipolar disorder/anxiety: Seen by psychiatry on last  admission and recommended discontinuation of Depakote and paroxetine due to medication noncompliance and ongoing substance use.  Appears anxious partly due to steroid. -Now off steroid. -Ativan as above -Trazodone 50 mg nightly as needed   Distal left radial fracture: after fall on 12/2.  Seen by orthopedics Dr. Alena Bills and scheduled for ORIF 12/19. -Pain control with as needed Tylenol, tramadol and oxycodone -Per patient, surgery being delayed due to current infection.  Nasal congestion: Likely due to withdrawal. -Continue Flonase and Claritin  Pruritus/skin itch -P.o. Benadryl as needed.  Avoid IV  Rectal prolapse: Chronic issue. -Avoid constipation -Anusol cream -Outpatient follow-up  Elevated TSH: 17.6 (previously 4.7 about 6 months ago and normal before that).   -Check free T4.  Severe malnutrition: Body mass index is 18.01 kg/m. Nutrition Problem: Severe Malnutrition Etiology: social / environmental circumstances Signs/Symptoms: severe fat depletion, severe muscle depletion, percent weight loss (10% in 2 weeks) Percent weight loss: 10 % (in 2 weeks) Interventions: Refer to RD note for recommendations, Ensure Enlive (each supplement provides 350kcal and 20 grams of protein), Magic cup, MVI   DVT prophylaxis:  enoxaparin (LOVENOX) injection 40 mg Start: 08/06/23 2200  Code Status: Full code Family Communication: None at bedside. Level of care: Med-Surg Status is: Inpatient Remains inpatient appropriate because: Bacteremia.   Final disposition: Homeless shelter Consultants:  Infectious disease  35 minutes with more than 50% spent in reviewing records, counseling patient/family and coordinating care.   Sch Meds:  Scheduled Meds:  acetaminophen  650 mg Oral Q6H WA   enoxaparin (LOVENOX) injection  40 mg Subcutaneous Q24H   feeding supplement  237 mL Oral TID BM   fluticasone  1 spray Each Nare Daily   folic acid  1 mg Oral Daily   guaiFENesin  600 mg Oral  BID   hydrocortisone   Rectal BID   loratadine  10 mg Oral Daily   mometasone-formoterol  2 puff Inhalation BID   multivitamin with minerals  1 tablet Oral Daily   nicotine  21 mg Transdermal Daily   pantoprazole  40 mg Oral Daily   thiamine  100 mg Oral Daily   Or   thiamine  100 mg Intravenous Daily   umeclidinium bromide  1 puff Inhalation Daily   Continuous Infusions:  Oritavancin Diphosphate (ORBACTIV) 1,200 mg in dextrose 5 % IVPB     PRN Meds:.alum & mag hydroxide-simeth, diphenhydrAMINE, ipratropium-albuterol, LORazepam **OR** LORazepam, ondansetron **OR** ondansetron (ZOFRAN) IV, oxyCODONE, senna-docusate, sodium chloride, traMADol, traZODone  Antimicrobials: Anti-infectives (From admission, onward)    Start     Dose/Rate Route Frequency Ordered Stop   08/16/23 1300  Oritavancin Diphosphate (ORBACTIV) 1,200 mg in dextrose 5 % IVPB        1,200 mg 333.3 mL/hr over 180 Minutes Intravenous Once 08/16/23 1201     08/12/23 2200  ceFAZolin (ANCEF) IVPB 2g/100 mL premix  Status:  Discontinued        2 g 200 mL/hr over 30 Minutes Intravenous Every 8 hours 08/12/23 0950 08/12/23 0951   08/12/23 1400  ceFAZolin (ANCEF) IVPB 2g/100 mL premix  Status:  Discontinued        2 g 200 mL/hr over 30 Minutes Intravenous Every 8 hours 08/12/23 0951 08/16/23 1201   08/08/23 1800  vancomycin (VANCOCIN) IVPB 1000 mg/200 mL premix  Status:  Discontinued        1,000 mg 200 mL/hr over 60 Minutes Intravenous Every 24 hours 08/07/23 1741 08/12/23 0950   08/07/23 1800  cefTRIAXone (ROCEPHIN) 2 g in sodium chloride 0.9 % 100 mL IVPB        2 g 200 mL/hr over 30 Minutes Intravenous Every 24 hours 08/06/23 1936 08/11/23 1913   08/07/23 1800  azithromycin (ZITHROMAX) 500 mg in sodium chloride 0.9 % 250 mL IVPB  Status:  Discontinued        500 mg 250 mL/hr over 60 Minutes Intravenous Every 24 hours 08/06/23 1936 08/07/23 1142   08/07/23 1800  azithromycin (ZITHROMAX) tablet 500 mg  Status:   Discontinued        500 mg Oral Every 24 hours 08/07/23 1142 08/09/23 0916   08/07/23 1800  vancomycin (VANCOCIN) IVPB 1000 mg/200 mL premix        1,000 mg 200 mL/hr over 60 Minutes Intravenous  Once 08/07/23 1731 08/07/23 1905   08/06/23 1800  cefTRIAXone (ROCEPHIN) 1 g in sodium chloride 0.9 % 100 mL IVPB        1 g 200 mL/hr over 30 Minutes Intravenous  Once 08/06/23 1747 08/06/23 1928   08/06/23 1800  azithromycin (ZITHROMAX) 500 mg in sodium chloride 0.9 % 250 mL IVPB        500 mg 250 mL/hr over 60 Minutes Intravenous  Once 08/06/23 1747 08/06/23 1928        I have personally reviewed the following labs and images: CBC: Recent Labs  Lab 08/11/23 0327 08/14/23 0401 08/16/23 0338  WBC 13.1* 13.1* 14.8*  HGB 15.1* 13.9 12.8  HCT 47.6* 43.3 38.5  MCV 103.3* 102.9* 100.8*  PLT 333 317 293   BMP &GFR Recent Labs  Lab 08/11/23 0327 08/14/23 0401 08/15/23 0404 08/16/23 0338  NA 137 137 135 134*  K 3.9 4.1 4.1 4.1  CL 100 100 97* 97*  CO2 28 27 30 28   GLUCOSE 119* 78 92 98  BUN 17 26* 24* 23*  CREATININE 0.61 0.45 0.47 0.51  CALCIUM 8.9 8.9 8.7* 8.5*  MG 2.2 2.3  --  2.1  PHOS  --  4.3  --  4.5   Estimated Creatinine Clearance: 62.3 mL/min (by C-G formula based on SCr of 0.51 mg/dL). Liver & Pancreas: Recent Labs  Lab 08/11/23 0327 08/14/23 0401 08/16/23 0338  AST 20  --   --   ALT 21  --   --   ALKPHOS 78  --   --   BILITOT 0.4  --   --   PROT 6.3*  --   --   ALBUMIN 3.5 3.4* 3.3*   No results for input(s): "LIPASE", "AMYLASE" in the last 168 hours.  No results for input(s): "AMMONIA" in the last 168 hours. Diabetic: No results for input(s): "HGBA1C" in the last 72 hours. No results for input(s): "GLUCAP" in the last 168 hours. Cardiac Enzymes: No results for input(s): "CKTOTAL", "CKMB", "CKMBINDEX", "TROPONINI" in the last 168 hours. No results for input(s): "PROBNP" in the last 8760 hours. Coagulation Profile: No results for input(s): "INR",  "PROTIME" in the last 168 hours. Thyroid Function Tests: Recent Labs    08/15/23 0404  TSH 17.644*   Lipid Profile: No results for input(s): "CHOL", "HDL", "LDLCALC", "TRIG", "CHOLHDL", "LDLDIRECT" in the last 72 hours. Anemia Panel: No results for input(s): "VITAMINB12", "FOLATE", "FERRITIN", "TIBC", "IRON", "RETICCTPCT" in the last 72 hours. Urine analysis:    Component  Value Date/Time   COLORURINE YELLOW 08/01/2023 2112   APPEARANCEUR HAZY (A) 08/01/2023 2112   LABSPEC 1.025 08/01/2023 2112   PHURINE 5.0 08/01/2023 2112   GLUCOSEU NEGATIVE 08/01/2023 2112   HGBUR NEGATIVE 08/01/2023 2112   BILIRUBINUR NEGATIVE 08/01/2023 2112   KETONESUR NEGATIVE 08/01/2023 2112   PROTEINUR NEGATIVE 08/01/2023 2112   UROBILINOGEN 1.0 03/19/2010 1431   NITRITE POSITIVE (A) 08/01/2023 2112   LEUKOCYTESUR NEGATIVE 08/01/2023 2112   Sepsis Labs: Invalid input(s): "PROCALCITONIN", "LACTICIDVEN"  Microbiology: Recent Results (from the past 240 hours)  Blood culture (routine x 2)     Status: Abnormal   Collection Time: 08/06/23  6:05 PM   Specimen: BLOOD RIGHT FOREARM  Result Value Ref Range Status   Specimen Description   Final    BLOOD RIGHT FOREARM Performed at [facility], [address], [place], [place] [address]    Special Requests   Final    BOTTLES DRAWN AEROBIC AND ANAEROBIC Blood Culture results may not be optimal due to an inadequate volume of blood received in culture bottles Performed at [facility], [address] [place], [place] [address]    Culture  Setup Time   Final    GRAM POSITIVE COCCI ANAEROBIC BOTTLE ONLY CRITICAL RESULT CALLED TO, READ BACK BY AND VERIFIED WITH: PHARMD MELISSA JAMES ON 08/07/23 @ 1709 BY DRT Performed at [facility], [address], [place], [place] [address]    Culture (A)  Final    STAPHYLOCOCCUS EPIDERMIDIS Two isolates with different morphologies were identified as the same organism.The most resistant  organism was reported. STAPHYLOCOCCUS LUGDUNENSIS    Report Status 08/11/2023 FINAL  Final   Organism ID, Bacteria STAPHYLOCOCCUS EPIDERMIDIS  Final   Organism ID, Bacteria STAPHYLOCOCCUS LUGDUNENSIS  Final      Susceptibility   Staphylococcus epidermidis - MIC*    CIPROFLOXACIN <=0.5 SENSITIVE Sensitive     ERYTHROMYCIN <=0.25 SENSITIVE Sensitive     GENTAMICIN <=0.5 SENSITIVE Sensitive     OXACILLIN <=0.25 SENSITIVE Sensitive     TETRACYCLINE >=16 RESISTANT Resistant     VANCOMYCIN 2 SENSITIVE Sensitive     TRIMETH/SULFA <=10 SENSITIVE Sensitive     CLINDAMYCIN <=0.25 SENSITIVE Sensitive     RIFAMPIN <=0.5 SENSITIVE Sensitive     Inducible Clindamycin NEGATIVE Sensitive     * STAPHYLOCOCCUS EPIDERMIDIS   Staphylococcus lugdunensis - MIC*    CIPROFLOXACIN <=0.5 SENSITIVE Sensitive     ERYTHROMYCIN <=0.25 SENSITIVE Sensitive     GENTAMICIN <=0.5 SENSITIVE Sensitive     OXACILLIN 2 SENSITIVE Sensitive     TETRACYCLINE <=1 SENSITIVE Sensitive     VANCOMYCIN <=0.5 SENSITIVE Sensitive     TRIMETH/SULFA <=10 SENSITIVE Sensitive     CLINDAMYCIN <=0.25 SENSITIVE Sensitive     RIFAMPIN <=0.5 SENSITIVE Sensitive     Inducible Clindamycin NEGATIVE Sensitive     * STAPHYLOCOCCUS LUGDUNENSIS  Blood Culture ID Panel (Reflexed)     Status: Abnormal   Collection Time: 08/06/23  6:05 PM  Result Value Ref Range Status   Enterococcus faecalis NOT DETECTED NOT DETECTED Final   Enterococcus Faecium NOT DETECTED NOT DETECTED Final   Listeria monocytogenes NOT DETECTED NOT DETECTED Final   Staphylococcus species DETECTED (A) NOT DETECTED Final    Comment: CRITICAL RESULT CALLED TO, READ BACK BY AND VERIFIED WITH: PHARMD MELISSA JAMES ON 08/07/23 @ 1709 BY DRT    Staphylococcus aureus (BCID) NOT DETECTED NOT DETECTED Final   Staphylococcus epidermidis DETECTED (A) NOT  DETECTED Final    Comment: Methicillin (oxacillin) resistant coagulase negative staphylococcus. Possible blood culture contaminant  (unless isolated from more than one blood culture draw or clinical case suggests pathogenicity). No antibiotic treatment is indicated for blood  culture contaminants. CRITICAL RESULT CALLED TO, READ BACK BY AND VERIFIED WITH: PHARMD MELISSA JAMES ON 08/07/23 @ 1709 BY DRT    Staphylococcus lugdunensis DETECTED (A) NOT DETECTED Final    Comment: Methicillin (oxacillin) resistant coagulase negative staphylococcus. Possible blood culture contaminant (unless isolated from more than one blood culture draw or clinical case suggests pathogenicity). No antibiotic treatment is indicated for blood  culture contaminants. CRITICAL RESULT CALLED TO, READ BACK BY AND VERIFIED WITH: PHARMD MELISSA JAMES ON 08/07/23 @ 1709 BY DRT    Streptococcus species DETECTED (A) NOT DETECTED Final    Comment: Not Enterococcus species, Streptococcus agalactiae, Streptococcus pyogenes, or Streptococcus pneumoniae. CRITICAL RESULT CALLED TO, READ BACK BY AND VERIFIED WITH: PHARMD MELISSA JAMES ON 08/07/23 @ 1709 BY DRT    Streptococcus agalactiae NOT DETECTED NOT DETECTED Final   Streptococcus pneumoniae NOT DETECTED NOT DETECTED Final   Streptococcus pyogenes NOT DETECTED NOT DETECTED Final   A.calcoaceticus-baumannii NOT DETECTED NOT DETECTED Final   Bacteroides fragilis NOT DETECTED NOT DETECTED Final   Enterobacterales NOT DETECTED NOT DETECTED Final   Enterobacter cloacae complex NOT DETECTED NOT DETECTED Final   Escherichia coli NOT DETECTED NOT DETECTED Final   Klebsiella aerogenes NOT DETECTED NOT DETECTED Final   Klebsiella oxytoca NOT DETECTED NOT DETECTED Final   Klebsiella pneumoniae NOT DETECTED NOT DETECTED Final   Proteus species NOT DETECTED NOT DETECTED Final   Salmonella species NOT DETECTED NOT DETECTED Final   Serratia marcescens NOT DETECTED NOT DETECTED Final   Haemophilus influenzae NOT DETECTED NOT DETECTED Final   Neisseria meningitidis NOT DETECTED NOT DETECTED Final   Pseudomonas  aeruginosa NOT DETECTED NOT DETECTED Final   Stenotrophomonas maltophilia NOT DETECTED NOT DETECTED Final   Candida albicans NOT DETECTED NOT DETECTED Final   Candida auris NOT DETECTED NOT DETECTED Final   Candida glabrata NOT DETECTED NOT DETECTED Final   Candida krusei NOT DETECTED NOT DETECTED Final   Candida parapsilosis NOT DETECTED NOT DETECTED Final   Candida tropicalis NOT DETECTED NOT DETECTED Final   Cryptococcus neoformans/gattii NOT DETECTED NOT DETECTED Final   Methicillin resistance mecA/C DETECTED (A) NOT DETECTED Final    Comment: CRITICAL RESULT CALLED TO, READ BACK BY AND VERIFIED WITH: PHARMD MELISSA JAMES ON 08/07/23 @ 1709 BY DRT Performed at [facility], [address] [place], [place] [address]   Blood culture (routine x 2)     Status: Abnormal   Collection Time: 08/06/23  6:15 PM   Specimen: BLOOD  Result Value Ref Range Status   Specimen Description   Final    BLOOD BLOOD RIGHT WRIST Performed at [facility], [address], [place], [place] [address]    Special Requests   Final    BOTTLES DRAWN AEROBIC AND ANAEROBIC Blood Culture results may not be optimal due to an inadequate volume of blood received in culture bottles Performed at [facility], [address] [place], [place] [address]    Culture  Setup Time   Final    GRAM POSITIVE COCCI IN CLUSTERS ANAEROBIC BOTTLE ONLY CRITICAL VALUE NOTED.  VALUE IS CONSISTENT WITH PREVIOUSLY REPORTED AND CALLED VALUE. Performed at [facility], [address], [place], [place] [address]    Culture STAPHYLOCOCCUS EPIDERMIDIS (  A)  Final   Report Status 08/09/2023 FINAL  Final   Organism ID, Bacteria STAPHYLOCOCCUS EPIDERMIDIS  Final      Susceptibility   Staphylococcus epidermidis - MIC*    CIPROFLOXACIN <=0.5 SENSITIVE Sensitive     ERYTHROMYCIN <=0.25 SENSITIVE Sensitive     GENTAMICIN <=0.5 SENSITIVE Sensitive     OXACILLIN <=0.25 SENSITIVE Sensitive      TETRACYCLINE <=1 SENSITIVE Sensitive     VANCOMYCIN 1 SENSITIVE Sensitive     TRIMETH/SULFA <=10 SENSITIVE Sensitive     CLINDAMYCIN <=0.25 SENSITIVE Sensitive     RIFAMPIN <=0.5 SENSITIVE Sensitive     Inducible Clindamycin NEGATIVE Sensitive     * STAPHYLOCOCCUS EPIDERMIDIS  Resp panel by RT-PCR (RSV, Flu A&B, Covid) Anterior Nasal Swab     Status: None   Collection Time: 08/07/23 12:51 AM   Specimen: Anterior Nasal Swab  Result Value Ref Range Status   SARS Coronavirus 2 by RT PCR NEGATIVE NEGATIVE Final    Comment: (NOTE) SARS-CoV-2 target nucleic acids are NOT DETECTED.  The SARS-CoV-2 RNA is generally detectable in upper respiratory specimens during the acute phase of infection. The lowest concentration of SARS-CoV-2 viral copies this assay can detect is 138 copies/mL. A negative result does not preclude SARS-Cov-2 infection and should not be used as the sole basis for treatment or other patient management decisions. A negative result may occur with  improper specimen collection/handling, submission of specimen other than nasopharyngeal swab, presence of viral mutation(s) within the areas targeted by this assay, and inadequate number of viral copies(<138 copies/mL). A negative result must be combined with clinical observations, patient history, and epidemiological information. The expected result is Negative.  Fact Sheet for Patients:  BloggerCourse.com  Fact Sheet for Healthcare Providers:  SeriousBroker.it  This test is no t yet approved or cleared by the [place] FDA and  has been authorized for detection and/or diagnosis of SARS-CoV-2 by FDA under an Emergency Use Authorization (EUA). This EUA will remain  in effect (meaning this test can be used) for the duration of the COVID-19 declaration under Section 564(b)(1) of the Act, 21 U.S.C.section 360bbb-3(b)(1), unless the authorization is terminated  or revoked  sooner.       Influenza A by PCR NEGATIVE NEGATIVE Final   Influenza B by PCR NEGATIVE NEGATIVE Final    Comment: (NOTE) The Xpert Xpress SARS-CoV-2/FLU/RSV plus assay is intended as an aid in the diagnosis of influenza from Nasopharyngeal swab specimens and should not be used as a sole basis for treatment. Nasal washings and aspirates are unacceptable for Xpert Xpress SARS-CoV-2/FLU/RSV testing.  Fact Sheet for Patients: BloggerCourse.com  Fact Sheet for Healthcare Providers: SeriousBroker.it  This test is not yet approved or cleared by the [place] FDA and has been authorized for detection and/or diagnosis of SARS-CoV-2 by FDA under an Emergency Use Authorization (EUA). This EUA will remain in effect (meaning this test can be used) for the duration of the COVID-19 declaration under Section 564(b)(1) of the Act, 21 U.S.C. section 360bbb-3(b)(1), unless the authorization is terminated or revoked.     Resp Syncytial Virus by PCR NEGATIVE NEGATIVE Final    Comment: (NOTE) Fact Sheet for Patients: BloggerCourse.com  Fact Sheet for Healthcare Providers: SeriousBroker.it  This test is not yet approved or cleared by the [place] FDA and has been authorized for detection and/or diagnosis of SARS-CoV-2 by FDA under an Emergency Use Authorization (EUA). This EUA will remain in effect (meaning this test can be used) for  the duration of the COVID-19 declaration under Section 564(b)(1) of the Act, 21 U.S.C. section 360bbb-3(b)(1), unless the authorization is terminated or revoked.  Performed at [facility], [address] [place], [place] [address]   Expectorated Sputum Assessment w Gram Stain, Rflx to Resp Cult     Status: None   Collection Time: 08/07/23  4:39 AM   Specimen: Expectorated Sputum  Result Value Ref Range Status   Specimen Description  EXPECTORATED SPUTUM  Final   Special Requests NONE  Final   Sputum evaluation   Final    Sputum specimen not acceptable for testing.  Please recollect.   NOTIFIED Janace Hoard RN AT [phone] ON 08/07/2023 BY MECIAL J. Performed at [facility], [address] [place], [place] [address]    Report Status 08/07/2023 FINAL  Final  Culture, blood (Routine X 2) w Reflex to ID Panel     Status: None   Collection Time: 08/08/23  9:29 AM   Specimen: BLOOD  Result Value Ref Range Status   Specimen Description   Final    BLOOD SITE NOT SPECIFIED Performed at [facility], [address] [place], [place] [address]    Special Requests   Final    BOTTLES DRAWN AEROBIC AND ANAEROBIC Blood Culture results may not be optimal due to an inadequate volume of blood received in culture bottles Performed at [facility], [address] [place], [place] [address]    Culture   Final    NO GROWTH 5 DAYS Performed at [facility], [address] [place], [place] [address]    Report Status 08/13/2023 FINAL  Final  Culture, blood (Routine X 2) w Reflex to ID Panel     Status: None   Collection Time: 08/08/23  9:41 AM   Specimen: BLOOD  Result Value Ref Range Status   Specimen Description   Final    BLOOD SITE NOT SPECIFIED Performed at [facility], [address] [place], [place] [address]    Special Requests   Final    BOTTLES DRAWN AEROBIC AND ANAEROBIC Blood Culture results may not be optimal due to an inadequate volume of blood received in culture bottles Performed at [facility], [address], [place], [place] [address]    Culture   Final    NO GROWTH 5 DAYS Performed at [facility], [address] [place], [place] [address]    Report Status 08/13/2023 FINAL  Final    Radiology Studies: No results found.    Lulia Schriner T. Ashrith Sagan [facility]  If 7PM-7AM, please contact  night-coverage [email or web address] 08/16/2023, 1:05 PM

## 2023-08-17 ENCOUNTER — Other Ambulatory Visit (HOSPITAL_COMMUNITY): Payer: Self-pay

## 2023-08-17 DIAGNOSIS — N8110 Cystocele, unspecified: Secondary | ICD-10-CM

## 2023-08-17 DIAGNOSIS — Z765 Malingerer [conscious simulation]: Secondary | ICD-10-CM

## 2023-08-17 DIAGNOSIS — Z5900 Homelessness unspecified: Secondary | ICD-10-CM

## 2023-08-17 LAB — CBC WITH DIFFERENTIAL/PLATELET
Abs Immature Granulocytes: 0.21 10*3/uL — ABNORMAL HIGH (ref 0.00–0.07)
Basophils Absolute: 0.1 10*3/uL (ref 0.0–0.1)
Basophils Relative: 1 %
Eosinophils Absolute: 0.2 10*3/uL (ref 0.0–0.5)
Eosinophils Relative: 2 %
HCT: 41.3 % (ref 36.0–46.0)
Hemoglobin: 13.2 g/dL (ref 12.0–15.0)
Immature Granulocytes: 2 %
Lymphocytes Relative: 16 %
Lymphs Abs: 2 10*3/uL (ref 0.7–4.0)
MCH: 32.5 pg (ref 26.0–34.0)
MCHC: 32 g/dL (ref 30.0–36.0)
MCV: 101.7 fL — ABNORMAL HIGH (ref 80.0–100.0)
Monocytes Absolute: 1.2 10*3/uL — ABNORMAL HIGH (ref 0.1–1.0)
Monocytes Relative: 10 %
Neutro Abs: 8.9 10*3/uL — ABNORMAL HIGH (ref 1.7–7.7)
Neutrophils Relative %: 69 %
Platelets: 291 10*3/uL (ref 150–400)
RBC: 4.06 MIL/uL (ref 3.87–5.11)
RDW: 13.8 % (ref 11.5–15.5)
WBC: 12.6 10*3/uL — ABNORMAL HIGH (ref 4.0–10.5)
nRBC: 0 % (ref 0.0–0.2)

## 2023-08-17 MED ORDER — LEVOTHYROXINE SODIUM 25 MCG PO TABS
25.0000 ug | ORAL_TABLET | Freq: Every day | ORAL | 2 refills | Status: DC
  Filled 2023-08-17: qty 30, 30d supply, fill #0

## 2023-08-17 MED ORDER — NAPHAZOLINE-GLYCERIN 0.012-0.25 % OP SOLN
1.0000 [drp] | Freq: Four times a day (QID) | OPHTHALMIC | Status: DC | PRN
  Filled 2023-08-17: qty 15

## 2023-08-17 MED ORDER — ALBUTEROL SULFATE HFA 108 (90 BASE) MCG/ACT IN AERS
2.0000 | INHALATION_SPRAY | Freq: Four times a day (QID) | RESPIRATORY_TRACT | 1 refills | Status: DC | PRN
  Filled 2023-08-17: qty 6.7, 25d supply, fill #0
  Filled 2023-10-22 (×2): qty 6.7, 25d supply, fill #1
  Filled 2023-10-22: qty 6.7, 25d supply, fill #0

## 2023-08-17 MED ORDER — BUDESONIDE-FORMOTEROL FUMARATE 160-4.5 MCG/ACT IN AERO
2.0000 | INHALATION_SPRAY | Freq: Two times a day (BID) | RESPIRATORY_TRACT | 12 refills | Status: AC
  Filled 2023-08-17: qty 10.2, 30d supply, fill #0
  Filled 2023-12-17: qty 10.2, 30d supply, fill #1
  Filled 2024-07-09: qty 10.2, 30d supply, fill #2

## 2023-08-17 NOTE — Discharge Summary (Signed)
Physician Discharge Summary  Stacy Bolton SEG:[id number] DOB: [birth year] DOA: 08/06/2023  PCP: Patient, No Pcp Per  Admit date: 08/06/2023 Discharge date: 08/17/2023 Admitted From: Home Disposition: Home Recommendations for Outpatient Follow-up:  Follow up with PCP in in 1 to 2 weeks Outpatient follow-up with orthopedic surgery as previously planned Please follow up on the following pending results: None  Home Health: No need identified Equipment/Devices: No need identified  Discharge Condition: Stable CODE STATUS: Full code   Hospital course 52 year old F with PMH of COPD, anxiety, bipolar disorder, polysubstance use including EtOH, cocaine, THC and tobacco, homelessness and recent left arm fracture presenting with shortness of breath, productive cough with yellowish phlegm, fever, chills and diaphoresis and admitted with community-acquired pneumonia and COPD exacerbation.  Checks x-ray showed RML infiltrate.  Patient smokes about a pack a day.  She was started on ceftriaxone, Zithromax, steroid and breathing treatments and admitted.   Blood culture with Staph epidermidis with different sensitivity patterns, Staph lugdunensis in 1 out of 2 bottles and Streptococcus species. Vancomycin added.  Repeat blood culture NGTD.  TTE without significant finding.  TEE deferred.  Antibiotics de-escalated to Ancef and wrapped up the treatment course with a dose of [place] prior to discharge per ID.   See individual problem list below for more.   Problems addressed during this hospitalization Polymicrobial bacteremia?  Contaminated blood sample? Blood culture with Staph epidermidis with different sensitivity patterns, lugdunensis in 1 out of 2 bottles and Streptococcus species.  Denies IVDU.  Repeat blood culture NGTD.  Hepatitis panel negative.  Pro-Cal negative.  TTE without vegetation.  TEE deferred.  -Ceftriaxone 12/10-12/15 -Vancomycin 12/12>> IV Ancef 12/16>>[place] on 12/20    Community-acquired pneumonia of the right middle lobe: CXR with RML infiltrate.  Reportedly hypoxic to 80s on RA prior to arrival to ED. Still with productive cough, rhonchi and chest congestion.  Smokes about a pack a day.  COVID-19, influenza and RSV PCR nonreactive.  Blood cultures as above.  Strep pneumo and Legionella antigen negative.  Antibiotics as above.  Also receives Zithromax 12/10-12/11.  Resolved.   COPD with acute exacerbation: Likely due to the above and smoking.  Improved. -Completed 5 days of steroid -Discharged on Symbicort and albuterol. -Encouraged smoking cessation   Polysubstance substance use disorder: Smokes about a pack a day.  Also admits to using cocaine and drinking alcohol but not quantifying.  Denies IVDU.  UDS positive for opiate, cocaine and benzodiazepine.  Seems to have some withdrawal with nasal congestion, anxiety... -Encouraged smoking cessation and refraining from cocaine and alcohol use   Bipolar disorder/anxiety: Seen by psychiatry on last admission and recommended discontinuation of home meds due to noncompliance and ongoing substance use.  She does not take Depakote, Risperdal or Prozac.   Distal left radial fracture: after fall on 12/2.  Seen by orthopedics Dr. Alena Bills and scheduled for ORIF 12/19. -Outpatient follow-up with orthopedic surgery as previously planned.   Nasal congestion: Likely due to withdrawal. -Continue Flonase and Claritin   Pruritus/skin itch: Resolved.   Rectal prolapse: Chronic issue. -Outpatient follow-up -Advised to avoid constipation -Bowel regimen   Elevated TSH: 17.6 (previously 4.7 about 6 months ago and normal before that).  Free T40.59. -Started low-dose Synthroid   Severe malnutrition: Nutrition Problem: Severe Malnutrition Etiology: social / environmental circumstances Signs/Symptoms: severe fat depletion, severe muscle depletion, percent weight loss (10% in 2 weeks) Percent weight loss: 10 % (in 2  weeks) Interventions: Refer to RD note for recommendations, Ensure Enlive (  each supplement provides 350kcal and 20 grams of protein), Magic cup, MVI     Time spent 35 minutes  Vital signs Vitals:   08/16/23 2220 08/17/23 0659 08/17/23 0905 08/17/23 1352  BP: 119/81 101/67    Pulse: 86 87    Temp:  98.5 F (36.9 C)    Resp:  16    Height:      Weight:      SpO2:  94% 95% 90%  TempSrc:  Oral    BMI (Calculated):         Discharge exam  GENERAL: No apparent distress.  Nontoxic. HEENT: MMM.  Vision and hearing grossly intact.  NECK: Supple.  No apparent JVD.  RESP:  No IWOB.  Fair aeration bilaterally. CVS:  RRR. Heart sounds normal.  ABD/GI/GU: BS+. Abd soft, NTND.  MSK/EXT:  Moves extremities. No apparent deformity. No edema.  SKIN: no apparent skin lesion or wound NEURO: Awake and alert. Oriented appropriately.  No apparent focal neuro deficit. PSYCH: Calm. Normal affect.   Discharge Instructions Discharge Instructions     Diet general   Complete by: As directed    Discharge instructions   Complete by: As directed    It has been a pleasure taking care of you!  You were hospitalized for pneumonia and blood infection for which you have been treated with IV antibiotics.  Continue using your inhalers for COPD.  Follow-up with your primary care doctor in 1 to 2 weeks or sooner if needed.  Follow-up with orthopedic surgery as previously planned.      Take care,   Increase activity slowly   Complete by: As directed       Allergies as of 08/17/2023       Reactions   Hydrocodone-acetaminophen Itching   Hydromorphone Hcl Itching   Oxycodone-acetaminophen Itching        Medication List     STOP taking these medications    divalproex 500 MG 24 hr tablet Commonly known as: DEPAKOTE ER   FLUoxetine 20 MG capsule Commonly known as: PROZAC   magnesium citrate Soln   ondansetron 4 MG tablet Commonly known as: ZOFRAN   predniSONE 50 MG tablet Commonly  known as: DELTASONE   risperiDONE 2 MG tablet Commonly known as: RISPERDAL       TAKE these medications    acetaminophen 500 MG tablet Commonly known as: TYLENOL Take 1,000 mg by mouth as needed for mild pain (pain score 1-3) or moderate pain (pain score 4-6).   albuterol 108 (90 Base) MCG/ACT inhaler Commonly known as: Proventil HFA Inhale 2 puffs into the lungs every 6 (six) hours as needed for wheezing or shortness of breath. What changed: how much to take   budesonide-formoterol 160-4.5 MCG/ACT inhaler Commonly known as: Symbicort Inhale 2 puffs into the lungs in the morning and at bedtime.   cetirizine 10 MG tablet Commonly known as: ZyrTEC Allergy Take 1 tablet (10 mg total) by mouth daily for 10 days.   feeding supplement Liqd Take 237 mLs by mouth 3 (three) times daily between meals.   fluticasone 50 MCG/ACT nasal spray Commonly known as: FLONASE Place 2 sprays into both nostrils daily.   levothyroxine 25 MCG tablet Commonly known as: SYNTHROID Take 1 tablet (25 mcg total) by mouth daily before breakfast.   mouth rinse Liqd solution 15 mLs by Mouth Rinse route 4 (four) times daily.   multivitamin with minerals Tabs tablet Take 1 tablet by mouth daily.   nicotine 21 mg/24hr  patch Commonly known as: NICODERM CQ - dosed in mg/24 hours Place 1 patch (21 mg total) onto the skin daily.   nicotine polacrilex 2 MG gum Commonly known as: NICORETTE Take 1 each (2 mg total) by mouth as needed for smoking cessation.   oxyCODONE 5 MG immediate release tablet Commonly known as: Roxicodone Take 1 tablet (5 mg total) by mouth every 6 (six) hours as needed for severe pain (pain score 7-10).   psyllium 95 % Pack Commonly known as: HYDROCIL/METAMUCIL Take 1 packet by mouth daily.   sodium chloride 0.65 % Soln nasal spray Commonly known as: OCEAN Place 1 spray into both nostrils as needed for congestion (nose irritation).   traMADol 50 MG tablet Commonly known as:  ULTRAM Take 50 mg by mouth every 6 (six) hours as needed for moderate pain (pain score 4-6) or severe pain (pain score 7-10).   traZODone 50 MG tablet Commonly known as: DESYREL Take 1 tablet (50 mg total) by mouth at bedtime as needed for sleep.        Consultations: Infectious disease  Procedures/Studies:   ECHOCARDIOGRAM COMPLETE Result Date: 08/09/2023    ECHOCARDIOGRAM REPORT   Patient Name:   Stacy Bolton Date of Exam: 08/09/2023 Medical Rec #:  [id number]              Height:       65.0 in Accession #:    [id number]             Weight:       108.2 lb Date of Birth:  December 12, 1970               BSA:          1.524 m Patient Age:    52 years               BP:           110/83 mmHg Patient Gender: F                      HR:           108 bpm. Exam Location:  Inpatient Procedure: 2D Echo, Color Doppler and Cardiac Doppler Indications:    Endocarditis  History:        Patient has no prior history of Echocardiogram examinations.                 COPD.  Sonographer:    Irving Burton Senior RDCS Referring Phys: [id number] [facility]  Sonographer Comments: No parasternals due to COPD IMPRESSIONS  1. Left ventricular ejection fraction, by estimation, is 65 to 70%. The left ventricle has normal function. The left ventricle has no regional wall motion abnormalities. Left ventricular diastolic parameters were normal.  2. Right ventricular systolic function is normal. The right ventricular size is normal. Tricuspid regurgitation signal is inadequate for assessing PA pressure.  3. The mitral valve is grossly normal. No evidence of mitral valve regurgitation.  4. The aortic valve was not well visualized. Aortic valve regurgitation is not visualized. No aortic stenosis is present.  5. The inferior vena cava is normal in size with greater than 50% respiratory variability, suggesting right atrial pressure of 3 mmHg. FINDINGS  Left Ventricle: Left ventricular ejection fraction, by estimation, is 65 to 70%. The  left ventricle has normal function. The left ventricle has no regional wall motion abnormalities. The left ventricular internal cavity size was normal in size. There is  no left  ventricular hypertrophy. Left ventricular diastolic parameters were normal. Right Ventricle: The right ventricular size is normal. Right vetricular wall thickness was not well visualized. Right ventricular systolic function is normal. Tricuspid regurgitation signal is inadequate for assessing PA pressure. Left Atrium: Left atrial size was normal in size. Right Atrium: Right atrial size was normal in size. Pericardium: There is no evidence of pericardial effusion. Mitral Valve: The mitral valve is grossly normal. No evidence of mitral valve regurgitation. Tricuspid Valve: The tricuspid valve is grossly normal. Tricuspid valve regurgitation is trivial. Aortic Valve: The aortic valve was not well visualized. Aortic valve regurgitation is not visualized. No aortic stenosis is present. Pulmonic Valve: The pulmonic valve was not well visualized. Aorta: The aortic root was not well visualized. Venous: The inferior vena cava is normal in size with greater than 50% respiratory variability, suggesting right atrial pressure of 3 mmHg. IAS/Shunts: The atrial septum is grossly normal.   Diastology LV e' medial:    11.60 cm/s LV E/e' medial:  6.2 LV e' lateral:   12.20 cm/s LV E/e' lateral: 5.9  RIGHT VENTRICLE RV S prime:     15.40 cm/s TAPSE (M-mode): 1.9 cm LEFT ATRIUM           Index        RIGHT ATRIUM          Index LA Vol (A2C): 21.5 ml 14.11 ml/m  RA Area:     8.74 cm LA Vol (A4C): 16.5 ml 10.83 ml/m  RA Volume:   17.15 ml 11.26 ml/m  AORTIC VALVE LVOT Vmax:   121.00 cm/s LVOT Vmean:  75.600 cm/s LVOT VTI:    0.159 m MITRAL VALVE MV Area (PHT): 3.19 cm    SHUNTS MV Decel Time: 238 msec    Systemic VTI: 0.16 m MV E velocity: 71.60 cm/s MV A velocity: 75.90 cm/s MV E/A ratio:  0.94 Weston Brass MD Electronically signed by Weston Brass MD  Signature Date/Time: 08/09/2023/7:40:18 PM    Final    DG Chest 2 View Result Date: 08/06/2023 CLINICAL DATA:  Shortness of breath for a few hours. EXAM: CHEST - 2 VIEW COMPARISON:  Chest x-ray dated July 29, 2023. FINDINGS: The heart size and mediastinal contours are within normal limits. Normal pulmonary vascularity. The lungs remain hyperinflated with emphysematous changes. New hazy airspace disease in the right middle lobe. Bilateral nipple shadows noted. No pleural effusion or pneumothorax. IMPRESSION: 1. New right middle lobe pneumonia. Followup PA and lateral chest X-ray is recommended in 3-4 weeks following trial of antibiotic therapy to ensure resolution. 2. COPD. Electronically Signed   By: Obie Dredge M.D.   On: 08/06/2023 16:03   CT Head Wo Contrast Result Date: 07/30/2023 CLINICAL DATA:  Head trauma, moderate-severe; Neck trauma, dangerous injury mechanism (Age 33-64y) Fall EXAM: CT HEAD WITHOUT CONTRAST CT CERVICAL SPINE WITHOUT CONTRAST TECHNIQUE: Multidetector CT imaging of the head and cervical spine was performed following the standard protocol without intravenous contrast. Multiplanar CT image reconstructions of the cervical spine were also generated. RADIATION DOSE REDUCTION: This exam was performed according to the departmental dose-optimization program which includes automated exposure control, adjustment of the mA and/or kV according to patient size and/or use of iterative reconstruction technique. COMPARISON:  None Available. FINDINGS: CT HEAD FINDINGS Brain: No evidence of large-territorial acute infarction. No parenchymal hemorrhage. No mass lesion. No extra-axial collection. No mass effect or midline shift. No hydrocephalus. Basilar cisterns are patent. Vascular: No hyperdense vessel. Skull: No acute fracture or focal lesion.  Old healed left zygomatic arch fracture. Sinuses/Orbits: Left maxillary sinus mucosal thickening. Otherwise paranasal sinuses and mastoid air cells are  clear. The orbits are unremarkable. Other: None. CT CERVICAL SPINE FINDINGS Alignment: Normal. 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 No acute fracture. No aggressive appearing focal osseous lesion or focal pathologic process. Soft tissues and spinal canal: No prevertebral fluid or swelling. No visible canal hematoma. Upper chest: Paraseptal and centrilobular emphysematous changes. Other: None. IMPRESSION: 1. No acute intracranial abnormality. 2. No acute displaced fracture or traumatic listhesis of the cervical spine. 3.  Emphysema (ICD10-J43.9). 4. 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 Electronically Signed   By: Tish Frederickson M.D.   On: 07/30/2023 01:27   CT Cervical Spine Wo Contrast Result Date: 07/30/2023 CLINICAL DATA:  Head trauma, moderate-severe; Neck trauma, dangerous injury mechanism (Age 19-64y) Fall EXAM: CT HEAD WITHOUT CONTRAST CT CERVICAL SPINE WITHOUT CONTRAST TECHNIQUE: Multidetector CT imaging of the head and cervical spine was performed following the standard protocol without intravenous contrast. Multiplanar CT image reconstructions of the cervical spine were also generated. RADIATION DOSE REDUCTION: This exam was performed according to the departmental dose-optimization program which includes automated exposure control, adjustment of the mA and/or kV according to patient size and/or use of iterative reconstruction technique. COMPARISON:  None Available. FINDINGS: CT HEAD FINDINGS Brain: No evidence of large-territorial acute infarction. No parenchymal hemorrhage. No mass lesion. No extra-axial collection. No mass effect or midline shift. No hydrocephalus. Basilar cisterns are patent. Vascular: No hyperdense vessel. Skull: No acute fracture  or focal lesion. Old healed left zygomatic arch fracture. Sinuses/Orbits: Left maxillary sinus mucosal thickening. Otherwise paranasal sinuses and mastoid air cells are clear. The orbits are unremarkable. Other: None. CT CERVICAL SPINE FINDINGS Alignment: Normal. 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 No acute fracture. No aggressive appearing focal osseous lesion or focal pathologic process. Soft tissues and spinal canal: No prevertebral fluid or swelling. No visible canal hematoma. Upper chest: Paraseptal and centrilobular emphysematous changes. Other: None. IMPRESSION: 1. No acute intracranial abnormality. 2. No acute displaced fracture or traumatic listhesis of the cervical spine. 3.  Emphysema (ICD10-J43.9). 4. 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 Electronically Signed   By: Tish Frederickson M.D.   On: 07/30/2023 01:27   DG Chest Portable 1 View Result Date: 07/29/2023 CLINICAL DATA:  Chest pain with stuffy ears and nose. EXAM: PORTABLE CHEST 1 VIEW COMPARISON:  July 13, 2023 FINDINGS: The heart size and mediastinal contours are within normal limits. There is mild calcification of the aortic arch. The lungs are hyperinflated. No acute infiltrate, pleural effusion or pneumothorax is identified. The visualized skeletal structures are unremarkable. IMPRESSION: No active cardiopulmonary disease. Electronically Signed   By: Aram Candela M.D.   On: 07/29/2023 23:15   DG Wrist Complete Left Result Date: 07/29/2023 CLINICAL DATA:  Status post fall. EXAM: LEFT WRIST - COMPLETE 3+ VIEW COMPARISON:  None Available. FINDINGS: There is an acute, comminuted fracture deformity involving the distal left radius. Approximately 1/2 shaft width dorsal displacement of  the distal fracture site is noted. A mildly displaced fracture of the left ulnar styloid  is also seen. There is no evidence of dislocation. Moderate severity diffuse soft tissue swelling is present. IMPRESSION: Acute fractures of the distal left radius and left ulnar styloid. Electronically Signed   By: Aram Candela M.D.   On: 07/29/2023 23:13       The results of significant diagnostics from this hospitalization (including imaging, microbiology, ancillary and laboratory) are listed below for reference.     Microbiology: Recent Results (from the past 240 hours)  Culture, blood (Routine X 2) w Reflex to ID Panel     Status: None   Collection Time: 08/08/23  9:29 AM   Specimen: BLOOD  Result Value Ref Range Status   Specimen Description   Final    BLOOD SITE NOT SPECIFIED Performed at [facility], [address], [place], [place] [address]    Special Requests   Final    BOTTLES DRAWN AEROBIC AND ANAEROBIC Blood Culture results may not be optimal due to an inadequate volume of blood received in culture bottles Performed at [facility], [address] [place], [place] [address]    Culture   Final    NO GROWTH 5 DAYS Performed at [facility], [address], [place], [place] [address]    Report Status 08/13/2023 FINAL  Final  Culture, blood (Routine X 2) w Reflex to ID Panel     Status: None   Collection Time: 08/08/23  9:41 AM   Specimen: BLOOD  Result Value Ref Range Status   Specimen Description   Final    BLOOD SITE NOT SPECIFIED Performed at [facility], [address], [place], [place] [address]    Special Requests   Final    BOTTLES DRAWN AEROBIC AND ANAEROBIC Blood Culture results may not be optimal due to an inadequate volume of blood received in culture bottles Performed at [facility], [address] [place], [place] [address]    Culture   Final    NO GROWTH 5 DAYS Performed  at [facility], [address], [place], [place] [address]    Report Status 08/13/2023 FINAL  Final     Labs:  CBC: Recent Labs  Lab 08/11/23 0327 08/14/23 0401 08/16/23 0338 08/17/23 0340  WBC 13.1* 13.1* 14.8* 12.6*  NEUTROABS  --   --   --  8.9*  HGB 15.1* 13.9 12.8 13.2  HCT 47.6* 43.3 38.5 41.3  MCV 103.3* 102.9* 100.8* 101.7*  PLT 333 317 293 291   BMP &GFR Recent Labs  Lab 08/11/23 0327 08/14/23 0401 08/15/23 0404 08/16/23 0338  NA 137 137 135 134*  K 3.9 4.1 4.1 4.1  CL 100 100 97* 97*  CO2 28 27 30 28   GLUCOSE 119* 78 92 98  BUN 17 26* 24* 23*  CREATININE 0.61 0.45 0.47 0.51  CALCIUM 8.9 8.9 8.7* 8.5*  MG 2.2 2.3  --  2.1  PHOS  --  4.3  --  4.5   Estimated Creatinine Clearance: 62.3 mL/min (by C-G formula based on SCr of 0.51 mg/dL). Liver & Pancreas: Recent Labs  Lab 08/11/23 0327 08/14/23 0401 08/16/23 0338  AST 20  --   --   ALT 21  --   --   ALKPHOS 78  --   --   BILITOT 0.4  --   --   PROT 6.3*  --   --   ALBUMIN 3.5 3.4* 3.3*   No results for input(s): "LIPASE", "  AMYLASE" in the last 168 hours. No results for input(s): "AMMONIA" in the last 168 hours. Diabetic: No results for input(s): "HGBA1C" in the last 72 hours. No results for input(s): "GLUCAP" in the last 168 hours. Cardiac Enzymes: No results for input(s): "CKTOTAL", "CKMB", "CKMBINDEX", "TROPONINI" in the last 168 hours. No results for input(s): "PROBNP" in the last 8760 hours. Coagulation Profile: No results for input(s): "INR", "PROTIME" in the last 168 hours. Thyroid Function Tests: Recent Labs    08/15/23 0404 08/16/23 0906  TSH 17.644*  --   FREET4  --  0.59*   Lipid Profile: No results for input(s): "CHOL", "HDL", "LDLCALC", "TRIG", "CHOLHDL", "LDLDIRECT" in the last 72 hours. Anemia Panel: No results for input(s): "VITAMINB12", "FOLATE", "FERRITIN", "TIBC", "IRON", "RETICCTPCT" in the last 72 hours. Urine analysis:    Component Value Date/Time    COLORURINE YELLOW 08/01/2023 2112   APPEARANCEUR HAZY (A) 08/01/2023 2112   LABSPEC 1.025 08/01/2023 2112   PHURINE 5.0 08/01/2023 2112   GLUCOSEU NEGATIVE 08/01/2023 2112   HGBUR NEGATIVE 08/01/2023 2112   BILIRUBINUR NEGATIVE 08/01/2023 2112   KETONESUR NEGATIVE 08/01/2023 2112   PROTEINUR NEGATIVE 08/01/2023 2112   UROBILINOGEN 1.0 03/19/2010 1431   NITRITE POSITIVE (A) 08/01/2023 2112   LEUKOCYTESUR NEGATIVE 08/01/2023 2112   Sepsis Labs: Invalid input(s): "PROCALCITONIN", "LACTICIDVEN"   SIGNED:  Almon Hercules, MD  [facility] 08/17/2023, 4:32 PM

## 2023-08-17 NOTE — Plan of Care (Signed)
  Problem: Health Behavior/Discharge Planning: Goal: Ability to manage health-related needs will improve Outcome: Progressing   Problem: Clinical Measurements: Goal: Ability to maintain clinical measurements within normal limits will improve Outcome: Progressing Goal: Will remain free from infection Outcome: Progressing Goal: Diagnostic test results will improve Outcome: Progressing Goal: Respiratory complications will improve Outcome: Progressing Goal: Cardiovascular complication will be avoided Outcome: Progressing   Problem: Activity: Goal: Risk for activity intolerance will decrease Outcome: Progressing   Problem: Coping: Goal: Level of anxiety will decrease Outcome: Progressing   Problem: Pain Management: Goal: General experience of comfort will improve Outcome: Progressing   Problem: Skin Integrity: Goal: Risk for impaired skin integrity will decrease Outcome: Progressing   Problem: Activity: Goal: Ability to tolerate increased activity will improve Outcome: Progressing   Problem: Respiratory: Goal: Ability to maintain adequate ventilation will improve Outcome: Progressing Goal: Ability to maintain a clear airway will improve Outcome: Progressing   Problem: Activity: Goal: Ability to tolerate increased activity will improve Outcome: Progressing   Problem: Clinical Measurements: Goal: Ability to maintain a body temperature in the normal range will improve Outcome: Progressing   Problem: Respiratory: Goal: Ability to maintain adequate ventilation will improve Outcome: Progressing Goal: Ability to maintain a clear airway will improve Outcome: Progressing

## 2023-08-17 NOTE — TOC Progression Note (Signed)
Transition of Care [facility]) - Progression Note    Patient Details  Name: Stacy Bolton MRN: [id number] Date of Birth: 1971-08-18  Transition of Care [facility]) CM/SW Contact  Georgie Chard, LCSW Phone Number: 08/17/2023, 11:06 AM  Clinical Narrative:    CSW met patient at bedside to deliver match letter from [facility]. At this time the patient reports needing a taxi voucher for transport to a hotel. Patient has stated that she will try to call some friends first. CSW will provide taxi voucher if patient is unable to arrange transport.    Expected Discharge Plan: Homeless Shelter Barriers to Discharge: Inadequate or no insurance, Financial Resources  Expected Discharge Plan and Services In-house Referral: Clinical Social Work     Living arrangements for the past 2 months: Homeless Expected Discharge Date: 08/17/23                                     Social Determinants of Health (SDOH) Interventions SDOH Screenings   Food Insecurity: Food Insecurity Present (08/07/2023)  Housing: Patient Declined (08/07/2023)  Transportation Needs: Unmet Transportation Needs (08/07/2023)  Utilities: At Risk (08/07/2023)  Alcohol Screen: Low Risk  (01/31/2023)  Financial Resource Strain: High Risk (01/16/2023)   Received from [facility], [facility]  Physical Activity: Sufficiently Active (01/16/2023)   Received from [facility], [facility]  Social Connections: Socially Isolated (01/16/2023)   Received from [facility], [facility]  Tobacco Use: High Risk (08/06/2023)  Health Literacy: Low Risk  (01/16/2023)   Received from [facility], [facility]    Readmission Risk Interventions    08/07/2023    1:43 PM 04/30/2023   11:24 AM  Readmission Risk Prevention Plan  Transportation Screening Complete Complete  Medication Review (RN Care Manager) Complete   PCP or Specialist appointment within 3-5 days of discharge Complete   HRI or Home Care Consult   Complete  SW Recovery Care/Counseling Consult Complete Complete  Palliative Care Screening Not Applicable Not Applicable  Skilled Nursing Facility Not Applicable Not Applicable

## 2023-08-17 NOTE — TOC Progression Note (Signed)
Transition of Care [facility]) - Progression Note    Patient Details  Name: Stacy Bolton MRN: [id number] Date of Birth: 11/19/1970  Transition of Care [facility]) CM/SW Contact  Georgie Chard, LCSW Phone Number: 08/17/2023, 1:46 PM  Clinical Narrative:    CSW has provided patient with a charity walker as well a taxi voucher that was given to the current RN. At this time patient will DC to a friends home who in which will help the patient get a hotel. AT this time there are no further TOC need.   Expected Discharge Plan: Homeless Shelter Barriers to Discharge: Inadequate or no insurance, Financial Resources  Expected Discharge Plan and Services In-house Referral: Clinical Social Work     Living arrangements for the past 2 months: Homeless Expected Discharge Date: 08/17/23                                     Social Determinants of Health (SDOH) Interventions SDOH Screenings   Food Insecurity: Food Insecurity Present (08/07/2023)  Housing: Patient Declined (08/07/2023)  Transportation Needs: Unmet Transportation Needs (08/07/2023)  Utilities: At Risk (08/07/2023)  Alcohol Screen: Low Risk  (01/31/2023)  Financial Resource Strain: High Risk (01/16/2023)   Received from [facility], [facility]  Physical Activity: Sufficiently Active (01/16/2023)   Received from [facility], [facility]  Social Connections: Socially Isolated (01/16/2023)   Received from [facility], [facility]  Tobacco Use: High Risk (08/06/2023)  Health Literacy: Low Risk  (01/16/2023)   Received from [facility], [facility]    Readmission Risk Interventions    08/07/2023    1:43 PM 04/30/2023   11:24 AM  Readmission Risk Prevention Plan  Transportation Screening Complete Complete  Medication Review (RN Care Manager) Complete   PCP or Specialist appointment within 3-5 days of discharge Complete   HRI or Home Care Consult  Complete  SW Recovery Care/Counseling  Consult Complete Complete  Palliative Care Screening Not Applicable Not Applicable  Skilled Nursing Facility Not Applicable Not Applicable

## 2023-08-17 NOTE — Plan of Care (Signed)
  Problem: Clinical Measurements: Goal: Ability to maintain clinical measurements within normal limits will improve Outcome: Progressing Goal: Respiratory complications will improve Outcome: Progressing   

## 2023-08-17 NOTE — TOC Progression Note (Signed)
Transition of Care [facility]) - Progression Note    Patient Details  Name: Stacy Bolton MRN: [id number] Date of Birth: Aug 07, 1971  Transition of Care [facility]) CM/SW Contact  Adrian Prows, RN Phone Number: 08/17/2023, 10:09 AM  Clinical Narrative:    TOC notified pt needs assistance to pay for medication; spoke w/ pt in room; pt says she does not have money to pay for prescribed medications; pt also verified that she does not have insurance; pt qualifies for [facility]; letter given to pt.   MATCH MEDICATION ASSISTANCE CARD Pharmacies please call [phone] for claim processing assistance.  Rx BIN: [id number] Rx Group: [id number] Rx PCN: [id number] Relationship Code: 1 Person Code: [id number]  Patient ID (MRN): [name]    Patient Name: Stacy Bolton, Stacy Bolton   Patient DOB: [birth year]   Discharge Date: 08/17/2023  Expiration Date: 08/23/23 (must be filled within 7 days of discharge)   Dear Sunday Corn You have been approved to have the prescriptions written by your discharging physician filled through our [employer] [facility] (Medication Assistance Through [facility]) program. This program allows for a one-time (no refills) 34-day supply of selected medications for a low copay amount.  The copay is $3.00 per prescription. For instance, if you have one prescription, you will pay $3.00; for two prescriptions, you pay $6.00; for three prescriptions, you pay $9.00; and so on. Only certain pharmacies are participating in this program with [facility]. You will need to select one of the pharmacies from the attached lists and take your prescriptions, this letter, and your photo ID to one of the participating pharmacies.  We are excited that you are able to use the [facility] program to get your medications. These prescriptions must be filled within 7 days of hospital discharge or they will no longer be valid for the [facility] program. Should you have any problems with your prescriptions please contact your case  management team member at [phone] for [place] or [phone] for [facility].  Thank you, [name]  Expected Discharge Plan: Homeless Shelter Barriers to Discharge: Inadequate or no insurance, Architect  Expected Discharge Plan and Services In-house Referral: Clinical Social Work     Living arrangements for the past 2 months: Homeless Expected Discharge Date: 08/17/23                                     Social Determinants of Health (SDOH) Interventions SDOH Screenings   Food Insecurity: Food Insecurity Present (08/07/2023)  Housing: Patient Declined (08/07/2023)  Transportation Needs: Unmet Transportation Needs (08/07/2023)  Utilities: At Risk (08/07/2023)  Alcohol Screen: Low Risk  (01/31/2023)  Financial Resource Strain: High Risk (01/16/2023)   Received from [facility], [facility]  Physical Activity: Sufficiently Active (01/16/2023)   Received from [facility], [facility]  Social Connections: Socially Isolated (01/16/2023)   Received from [facility], [facility]  Tobacco Use: High Risk (08/06/2023)  Health Literacy: Low Risk  (01/16/2023)   Received from [facility], [facility] Care    Readmission Risk Interventions    08/07/2023    1:43 PM 04/30/2023   11:24 AM  Readmission Risk Prevention Plan  Transportation Screening Complete Complete  Medication Review (RN Care Manager) Complete   PCP or Specialist appointment within 3-5 days of discharge Complete   HRI or Home Care Consult  Complete  SW Recovery Care/Counseling Consult Complete Complete  Palliative Care Screening Not Applicable Not Applicable  Skilled Nursing Facility Not Applicable Not Applicable

## 2023-08-22 ENCOUNTER — Other Ambulatory Visit (HOSPITAL_COMMUNITY): Payer: Self-pay

## 2023-08-22 ENCOUNTER — Ambulatory Visit: Payer: Self-pay

## 2023-08-22 ENCOUNTER — Other Ambulatory Visit: Payer: Self-pay

## 2023-08-22 NOTE — Telephone Encounter (Signed)
Pt wants to speak to a nurse, says she has clinical questions.   Best contact: [phone]     Chief Complaint: Asking if oxycodone prescription is at pharmacy. Instructed it was sent to [facility]. Pt. Given pharmacy phone number. Symptoms: n/a Frequency: n/a Pertinent Negatives: Patient denies n/a Disposition: [] ED /[] Urgent Care (no appt availability in office) / [] Appointment(In office/virtual)/ []  [place] [facility]/ [x] Home Care/ [] Refused Recommended Disposition /[] [place] [facility]/ []  Follow-up with PCP Additional Notes: Pt. Will call pharmacy.  Reason for Disposition  Prescription request for new medicine (not a refill)  Answer Assessment - Initial Assessment Questions 1. DRUG NAME: "What medicine do you need to have refilled?"     Oxycodone 2. REFILLS REMAINING: "How many refills are remaining?" (Note: The label on the medicine or pill bottle will show how many refills are remaining. If there are no refills remaining, then a renewal may be needed.)     Unsure 3. EXPIRATION DATE: "What is the expiration date?" (Note: The label states when the prescription will expire, and thus can no longer be refilled.)     N/a 4. PRESCRIBING HCP: "Who prescribed it?" Reason: If prescribed by specialist, call should be referred to that group.     ED 5. SYMPTOMS: "Do you have any symptoms?"     N/a 6. PREGNANCY: "Is there any chance that you are pregnant?" "When was your last menstrual period?"     No  Protocols used: Medication Refill and Renewal Call-A-AH

## 2023-08-23 ENCOUNTER — Other Ambulatory Visit (HOSPITAL_COMMUNITY): Payer: Self-pay

## 2023-08-26 ENCOUNTER — Other Ambulatory Visit: Payer: Self-pay | Admitting: Orthopedic Surgery

## 2023-09-02 ENCOUNTER — Emergency Department (HOSPITAL_COMMUNITY)
Admission: EM | Admit: 2023-09-02 | Discharge: 2023-09-03 | Disposition: A | Discharge status: Home / Self Care | Payer: Medicaid Other | Attending: Emergency Medicine | Admitting: Emergency Medicine

## 2023-09-02 ENCOUNTER — Encounter: Payer: Self-pay | Admitting: *Deleted

## 2023-09-02 DIAGNOSIS — Z20822 Contact with and (suspected) exposure to covid-19: Secondary | ICD-10-CM | POA: Insufficient documentation

## 2023-09-02 DIAGNOSIS — R1915 Other abnormal bowel sounds: Secondary | ICD-10-CM | POA: Insufficient documentation

## 2023-09-02 DIAGNOSIS — R1084 Generalized abdominal pain: Secondary | ICD-10-CM | POA: Diagnosis not present

## 2023-09-02 DIAGNOSIS — R112 Nausea with vomiting, unspecified: Secondary | ICD-10-CM | POA: Diagnosis present

## 2023-09-02 DIAGNOSIS — R000 Tachycardia, unspecified: Secondary | ICD-10-CM | POA: Diagnosis not present

## 2023-09-02 DIAGNOSIS — D72829 Elevated white blood cell count, unspecified: Secondary | ICD-10-CM | POA: Insufficient documentation

## 2023-09-02 DIAGNOSIS — R0989 Other specified symptoms and signs involving the circulatory and respiratory systems: Secondary | ICD-10-CM | POA: Diagnosis not present

## 2023-09-02 DIAGNOSIS — R059 Cough, unspecified: Secondary | ICD-10-CM | POA: Diagnosis not present

## 2023-09-02 DIAGNOSIS — R197 Diarrhea, unspecified: Secondary | ICD-10-CM | POA: Insufficient documentation

## 2023-09-02 DIAGNOSIS — E876 Hypokalemia: Secondary | ICD-10-CM | POA: Insufficient documentation

## 2023-09-02 DIAGNOSIS — Z7951 Long term (current) use of inhaled steroids: Secondary | ICD-10-CM | POA: Insufficient documentation

## 2023-09-02 DIAGNOSIS — J449 Chronic obstructive pulmonary disease, unspecified: Secondary | ICD-10-CM | POA: Insufficient documentation

## 2023-09-02 DIAGNOSIS — R509 Fever, unspecified: Secondary | ICD-10-CM | POA: Diagnosis not present

## 2023-09-02 LAB — CBC
HCT: 45.9 % (ref 36.0–46.0)
Hemoglobin: 15.6 g/dL — ABNORMAL HIGH (ref 12.0–15.0)
MCH: 33.8 pg (ref 26.0–34.0)
MCHC: 34 g/dL (ref 30.0–36.0)
MCV: 99.6 fL (ref 80.0–100.0)
Platelets: 348 10*3/uL (ref 150–400)
RBC: 4.61 MIL/uL (ref 3.87–5.11)
RDW: 13.2 % (ref 11.5–15.5)
WBC: 15.8 10*3/uL — ABNORMAL HIGH (ref 4.0–10.5)
nRBC: 0 % (ref 0.0–0.2)

## 2023-09-02 LAB — COMPREHENSIVE METABOLIC PANEL
ALT: 17 U/L (ref 0–44)
AST: 18 U/L (ref 15–41)
Albumin: 4.1 g/dL (ref 3.5–5.0)
Alkaline Phosphatase: 92 U/L (ref 38–126)
Anion gap: 9 (ref 5–15)
BUN: 14 mg/dL (ref 6–20)
CO2: 26 mmol/L (ref 22–32)
Calcium: 8.8 mg/dL — ABNORMAL LOW (ref 8.9–10.3)
Chloride: 103 mmol/L (ref 98–111)
Creatinine, Ser: 0.37 mg/dL — ABNORMAL LOW (ref 0.44–1.00)
GFR, Estimated: >60 mL/min (ref >60)
Glucose, Bld: 125 mg/dL — ABNORMAL HIGH (ref 70–99)
Potassium: 3.3 mmol/L — ABNORMAL LOW (ref 3.5–5.1)
Sodium: 138 mmol/L (ref 135–145)
Total Bilirubin: 0.7 mg/dL (ref 0.0–1.2)
Total Protein: 7.1 g/dL (ref 6.5–8.1)

## 2023-09-02 LAB — LIPASE, BLOOD: Lipase: 36 U/L (ref 11–51)

## 2023-09-02 NOTE — ED Triage Notes (Signed)
 Pt BIB EMS, c/o NV/D x 2 days, a/o x4. With a cough , hx of COPD , rhonic in bilateral lower fields per EMS
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
## 2023-09-03 ENCOUNTER — Emergency Department (HOSPITAL_COMMUNITY): Payer: Medicaid Other

## 2023-09-03 LAB — RESP PANEL BY RT-PCR (RSV, FLU A&B, COVID)  RVPGX2
Influenza A by PCR: NEGATIVE
Influenza B by PCR: NEGATIVE
Resp Syncytial Virus by PCR: NEGATIVE
SARS Coronavirus 2 by RT PCR: NEGATIVE

## 2023-09-03 MED ORDER — ONDANSETRON HCL 4 MG PO TABS
4.0000 mg | ORAL_TABLET | Freq: Three times a day (TID) | ORAL | 0 refills | Status: DC | PRN
  Filled 2023-09-03: qty 12, 4d supply, fill #0

## 2023-09-03 MED ORDER — ACETAMINOPHEN 325 MG PO TABS
650.0000 mg | ORAL_TABLET | Freq: Once | ORAL | Status: AC
  Administered 2023-09-03: 650 mg via ORAL
  Filled 2023-09-03: qty 2

## 2023-09-03 MED ORDER — ONDANSETRON HCL 4 MG PO TABS: 4.0000 mg | ORAL_TABLET | Freq: Three times a day (TID) | ORAL | 0 refills | Status: DC | PRN

## 2023-09-03 MED ORDER — ONDANSETRON HCL 4 MG PO TABS: 4.0000 mg | ORAL_TABLET | Freq: Three times a day (TID) | ORAL | Status: DC | PRN

## 2023-09-03 MED ORDER — ONDANSETRON HCL 4 MG/2ML IJ SOLN
4.0000 mg | Freq: Once | INTRAMUSCULAR | Status: AC
  Administered 2023-09-03: 4 mg via INTRAVENOUS
  Filled 2023-09-03: qty 2

## 2023-09-03 MED ORDER — IOHEXOL 300 MG/ML  SOLN
100.0000 mL | Freq: Once | INTRAMUSCULAR | Status: AC | PRN
  Administered 2023-09-03: 80 mL via INTRAVENOUS

## 2023-09-03 MED ORDER — ONDANSETRON 8 MG PO TBDP
16.0000 mg | ORAL_TABLET | Freq: Once | ORAL | Status: DC
Start: 2023-09-03 — End: 2023-09-03

## 2023-09-03 MED ORDER — MORPHINE SULFATE (PF) 4 MG/ML IV SOLN
4.0000 mg | Freq: Once | INTRAVENOUS | Status: AC
Start: 2023-09-03 — End: 2023-09-03
  Administered 2023-09-03: 4 mg via INTRAVENOUS
  Filled 2023-09-03: qty 1

## 2023-09-03 NOTE — Discharge Instructions (Addendum)
 Today you are seen for nausea, vomiting, and diarrhea.  Please pick up your Zofran  and take as needed for nausea/vomiting.  Please follow-up with your PCP if your symptoms persist for further evaluation and treatment.  Thank you for letting us  treat you today. After reviewing your labs and imaging, I feel you are safe to go home. Please follow up with your PCP in the next several days and provide them with your records from this visit. Return to the Emergency Room if pain becomes severe or symptoms worsen.

## 2023-09-03 NOTE — ED Notes (Signed)
 Aware we need a urine sample, offered cup and she stated she could not pee.

## 2023-09-03 NOTE — ED Notes (Signed)
 Pt has been given sandwich and juice for food and po challenge

## 2023-09-03 NOTE — ED Notes (Signed)
 Pt providing multiple excuses for not obtaining a urine sample. She has been made aware multiple times without her following through.

## 2023-09-03 NOTE — ED Notes (Signed)
 Tolerated food and po challenge well

## 2023-09-03 NOTE — ED Provider Notes (Addendum)
 [place] EMERGENCY DEPARTMENT AT [facility] Provider Note   CSN: [id number] Arrival date & time: 09/02/23  2213     History  Chief Complaint  Patient presents with   Emesis    Stacy Bolton is a 53 y.o. female past medical history significant for COPD presents today for nausea, vomiting, diarrhea x 2 days.  Patient also endorses is a cough and congestion which is intermittent given her history of COPD.  Patient also endorses fever, chills, and diffuse abdominal pain.  Patient denies shortness of breath, chest pain, blood in stool, urinary symptoms, weakness, or any other complaints at this time.   Emesis Associated symptoms: abdominal pain, chills, cough, diarrhea and fever        Home Medications Prior to Admission medications   Medication Sig Start Date End Date Taking? Authorizing Provider  ondansetron  (ZOFRAN ) 4 MG tablet Take 1 tablet (4 mg total) by mouth every 8 (eight) hours as needed for nausea or vomiting. 09/03/23  Yes Jaeliana Lococo N, PA-C  acetaminophen  (TYLENOL ) 500 MG tablet Take 1,000 mg by mouth as needed for mild pain (pain score 1-3) or moderate pain (pain score 4-6).    [provider]  albuterol  (PROVENTIL  HFA) 108 (90 Base) MCG/ACT inhaler Inhale 2 puffs into the lungs every 6 (six) hours as needed for wheezing or shortness of breath. 08/17/23   Gonfa, Taye T, MD  budesonide -formoterol  (SYMBICORT ) 160-4.5 MCG/ACT inhaler Inhale 2 puffs into the lungs in the morning and at bedtime. 08/17/23   Gonfa, Taye T, MD  cetirizine  (ZYRTEC  ALLERGY) 10 MG tablet Take 1 tablet (10 mg total) by mouth daily for 10 days. Patient not taking: Reported on 08/07/2023 05/03/23 05/13/23  Sherrill Cable Latif, DO  feeding supplement (ENSURE ENLIVE / ENSURE PLUS) LIQD Take 237 mLs by mouth 3 (three) times daily between meals. Patient not taking: Reported on 08/07/2023 05/03/23   Sherrill Cable Latif, DO  fluticasone  (FLONASE ) 50 MCG/ACT nasal spray Place 2 sprays  into both nostrils daily. Patient not taking: Reported on 08/07/2023 05/03/23   Sherrill Cable Latif, DO  levothyroxine  (SYNTHROID ) 25 MCG tablet Take 1 tablet (25 mcg total) by mouth daily before breakfast. 08/17/23   Gonfa, Taye T, MD  Mouthwashes (MOUTH RINSE) LIQD solution 15 mLs by Mouth Rinse route 4 (four) times daily. Patient not taking: Reported on 08/07/2023 05/03/23   Sheikh, Omair Latif, DO  Multiple Vitamin (MULTIVITAMIN WITH MINERALS) TABS tablet Take 1 tablet by mouth daily. Patient not taking: Reported on 08/07/2023 05/03/23   Sherrill Cable Latif, DO  nicotine  (NICODERM CQ  - DOSED IN MG/24 HOURS) 21 mg/24hr patch Place 1 patch (21 mg total) onto the skin daily. Patient not taking: Reported on 08/07/2023 05/03/23   Sherrill Cable Latif, DO  nicotine  polacrilex (NICORETTE ) 2 MG gum Take 1 each (2 mg total) by mouth as needed for smoking cessation. Patient not taking: Reported on 08/07/2023 05/03/23   Sherrill Cable Latif, DO  oxyCODONE  (ROXICODONE ) 5 MG immediate release tablet Take 1 tablet (5 mg total) by mouth every 6 (six) hours as needed for severe pain (pain score 7-10). 08/02/23   Thong Feeny Sor, PA-C  psyllium (HYDROCIL/METAMUCIL) 95 % PACK Take 1 packet by mouth daily. Patient not taking: Reported on 08/07/2023 02/09/23   Clapacs, Norleen DASEN, MD  sodium chloride  (OCEAN) 0.65 % SOLN nasal spray Place 1 spray into both nostrils as needed for congestion (nose irritation). Patient not taking: Reported on 08/07/2023 05/03/23   Sherrill Cable Donovan,  DO  traMADol  (ULTRAM ) 50 MG tablet Take 50 mg by mouth every 6 (six) hours as needed for moderate pain (pain score 4-6) or severe pain (pain score 7-10). 07/30/23   [provider]  traZODone  (DESYREL ) 50 MG tablet Take 1 tablet (50 mg total) by mouth at bedtime as needed for sleep. Patient not taking: Reported on 08/07/2023 05/03/23   Sherrill Cable Latif, DO      Allergies    Hydrocodone -acetaminophen , Hydromorphone hcl, and Oxycodone -acetaminophen      Review of Systems   Review of Systems  Constitutional:  Positive for chills and fever.  HENT:  Positive for congestion.   Respiratory:  Positive for cough.   Gastrointestinal:  Positive for abdominal pain, diarrhea, nausea and vomiting.    Physical Exam Updated Vital Signs BP 116/72   Pulse 81   Temp 98.6 F (37 C) (Oral)   Resp 20   LMP 10/13/2011   SpO2 95%  Physical Exam Vitals and nursing note reviewed.  Constitutional:      General: She is not in acute distress.    Appearance: She is well-developed.  HENT:     Head: Normocephalic and atraumatic.     Right Ear: External ear normal.     Left Ear: External ear normal.     Nose: Congestion present.     Mouth/Throat:     Mouth: Mucous membranes are moist.  Eyes:     Extraocular Movements: Extraocular movements intact.     Conjunctiva/sclera: Conjunctivae normal.  Cardiovascular:     Rate and Rhythm: Regular rhythm. Tachycardia present.     Pulses: Normal pulses.     Heart sounds: Normal heart sounds. No murmur heard. Pulmonary:     Effort: Pulmonary effort is normal. No respiratory distress.     Breath sounds: Rhonchi present.  Abdominal:     General: Abdomen is flat. Bowel sounds are increased.     Palpations: Abdomen is soft.     Tenderness: There is generalized abdominal tenderness. There is no right CVA tenderness, left CVA tenderness or rebound. Negative signs include Murphy's sign, Rovsing's sign and McBurney's sign.  Musculoskeletal:        General: No swelling. Normal range of motion.     Cervical back: Normal range of motion and neck supple.  Skin:    General: Skin is warm and dry.     Capillary Refill: Capillary refill takes less than 2 seconds.  Neurological:     General: No focal deficit present.     Mental Status: She is alert.     Motor: No weakness.  Psychiatric:        Mood and Affect: Mood normal.     ED Results / Procedures / Treatments   Labs (all labs ordered are listed, but only  abnormal results are displayed) Labs Reviewed  COMPREHENSIVE METABOLIC PANEL - Abnormal; Notable for the following components:      Result Value   Potassium 3.3 (*)    Glucose, Bld 125 (*)    Creatinine, Ser 0.37 (*)    Calcium 8.8 (*)    All other components within normal limits  CBC - Abnormal; Notable for the following components:   WBC 15.8 (*)    Hemoglobin 15.6 (*)    All other components within normal limits  RESP PANEL BY RT-PCR (RSV, FLU A&B, COVID)  RVPGX2  LIPASE, BLOOD  URINALYSIS, ROUTINE W REFLEX MICROSCOPIC    EKG None  Radiology CT ABDOMEN PELVIS W CONTRAST Result Date:  09/03/2023 CLINICAL DATA:  Acute abdominal pain, cough EXAM: CT ABDOMEN AND PELVIS WITH CONTRAST TECHNIQUE: Multidetector CT imaging of the abdomen and pelvis was performed using the standard protocol following bolus administration of intravenous contrast. RADIATION DOSE REDUCTION: This exam was performed according to the departmental dose-optimization program which includes automated exposure control, adjustment of the mA and/or kV according to patient size and/or use of iterative reconstruction technique. CONTRAST:  80mL OMNIPAQUE  IOHEXOL  300 MG/ML  SOLN COMPARISON:  03/19/2010 FINDINGS: Lower chest: Pulmonary emphysema. Focal bandlike airspace consolidation, atelectasis or scarring in the posterior basal segment left lower lobe. No pleural or pericardial effusion. Hepatobiliary: No focal liver abnormality is seen. Status post cholecystectomy. No biliary dilatation. Pancreas: Unremarkable. No pancreatic ductal dilatation or surrounding inflammatory changes. Spleen: Normal in size without focal abnormality. Adrenals/Urinary Tract: Adrenal glands are unremarkable. Kidneys are normal, without renal calculi, focal lesion, or hydronephrosis. Bladder is nondistended. Stomach/Bowel: Stomach decompressed. Small bowel nondilated. Clip near the base of the cecum. Appendix not identified. The colon is partially distended,  without acute finding. Vascular/Lymphatic: No significant vascular findings are present. No enlarged abdominal or pelvic lymph nodes. Reproductive: Uterus and bilateral adnexa are unremarkable. Other: Pelvic phleboliths.  No ascites.  No free air. Musculoskeletal: Small anterior endplate spurring at all levels in the lumbar spine. 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 IMPRESSION: 1. No acute abdominal findings. 2. Left lower lobe airspace consolidation, atelectasis or scarring. 3.  Emphysema (ICD10-J43.9). Electronically Signed   By: JONETTA Faes M.D.   On: 09/03/2023 16:59   DG Chest 2 View Result Date: 09/03/2023 CLINICAL DATA:  Cough. EXAM: CHEST - 2 VIEW COMPARISON:  Chest radiograph dated 08/06/2023. FINDINGS: Background of emphysema. No focal consolidation, pleural effusion, or pneumothorax. The cardiac silhouette is within normal limits. No acute osseous pathology. Degenerative changes of the spine. IMPRESSION: 1. No active cardiopulmonary disease. 2. Emphysema. Electronically Signed   By: Vanetta Chou M.D.   On: 09/03/2023 15:35    Procedures Procedures    Medications Ordered in ED Medications  ondansetron  (ZOFRAN -ODT) disintegrating tablet 16 mg (16 mg Oral Not Given 09/03/23 1822)  morphine  (PF) 4 MG/ML injection 4 mg (4 mg Intravenous Given 09/03/23 1600)  ondansetron  (ZOFRAN ) injection 4 mg (4 mg Intravenous Given 09/03/23 1600)  acetaminophen  (TYLENOL ) tablet 650 mg (650 mg Oral Given 09/03/23 1600)  iohexol  (OMNIPAQUE ) 300 MG/ML solution 100 mL (80 mLs Intravenous Contrast Given 09/03/23 1638)  ondansetron  (ZOFRAN ) injection 4 mg (4 mg Intravenous Given 09/03/23 1737)    ED Course/ Medical Decision Making/ A&P                                 Medical Decision Making Amount and/or Complexity of Data Reviewed Radiology: ordered.  Risk OTC drugs. Prescription drug management.   This patient presents to the ED with chief complaint(s) of nausea, vomiting,  diarrhea with pertinent past medical history of COPD which further complicates the presenting complaint. The complaint involves an extensive differential diagnosis and also carries with it a high risk of complications and morbidity.    The differential diagnosis includes diverticulitis, appendicitis, pancreatitis, GI illness  Additional history obtained: Records reviewed Primary Care Documents  ED Course and Reassessment: Patient given Tylenol , morphine , and Zofran .  Independent labs interpretation:  The following labs were independently interpreted:  CBC: Leukocytosis at 15.8 CMP: Mild hypokalemia 3.3, mildly decreased calcium Lipase: 36 UA: Patient unable to produce sample  Respiratory panel: Negative EKG: Sinus with RAE  Independent visualization of imaging: - I independently visualized the following imaging with scope of interpretation limited to determining acute life threatening conditions related to emergency care:  Chest x-ray: No active cardiopulmonary disease, emphysema CT abdomen pelvis with contrast: No acute abdominal findings  Consultation: - Consulted or discussed management/test interpretation w/ external professional: None  Consideration for admission or further workup: Considered for mission further workup however patient's vital signs have improved, physical exam, labs, and imaging were all reassuring.  Patient's symptoms likely due to GI illness.  Patient able to tolerate oral intake without issue prior to discharge.  Patient will be given 4 4mg  Zofran  as needed for nausea.  Patient should follow-up with PCP if symptoms persist for further evaluation and workup.        Final Clinical Impression(s) / ED Diagnoses Final diagnoses:  Nausea vomiting and diarrhea    Rx / DC Orders ED Discharge Orders          Ordered    ondansetron  (ZOFRAN ) 4 MG tablet  Every 8 hours PRN,   Status:  Discontinued        09/03/23 1804    ondansetron  (ZOFRAN ) 4 MG tablet   Every 8 hours PRN,   Status:  Discontinued        09/03/23 1815    ondansetron  (ZOFRAN ) 4 MG tablet  Every 8 hours PRN        09/03/23 1835              Francis Ileana SAILOR, PA-C 09/03/23 1804    Francis Ileana SAILOR, PA-C 09/03/23 1835    Patt Alm Macho, MD 09/05/23 1620

## 2023-09-04 ENCOUNTER — Ambulatory Visit: Payer: Self-pay

## 2023-09-04 ENCOUNTER — Other Ambulatory Visit (HOSPITAL_COMMUNITY): Payer: Self-pay

## 2023-09-04 ENCOUNTER — Encounter: Payer: Self-pay | Admitting: Physician Assistant

## 2023-09-04 NOTE — Telephone Encounter (Signed)
  Chief Complaint: advice  Symptoms: doesn't have privacy where staying and needing assistance with bed hold while she get shower and rest  Frequency:  Pertinent Negatives: NA Disposition: [] ED /[] Urgent Care (no appt availability in office) / [] Appointment(In office/virtual)/ []  [facility]/ [] Home Care/ [] Refused Recommended Disposition /[] [place] [facility]/ [x]  Follow-up with PCP Additional Notes: pt is staying at [facility] and likes her bed but states she doesn't have any privacy. There are 35+ women there and 1 shower. Pt states she is always rushed and doesn't have privacy to take a shower. She says she has a prn note from ED for rest but they dont respect that and have to have PCP call and give orders. Pt advised that she has to be seen for Rosaline, NP for her to be considered PCP. Pt has NPA in 09/2023. Asked if pt had called [facility] for advice and she said she didn't know how to follow up with her. She doesn't want to go to another Shelter she just wants to be able to leave for 1-2 days to go get a shower and rest in peace. Advised pt I would send message to Rosaline, NP and [facility] for recommendations.   Reason for Disposition  [1] Caller requesting NON-URGENT health information AND [2] PCP's office is the best resource  Answer Assessment - Initial Assessment Questions 1. REASON FOR CALL or QUESTION: What is your reason for calling today? or How can I best help you? or What question do you have that I can help answer?     Pt is needing PCP to call [facility] and give orders to hold bed so she can go to [facility] for 1 day to get rest and privacy for shower.  Protocols used: Information Only Call - No Triage-A-AH

## 2023-09-04 NOTE — Progress Notes (Signed)
 Pt struggling w/ probable Norovirus.  She has had diarrhea, has had problems w/ rectal prolapse.   She is having a very hard time right now.   She wants to bathe and rest in privacy. She has soiled her underwear, but is uncomfortable changing them because of the lack of privacy (cameras in the bed area).   She has vomited once, but has had multiple episodes of diarrhea.  Her L wrist is broken, needs surgery. She fell on 12/02, originally scheduled for surgery 12/19, but got PNA and is to f/u with Orthopedics.   Her PCP is to clear her for surgery, cannot schedule it until that happens.  She requests a hotel room, no because she is no longer contagious.   Requests that her room be held for a night, she has a friend to pay for a hotel.   [facility] says they would not be able to hold her bed because so many women are looking for a place to stay.   Pt very upset and crying, after talking to her for a while, she got some better.   Shona Shad, PA-C 09/04/2023 4:46 PM

## 2023-09-06 NOTE — Telephone Encounter (Signed)
 Will forward to Case manager and provider. PT has a new pt appt with michelle on 850am

## 2023-09-09 NOTE — Telephone Encounter (Signed)
 Noted.

## 2023-09-12 ENCOUNTER — Other Ambulatory Visit: Payer: Self-pay | Admitting: Physician Assistant

## 2023-09-12 NOTE — Progress Notes (Signed)
Pt comes in because rollator has collapsed several times and she has fallen.  On exam, rollator has a stripped screw that secures the handle on the L.  Wrist is still untreated, cannot wear the splint, because it causes pain so she doesn't wear it.  She was scheduled for surgery, but it was canceled because PCP needs to clear her (she was sick).  Has PCP appt 02/05.  Pt needs paperwork filled out for insurance, says Alcario Drought has not been available.   Meds reviewed, she has rx, OTCs were refilled for her.   She was given Tylenol, Flonase, Benadryl, and MVI.   Theodore Demark, PA-C 09/12/2023 10:12 AM

## 2023-09-13 ENCOUNTER — Ambulatory Visit: Admit: 2023-09-13 | Payer: MEDICAID | Admitting: Orthopedic Surgery

## 2023-09-13 SURGERY — OPEN REDUCTION INTERNAL FIXATION (ORIF) DISTAL RADIUS FRACTURE
Anesthesia: Choice | Site: Arm Lower | Laterality: Left

## 2023-09-15 ENCOUNTER — Observation Stay (HOSPITAL_COMMUNITY)
Admission: EM | Admit: 2023-09-15 | Discharge: 2023-09-17 | Disposition: A | Discharge status: Home / Self Care | Payer: Medicaid Other | Attending: Family Medicine | Admitting: Family Medicine

## 2023-09-15 ENCOUNTER — Other Ambulatory Visit: Payer: Self-pay

## 2023-09-15 ENCOUNTER — Encounter (HOSPITAL_COMMUNITY): Payer: Self-pay | Admitting: Internal Medicine

## 2023-09-15 ENCOUNTER — Emergency Department (HOSPITAL_COMMUNITY): Payer: Medicaid Other

## 2023-09-15 DIAGNOSIS — Z87891 Personal history of nicotine dependence: Secondary | ICD-10-CM | POA: Diagnosis not present

## 2023-09-15 DIAGNOSIS — Z79899 Other long term (current) drug therapy: Secondary | ICD-10-CM | POA: Insufficient documentation

## 2023-09-15 DIAGNOSIS — E039 Hypothyroidism, unspecified: Secondary | ICD-10-CM | POA: Insufficient documentation

## 2023-09-15 DIAGNOSIS — J9611 Chronic respiratory failure with hypoxia: Secondary | ICD-10-CM | POA: Insufficient documentation

## 2023-09-15 DIAGNOSIS — R739 Hyperglycemia, unspecified: Secondary | ICD-10-CM | POA: Insufficient documentation

## 2023-09-15 DIAGNOSIS — R0602 Shortness of breath: Secondary | ICD-10-CM | POA: Diagnosis present

## 2023-09-15 DIAGNOSIS — Z20822 Contact with and (suspected) exposure to covid-19: Secondary | ICD-10-CM | POA: Diagnosis not present

## 2023-09-15 DIAGNOSIS — J449 Chronic obstructive pulmonary disease, unspecified: Secondary | ICD-10-CM | POA: Diagnosis present

## 2023-09-15 DIAGNOSIS — Z8781 Personal history of (healed) traumatic fracture: Secondary | ICD-10-CM

## 2023-09-15 DIAGNOSIS — E876 Hypokalemia: Secondary | ICD-10-CM | POA: Diagnosis not present

## 2023-09-15 DIAGNOSIS — F411 Generalized anxiety disorder: Secondary | ICD-10-CM | POA: Diagnosis present

## 2023-09-15 DIAGNOSIS — J441 Chronic obstructive pulmonary disease with (acute) exacerbation: Principal | ICD-10-CM | POA: Insufficient documentation

## 2023-09-15 DIAGNOSIS — F1990 Other psychoactive substance use, unspecified, uncomplicated: Secondary | ICD-10-CM | POA: Diagnosis present

## 2023-09-15 DIAGNOSIS — F17210 Nicotine dependence, cigarettes, uncomplicated: Secondary | ICD-10-CM | POA: Insufficient documentation

## 2023-09-15 DIAGNOSIS — T380X5A Adverse effect of glucocorticoids and synthetic analogues, initial encounter: Secondary | ICD-10-CM | POA: Insufficient documentation

## 2023-09-15 LAB — TSH: TSH: 1.307 u[IU]/mL (ref 0.350–4.500)

## 2023-09-15 LAB — CBC WITH DIFFERENTIAL/PLATELET
Abs Immature Granulocytes: 0.01 10*3/uL (ref 0.00–0.07)
Basophils Absolute: 0 10*3/uL (ref 0.0–0.1)
Basophils Relative: 0 %
Eosinophils Absolute: 0 10*3/uL (ref 0.0–0.5)
Eosinophils Relative: 0 %
HCT: 42.8 % (ref 36.0–46.0)
Hemoglobin: 13.5 g/dL (ref 12.0–15.0)
Immature Granulocytes: 0 %
Lymphocytes Relative: 25 %
Lymphs Abs: 1.1 10*3/uL (ref 0.7–4.0)
MCH: 31.7 pg (ref 26.0–34.0)
MCHC: 31.5 g/dL (ref 30.0–36.0)
MCV: 100.5 fL — ABNORMAL HIGH (ref 80.0–100.0)
Monocytes Absolute: 0.1 10*3/uL (ref 0.1–1.0)
Monocytes Relative: 2 %
Neutro Abs: 3 10*3/uL (ref 1.7–7.7)
Neutrophils Relative %: 73 %
Platelets: 329 10*3/uL (ref 150–400)
RBC: 4.26 MIL/uL (ref 3.87–5.11)
RDW: 13.3 % (ref 11.5–15.5)
WBC: 4.2 10*3/uL (ref 4.0–10.5)
nRBC: 0 % (ref 0.0–0.2)

## 2023-09-15 LAB — RESP PANEL BY RT-PCR (RSV, FLU A&B, COVID)  RVPGX2
Influenza A by PCR: NEGATIVE
Influenza B by PCR: NEGATIVE
Resp Syncytial Virus by PCR: NEGATIVE
SARS Coronavirus 2 by RT PCR: NEGATIVE

## 2023-09-15 LAB — BASIC METABOLIC PANEL
Anion gap: 13 (ref 5–15)
BUN: 11 mg/dL (ref 6–20)
CO2: 23 mmol/L (ref 22–32)
Calcium: 8 mg/dL — ABNORMAL LOW (ref 8.9–10.3)
Chloride: 107 mmol/L (ref 98–111)
Creatinine, Ser: 0.43 mg/dL — ABNORMAL LOW (ref 0.44–1.00)
GFR, Estimated: >60 mL/min (ref >60)
Glucose, Bld: 93 mg/dL (ref 70–99)
Potassium: 3.3 mmol/L — ABNORMAL LOW (ref 3.5–5.1)
Sodium: 143 mmol/L (ref 135–145)

## 2023-09-15 LAB — GLUCOSE, CAPILLARY: Glucose-Capillary: 291 mg/dL — ABNORMAL HIGH (ref 70–99)

## 2023-09-15 LAB — CBG MONITORING, ED: Glucose-Capillary: 302 mg/dL — ABNORMAL HIGH (ref 70–99)

## 2023-09-15 MED ORDER — POTASSIUM CHLORIDE CRYS ER 20 MEQ PO TBCR
40.0000 meq | EXTENDED_RELEASE_TABLET | ORAL | Status: AC
  Administered 2023-09-15: 40 meq via ORAL
  Filled 2023-09-15: qty 2

## 2023-09-15 MED ORDER — IPRATROPIUM BROMIDE 0.02 % IN SOLN
0.5000 mg | Freq: Four times a day (QID) | RESPIRATORY_TRACT | Status: DC
  Administered 2023-09-16 – 2023-09-17 (×6): 0.5 mg via RESPIRATORY_TRACT
  Filled 2023-09-15 (×6): qty 2.5

## 2023-09-15 MED ORDER — NICOTINE 21 MG/24HR TD PT24
21.0000 mg | MEDICATED_PATCH | Freq: Every day | TRANSDERMAL | Status: DC
  Administered 2023-09-15 – 2023-09-17 (×3): 21 mg via TRANSDERMAL
  Filled 2023-09-15 (×3): qty 1

## 2023-09-15 MED ORDER — ENSURE ENLIVE PO LIQD
237.0000 mL | Freq: Three times a day (TID) | ORAL | Status: DC
Start: 2023-09-16 — End: 2023-09-17
  Administered 2023-09-16 – 2023-09-17 (×4): 237 mL via ORAL

## 2023-09-15 MED ORDER — SODIUM CHLORIDE 0.9 % IV SOLN
250.0000 mL | INTRAVENOUS | Status: AC | PRN
Start: 2023-09-15 — End: 2023-09-16

## 2023-09-15 MED ORDER — IPRATROPIUM-ALBUTEROL 0.5-2.5 (3) MG/3ML IN SOLN
3.0000 mL | Freq: Once | RESPIRATORY_TRACT | Status: AC
Start: 2023-09-15 — End: 2023-09-15
  Administered 2023-09-15: 3 mL via RESPIRATORY_TRACT
  Filled 2023-09-15: qty 3

## 2023-09-15 MED ORDER — METHYLPREDNISOLONE SODIUM SUCC 125 MG IJ SOLR: 60.0000 mg | INTRAMUSCULAR | Status: DC

## 2023-09-15 MED ORDER — METHYLPREDNISOLONE SODIUM SUCC 125 MG IJ SOLR
125.0000 mg | Freq: Once | INTRAMUSCULAR | Status: AC
  Administered 2023-09-15: 125 mg via INTRAVENOUS
  Filled 2023-09-15: qty 2

## 2023-09-15 MED ORDER — IBUPROFEN 200 MG PO TABS: 400.0000 mg | ORAL_TABLET | Freq: Four times a day (QID) | ORAL | Status: DC | PRN

## 2023-09-15 MED ORDER — BISACODYL 5 MG PO TBEC: 5.0000 mg | DELAYED_RELEASE_TABLET | Freq: Every day | ORAL | Status: DC | PRN

## 2023-09-15 MED ORDER — IPRATROPIUM-ALBUTEROL 0.5-2.5 (3) MG/3ML IN SOLN: 3.0000 mL | RESPIRATORY_TRACT | Status: DC | PRN

## 2023-09-15 MED ORDER — ENOXAPARIN SODIUM 40 MG/0.4ML IJ SOSY
40.0000 mg | PREFILLED_SYRINGE | INTRAMUSCULAR | Status: DC
  Administered 2023-09-16 – 2023-09-17 (×2): 40 mg via SUBCUTANEOUS
  Filled 2023-09-15 (×2): qty 0.4

## 2023-09-15 MED ORDER — METHYLPREDNISOLONE SODIUM SUCC 40 MG IJ SOLR
40.0000 mg | Freq: Two times a day (BID) | INTRAMUSCULAR | Status: DC
  Administered 2023-09-16: 40 mg via INTRAVENOUS
  Filled 2023-09-15: qty 1

## 2023-09-15 MED ORDER — INSULIN ASPART 100 UNIT/ML IJ SOLN
0.0000 [IU] | Freq: Four times a day (QID) | INTRAMUSCULAR | Status: DC
  Administered 2023-09-15: 3 [IU] via SUBCUTANEOUS
  Administered 2023-09-16: 1 [IU] via SUBCUTANEOUS
  Filled 2023-09-15: qty 0.06

## 2023-09-15 MED ORDER — ALBUTEROL SULFATE (2.5 MG/3ML) 0.083% IN NEBU
2.5000 mg | INHALATION_SOLUTION | Freq: Once | RESPIRATORY_TRACT | Status: AC
  Administered 2023-09-15: 2.5 mg via RESPIRATORY_TRACT
  Filled 2023-09-15: qty 3

## 2023-09-15 MED ORDER — SODIUM CHLORIDE 0.9% FLUSH
3.0000 mL | Freq: Two times a day (BID) | INTRAVENOUS | Status: DC
  Administered 2023-09-17: 3 mL via INTRAVENOUS

## 2023-09-15 MED ORDER — LEVALBUTEROL HCL 0.63 MG/3ML IN NEBU
0.6300 mg | INHALATION_SOLUTION | Freq: Four times a day (QID) | RESPIRATORY_TRACT | Status: DC
  Administered 2023-09-16 – 2023-09-17 (×6): 0.63 mg via RESPIRATORY_TRACT
  Filled 2023-09-15 (×6): qty 3

## 2023-09-15 MED ORDER — TRAMADOL HCL 50 MG PO TABS
50.0000 mg | ORAL_TABLET | Freq: Four times a day (QID) | ORAL | Status: DC | PRN
  Administered 2023-09-16 (×3): 50 mg via ORAL
  Filled 2023-09-15 (×3): qty 1

## 2023-09-15 MED ORDER — GUAIFENESIN ER 600 MG PO TB12
600.0000 mg | ORAL_TABLET | Freq: Two times a day (BID) | ORAL | Status: DC
  Administered 2023-09-15 – 2023-09-17 (×4): 600 mg via ORAL
  Filled 2023-09-15 (×4): qty 1

## 2023-09-15 MED ORDER — MORPHINE SULFATE (PF) 4 MG/ML IV SOLN
4.0000 mg | Freq: Once | INTRAVENOUS | Status: AC
  Administered 2023-09-15: 4 mg via INTRAVENOUS
  Filled 2023-09-15: qty 1

## 2023-09-15 MED ORDER — SODIUM CHLORIDE 0.9 % IV SOLN
500.0000 mg | INTRAVENOUS | Status: DC
  Administered 2023-09-15: 500 mg via INTRAVENOUS
  Filled 2023-09-15: qty 5

## 2023-09-15 MED ORDER — PANTOPRAZOLE SODIUM 40 MG PO TBEC
40.0000 mg | DELAYED_RELEASE_TABLET | Freq: Every day | ORAL | Status: DC
Start: 2023-09-16 — End: 2023-09-17
  Administered 2023-09-16 – 2023-09-17 (×2): 40 mg via ORAL
  Filled 2023-09-15 (×2): qty 1

## 2023-09-15 MED ORDER — MAGNESIUM SULFATE 2 GM/50ML IV SOLN
2.0000 g | Freq: Once | INTRAVENOUS | Status: AC
Start: 2023-09-15 — End: 2023-09-15
  Administered 2023-09-15: 2 g via INTRAVENOUS
  Filled 2023-09-15: qty 50

## 2023-09-15 MED ORDER — SODIUM CHLORIDE 0.9% FLUSH: 3.0000 mL | INTRAVENOUS | Status: DC | PRN

## 2023-09-15 MED ORDER — INSULIN ASPART 100 UNIT/ML IJ SOLN
0.0000 [IU] | Freq: Four times a day (QID) | INTRAMUSCULAR | Status: DC | PRN
  Filled 2023-09-15: qty 0.06

## 2023-09-15 MED ORDER — SODIUM CHLORIDE 0.9% FLUSH
3.0000 mL | Freq: Two times a day (BID) | INTRAVENOUS | Status: DC
  Administered 2023-09-16 – 2023-09-17 (×3): 3 mL via INTRAVENOUS

## 2023-09-15 NOTE — ED Triage Notes (Signed)
Patient brought in by EMS from a friend house with c/o SOB. Per EMS patient has HX of COPD and had Pneumonia x1 month ago. She noticed last night she had increased SOB used her inhaler with no relief. She has rhonchi and wheezing in both lungs and producing clear sputum. EMS started a 20g in R forearm and gave 125 of Solu-Medrol, 10mg  of Albuterol, 1mg  of Atrovent and 4mg  of Zofran. Patient fell 1 month ago and injured L wrist and did not receive any medical care she also fell last night and c/o back pain.  108/75 94 96% 8L 28

## 2023-09-15 NOTE — ED Notes (Signed)
PT at 88% RA RN applied 2L O2 via [place]

## 2023-09-15 NOTE — ED Notes (Signed)
ED TO INPATIENT HANDOFF REPORT  Name/Age/Gender Stacy Bolton 53 y.o. female  Code Status    Code Status Orders  (From admission, onward)           Start     Ordered   09/15/23 2100  Full code  Continuous       Question:  By:  Answer:  Consent: discussion documented in EHR   09/15/23 2100           Code Status History     Date Active Date Inactive Code Status Order ID Comments User Context   08/06/2023 1936 08/17/2023 1911 Full Code [id number]  Charlsie Quest, MD ED   04/28/2023 1503 05/04/2023 0058 Full Code [id number]  Almon Hercules, MD ED   01/31/2023 0928 02/08/2023 2220 Full Code [id number]  Bing Neighbors, NP Inpatient   01/31/2023 0928 01/31/2023 0928 Full Code [id number]  Mancel Bale, NP Inpatient   01/30/2023 1721 01/31/2023 0919 Full Code [id number]  Sunday Corn, NP ED       Home/SNF/Other Home  Chief Complaint COPD with acute exacerbation (HCC) [J44.1]  Level of Care/Admitting Diagnosis ED Disposition     ED Disposition  Admit   Condition  --   Comment  Hospital Area: [facility] [[id number]]  Level of Care: Telemetry [5]  Admit to tele based on following criteria: Other see comments  Comments: COPD exacerbation  May admit patient to [facility] or [facility] if equivalent level of care is available:: No  Covid Evaluation: Asymptomatic - no recent exposure (last 10 days) testing not required  Diagnosis: COPD with acute exacerbation [facility]) [[id number]]  Admitting Physician: Tereasa Coop [[id number]]  Attending Physician: Tereasa Coop [[id number]]  Certification:: I certify this patient will need inpatient services for at least 2 midnights  Expected Medical Readiness: 09/19/2023          Medical History Past Medical History:  Diagnosis Date   Anxiety    Asthma    COPD (chronic obstructive pulmonary disease) (HCC)    Dyspnea    Endometriosis    Seizure (HCC)     Allergies Allergies  Allergen Reactions    Hydrocodone-Acetaminophen Itching   Hydromorphone Hcl Itching   Oxycodone-Acetaminophen Itching    IV Location/Drains/Wounds Patient Lines/Drains/Airways Status     Active Line/Drains/Airways     Name Placement date Placement time Site Days   Peripheral IV 09/15/23 Anterior;Right Forearm 09/15/23  1430  Forearm  less than 1            Labs/Imaging Results for orders placed or performed during the hospital encounter of 09/15/23 (from the past 48 hours)  Basic metabolic panel     Status: Abnormal   Collection Time: 09/15/23  5:28 PM  Result Value Ref Range   Sodium 143 135 - 145 mmol/L   Potassium 3.3 (L) 3.5 - 5.1 mmol/L   Chloride 107 98 - 111 mmol/L   CO2 23 22 - 32 mmol/L   Glucose, Bld 93 70 - 99 mg/dL    Comment: Glucose reference range applies only to samples taken after fasting for at least 8 hours.   BUN 11 6 - 20 mg/dL   Creatinine, Ser 4.16 (L) 0.44 - 1.00 mg/dL   Calcium 8.0 (L) 8.9 - 10.3 mg/dL   GFR, Estimated >60 >63 mL/min    Comment: (NOTE) Calculated using the CKD-EPI Creatinine Equation (2021)    Anion gap 13 5 - 15    Comment: Performed at  [facility], [address] [place], [place] [address]  CBC with Differential/Platelet     Status: Abnormal   Collection Time: 09/15/23  6:39 PM  Result Value Ref Range   WBC 4.2 4.0 - 10.5 K/uL   RBC 4.26 3.87 - 5.11 MIL/uL   Hemoglobin 13.5 12.0 - 15.0 g/dL   HCT 60.4 54.0 - 98.1 %   MCV 100.5 (H) 80.0 - 100.0 fL   MCH 31.7 26.0 - 34.0 pg   MCHC 31.5 30.0 - 36.0 g/dL   RDW 19.1 47.8 - 29.5 %   Platelets 329 150 - 400 K/uL   nRBC 0.0 0.0 - 0.2 %   Neutrophils Relative % 73 %   Neutro Abs 3.0 1.7 - 7.7 K/uL   Lymphocytes Relative 25 %   Lymphs Abs 1.1 0.7 - 4.0 K/uL   Monocytes Relative 2 %   Monocytes Absolute 0.1 0.1 - 1.0 K/uL   Eosinophils Relative 0 %   Eosinophils Absolute 0.0 0.0 - 0.5 K/uL   Basophils Relative 0 %   Basophils Absolute 0.0 0.0 - 0.1 K/uL   Immature  Granulocytes 0 %   Abs Immature Granulocytes 0.01 0.00 - 0.07 K/uL    Comment: Performed at [facility], [address] [place], [place] [address]  Resp panel by RT-PCR (RSV, Flu A&B, Covid) Anterior Nasal Swab     Status: None   Collection Time: 09/15/23  8:43 PM   Specimen: Anterior Nasal Swab  Result Value Ref Range   SARS Coronavirus 2 by RT PCR NEGATIVE NEGATIVE    Comment: (NOTE) SARS-CoV-2 target nucleic acids are NOT DETECTED.  The SARS-CoV-2 RNA is generally detectable in upper respiratory specimens during the acute phase of infection. The lowest concentration of SARS-CoV-2 viral copies this assay can detect is 138 copies/mL. A negative result does not preclude SARS-Cov-2 infection and should not be used as the sole basis for treatment or other patient management decisions. A negative result may occur with  improper specimen collection/handling, submission of specimen other than nasopharyngeal swab, presence of viral mutation(s) within the areas targeted by this assay, and inadequate number of viral copies(<138 copies/mL). A negative result must be combined with clinical observations, patient history, and epidemiological information. The expected result is Negative.  Fact Sheet for Patients:  BloggerCourse.com  Fact Sheet for Healthcare Providers:  SeriousBroker.it  This test is no t yet approved or cleared by the [place] FDA and  has been authorized for detection and/or diagnosis of SARS-CoV-2 by FDA under an Emergency Use Authorization (EUA). This EUA will remain  in effect (meaning this test can be used) for the duration of the COVID-19 declaration under Section 564(b)(1) of the Act, 21 U.S.C.section 360bbb-3(b)(1), unless the authorization is terminated  or revoked sooner.       Influenza A by PCR NEGATIVE NEGATIVE   Influenza B by PCR NEGATIVE NEGATIVE    Comment: (NOTE) The Xpert  Xpress SARS-CoV-2/FLU/RSV plus assay is intended as an aid in the diagnosis of influenza from Nasopharyngeal swab specimens and should not be used as a sole basis for treatment. Nasal washings and aspirates are unacceptable for Xpert Xpress SARS-CoV-2/FLU/RSV testing.  Fact Sheet for Patients: BloggerCourse.com  Fact Sheet for Healthcare Providers: SeriousBroker.it  This test is not yet approved or cleared by the [place] FDA and has been authorized for detection and/or diagnosis of SARS-CoV-2 by FDA under an Emergency Use Authorization (EUA). This EUA will remain in effect (meaning  this test can be used) for the duration of the COVID-19 declaration under Section 564(b)(1) of the Act, 21 U.S.C. section 360bbb-3(b)(1), unless the authorization is terminated or revoked.     Resp Syncytial Virus by PCR NEGATIVE NEGATIVE    Comment: (NOTE) Fact Sheet for Patients: BloggerCourse.com  Fact Sheet for Healthcare Providers: SeriousBroker.it  This test is not yet approved or cleared by the [place] FDA and has been authorized for detection and/or diagnosis of SARS-CoV-2 by FDA under an Emergency Use Authorization (EUA). This EUA will remain in effect (meaning this test can be used) for the duration of the COVID-19 declaration under Section 564(b)(1) of the Act, 21 U.S.C. section 360bbb-3(b)(1), unless the authorization is terminated or revoked.  Performed at [facility], [address], [place], [place] [address]   CBG monitoring, ED     Status: Abnormal   Collection Time: 09/15/23  9:32 PM  Result Value Ref Range   Glucose-Capillary 302 (H) 70 - 99 mg/dL    Comment: Glucose reference range applies only to samples taken after fasting for at least 8 hours.   DG Wrist Complete Left Result Date: 09/15/2023 CLINICAL DATA:  Left wrist pain, recent fracture  EXAM: LEFT WRIST - COMPLETE 3+ VIEW COMPARISON:  X-ray 07/29/2023 FINDINGS: Subacute fractures of the distal radius and ulnar styloid. Prominent periosteal new bone formation at the distal radial fracture site. No significant change in alignment with persistent impaction and dorsal angulation. No definite callus formation at the ulnar styloid fracture site. No new fractures. No dislocation. Persistent soft tissue swelling about the wrist. IMPRESSION: Subacute fractures of the distal radius and ulnar styloid with no significant change in alignment. Electronically Signed   By: Duanne Guess D.O.   On: 09/15/2023 18:45   DG Lumbar Spine Complete Result Date: 09/15/2023 CLINICAL DATA:  Fall 1 month ago.  Low back pain EXAM: LUMBAR SPINE - COMPLETE 4+ VIEW COMPARISON:  04/11/2008 x-ray, 09/03/2023 CT FINDINGS: There is no evidence of lumbar spine fracture. Alignment is normal. Intervertebral disc spaces are maintained. IMPRESSION: Negative. Electronically Signed   By: Duanne Guess D.O.   On: 09/15/2023 18:43   DG Chest 2 View Result Date: 09/15/2023 CLINICAL DATA:  Shortness of breath EXAM: CHEST - 2 VIEW COMPARISON:  09/03/2023 FINDINGS: The heart size and mediastinal contours are within normal limits. Aortic atherosclerosis. Hyperinflated lungs. No new superimposed airspace consolidation. No pleural effusion or pneumothorax. The visualized skeletal structures are unremarkable. IMPRESSION: Hyperinflated lungs. No new superimposed airspace consolidation. Electronically Signed   By: Duanne Guess D.O.   On: 09/15/2023 18:41    Pending Labs Unresulted Labs (From admission, onward)     Start     Ordered   09/16/23 0500  Comprehensive metabolic panel  Tomorrow morning,   R        09/15/23 2100   09/16/23 0500  CBC  Tomorrow morning,   R        09/15/23 2100   09/15/23 2105  TSH  Add-on,   AD        09/15/23 2104   09/15/23 2105  T4, free  Add-on,   AD        09/15/23 2104   09/15/23 2052   Respiratory (~20 pathogens) panel by PCR  (Respiratory panel by PCR (~20 pathogens, ~24 hr TAT)  w precautions)  Add-on,   AD        09/15/23 2051   09/15/23 2032  Resp panel by RT-PCR (RSV,  Flu A&B, Covid) Anterior Nasal Swab  Once,   URGENT        09/15/23 2031   09/15/23 1707  CBC with Differential  Once,   STAT        09/15/23 1707            Vitals/Pain Today's Vitals   09/15/23 1706 09/15/23 1707 09/15/23 1830 09/15/23 2130  BP:    109/68  Pulse:    98  Resp:    15  Temp:    98.7 F (37.1 C)  TempSrc:    Oral  SpO2:    100%  Weight:  48.5 kg    Height:  5\' 5"  (1.651 m)    PainSc: 10-Worst pain ever  5      Isolation Precautions Droplet precaution  Medications Medications  ipratropium-albuterol (DUONEB) 0.5-2.5 (3) MG/3ML nebulizer solution 3 mL (has no administration in time range)  albuterol (PROVENTIL) (2.5 MG/3ML) 0.083% nebulizer solution 2.5 mg (has no administration in time range)  potassium chloride SA (KLOR-CON M) CR tablet 40 mEq (has no administration in time range)  traMADol (ULTRAM) tablet 50 mg (has no administration in time range)  enoxaparin (LOVENOX) injection 40 mg (has no administration in time range)  sodium chloride flush (NS) 0.9 % injection 3 mL (has no administration in time range)  sodium chloride flush (NS) 0.9 % injection 3 mL (has no administration in time range)  sodium chloride flush (NS) 0.9 % injection 3 mL (has no administration in time range)  0.9 %  sodium chloride infusion (has no administration in time range)  ibuprofen (ADVIL) tablet 400 mg (has no administration in time range)  bisacodyl (DULCOLAX) EC tablet 5 mg (has no administration in time range)  pantoprazole (PROTONIX) EC tablet 40 mg (has no administration in time range)  azithromycin (ZITHROMAX) 500 mg in sodium chloride 0.9 % 250 mL IVPB (has no administration in time range)  levalbuterol (XOPENEX) nebulizer solution 0.63 mg (has no administration in time range)   ipratropium (ATROVENT) nebulizer solution 0.5 mg (has no administration in time range)  ipratropium-albuterol (DUONEB) 0.5-2.5 (3) MG/3ML nebulizer solution 3 mL (has no administration in time range)  guaiFENesin (MUCINEX) 12 hr tablet 600 mg (has no administration in time range)  nicotine (NICODERM CQ - dosed in mg/24 hours) patch 21 mg (has no administration in time range)  feeding supplement (ENSURE ENLIVE / ENSURE PLUS) liquid 237 mL (has no administration in time range)  methylPREDNISolone sodium succinate (SOLU-MEDROL) 40 mg/mL injection 40 mg (has no administration in time range)  insulin aspart (novoLOG) injection 0-6 Units (has no administration in time range)  methylPREDNISolone sodium succinate (SOLU-MEDROL) 125 mg/2 mL injection 125 mg (125 mg Intravenous Given 09/15/23 1732)  magnesium sulfate IVPB 2 g 50 mL (0 g Intravenous Stopped 09/15/23 1859)  ipratropium-albuterol (DUONEB) 0.5-2.5 (3) MG/3ML nebulizer solution 3 mL (3 mLs Nebulization Given 09/15/23 1729)  albuterol (PROVENTIL) (2.5 MG/3ML) 0.083% nebulizer solution 2.5 mg (2.5 mg Nebulization Given 09/15/23 1729)  morphine (PF) 4 MG/ML injection 4 mg (4 mg Intravenous Given 09/15/23 1757)    Mobility walks with device

## 2023-09-15 NOTE — ED Notes (Signed)
Patient transported to X-ray 

## 2023-09-15 NOTE — Progress Notes (Signed)
  Steroid-induced hyperglycemia: Blood glucose is elevated around 302 given patient is on high-dose of steroid. - Starting to check POC blood glucose every 6 hours with low sliding scale SSI as needed coverage.

## 2023-09-15 NOTE — H&P (Addendum)
History and Physical    Stacy Bolton:[id number] DOB: 1970-10-11 DOA: 09/15/2023  PCP: Grayce Sessions, NP   Patient coming from: Homeless   Chief Complaint:  Chief Complaint  Patient presents with   Shortness of Breath   ED TRIAGE note:Patient brought in by EMS from a friend house with c/o SOB. Per EMS patient has HX of COPD and had Pneumonia x1 month ago. She noticed last night she had increased SOB used her inhaler with no relief. She has rhonchi and wheezing in both lungs and producing clear sputum. EMS started a 20g in R forearm and gave 125 of Solu-Medrol, 10mg  of Albuterol, 1mg  of Atrovent and 4mg  of Zofran. Patient fell 1 month ago and injured L wrist and did not receive any medical care she also fell last night and c/o back pain.  108/75 94 96% 8L 28  HPI:  NEKEISHA Bolton is a 53 y.o. female with medical history significant of COPD, anxiety, bipolar disorder, polysubstance abuse including ethanol, cocaine abuse, THC abuse and chronic tobacco abuse, homelessness, history of left-sided arm fracture in 12/2 presented to emergency department for evaluation for shortness of breath not improving with inhaler. Room evaluation at the bedside patient reported that she has been having ongoing cough production with wheezing and shortness of breath since she is has been smoking cigarette and unable to quit smoking.  Patient reported felt dizzy but denies any fall.  Reported left-sided wrist injury 1 month ago and has been scheduled to see hand surgeon 1/27 for elective surgical repair.  Patient denies fever and chill.  Denies any chest pain and palpitation.  Patient reported currently she is smoking cigarettes only.  Denies any other drug use and alcohol use.  No other complaint at this time.  In route to ED patient has been treated with Solu-Medrol and albuterol nebulizer.  Patient also complaining about pain of the left wrist since she has a fall but never got  treated. Patient is also complaining about fall.  Patient has been following up with orthopedics team at [facility] regarding the left sided wrist fracture last clinic visit was on 12/27 and patient has been scheduled for outpatient surgical procedure with Dr. Shane Crutch Chiaramonti   however unclear about the date.    ED Course:  At presentation to ED patient is tachycardic heart rate 102, borderline hypotensive and O2 sat 96% on room air as per documentation. Pending respiratory panel. CBC unremarkable. CMP showing low potassium 3.3.  X-ray of the left wrist showed subacute fracture of the distal radius and ulnar styloid with no significant change in the alignment.  Chest x-ray hyperinflation lung.  No superimposed airspace consolidation.  X-ray of the lumbar spine no evidence of fracture.   In the ED patient has been treated with albuterol nebulizer x 2, DuoNeb nebulizer x 2, mag sulfate 2 g, methylprednisolone another dose of 125 mg and morphine 4 mg.  Significant labs in the ED: Lab Orders         Respiratory (~20 pathogens) panel by PCR         Resp panel by RT-PCR (RSV, Flu A&B, Covid) Anterior Nasal Swab         CBC with Differential         Basic metabolic panel         CBC with Differential/Platelet         Comprehensive metabolic panel         CBC  TSH         T4, free         CBG monitoring, ED       Review of Systems:  Review of Systems  Constitutional:  Negative for chills, fever, malaise/fatigue and weight loss.  Respiratory:  Positive for cough, sputum production, shortness of breath and wheezing.   Cardiovascular:  Negative for chest pain and palpitations.  Gastrointestinal:  Negative for abdominal pain, heartburn, nausea and vomiting.  Musculoskeletal:  Negative for falls, joint pain and myalgias.  Neurological:  Negative for dizziness and headaches.  Endo/Heme/Allergies:  Negative for environmental allergies. Bruises/bleeds easily.   Psychiatric/Behavioral:  The patient is not nervous/anxious.   All other systems reviewed and are negative.   Past Medical History:  Diagnosis Date   Anxiety    Asthma    COPD (chronic obstructive pulmonary disease) (HCC)    Dyspnea    Endometriosis    Seizure (HCC)     Past Surgical History:  Procedure Laterality Date   CHOLECYSTECTOMY       reports that she has been smoking cigarettes. She has a 45 pack-year smoking history. She does not have any smokeless tobacco history on file. She reports current alcohol use. She reports current drug use. Drugs: Cocaine and Marijuana.  Allergies  Allergen Reactions   Hydrocodone-Acetaminophen Itching   Hydromorphone Hcl Itching   Oxycodone-Acetaminophen Itching    History reviewed. No pertinent family history.  Prior to Admission medications   Medication Sig Start Date End Date Taking? Authorizing Provider  acetaminophen (TYLENOL) 500 MG tablet Take 1,000 mg by mouth as needed for mild pain (pain score 1-3) or moderate pain (pain score 4-6).    [provider]  albuterol (PROVENTIL HFA) 108 (90 Base) MCG/ACT inhaler Inhale 2 puffs into the lungs every 6 (six) hours as needed for wheezing or shortness of breath. 08/17/23   Almon Hercules, MD  budesonide-formoterol (SYMBICORT) 160-4.5 MCG/ACT inhaler Inhale 2 puffs into the lungs in the morning and at bedtime. 08/17/23   Almon Hercules, MD  cetirizine (ZYRTEC ALLERGY) 10 MG tablet Take 1 tablet (10 mg total) by mouth daily for 10 days. Patient not taking: Reported on 08/07/2023 05/03/23 05/13/23  Marguerita Merles Latif, DO  feeding supplement (ENSURE ENLIVE / ENSURE PLUS) LIQD Take 237 mLs by mouth 3 (three) times daily between meals. Patient not taking: Reported on 08/07/2023 05/03/23   Marguerita Merles Latif, DO  fluticasone [facility]) 50 MCG/ACT nasal spray Place 2 sprays into both nostrils daily. Patient not taking: Reported on 08/07/2023 05/03/23   Marguerita Merles Latif, DO  levothyroxine  (SYNTHROID) 25 MCG tablet Take 1 tablet (25 mcg total) by mouth daily before breakfast. 08/17/23   Almon Hercules, MD  Mouthwashes (MOUTH RINSE) LIQD solution 15 mLs by Mouth Rinse route 4 (four) times daily. Patient not taking: Reported on 08/07/2023 05/03/23   Marguerita Merles Latif, DO  Multiple Vitamin (MULTIVITAMIN WITH MINERALS) TABS tablet Take 1 tablet by mouth daily. Patient not taking: Reported on 08/07/2023 05/03/23   Marguerita Merles Latif, DO  nicotine (NICODERM CQ - DOSED IN MG/24 HOURS) 21 mg/24hr patch Place 1 patch (21 mg total) onto the skin daily. Patient not taking: Reported on 08/07/2023 05/03/23   Marguerita Merles Latif, DO  nicotine polacrilex (NICORETTE) 2 MG gum Take 1 each (2 mg total) by mouth as needed for smoking cessation. Patient not taking: Reported on 08/07/2023 05/03/23   Marguerita Merles Latif, DO  ondansetron (ZOFRAN) 4 MG tablet  Take 1 tablet (4 mg total) by mouth every 8 (eight) hours as needed for nausea or vomiting. 09/03/23   Dolphus Jenny, PA-C  oxyCODONE (ROXICODONE) 5 MG immediate release tablet Take 1 tablet (5 mg total) by mouth every 6 (six) hours as needed for severe pain (pain score 7-10). 08/02/23   Antony Madura, PA-C  psyllium (HYDROCIL/METAMUCIL) 95 % PACK Take 1 packet by mouth daily. Patient not taking: Reported on 08/07/2023 02/09/23   Clapacs, Jackquline Denmark, MD  sodium chloride (OCEAN) 0.65 % SOLN nasal spray Place 1 spray into both nostrils as needed for congestion (nose irritation). Patient not taking: Reported on 08/07/2023 05/03/23   Marguerita Merles Latif, DO  traMADol (ULTRAM) 50 MG tablet Take 50 mg by mouth every 6 (six) hours as needed for moderate pain (pain score 4-6) or severe pain (pain score 7-10). 07/30/23   [provider]  traZODone (DESYREL) 50 MG tablet Take 1 tablet (50 mg total) by mouth at bedtime as needed for sleep. Patient not taking: Reported on 08/07/2023 05/03/23   Merlene Laughter, DO     Physical Exam: Vitals:   09/15/23 1652 09/15/23  1705 09/15/23 1707 09/15/23 2130  BP:  116/80  109/68  Pulse:  (!) 102  98  Resp:    15  Temp:  98.6 F (37 C)  98.7 F (37.1 C)  TempSrc:  Oral  Oral  SpO2: 96% 92%  100%  Weight:   48.5 kg   Height:   5\' 5"  (1.651 m)     Physical Exam Constitutional:      Appearance: She is ill-appearing.  Cardiovascular:     Rate and Rhythm: Normal rate and regular rhythm.  Pulmonary:     Effort: Pulmonary effort is normal. No tachypnea.     Breath sounds: Wheezing and rhonchi present. No decreased breath sounds or rales.  Musculoskeletal:     Cervical back: Normal range of motion and neck supple.     Right lower leg: No edema.     Left lower leg: No edema.     Comments: Left-sided wrist deformity.  Intact radial pulse.  Patient has tenderness with palpation and active movement.  Instructed patient to avoid the wrist splint.  Skin:    Capillary Refill: Capillary refill takes less than 2 seconds.  Neurological:     Mental Status: She is alert and oriented to person, place, and time.  Psychiatric:        Mood and Affect: Mood normal.      Labs on Admission: I have personally reviewed following labs and imaging studies  CBC: Recent Labs  Lab 09/15/23 1839  WBC 4.2  NEUTROABS 3.0  HGB 13.5  HCT 42.8  MCV 100.5*  PLT 329   Basic Metabolic Panel: Recent Labs  Lab 09/15/23 1728  NA 143  K 3.3*  CL 107  CO2 23  GLUCOSE 93  BUN 11  CREATININE 0.43*  CALCIUM 8.0*   GFR: Estimated Creatinine Clearance: 61.6 mL/min (A) (by C-G formula based on SCr of 0.43 mg/dL (L)). Liver Function Tests: No results for input(s): "AST", "ALT", "ALKPHOS", "BILITOT", "PROT", "ALBUMIN" in the last 168 hours. No results for input(s): "LIPASE", "AMYLASE" in the last 168 hours. No results for input(s): "AMMONIA" in the last 168 hours. Coagulation Profile: No results for input(s): "INR", "PROTIME" in the last 168 hours. Cardiac Enzymes: No results for input(s): "CKTOTAL", "CKMB", "CKMBINDEX",  "TROPONINI", "TROPONINIHS" in the last 168 hours. BNP (last 3 results) No  results for input(s): "BNP" in the last 8760 hours. HbA1C: No results for input(s): "HGBA1C" in the last 72 hours. CBG: Recent Labs  Lab 09/15/23 2132  GLUCAP 302*   Lipid Profile: No results for input(s): "CHOL", "HDL", "LDLCALC", "TRIG", "CHOLHDL", "LDLDIRECT" in the last 72 hours. Thyroid Function Tests: No results for input(s): "TSH", "T4TOTAL", "FREET4", "T3FREE", "THYROIDAB" in the last 72 hours. Anemia Panel: No results for input(s): "VITAMINB12", "FOLATE", "FERRITIN", "TIBC", "IRON", "RETICCTPCT" in the last 72 hours. Urine analysis:    Component Value Date/Time   COLORURINE YELLOW 08/01/2023 2112   APPEARANCEUR HAZY (A) 08/01/2023 2112   LABSPEC 1.025 08/01/2023 2112   PHURINE 5.0 08/01/2023 2112   GLUCOSEU NEGATIVE 08/01/2023 2112   HGBUR NEGATIVE 08/01/2023 2112   BILIRUBINUR NEGATIVE 08/01/2023 2112   KETONESUR NEGATIVE 08/01/2023 2112   PROTEINUR NEGATIVE 08/01/2023 2112   UROBILINOGEN 1.0 03/19/2010 1431   NITRITE POSITIVE (A) 08/01/2023 2112   LEUKOCYTESUR NEGATIVE 08/01/2023 2112    Radiological Exams on Admission: I have personally reviewed images DG Wrist Complete Left Result Date: 09/15/2023 CLINICAL DATA:  Left wrist pain, recent fracture EXAM: LEFT WRIST - COMPLETE 3+ VIEW COMPARISON:  X-ray 07/29/2023 FINDINGS: Subacute fractures of the distal radius and ulnar styloid. Prominent periosteal new bone formation at the distal radial fracture site. No significant change in alignment with persistent impaction and dorsal angulation. No definite callus formation at the ulnar styloid fracture site. No new fractures. No dislocation. Persistent soft tissue swelling about the wrist. IMPRESSION: Subacute fractures of the distal radius and ulnar styloid with no significant change in alignment. Electronically Signed   By: Duanne Guess D.O.   On: 09/15/2023 18:45   DG Lumbar Spine  Complete Result Date: 09/15/2023 CLINICAL DATA:  Fall 1 month ago.  Low back pain EXAM: LUMBAR SPINE - COMPLETE 4+ VIEW COMPARISON:  04/11/2008 x-ray, 09/03/2023 CT FINDINGS: There is no evidence of lumbar spine fracture. Alignment is normal. Intervertebral disc spaces are maintained. IMPRESSION: Negative. Electronically Signed   By: Duanne Guess D.O.   On: 09/15/2023 18:43   DG Chest 2 View Result Date: 09/15/2023 CLINICAL DATA:  Shortness of breath EXAM: CHEST - 2 VIEW COMPARISON:  09/03/2023 FINDINGS: The heart size and mediastinal contours are within normal limits. Aortic atherosclerosis. Hyperinflated lungs. No new superimposed airspace consolidation. No pleural effusion or pneumothorax. The visualized skeletal structures are unremarkable. IMPRESSION: Hyperinflated lungs. No new superimposed airspace consolidation. Electronically Signed   By: Duanne Guess D.O.   On: 09/15/2023 18:41     EKG: Pending EKG    Assessment/Plan: Principal Problem:   COPD with acute exacerbation (HCC) Active Problems:   Hypokalemia   History of wrist fracture   GAD (generalized anxiety disorder)   Polysubstance use disorder   History of cigarette smoking   Hypothyroidism    Assessment and Plan: COPD with acute exacerbation Chronic hypoxic respiratory failure 1 to 2 L oxygen at baseline -Patient is presenting with complaining of shortness of breath with associated cough without any improvement with home inhaler.  Physical exam showing rhonchi and wheezing bilateral lower lung field and productive sputum.  She is also complaining about she ran out of her home oxygen cylinder and requesting for refill of oxygen. - In route to ED patient has been treated with Solu-Medrol 125 mg, 10 mg of albuterol, 1 mg Atrovent and 4 mg of Zofran.  In the ED patient has been treated with albuterol nebulizer and DuoNeb nebulizer x 2 and mag sulfate 2 g. -  Currently patient O2 sat 92% on room air and hemodynamically  stable. - Pending respiratory panel - Patient is afebrile and no leukocytosis. - Chest x-ray showing hyperinflation lung field without any superimposed air space consolidation. -Plan to continue treat patient with IV Solu-Medrol 40 mg twice daily to complete 4 days course.  Continue  Xopenex and ipratropium every 6 hours scheduled and DuoNeb as needed for wheezing shortness of breath. - Continue supplemental oxygen as needed to keep O2 sat above 89%. - Continue supportive care, flutter valve and spirometry. Continue Mucinex. - Continue cardia monitoring -Consulting transition care team for portable oxygen cylinder.   Hypokalemia -Low potassium 3.3.  Replating with oral KCl 40 mEq   History of wrist fracture -Patient is complaining about left-sided wrist pain.  Per chart review patient is not compliant with wearing the wrist splint. - X-ray of the left wrist showing subacute fracture of the distal radius and ulnar styloid process with no significant change of alignment. - Patient is following outpatient orthopedics with [facility] planning for outpatient schedule surgery soon. -Continue wrist splint -In the ED patient has been received 4 mg of morphine. - Continue home tramadol 50 mg 6-hour as needed for moderate and severe pain.  Reviewed PDMP.  In December patient has prescribed oxycodone, hydrocodone and tramadol from 2 different physician. -Patient reported that she is going to see hand surgeon on 09/23/2023 to discuss her surgery.   History of hypothyroidism - Holding levothyroxine as unclear patient is currently taking or not. - Checking TSH and T4 level first.   History of polysubstance abuse - Patient is reporting that she is not using alcohol, cocaine and THC anymore.  Chronic smoker-smoking of cigarettes -Patient reported currently smoking half to 1 pack cigarettes per day. - Continue nicotine patch. -Counseled patient at bedside for smoking  cessation.   Steroid-induced hyperglycemia: Blood glucose is elevated around 302 given patient is on high-dose of steroid. - Starting to check POC blood glucose every 6 hours with low sliding scale SSI as needed coverage.   DVT prophylaxis:  Lovenox Code Status:  Full Code Diet: Heart healthy carb modified diet given patient is on steroid Family Communication:   Family was present at bedside, at the time of interview. Opportunity was given to ask question and all questions were answered satisfactorily.  Disposition Plan: Discharge to home next 2 to 3 days. Consults: None at this time Admission status:   Inpatient, Telemetry bed  Severity of Illness: The appropriate patient status for this patient is INPATIENT. Inpatient status is judged to be reasonable and necessary in order to provide the required intensity of service to ensure the patient's safety. The patient's presenting symptoms, physical exam findings, and initial radiographic and laboratory data in the context of their chronic comorbidities is felt to place them at high risk for further clinical deterioration. Furthermore, it is not anticipated that the patient will be medically stable for discharge from the hospital within 2 midnights of admission.   * I certify that at the point of admission it is my clinical judgment that the patient will require inpatient hospital care spanning beyond 2 midnights from the point of admission due to high intensity of service, high risk for further deterioration and high frequency of surveillance required.Marland Kitchen    Tereasa Coop, MD [facility]  How to contact the [facility] Attending or Consulting provider 7A - 7P or covering provider during after hours 7P -7A, for this patient.  Check the care team in [facility]  and look for a) attending/consulting [facility] provider listed and b) the [facility] team listed Log into [email or web address] and use [place]'s universal password to access. If you do not have the password,  please contact the hospital operator. Locate the [facility] provider you are looking for under [facility] and page to a number that you can be directly reached. If you still have difficulty reaching the provider, please page the [facility] (Director on Call) for the Hospitalists listed on amion for assistance.  09/15/2023, 9:42 PM

## 2023-09-15 NOTE — Progress Notes (Signed)
   09/15/23 2252  Assess: MEWS Score  Temp 98.5 F (36.9 C)  BP 90/61  MAP (mmHg) 69  Pulse Rate (!) 105  Resp 20  SpO2 98 %  O2 Device Nasal Cannula  O2 Flow Rate (L/min) 3 L/min  Assess: MEWS Score  MEWS Temp 0  MEWS Systolic 1  MEWS Pulse 1  MEWS RR 0  MEWS LOC 0  MEWS Score 2  MEWS Score Color Yellow  Assess: if the MEWS score is Yellow or Red  Were vital signs accurate and taken at a resting state? Yes  Does the patient meet 2 or more of the SIRS criteria? No  MEWS guidelines implemented  Yes, yellow  Treat  MEWS Interventions Considered administering scheduled or prn medications/treatments as ordered  Take Vital Signs  Increase Vital Sign Frequency  Yellow: Q2hr x1, continue Q4hrs until patient remains green for 12hrs  Escalate  MEWS: Escalate Yellow: Discuss with charge nurse and consider notifying provider and/or RRT  Assess: SIRS CRITERIA  SIRS Temperature  0  SIRS Respirations  0  SIRS Pulse 1  SIRS WBC 0  SIRS Score Sum  1

## 2023-09-15 NOTE — ED Notes (Signed)
Patient refused left wrist splint at this time witnessed by Kyla Balzarine, [place]

## 2023-09-15 NOTE — ED Notes (Signed)
CBG 302 

## 2023-09-15 NOTE — ED Provider Notes (Signed)
[place] EMERGENCY DEPARTMENT AT [facility] Provider Note   CSN: [id number] Arrival date & time: 09/15/23  1649     History {Add pertinent medical, surgical, social history, OB history to HPI:1} Chief Complaint  Patient presents with   Shortness of Breath    Stacy Bolton is a 53 y.o. female.  Patient is free of COPD.  She complains of cough and shortness of breath and wheezing.  She was given steroids and a breathing treatment by the paramedics.  She also has a history of fracture to her left wrist which she is going to have surgery on   Shortness of Breath      Home Medications Prior to Admission medications   Medication Sig Start Date End Date Taking? Authorizing Provider  acetaminophen (TYLENOL) 500 MG tablet Take 1,000 mg by mouth as needed for mild pain (pain score 1-3) or moderate pain (pain score 4-6).    [provider]  albuterol (PROVENTIL HFA) 108 (90 Base) MCG/ACT inhaler Inhale 2 puffs into the lungs every 6 (six) hours as needed for wheezing or shortness of breath. 08/17/23   Almon Hercules, MD  budesonide-formoterol (SYMBICORT) 160-4.5 MCG/ACT inhaler Inhale 2 puffs into the lungs in the morning and at bedtime. 08/17/23   Almon Hercules, MD  cetirizine (ZYRTEC ALLERGY) 10 MG tablet Take 1 tablet (10 mg total) by mouth daily for 10 days. Patient not taking: Reported on 08/07/2023 05/03/23 05/13/23  Marguerita Merles Latif, DO  feeding supplement (ENSURE ENLIVE / ENSURE PLUS) LIQD Take 237 mLs by mouth 3 (three) times daily between meals. Patient not taking: Reported on 08/07/2023 05/03/23   Marguerita Merles Latif, DO  fluticasone [facility]) 50 MCG/ACT nasal spray Place 2 sprays into both nostrils daily. Patient not taking: Reported on 08/07/2023 05/03/23   Marguerita Merles Latif, DO  levothyroxine (SYNTHROID) 25 MCG tablet Take 1 tablet (25 mcg total) by mouth daily before breakfast. 08/17/23   Almon Hercules, MD  Mouthwashes (MOUTH RINSE) LIQD solution  15 mLs by Mouth Rinse route 4 (four) times daily. Patient not taking: Reported on 08/07/2023 05/03/23   Marguerita Merles Latif, DO  Multiple Vitamin (MULTIVITAMIN WITH MINERALS) TABS tablet Take 1 tablet by mouth daily. Patient not taking: Reported on 08/07/2023 05/03/23   Marguerita Merles Latif, DO  nicotine (NICODERM CQ - DOSED IN MG/24 HOURS) 21 mg/24hr patch Place 1 patch (21 mg total) onto the skin daily. Patient not taking: Reported on 08/07/2023 05/03/23   Marguerita Merles Latif, DO  nicotine polacrilex (NICORETTE) 2 MG gum Take 1 each (2 mg total) by mouth as needed for smoking cessation. Patient not taking: Reported on 08/07/2023 05/03/23   Marguerita Merles Latif, DO  ondansetron (ZOFRAN) 4 MG tablet Take 1 tablet (4 mg total) by mouth every 8 (eight) hours as needed for nausea or vomiting. 09/03/23   Dolphus Jenny, PA-C  oxyCODONE (ROXICODONE) 5 MG immediate release tablet Take 1 tablet (5 mg total) by mouth every 6 (six) hours as needed for severe pain (pain score 7-10). 08/02/23   Antony Madura, PA-C  psyllium (HYDROCIL/METAMUCIL) 95 % PACK Take 1 packet by mouth daily. Patient not taking: Reported on 08/07/2023 02/09/23   Clapacs, Jackquline Denmark, MD  sodium chloride (OCEAN) 0.65 % SOLN nasal spray Place 1 spray into both nostrils as needed for congestion (nose irritation). Patient not taking: Reported on 08/07/2023 05/03/23   Marguerita Merles Latif, DO  traMADol (ULTRAM) 50 MG tablet Take 50 mg by mouth  every 6 (six) hours as needed for moderate pain (pain score 4-6) or severe pain (pain score 7-10). 07/30/23   [provider]  traZODone (DESYREL) 50 MG tablet Take 1 tablet (50 mg total) by mouth at bedtime as needed for sleep. Patient not taking: Reported on 08/07/2023 05/03/23   Marguerita Merles Latif, DO      Allergies    Hydrocodone-acetaminophen, Hydromorphone hcl, and Oxycodone-acetaminophen    Review of Systems   Review of Systems  Respiratory:  Positive for shortness of breath.     Physical  Exam Updated Vital Signs BP 116/80 (BP Location: Right Arm)   Pulse (!) 102   Temp 98.6 F (37 C) (Oral)   Ht 5\' 5"  (1.651 m)   Wt 48.5 kg   LMP 10/13/2011   SpO2 92%   BMI 17.81 kg/m  Physical Exam  ED Results / Procedures / Treatments   Labs (all labs ordered are listed, but only abnormal results are displayed) Labs Reviewed  BASIC METABOLIC PANEL - Abnormal; Notable for the following components:      Result Value   Potassium 3.3 (*)    Creatinine, Ser 0.43 (*)    Calcium 8.0 (*)    All other components within normal limits  CBC WITH DIFFERENTIAL/PLATELET - Abnormal; Notable for the following components:   MCV 100.5 (*)    All other components within normal limits  RESP PANEL BY RT-PCR (RSV, FLU A&B, COVID)  RVPGX2  RESPIRATORY PANEL BY PCR  RESP PANEL BY RT-PCR (RSV, FLU A&B, COVID)  RVPGX2  CBC WITH DIFFERENTIAL/PLATELET    EKG None  Radiology DG Wrist Complete Left Result Date: 09/15/2023 CLINICAL DATA:  Left wrist pain, recent fracture EXAM: LEFT WRIST - COMPLETE 3+ VIEW COMPARISON:  X-ray 07/29/2023 FINDINGS: Subacute fractures of the distal radius and ulnar styloid. Prominent periosteal new bone formation at the distal radial fracture site. No significant change in alignment with persistent impaction and dorsal angulation. No definite callus formation at the ulnar styloid fracture site. No new fractures. No dislocation. Persistent soft tissue swelling about the wrist. IMPRESSION: Subacute fractures of the distal radius and ulnar styloid with no significant change in alignment. Electronically Signed   By: Duanne Guess D.O.   On: 09/15/2023 18:45   DG Lumbar Spine Complete Result Date: 09/15/2023 CLINICAL DATA:  Fall 1 month ago.  Low back pain EXAM: LUMBAR SPINE - COMPLETE 4+ VIEW COMPARISON:  04/11/2008 x-ray, 09/03/2023 CT FINDINGS: There is no evidence of lumbar spine fracture. Alignment is normal. Intervertebral disc spaces are maintained. IMPRESSION: Negative.  Electronically Signed   By: Duanne Guess D.O.   On: 09/15/2023 18:43   DG Chest 2 View Result Date: 09/15/2023 CLINICAL DATA:  Shortness of breath EXAM: CHEST - 2 VIEW COMPARISON:  09/03/2023 FINDINGS: The heart size and mediastinal contours are within normal limits. Aortic atherosclerosis. Hyperinflated lungs. No new superimposed airspace consolidation. No pleural effusion or pneumothorax. The visualized skeletal structures are unremarkable. IMPRESSION: Hyperinflated lungs. No new superimposed airspace consolidation. Electronically Signed   By: Duanne Guess D.O.   On: 09/15/2023 18:41    Procedures Procedures  {Document cardiac monitor, telemetry assessment procedure when appropriate:1}  Medications Ordered in ED Medications  ipratropium-albuterol (DUONEB) 0.5-2.5 (3) MG/3ML nebulizer solution 3 mL (has no administration in time range)  albuterol (PROVENTIL) (2.5 MG/3ML) 0.083% nebulizer solution 2.5 mg (has no administration in time range)  potassium chloride SA (KLOR-CON M) CR tablet 40 mEq (has no administration in time range)  methylPREDNISolone sodium succinate (SOLU-MEDROL) 125 mg/2 mL injection 125 mg (125 mg Intravenous Given 09/15/23 1732)  magnesium sulfate IVPB 2 g 50 mL (0 g Intravenous Stopped 09/15/23 1859)  ipratropium-albuterol (DUONEB) 0.5-2.5 (3) MG/3ML nebulizer solution 3 mL (3 mLs Nebulization Given 09/15/23 1729)  albuterol (PROVENTIL) (2.5 MG/3ML) 0.083% nebulizer solution 2.5 mg (2.5 mg Nebulization Given 09/15/23 1729)  morphine (PF) 4 MG/ML injection 4 mg (4 mg Intravenous Given 09/15/23 1757)    ED Course/ Medical Decision Making/ A&P   {   Click here for ABCD2, HEART and other calculatorsREFRESH Note before signing :1}                              Medical Decision Making Amount and/or Complexity of Data Reviewed Labs: ordered. Radiology: ordered.  Risk Prescription drug management. Decision regarding hospitalization.   Patient with significant  wheezing and shortness of breath.  She will be admitted with COPD exacerbation  {Document critical care time when appropriate:1} {Document review of labs and clinical decision tools ie heart score, Chads2Vasc2 etc:1}  {Document your independent review of radiology images, and any outside records:1} {Document your discussion with family members, caretakers, and with consultants:1} {Document social determinants of health affecting pt's care:1} {Document your decision making why or why not admission, treatments were needed:1} Final Clinical Impression(s) / ED Diagnoses Final diagnoses:  COPD exacerbation (HCC)    Rx / DC Orders ED Discharge Orders     None

## 2023-09-16 DIAGNOSIS — J441 Chronic obstructive pulmonary disease with (acute) exacerbation: Secondary | ICD-10-CM

## 2023-09-16 LAB — GLUCOSE, CAPILLARY
Glucose-Capillary: 159 mg/dL — ABNORMAL HIGH (ref 70–99)
Glucose-Capillary: 101 mg/dL — ABNORMAL HIGH (ref 70–99)
Glucose-Capillary: 127 mg/dL — ABNORMAL HIGH (ref 70–99)
Glucose-Capillary: 155 mg/dL — ABNORMAL HIGH (ref 70–99)

## 2023-09-16 LAB — RESPIRATORY PANEL BY PCR

## 2023-09-16 LAB — COMPREHENSIVE METABOLIC PANEL
ALT: 18 U/L (ref 0–44)
AST: 24 U/L (ref 15–41)
Albumin: 3.3 g/dL — ABNORMAL LOW (ref 3.5–5.0)
Alkaline Phosphatase: 75 U/L (ref 38–126)
Anion gap: 7 (ref 5–15)
BUN: 16 mg/dL (ref 6–20)
CO2: 27 mmol/L (ref 22–32)
Calcium: 8.3 mg/dL — ABNORMAL LOW (ref 8.9–10.3)
Chloride: 106 mmol/L (ref 98–111)
Creatinine, Ser: 0.43 mg/dL — ABNORMAL LOW (ref 0.44–1.00)
GFR, Estimated: >60 mL/min (ref >60)
Glucose, Bld: 200 mg/dL — ABNORMAL HIGH (ref 70–99)
Potassium: 3.8 mmol/L (ref 3.5–5.1)
Sodium: 140 mmol/L (ref 135–145)
Total Bilirubin: 0.5 mg/dL (ref 0.0–1.2)
Total Protein: 5.8 g/dL — ABNORMAL LOW (ref 6.5–8.1)

## 2023-09-16 LAB — CBC
HCT: 38.8 % (ref 36.0–46.0)
Hemoglobin: 12.5 g/dL (ref 12.0–15.0)
MCH: 32.2 pg (ref 26.0–34.0)
MCHC: 32.2 g/dL (ref 30.0–36.0)
MCV: 100 fL (ref 80.0–100.0)
Platelets: 298 10*3/uL (ref 150–400)
RBC: 3.88 MIL/uL (ref 3.87–5.11)
RDW: 13.4 % (ref 11.5–15.5)
WBC: 4 10*3/uL (ref 4.0–10.5)
nRBC: 0 % (ref 0.0–0.2)

## 2023-09-16 LAB — T4, FREE: Free T4: 0.78 ng/dL (ref 0.61–1.12)

## 2023-09-16 MED ORDER — AZITHROMYCIN 250 MG PO TABS
500.0000 mg | ORAL_TABLET | Freq: Every day | ORAL | Status: DC
  Administered 2023-09-16 – 2023-09-17 (×2): 500 mg via ORAL
  Filled 2023-09-16 (×2): qty 2

## 2023-09-16 MED ORDER — PREDNISONE 20 MG PO TABS
40.0000 mg | ORAL_TABLET | Freq: Every day | ORAL | Status: DC
  Administered 2023-09-17: 40 mg via ORAL
  Filled 2023-09-16: qty 2

## 2023-09-16 MED ORDER — DIPHENHYDRAMINE HCL 25 MG PO CAPS: 25.0000 mg | ORAL_CAPSULE | Freq: Once | ORAL | Status: DC | PRN

## 2023-09-16 MED ORDER — LORAZEPAM 1 MG PO TABS
1.0000 mg | ORAL_TABLET | Freq: Once | ORAL | Status: AC
  Administered 2023-09-16: 1 mg via ORAL
  Filled 2023-09-16: qty 1

## 2023-09-16 MED ORDER — ALUM & MAG HYDROXIDE-SIMETH 200-200-20 MG/5ML PO SUSP
15.0000 mL | ORAL | Status: DC | PRN
  Administered 2023-09-16: 15 mL via ORAL
  Filled 2023-09-16: qty 30

## 2023-09-16 NOTE — Progress Notes (Signed)
Mobility Specialist - Progress Note  (RA) Pre-mobility: 100 bpm HR, 92% SpO2 During mobility: 107 bpm HR, 90% SpO2 Post-mobility: 99 bpm HR, 93% SPO2   09/16/23 1609  Mobility  Activity Ambulated with assistance in hallway  Level of Assistance Standby assist, set-up cues, supervision of patient - no hands on  Assistive Device Four wheel walker  Distance Ambulated (ft) 350 ft  Range of Motion/Exercises Active  Activity Response Tolerated well  Mobility Referral Yes  Mobility visit 1 Mobility  Mobility Specialist Start Time (ACUTE ONLY) 1555  Mobility Specialist Stop Time (ACUTE ONLY) 1609  Mobility Specialist Time Calculation (min) (ACUTE ONLY) 14 min   Pt was found in bed and agreeable to ambulate. Stated feeling a little lightheaded with session. At EOS returned to bed with all needs met. Call bell in reach.  Billey Chang Mobility Specialist

## 2023-09-16 NOTE — Progress Notes (Signed)
SATURATION QUALIFICATIONS: (This note is used to comply with regulatory documentation for home oxygen)  Patient Saturations on Room Air at Rest = 97%  Patient Saturations on Room Air while Ambulating = 93%  Patient Saturations on  Liters of oxygen while Ambulating = %  Please briefly explain why patient needs home oxygen: O2 not needed at this time, patient able to maintain saturations on RA
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
## 2023-09-16 NOTE — Progress Notes (Signed)
TRH ROUNDING   NOTE YESENA GREENSTEIN OZH:[id number]  DOB: 12/07/1970  DOA: 09/15/2023  PCP: Grayce Sessions, NP  09/16/2023,7:40 AM   LOS: 1 day      Code Status: Full code From: Home  current Dispo: Likely home   53 year old female, known polysubstance abuse cocaine EtOH THC tobacco Previously homeless Rectal prolapse in the past--recent fall and left wrist injury supposed to see a surgeon orthopedics [facility] Dr. Laurence Compton --appointment is scheduled for 1/27 Last admitted 12/10 through 08/17/2023 with community-acquired pneumonia RML-that hospital stay she had polymicrobial bacteremia Staph epidermidis?  Strep TEE workup was negative and patient was transitioned to oral meds  She was recently diagnosed with norovirus Present from friend [facility] 1/19 SOB despite inhaler with wheezing clear sputum Rx Solu-Medrol albuterol Atrovent Zofran  Sodium 143 potassium 3.3 BUN/creatinine 11/0.4:: WBC 4.2 hemoglobin 13 platelets 329   Respiratory viral panel negative  CBGs were elevated in the setting of no recent known diabetes  procedures 1/19 CXR hyperinflated lung no airspace disease, lumbar spine x-rays negative, left wrist fracture showed subacute fractures distal radius ulnar styloid with no significant alignment change   Plan  COPD exacerbation No airspace disease probably secondary to just exacerbation of COPD--not sure if she needs IV steroids I will switch to prednisone 40 mg 5 days total Will switch azithromycin to 500 p.o. daily Ambulatory desat screen menance does not use oxygen-counseled against smoking Fall and Subacute fractures distal radius/ulnar styloid with no alignment change She has some pain in her upper extremity on the left side-we will continue ibuprofen 400 every 6 as needed and tramadol 50 every 6 as needed Would not use IV pain meds for now She has an appointment on the 27th for follow-up of this and should keep her hand in the brace that she is  wearing Recent norovirus infection Still having little bit of diarrhea but this improved No likely need follow-up Hypokalemia on admission Resolved with replacement no further workup required Prior polysubstance abuse Recommend absolute cessation Continued smoker Needs absolute cessation-counseled about the same Impaired glucose tolerance in the setting of steroids Expect blood sugars will drop and patient should not need glycemic control going forward Nursing to alert if sugars are >200 for coverage   DVT prophylaxis: Lovenox  Status is: Inpatient Remains inpatient appropriate because:   Requires further monitoring over the course of the next 1 to 2 days      Subjective: Eating breakfast no fever no chills no nausea no vomiting no chest pain  Objective + exam Vitals:   09/15/23 2252 09/16/23 0039 09/16/23 0135 09/16/23 0509  BP: 90/61 115/80  98/62  Pulse: (!) 105 86  77  Resp: 20 20  20   Temp: 98.5 F (36.9 C) 97.8 F (36.6 C)  98.3 F (36.8 C)  TempSrc: Oral Oral  Oral  SpO2: 98% 100% 97% 99%  Weight:      Height:       Filed Weights   09/15/23 1707  Weight: 48.5 kg    Examination: Frail white female no distress no icterus no pallor Mild wheeze posteriorly. On Oxygen Abdomen Soft No Rebound S1-S2 No Murmur ROM Intact No Lower Extremity Edema She has a brace on her left upper extremity  Data Reviewed: reviewed   CBC    Component Value Date/Time   WBC 4.0 09/16/2023 0319   RBC 3.88 09/16/2023 0319   HGB 12.5 09/16/2023 0319   HCT 38.8 09/16/2023 0319   PLT 298 09/16/2023  0319   MCV 100.0 09/16/2023 0319   MCH 32.2 09/16/2023 0319   MCHC 32.2 09/16/2023 0319   RDW 13.4 09/16/2023 0319   LYMPHSABS 1.1 09/15/2023 1839   MONOABS 0.1 09/15/2023 1839   EOSABS 0.0 09/15/2023 1839   BASOSABS 0.0 09/15/2023 1839      Latest Ref Rng & Units 09/16/2023    3:19 AM 09/15/2023    5:28 PM 09/02/2023   10:38 PM  CMP  Glucose 70 - 99 mg/dL 253  93  664    BUN 6 - 20 mg/dL 16  11  14    Creatinine 0.44 - 1.00 mg/dL 4.03  4.74  2.59   Sodium 135 - 145 mmol/L 140  143  138   Potassium 3.5 - 5.1 mmol/L 3.8  3.3  3.3   Chloride 98 - 111 mmol/L 106  107  103   CO2 22 - 32 mmol/L 27  23  26    Calcium 8.9 - 10.3 mg/dL 8.3  8.0  8.8   Total Protein 6.5 - 8.1 g/dL 5.8   7.1   Total Bilirubin 0.0 - 1.2 mg/dL 0.5   0.7   Alkaline Phos 38 - 126 U/L 75   92   AST 15 - 41 U/L 24   18   ALT 0 - 44 U/L 18   17     Scheduled Meds:  enoxaparin (LOVENOX) injection  40 mg Subcutaneous Q24H   feeding supplement  237 mL Oral TID BM   guaiFENesin  600 mg Oral BID   insulin aspart  0-6 Units Subcutaneous Q6H   ipratropium  0.5 mg Nebulization Q6H   levalbuterol  0.63 mg Nebulization Q6H   methylPREDNISolone (SOLU-MEDROL) injection  40 mg Intravenous Q12H   nicotine  21 mg Transdermal Daily   pantoprazole  40 mg Oral Daily   sodium chloride flush  3 mL Intravenous Q12H   sodium chloride flush  3 mL Intravenous Q12H   Continuous Infusions:  sodium chloride     azithromycin Stopped (09/15/23 2259)    Time 42  Rhetta Mura, MD  [facility]

## 2023-09-17 ENCOUNTER — Other Ambulatory Visit (HOSPITAL_COMMUNITY): Payer: Self-pay

## 2023-09-17 DIAGNOSIS — J441 Chronic obstructive pulmonary disease with (acute) exacerbation: Secondary | ICD-10-CM | POA: Diagnosis not present

## 2023-09-17 LAB — GLUCOSE, CAPILLARY
Glucose-Capillary: 117 mg/dL — ABNORMAL HIGH (ref 70–99)
Glucose-Capillary: 78 mg/dL (ref 70–99)

## 2023-09-17 MED ORDER — PANTOPRAZOLE SODIUM 40 MG PO TBEC
40.0000 mg | DELAYED_RELEASE_TABLET | Freq: Every day | ORAL | 0 refills | Status: DC
  Filled 2023-09-17: qty 30, 30d supply, fill #0

## 2023-09-17 MED ORDER — GUAIFENESIN ER 600 MG PO TB12
600.0000 mg | ORAL_TABLET | Freq: Two times a day (BID) | ORAL | 0 refills | Status: DC
  Filled 2023-09-17: qty 60, 30d supply, fill #0

## 2023-09-17 MED ORDER — IBUPROFEN 400 MG PO TABS: 400.0000 mg | ORAL_TABLET | Freq: Four times a day (QID) | ORAL | Status: DC | PRN

## 2023-09-17 MED ORDER — AZITHROMYCIN 500 MG PO TABS
ORAL_TABLET | ORAL | 0 refills | Status: DC
  Filled 2023-09-17: qty 4, 4d supply, fill #0

## 2023-09-17 MED ORDER — PREDNISONE 20 MG PO TABS
40.0000 mg | ORAL_TABLET | Freq: Every day | ORAL | 0 refills | Status: AC
  Filled 2023-09-17: qty 8, 4d supply, fill #0

## 2023-09-17 MED ORDER — TRAMADOL HCL 50 MG PO TABS
50.0000 mg | ORAL_TABLET | Freq: Four times a day (QID) | ORAL | 0 refills | Status: AC | PRN
  Filled 2023-09-17: qty 20, 5d supply, fill #0

## 2023-09-17 NOTE — Plan of Care (Signed)
  Problem: Education: Goal: Knowledge of General Education information will improve Description: Including pain rating scale, medication(s)/side effects and non-pharmacologic comfort measures Outcome: Progressing   Problem: Clinical Measurements: Goal: Respiratory complications will improve Outcome: Progressing   Problem: Activity: Goal: Risk for activity intolerance will decrease Outcome: Progressing   Problem: Coping: Goal: Level of anxiety will decrease Outcome: Progressing   Problem: Pain Managment: Goal: General experience of comfort will improve and/or be controlled Outcome: Progressing

## 2023-09-17 NOTE — Progress Notes (Signed)
AVS reviewed w/ pt who verbalized an understanding. No other questions at this time. PIV removed as noted. Pt dressed for d/c. Pt requesting a cab voucher to return to [facility]. Cab voucher received from [facility]. Pt to lobby  via w/c w/ rollator. [employer] cab called by this RN  enroute to lobby.

## 2023-09-17 NOTE — Progress Notes (Signed)
This RN called [employer] cab from [employer]- pt stated she did not want to go to [facility]  because "they make you stay outside. I want to go to a friend's so I can be inside" Pt waiting in lobby while RN reaches out to [facility]  for a change in cab voucher. Pt now wants to go to [address] [place], [address], [facility] [employer] [employer] [facility]. New voucher obtained- Rolan Bucco called by this RN

## 2023-09-17 NOTE — TOC Transition Note (Addendum)
Transition of Care [facility]) - Discharge Note  Patient Details  Name: Stacy Bolton MRN: [id number] Date of Birth: [birth year]  Transition of Care [facility]) CM/SW Contact:  Ewing Schlein, LCSW Phone Number: 09/17/2023, 11:12 AM  Clinical Narrative: Patient has been weaned off oxygen. CSW spoke with Marylene Land at [facility] and confirmed the patient can return today as she will not be returning with oxygen. Patient will need transportation back to the facility. Taxi voucher provided to [employer]. TOC signing off.  Addendum: CSW notified by Consuello Closs that patient wants to discharge to [facility] [address] [place], [place] [address]. Updated taxi voucher completed.   Final next level of care: Homeless Shelter [facility]) Barriers to Discharge: Barriers Resolved  Patient Goals and CMS Choice Patient states their goals for this hospitalization and ongoing recovery are:: Return to [facility] Choice offered to / list presented to : NA  Discharge Plan and Services Additional resources added to the After Visit Summary for   In-house Referral: Clinical Social Work        DME Arranged: N/A DME Agency: NA  Social Drivers of Health (SDOH) Interventions SDOH Screenings   Food Insecurity: Food Insecurity Present (09/15/2023)  Housing: High Risk (09/15/2023)  Transportation Needs: Unmet Transportation Needs (09/15/2023)  Utilities: At Risk (09/15/2023)  Alcohol Screen: Low Risk  (01/31/2023)  Financial Resource Strain: High Risk (01/16/2023)   Received from [facility], [facility]  Physical Activity: Sufficiently Active (01/16/2023)   Received from [facility], [facility]  Social Connections: Socially Isolated (01/16/2023)   Received from [facility], [facility]  Tobacco Use: High Risk (09/15/2023)  Health Literacy: Low Risk  (01/16/2023)   Received from [facility], [facility]   Readmission Risk Interventions    09/16/2023   10:20 AM 08/07/2023    1:43  PM 04/30/2023   11:24 AM  Readmission Risk Prevention Plan  Transportation Screening Complete Complete Complete  Medication Review (RN Care Manager) Complete Complete   PCP or Specialist appointment within 3-5 days of discharge  Complete   HRI or Home Care Consult Complete  Complete  SW Recovery Care/Counseling Consult Complete Complete Complete  Palliative Care Screening Not Applicable Not Applicable Not Applicable  Skilled Nursing Facility Not Applicable Not Applicable Not Applicable

## 2023-09-17 NOTE — Discharge Summary (Signed)
Physician Discharge Summary  Stacy Bolton ZOX:[id number] DOB: 10/31/1970 DOA: 09/15/2023  PCP: Grayce Sessions, NP  Admit date: 09/15/2023 Discharge date: 09/17/2023  Time spent: 40 minutes  Recommendations for Outpatient Follow-up:  Requires azithromycin/prednisone for the next 3 to 4 days and then discontinue as per orders for COPD exacerbation Should follow-up with orthopedic surgeon on 1/27 for left hand wrist injury Patient will need counseling for abstinence off of multiple polysubstances and cigarettes May benefit from A1c in about a month to determine if underlying diabetes mellitus is present-had impaired glucose tolerance during hospital stay  Discharge Diagnoses:  MAIN problem for hospitalization   Mild COPD exacerbation  Please see below for itemized issues addressed in HOpsital- refer to other progress notes for clarity if needed  Discharge Condition: Improved  Diet recommendation: Regular  Filed Weights   09/15/23 1707  Weight: 48.5 kg    History of present illness:  53 year old female, known polysubstance abuse cocaine EtOH THC tobacco Previously homeless Rectal prolapse in the past--recent fall and left wrist injury supposed to see a surgeon orthopedics [facility] Dr. Laurence Compton --appointment is scheduled for 1/27 Last admitted 12/10 through 08/17/2023 with community-acquired pneumonia RML-that hospital stay she had polymicrobial bacteremia Staph epidermidis?  Strep TEE workup was negative and patient was transitioned to oral meds   She was recently diagnosed with norovirus [facility] 1/19 SOB despite inhaler with wheezing clear sputum Rx Solu-Medrol albuterol Atrovent Zofran               Sodium 143 potassium 3.3 BUN/creatinine 11/0.4:: WBC 4.2 hemoglobin 13 platelets 329                             Respiratory viral panel negative               CBGs were elevated in the setting of no recent known diabetes   procedures 1/19 CXR  hyperinflated lung no airspace disease, lumbar spine x-rays negative, left wrist fracture showed subacute fractures distal radius ulnar styloid with no significant alignment change     Plan   COPD exacerbation No airspace disease likely COPD--IV steroids switched to p.o. prednisone and will complete in the outpatient Will switch azithromycin to 500 p.o. daily Did not require oxygen at discharge cessation counseling as below Fall and Subacute fractures distal radius/ulnar styloid with no alignment change She has some pain in her upper extremity on the left side-we will continue ibuprofen 400 every 6 as needed and tramadol 50 every 6 as needed-have given her very limited prescription of tramadol at discharge She has an appointment on the 27th for follow-up of this and should keep her hand in the brace that she is wearing Recent norovirus infection Still having little bit of diarrhea but this improved No likely need follow-up Hypokalemia on admission Resolved with replacement no further workup required Needs intermittent labs in the outpatient setting Prior polysubstance abuse Recommend absolute cessation Will need further counseling as an outpatient Continued smoker Needs absolute cessation-counseled about the same Impaired glucose tolerance in the setting of steroids Expect blood sugars will drop-during hospital stay CBGs were less than 120 predominantly and she will need outpatient follow-up  Discharge Exam: Vitals:   09/17/23 0553 09/17/23 0751  BP: 99/67   Pulse: 72   Resp: 16   Temp: 98.6 F (37 C)   SpO2: 96% 93%    Subj on day of d/c   Awake  sitting up no distress now off oxygen feels well no fever no chills  General Exam on discharge  EOMI NCAT no focal deficit no icterus no pallor Chest clear no added sounds no rales no rhonchi ROM intact S1-S2 no murmur Abdomen soft no rebound  Discharge Instructions   Discharge Instructions     Diet - low sodium heart  healthy   Complete by: As directed    Discharge instructions   Complete by: As directed    This hospital stay you were diagnosed with possible flare of COPD-it would be in your best interest to finish the steroids and azithromycin as has been prescribed and stop smoking At your request we will get a small dose/prescription of tramadol which you should take until you follow-up with the orthopedic surgeon for your hand For severe shortness of breath vomiting chest pain please get medical attention   Increase activity slowly   Complete by: As directed       Allergies as of 09/17/2023       Reactions   Hydrocodone-acetaminophen Itching   Hydromorphone Hcl Itching   Oxycodone-acetaminophen Itching        Medication List     STOP taking these medications    HYDROcodone-acetaminophen 5-325 MG tablet Commonly known as: NORCO/VICODIN   oxyCODONE 5 MG immediate release tablet Commonly known as: Roxicodone       TAKE these medications    acetaminophen 500 MG tablet Commonly known as: TYLENOL Take 1,000 mg by mouth as needed for mild pain (pain score 1-3) or moderate pain (pain score 4-6).   albuterol 108 (90 Base) MCG/ACT inhaler Commonly known as: Proventil HFA Inhale 2 puffs into the lungs every 6 (six) hours as needed for wheezing or shortness of breath.   azithromycin 500 MG tablet Commonly known as: ZITHROMAX Fin course Start taking on: September 18, 2023   budesonide-formoterol 160-4.5 MCG/ACT inhaler Commonly known as: Symbicort Inhale 2 puffs into the lungs in the morning and at bedtime.   guaiFENesin 600 MG 12 hr tablet Commonly known as: MUCINEX Take 1 tablet (600 mg total) by mouth 2 (two) times daily.   ibuprofen 400 MG tablet Commonly known as: ADVIL Take 1 tablet (400 mg total) by mouth every 6 (six) hours as needed for mild pain (pain score 1-3) (or Fever >/= 101).   levothyroxine 25 MCG tablet Commonly known as: SYNTHROID Take 1 tablet (25 mcg total)  by mouth daily before breakfast.   ondansetron 4 MG tablet Commonly known as: ZOFRAN Take 1 tablet (4 mg total) by mouth every 8 (eight) hours as needed for nausea or vomiting.   pantoprazole 40 MG tablet Commonly known as: PROTONIX Take 1 tablet (40 mg total) by mouth daily. Start taking on: September 18, 2023   predniSONE 20 MG tablet Commonly known as: DELTASONE Take 2 tablets (40 mg total) by mouth daily before breakfast for 4 days. Start taking on: September 18, 2023   traMADol 50 MG tablet Commonly known as: ULTRAM Take 1 tablet (50 mg total) by mouth every 6 (six) hours as needed for up to 5 days for moderate pain (pain score 4-6) or severe pain (pain score 7-10).       Allergies  Allergen Reactions   Hydrocodone-Acetaminophen Itching   Hydromorphone Hcl Itching   Oxycodone-Acetaminophen Itching    Follow-up Information     [facility] [facility]. Call.   Contact information: [address] [place], [place] [address] [phone]  [employer] researcher, broadcasting/film/video. Call.   Contact information: [address] [place], [place] [address] [phone]        [employer]. Call.   Contact information: [address] [place], [place] [address] [phone]        [employer]. Call.   Contact information: [address] [place], [place] [address] [phone]        [facility]. Call.   Contact information: [address] [place], [place] [address] [phone]        [facility] [employer]. Call.   Why: Utility assistance/utility hotline: (220)288-2004 ext. 314 Contact information: [address] [employer] [phone]        [facility]. Call.   Contact informationSales executive (heating/cooling and water assistance) [phone]        [employer]. Call.   Contact information: [address] [place], [place] [address] [phone]        [facility] Call.   Contact information: Emergency Shelter 24/7 Hotline [phone]        [facility]. Call.   Contact information: Emergency Shelter [phone]                 The results of significant diagnostics from this hospitalization (including imaging, microbiology, ancillary and laboratory) are listed below for reference.    Significant Diagnostic Studies: DG Wrist Complete Left Result Date: 09/15/2023 CLINICAL DATA:  Left wrist pain, recent fracture EXAM: LEFT WRIST - COMPLETE 3+ VIEW COMPARISON:  X-ray 07/29/2023 FINDINGS: Subacute fractures of the distal radius and ulnar styloid. Prominent periosteal new bone formation at the distal radial fracture site. No significant change in alignment with persistent impaction and dorsal angulation. No definite callus formation at the ulnar styloid fracture site. No new fractures. No dislocation. Persistent soft tissue swelling about the wrist. IMPRESSION: Subacute fractures of the distal radius and ulnar styloid with no significant change in alignment. Electronically Signed   By: Duanne Guess D.O.   On: 09/15/2023 18:45   DG Lumbar Spine Complete Result Date: 09/15/2023 CLINICAL DATA:  Fall 1 month ago.  Low back pain EXAM: LUMBAR SPINE - COMPLETE 4+ VIEW COMPARISON:  04/11/2008 x-ray, 09/03/2023 CT FINDINGS: There is no evidence of lumbar spine fracture. Alignment is normal. Intervertebral disc spaces are maintained. IMPRESSION: Negative. Electronically Signed   By: Duanne Guess D.O.   On: 09/15/2023 18:43   DG Chest 2 View Result Date: 09/15/2023 CLINICAL DATA:  Shortness of breath EXAM: CHEST - 2 VIEW COMPARISON:  09/03/2023 FINDINGS: The heart size and mediastinal contours are within normal limits. Aortic atherosclerosis. Hyperinflated lungs.  No new superimposed airspace consolidation. No pleural effusion or pneumothorax. The visualized skeletal structures are unremarkable. IMPRESSION: Hyperinflated lungs. No new superimposed airspace consolidation. Electronically Signed   By: Duanne Guess D.O.   On: 09/15/2023 18:41   CT ABDOMEN PELVIS W CONTRAST Result Date: 09/03/2023 CLINICAL DATA:  Acute abdominal pain, cough EXAM: CT ABDOMEN AND PELVIS WITH CONTRAST TECHNIQUE: Multidetector CT imaging of the abdomen and pelvis was performed using the standard protocol following bolus administration of intravenous contrast. RADIATION DOSE REDUCTION: This exam was performed according to the departmental dose-optimization program which includes automated exposure control, adjustment of the mA and/or kV according to patient size and/or use of iterative reconstruction technique. CONTRAST:  80mL OMNIPAQUE IOHEXOL 300 MG/ML  SOLN COMPARISON:  03/19/2010 FINDINGS: Lower chest: Pulmonary emphysema. Focal bandlike airspace consolidation, atelectasis or scarring in the posterior basal segment left lower lobe. No pleural or pericardial effusion. Hepatobiliary: No focal liver abnormality is seen. Status post cholecystectomy. No biliary dilatation. Pancreas: Unremarkable. No pancreatic ductal dilatation or surrounding inflammatory changes. Spleen: Normal in size without focal abnormality. Adrenals/Urinary Tract: Adrenal glands are unremarkable. Kidneys are normal, without renal calculi, focal lesion, or hydronephrosis. Bladder is nondistended. Stomach/Bowel: Stomach decompressed. Small bowel nondilated. Clip near the base of the cecum. Appendix not identified. The colon is partially distended, without acute finding. Vascular/Lymphatic: No significant vascular findings are present. No enlarged abdominal or pelvic lymph nodes. Reproductive: Uterus and bilateral adnexa are unremarkable. Other: Pelvic phleboliths.  No ascites.  No free air. Musculoskeletal: Small anterior  endplate spurring at all levels in the lumbar spine. 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 IMPRESSION: 1. No acute abdominal findings. 2. Left lower lobe airspace consolidation, atelectasis or scarring. 3.  Emphysema (ICD10-J43.9). Electronically Signed   By: Corlis Leak M.D.   On: 09/03/2023 16:59   DG Chest 2 View Result Date: 09/03/2023 CLINICAL DATA:  Cough. EXAM: CHEST - 2 VIEW COMPARISON:  Chest radiograph dated 08/06/2023. FINDINGS: Background of emphysema. No focal consolidation, pleural effusion, or pneumothorax. The cardiac silhouette is within normal limits. No acute osseous pathology. Degenerative changes of the spine. IMPRESSION: 1. No active cardiopulmonary disease. 2. Emphysema. Electronically Signed   By: Elgie Collard M.D.   On: 09/03/2023 15:35    Microbiology: Recent Results (from the past 240 hours)  Respiratory (~20 pathogens) panel by PCR     Status: None   Collection Time: 09/15/23  8:43 PM   Specimen: Nasopharyngeal Swab; Respiratory  Result Value Ref Range Status   Adenovirus NOT DETECTED NOT DETECTED Final   Coronavirus 229E NOT DETECTED NOT DETECTED Final    Comment: (NOTE) The Coronavirus on the Respiratory Panel, DOES NOT test for the novel  Coronavirus (2019 nCoV)    Coronavirus HKU1 NOT DETECTED NOT DETECTED Final   Coronavirus NL63 NOT DETECTED NOT DETECTED Final   Coronavirus OC43 NOT DETECTED NOT DETECTED Final   Metapneumovirus NOT DETECTED NOT DETECTED Final   Rhinovirus / Enterovirus NOT DETECTED NOT DETECTED Final   Influenza A NOT DETECTED NOT DETECTED Final   Influenza B NOT DETECTED NOT DETECTED Final   Parainfluenza Virus 1 NOT DETECTED NOT DETECTED Final   Parainfluenza Virus 2 NOT DETECTED NOT DETECTED Final   Parainfluenza Virus 3 NOT DETECTED NOT DETECTED Final   Parainfluenza Virus 4 NOT DETECTED NOT DETECTED Final   Respiratory Syncytial Virus NOT DETECTED NOT DETECTED Final   Bordetella pertussis NOT  DETECTED NOT DETECTED Final   Bordetella Parapertussis NOT DETECTED NOT DETECTED Final   Chlamydophila pneumoniae NOT DETECTED NOT DETECTED Final   Mycoplasma pneumoniae NOT DETECTED NOT DETECTED Final    Comment: Performed at [facility], [address], [place], [place] [address]  Resp panel by RT-PCR (RSV, Flu A&B, Covid) Anterior Nasal Swab     Status: None   Collection Time: 09/15/23  8:43 PM   Specimen: Anterior Nasal Swab  Result Value Ref Range Status   SARS Coronavirus 2 by RT PCR NEGATIVE NEGATIVE Final    Comment: (NOTE) SARS-CoV-2 target nucleic acids are NOT DETECTED.  The SARS-CoV-2 RNA is generally detectable in upper respiratory specimens during the acute phase of infection. The lowest concentration of SARS-CoV-2 viral  copies this assay can detect is 138 copies/mL. A negative result does not preclude SARS-Cov-2 infection and should not be used as the sole basis for treatment or other patient management decisions. A negative result may occur with  improper specimen collection/handling, submission of specimen other than nasopharyngeal swab, presence of viral mutation(s) within the areas targeted by this assay, and inadequate number of viral copies(<138 copies/mL). A negative result must be combined with clinical observations, patient history, and epidemiological information. The expected result is Negative.  Fact Sheet for Patients:  BloggerCourse.com  Fact Sheet for Healthcare Providers:  SeriousBroker.it  This test is no t yet approved or cleared by the [place] FDA and  has been authorized for detection and/or diagnosis of SARS-CoV-2 by FDA under an Emergency Use Authorization (EUA). This EUA will remain  in effect (meaning this test can be used) for the duration of the COVID-19 declaration under Section 564(b)(1) of the Act, 21 U.S.C.section 360bbb-3(b)(1), unless the authorization is terminated  or  revoked sooner.       Influenza A by PCR NEGATIVE NEGATIVE Final   Influenza B by PCR NEGATIVE NEGATIVE Final    Comment: (NOTE) The Xpert Xpress SARS-CoV-2/FLU/RSV plus assay is intended as an aid in the diagnosis of influenza from Nasopharyngeal swab specimens and should not be used as a sole basis for treatment. Nasal washings and aspirates are unacceptable for Xpert Xpress SARS-CoV-2/FLU/RSV testing.  Fact Sheet for Patients: BloggerCourse.com  Fact Sheet for Healthcare Providers: SeriousBroker.it  This test is not yet approved or cleared by the [place] FDA and has been authorized for detection and/or diagnosis of SARS-CoV-2 by FDA under an Emergency Use Authorization (EUA). This EUA will remain in effect (meaning this test can be used) for the duration of the COVID-19 declaration under Section 564(b)(1) of the Act, 21 U.S.C. section 360bbb-3(b)(1), unless the authorization is terminated or revoked.     Resp Syncytial Virus by PCR NEGATIVE NEGATIVE Final    Comment: (NOTE) Fact Sheet for Patients: BloggerCourse.com  Fact Sheet for Healthcare Providers: SeriousBroker.it  This test is not yet approved or cleared by the [place] FDA and has been authorized for detection and/or diagnosis of SARS-CoV-2 by FDA under an Emergency Use Authorization (EUA). This EUA will remain in effect (meaning this test can be used) for the duration of the COVID-19 declaration under Section 564(b)(1) of the Act, 21 U.S.C. section 360bbb-3(b)(1), unless the authorization is terminated or revoked.  Performed at [facility], [address], [place], [place] [address]      Labs: Basic Metabolic Panel: Recent Labs  Lab 09/15/23 1728 09/16/23 0319  NA 143 140  K 3.3* 3.8  CL 107 106  CO2 23 27  GLUCOSE 93 200*  BUN 11 16  CREATININE 0.43* 0.43*  CALCIUM  8.0* 8.3*   Liver Function Tests: Recent Labs  Lab 09/16/23 0319  AST 24  ALT 18  ALKPHOS 75  BILITOT 0.5  PROT 5.8*  ALBUMIN 3.3*   No results for input(s): "LIPASE", "AMYLASE" in the last 168 hours. No results for input(s): "AMMONIA" in the last 168 hours. CBC: Recent Labs  Lab 09/15/23 1839 09/16/23 0319  WBC 4.2 4.0  NEUTROABS 3.0  --   HGB 13.5 12.5  HCT 42.8 38.8  MCV 100.5* 100.0  PLT 329 298   Cardiac Enzymes: No results for input(s): "CKTOTAL", "CKMB", "CKMBINDEX", "TROPONINI" in the last 168 hours. BNP: BNP (last 3 results) No results for input(s): "BNP" in  the last 8760 hours.  ProBNP (last 3 results) No results for input(s): "PROBNP" in the last 8760 hours.  CBG: Recent Labs  Lab 09/16/23 0743 09/16/23 1243 09/16/23 1709 09/17/23 0022 09/17/23 0559  GLUCAP 101* 127* 155* 117* 78    Signed:  Rhetta Mura MD   [facility] 09/17/2023, 10:08 AM

## 2023-09-18 ENCOUNTER — Telehealth: Payer: Self-pay

## 2023-09-18 NOTE — Transitions of Care (Post Inpatient/ED Visit) (Signed)
   09/18/2023  Name: Stacy Bolton MRN: [id number] DOB: 1970/12/13  Today's TOC FU Call Status: Today's TOC FU Call Status:: Unsuccessful Call (1st Attempt) Unsuccessful Call (1st Attempt) Date: 09/18/23  Attempted to reach the patient regarding the most recent Inpatient/ED visit.  Follow Up Plan: Additional outreach attempts will be made to reach the patient to complete the Transitions of Care (Post Inpatient/ED visit) call.   Signature  Robyne Peers, RN

## 2023-09-19 ENCOUNTER — Telehealth: Payer: Self-pay

## 2023-09-19 NOTE — Telephone Encounter (Signed)
After speaking with the patient this morning, I talked to Waynetta Sandy, RN/ [facility] to inquire if she is aware of the patient and her needs.  Jan had not seen her yet and as she went to look for her at the shelter she noted that the patient was having an argument with another resident and eventually the Director of the shelter has to intervene.   I tried to call the patient back this afternoon and I was not able to leave a message, the voicemail was not set up.

## 2023-09-19 NOTE — Transitions of Care (Post Inpatient/ED Visit) (Signed)
   09/19/2023  Name: Stacy Bolton MRN: [id number] DOB: 08/13/1971  Today's TOC FU Call Status: Today's TOC FU Call Status:: Successful TOC FU Call Completed Unsuccessful Call (1st Attempt) Date: 09/18/23 [facility] FU Call Complete Date: 09/19/23 Patient's Name and Date of Birth confirmed.  Transition Care Management Follow-up Telephone Call Date of Discharge: 09/17/23 Discharge Facility: [facility]) Type of Discharge: Inpatient Admission Primary Inpatient Discharge Diagnosis:: COPD How have you been since you were released from the hospital?: Better (She said she just feels a little lightheaded at times.) Any questions or concerns?: Yes Patient Questions/Concerns:: She said she has alot going on.  She said she is currently staying at the [facility] and she is working with Alcario Drought, case worker at [employer], to secure housing, However, she has no income.  Before I could compete the call,  she said she had to go and eat and needed to hang up. She asked that I call her back. Patient Questions/Concerns Addressed: Other: (She will continue to work with her caseworker at [employer])  Items Reviewed: Did you receive and understand the discharge instructions provided?: No (She said she was not given any written instructions/AVS) Medications obtained,verified, and reconciled?: No Medications Not Reviewed Reasons:: Other: (She said she has all of her medications and the staff at [facility] is helping her with her meds.) Any new allergies since your discharge?: No Dietary orders reviewed?: No Do you have support at home?: No (homeless, stated she has no fami;y or friends  to help her but she is currently staying at [employer])  Medications Reviewed Today: Medications Reviewed Today   Medications were not reviewed in this encounter     Home Care and Equipment/Supplies: Were Home Health Services Ordered?: No Any new equipment or medical supplies ordered?: No  Functional  Questionnaire: Do you need assistance with bathing/showering or dressing?: No Do you need assistance with meal preparation?: Yes (receives meals at the shelter) Do you need assistance with eating?: No Do you have difficulty maintaining continence: Yes (sometimes per record review) Do you need assistance with getting out of bed/getting out of a chair/moving?: No Do you have difficulty managing or taking your medications?: Yes (The [facility] staff is helping her)  Follow up appointments reviewed: PCP Follow-up appointment confirmed?: Yes Date of PCP follow-up appointment?: 10/02/23 Follow-up Provider: Gwinda Passe, NP is listed as PCP but she has never seen the patient. Specialist Hospital Follow-up appointment confirmed?: Yes Date of Specialist follow-up appointment?: 09/23/23 Follow-Up Specialty Provider:: orthopedics- [facility] Do you need transportation to your follow-up appointment?:  (Not discussed because she said she had to go and eat and needed to hang up. She asked that I call her back) Do you understand care options if your condition(s) worsen?:  (Not discussed because she said she had to go and eat and needed to hang up. She asked that I call her back)    SIGNATURE Robyne Peers, RN

## 2023-09-20 ENCOUNTER — Emergency Department (HOSPITAL_COMMUNITY): Payer: Medicaid Other

## 2023-09-20 ENCOUNTER — Ambulatory Visit (INDEPENDENT_AMBULATORY_CARE_PROVIDER_SITE_OTHER): Payer: Self-pay | Admitting: Primary Care

## 2023-09-20 ENCOUNTER — Encounter (HOSPITAL_COMMUNITY): Payer: Self-pay

## 2023-09-20 ENCOUNTER — Other Ambulatory Visit: Payer: Self-pay

## 2023-09-20 ENCOUNTER — Emergency Department (HOSPITAL_COMMUNITY)
Admission: EM | Admit: 2023-09-20 | Discharge: 2023-09-20 | Disposition: A | Discharge status: Home / Self Care | Payer: Medicaid Other | Attending: Emergency Medicine | Admitting: Emergency Medicine

## 2023-09-20 DIAGNOSIS — F1999 Other psychoactive substance use, unspecified with unspecified psychoactive substance-induced disorder: Secondary | ICD-10-CM | POA: Diagnosis not present

## 2023-09-20 DIAGNOSIS — S0990XA Unspecified injury of head, initial encounter: Secondary | ICD-10-CM | POA: Insufficient documentation

## 2023-09-20 DIAGNOSIS — F1990 Other psychoactive substance use, unspecified, uncomplicated: Secondary | ICD-10-CM

## 2023-09-20 NOTE — Telephone Encounter (Signed)
Copied from CRM [id number]. Topic: Appointments - Appointment Scheduling >> Sep 20, 2023  8:44 AM Phill Myron wrote: Pt Jackowski is requesting  a detox facility for cocaine/crack and alcohol  Chief Complaint: Wants to detox Symptoms: using crack cocaine Frequency: daily Pertinent Negatives: Patient denies fever, dt's.   Disposition: [x] ED /[] Urgent Care (no appt availability in office) / [] Appointment(In office/virtual)/ []  [place] [facility]/ [] Home Care/ [] Refused Recommended Disposition /[]  Mobile Bus/ []  Follow-up with PCP Additional Notes: Instructed to go to ER.  States wants to find a place to detox.  Was living in a shelter and now staying with a friend.  States she is serious about help.  Instructed to go to ER.  Care advise given, denies questions.   Reason for Disposition  Requesting to talk with a counselor (mental health worker, psychiatrist, etc.)  Answer Assessment - Initial Assessment Questions 1. DRUG: "What drug(s) are you using?"      Crack cocaine. 2. ROUTE: "How are you using this drug?" (e.g., swallow, smoke, inject, huff, snort)     Smoke  3. HOW OFTEN: "How many days per week do you typically use it?"     2-3 times per week 4. HOW MUCH: "How much do you use?"      "It just depends"  5. LAST 24 HOURS: "Have you used it in the last 24 hours?"     $70 worth 6. ALCOHOL USE PROBLEM: "Do you have or have you ever had a problem with drinking too much alcohol?"     yes 7. SYMPTOMS: "What symptoms are you currently experiencing?" (e.g., none; abdomen pain, chest pain, headache, vomiting)     Yes, all of them  8. DETOX PROGRAM: "Have you ever gone through a substance use treatment program (detox program)?"     yes 9. THERAPIST: "Do you have a counselor or therapist?" If Yes, ask: "What is their name?"     denies 10. SUPPORT: "Who is with you now?" "Who do you live with?" "Do you have family or friends who you can talk to?"        Living with  a friend.  Protocols used: Substance Use and Problems-A-AH
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
## 2023-09-20 NOTE — Progress Notes (Signed)
TOC consulted for substance abuse resources. Resources attached to AVS. No further TOC needs.

## 2023-09-25 ENCOUNTER — Other Ambulatory Visit: Payer: Self-pay | Admitting: Orthopedic Surgery

## 2023-10-02 ENCOUNTER — Encounter (INDEPENDENT_AMBULATORY_CARE_PROVIDER_SITE_OTHER): Payer: Self-pay | Admitting: Primary Care

## 2023-10-02 ENCOUNTER — Ambulatory Visit (INDEPENDENT_AMBULATORY_CARE_PROVIDER_SITE_OTHER): Payer: Medicaid Other | Admitting: Primary Care

## 2023-10-02 VITALS — BP 119/78 | HR 82 | Resp 16 | Ht 65.0 in | Wt 103.0 lb

## 2023-10-02 DIAGNOSIS — R32 Unspecified urinary incontinence: Secondary | ICD-10-CM

## 2023-10-02 DIAGNOSIS — R159 Full incontinence of feces: Secondary | ICD-10-CM

## 2023-10-02 DIAGNOSIS — Z2821 Immunization not carried out because of patient refusal: Secondary | ICD-10-CM

## 2023-10-02 DIAGNOSIS — Z23 Encounter for immunization: Secondary | ICD-10-CM

## 2023-10-02 DIAGNOSIS — Z7689 Persons encountering health services in other specified circumstances: Secondary | ICD-10-CM

## 2023-10-02 DIAGNOSIS — F1920 Other psychoactive substance dependence, uncomplicated: Secondary | ICD-10-CM | POA: Diagnosis not present

## 2023-10-02 DIAGNOSIS — E43 Unspecified severe protein-calorie malnutrition: Secondary | ICD-10-CM

## 2023-10-02 DIAGNOSIS — Z09 Encounter for follow-up examination after completed treatment for conditions other than malignant neoplasm: Secondary | ICD-10-CM
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
## 2023-10-02 NOTE — Patient Instructions (Signed)
Pneumococcal Conjugate Vaccine: What You Need to Know Many vaccine information statements are available in Spanish and other languages. See PromoAge.com.br. 1. Why get vaccinated? Pneumococcal conjugate vaccine can prevent pneumococcal disease. Pneumococcal disease refers to any illness caused by pneumococcal bacteria. These bacteria can cause many types of illnesses, including pneumonia, which is an infection of the lungs. Pneumococcal bacteria are one of the most common causes of pneumonia. Besides pneumonia, pneumococcal bacteria can also cause: Ear infections Sinus infections Meningitis (infection of the tissue covering the brain and spinal cord) Bacteremia (infection of the blood) Anyone can get pneumococcal disease, but children under 13 years old, people with certain medical conditions or other risk factors, and adults 55 years or older are at the highest risk. Most pneumococcal infections are mild. However, some can result in long-term problems, such as brain damage or hearing loss. Meningitis, bacteremia, and pneumonia caused by pneumococcal disease can be fatal. 2. Pneumococcal conjugate vaccine Pneumococcal conjugate vaccine helps protect against bacteria that cause pneumococcal disease. There are three pneumococcal conjugate vaccines (PCV13, PCV15, and PCV20). The different vaccines are recommended for different people based on age and medical status. Your health care provider can help you determine which type of pneumococcal conjugate vaccine, and how many doses, you should receive. Infants and young children usually need 4 doses of pneumococcal conjugate vaccine. These doses are recommended at 2, 4, 6, and 55-29 months of age. Older children and adolescents might need pneumococcal conjugate vaccine depending on their age and medical conditions or other risk factors if they did not receive the recommended doses as infants or young children. Adults 55 through 11 years old with certain  medical conditions or other risk factors who have not already received pneumococcal conjugate vaccine should receive pneumococcal conjugate vaccine. Adults 65 years or older who have not previously received pneumococcal conjugate vaccine should receive pneumococcal conjugate vaccine. Some people with certain medical conditions are also recommended to receive pneumococcal polysaccharide vaccine (a different type of pneumococcal vaccine known as PPSV23). Some adults who have previously received a pneumococcal conjugate vaccine may be recommended to receive another pneumococcal conjugate vaccine. 3. Talk with your health care provider Tell your vaccination provider if the person getting the vaccine: Has had an allergic reaction after a previous dose of any type of pneumococcal conjugate vaccine (PCV13, PCV15, PCV20, or an earlier pneumococcal conjugate vaccine known as PCV7), or to any vaccine containing diphtheria toxoid (for example, DTaP), or has any severe, life-threatening allergies In some cases, your health care provider may decide to postpone pneumococcal conjugate vaccination until a future visit. People with minor illnesses, such as a cold, may be vaccinated. People who are moderately or severely ill should usually wait until they recover. Your health care provider can give you more information. 4. Risks of a vaccine reaction Redness, swelling, pain, or tenderness where the shot is given, and fever, loss of appetite, fussiness (irritability), feeling tired, headache, muscle aches, joint pain, and chills can happen after pneumococcal conjugate vaccination. Young children may be at increased risk for seizures caused by fever after a pneumococcal conjugate vaccine if it is administered at the same time as inactivated influenza vaccine. Ask your health care provider for more information. People sometimes faint after medical procedures, including vaccination. Tell your provider if you feel dizzy or  have vision changes or ringing in the ears. As with any medicine, there is a very remote chance of a vaccine causing a severe allergic reaction, other serious injury, or death. 5.  What if there is a serious problem? An allergic reaction could occur after the vaccinated person leaves the clinic. If you see signs of a severe allergic reaction (hives, swelling of the face and throat, difficulty breathing, a fast heartbeat, dizziness, or weakness), call 9-1-1 and get the person to the nearest hospital. For other signs that concern you, call your health care provider. Adverse reactions should be reported to the Vaccine Adverse Event Reporting System (VAERS). Your health care provider will usually file this report, or you can do it yourself. Visit the VAERS website at www.vaers.LAgents.no or call 913-439-3438. VAERS is only for reporting reactions, and VAERS staff members do not give medical advice. 6. The National Vaccine Injury Compensation Program The [employer] Vaccine Injury Compensation Program (VICP) is a federal program that was created to compensate people who may have been injured by certain vaccines. Claims regarding alleged injury or death due to vaccination have a time limit for filing, which may be as short as two years. Visit the VICP website at SpiritualWord.at or call 860 253 0829 to learn about the program and about filing a claim. 7. How can I learn more? Ask your health care provider. Call your local or state health department. Visit the website of the Food and Drug Administration (FDA) for vaccine package inserts and additional information at FinderList.no. Contact the Centers for Disease Control and Prevention (CDC): Call 4055360835 (1-800-CDC-INFO) or Visit CDC's website at PicCapture.uy. Source: CDC Vaccine Information Statement (Interim) Pneumococcal Conjugate Vaccine (01/05/2022) This same material is available at  FootballExhibition.com.br for no charge. This information is not intended to replace advice given to you by your health care provider. Make sure you discuss any questions you have with your health care provider. Document Revised: 11/28/2022 Document Reviewed: 09/03/2022 Elsevier Patient Education  2024 [employer].

## 2023-10-03 ENCOUNTER — Encounter: Payer: Self-pay | Admitting: Critical Care Medicine

## 2023-10-03 ENCOUNTER — Other Ambulatory Visit: Payer: Self-pay

## 2023-10-03 ENCOUNTER — Other Ambulatory Visit: Payer: Self-pay | Admitting: Physician Assistant

## 2023-10-03 ENCOUNTER — Telehealth (INDEPENDENT_AMBULATORY_CARE_PROVIDER_SITE_OTHER): Payer: Self-pay | Admitting: Licensed Clinical Social Worker

## 2023-10-03 MED ORDER — LEVOTHYROXINE SODIUM 25 MCG PO TABS
25.0000 ug | ORAL_TABLET | Freq: Every day | ORAL | 1 refills | Status: DC
  Filled 2023-10-03: qty 60, 60d supply, fill #0
  Filled 2024-01-09: qty 60, 60d supply, fill #1

## 2023-10-03 NOTE — Telephone Encounter (Signed)
 Patient was referred from PCP via teams.  Patient was spiraling on the call she seemed to be very panicky and was looking for a way to detox.  Patient was referred to behavioral health and/or the emergency room to detox.  Therapist was tried to link patient with a rehab program but patient refused patient stated she did not need rehab she only requested detox so that she can get a surgery on her hand.  Patient seemed very agitated.  Unfortunately therapist had to leave due to not emergency patient was referred to behavior health by notifying [facility] staff

## 2023-10-03 NOTE — Progress Notes (Signed)
 Patient scheduled for surgery at [facility] with Dr. Elicia Ground on 10/11/23. Due to recent ED visits for COPD exacerbation, SOB, and polysubstance abuse, chart reviewed with Dr. Allean Island. Per [facility] surgery will be moved to [facility]. Sallyanne Creamer made aware.

## 2023-10-03 NOTE — Progress Notes (Signed)
 Just saw new pcp yesterday  Multiple requests:  Reader eye glasses:  given Refill thyroid  med :  done Needs rollator: will evaluate

## 2023-10-04 ENCOUNTER — Encounter: Payer: Self-pay | Admitting: *Deleted

## 2023-10-04 ENCOUNTER — Ambulatory Visit (HOSPITAL_COMMUNITY)
Admission: RE | Admit: 2023-10-04 | Discharge: 2023-10-04 | Disposition: A | Discharge status: Home / Self Care | Payer: Medicaid Other | Source: Ambulatory Visit | Attending: Emergency Medicine | Admitting: Emergency Medicine

## 2023-10-04 ENCOUNTER — Ambulatory Visit (INDEPENDENT_AMBULATORY_CARE_PROVIDER_SITE_OTHER): Payer: Medicaid Other

## 2023-10-04 ENCOUNTER — Other Ambulatory Visit (HOSPITAL_COMMUNITY): Payer: Self-pay

## 2023-10-04 ENCOUNTER — Encounter (HOSPITAL_COMMUNITY): Payer: Self-pay

## 2023-10-04 VITALS — BP 121/87 | HR 99 | Temp 98.5°F | Resp 20

## 2023-10-04 DIAGNOSIS — J4520 Mild intermittent asthma, uncomplicated: Secondary | ICD-10-CM | POA: Insufficient documentation

## 2023-10-04 DIAGNOSIS — J441 Chronic obstructive pulmonary disease with (acute) exacerbation: Secondary | ICD-10-CM | POA: Diagnosis present

## 2023-10-04 MED ORDER — IPRATROPIUM-ALBUTEROL 0.5-2.5 (3) MG/3ML IN SOLN
3.0000 mL | Freq: Once | RESPIRATORY_TRACT | Status: AC
  Administered 2023-10-04: 3 mL via RESPIRATORY_TRACT

## 2023-10-04 MED ORDER — IPRATROPIUM-ALBUTEROL 0.5-2.5 (3) MG/3ML IN SOLN
RESPIRATORY_TRACT | Status: AC
Start: 2023-10-04 — End: ?
  Filled 2023-10-04: qty 3

## 2023-10-04 MED ORDER — LEVOFLOXACIN 500 MG PO TABS
500.0000 mg | ORAL_TABLET | Freq: Every day | ORAL | 0 refills | Status: DC
  Filled 2023-10-04: qty 5, 5d supply, fill #0

## 2023-10-04 MED ORDER — PREDNISONE 20 MG PO TABS
40.0000 mg | ORAL_TABLET | Freq: Every day | ORAL | 0 refills | Status: AC
  Filled 2023-10-04: qty 10, 5d supply, fill #0

## 2023-10-04 NOTE — ED Triage Notes (Signed)
 Patient states she is needing a clearance for her wrist surgery. She had been I the hospital for pneumonia and norovirus. States she recently has now had her pneumonia vaccine.   Patient having some arthritic joint pain. Has a chronic cough and SOB from COPD and emphysema.

## 2023-10-04 NOTE — Congregational Nurse Program (Signed)
  Dept: [phone]   Congregational Nurse Program Note  Date of Encounter: 10/03/2023  Past Medical History: Past Medical History:  Diagnosis Date   Anxiety    Asthma    COPD (chronic obstructive pulmonary disease) (HCC)    Dyspnea    Endometriosis    Seizure [facility])     Encounter Details:  Community Questionnaire - 10/03/23 1400       Questionnaire   Ask client: Do you give verbal consent for me to treat you today? Yes    Student Assistance [employer] Nurse    Location Patient Served  [facility]    Encounter Setting CN site    Population Status Unhoused    Insurance Medicaid    Insurance/Financial Assistance Referral N/A    Medication N/A    Medical Provider Yes    Screening Referrals Made N/A    Medical Referrals Made [facility];Urgent Care    Medical Appointment Completed [facility]    CNP Interventions Advocate/Support;Navigate Healthcare System;Case Management    ED Visit Averted N/A    Life-Saving Intervention Made N/A           Client came to [facility] clinic requesting assistance with reading glasses, thyroid  medication and poise pads. Completed triage to see MD. Gave poise pads as requested. Client was seen by [facility] MD and writer received message to pick up medication for client and bring to [facility]. Picked up medication and brought to client. Reviewed medication with client. When client received medication she said she needed a medical clearance to have surgery. Referred to her PCP and client reports she does not want to see her PCP as she was upset with her previous visit. Client declined assistance with substance abuse programs. Referred to Urgent Care as client is requesting lab work. Made an appointment for Feb 7th and gave bus passes for transportation.  Evangeline Utley W RN CN

## 2023-10-04 NOTE — ED Provider Notes (Signed)
 [facility]    CSN: [id number] Arrival date & time: 10/04/23  1049      History   Chief Complaint Chief Complaint  Patient presents with   Appointment   Shortness of Breath   Joint Pain    HPI Stacy Bolton is a 53 y.o. female.   Patient presents to clinic requesting clearance for wrist surgery.  She has been having some generalized bodyaches, fatigue, hot and cold chills, cough and shortness of breath recently.  Symptoms have been ongoing for the past month or so.  She does live in a shelter currently and has issues with substance abuse, last used a week and a half ago.  She is having trouble obtaining clearance for surgery due to her drug abuse.  Endorses wheezing and shortness of breath, worse than her baseline.  Cough is productive with a yellow phlegm.  Has been using maintenance and rescue inhalers as prescribed.  The history is provided by the patient and medical records.  Shortness of Breath   Past Medical History:  Diagnosis Date   Anxiety    Asthma    COPD (chronic obstructive pulmonary disease) (HCC)    Dyspnea    Endometriosis    Seizure (HCC)     Patient Active Problem List   Diagnosis Date Noted   Hypokalemia 09/15/2023   History of wrist fracture 09/15/2023   History of cigarette smoking 09/15/2023   Hypothyroidism 09/15/2023   Steroid-induced hyperglycemia 09/15/2023   Malingerer 08/16/2023   Vaginal prolapse 08/13/2023   Homelessness 08/13/2023   Shortness of breath 08/10/2023   Bacteremia 08/09/2023   Community acquired pneumonia of right middle lobe of lung 08/06/2023   Polysubstance use disorder 08/06/2023   Closed fracture of distal end of left radius 08/06/2023   Protein-calorie malnutrition, severe 04/29/2023   Acute respiratory failure with hypoxia and hypercapnia (HCC) 04/28/2023   Bipolar I disorder, most recent episode (or current) manic (HCC) 01/31/2023   Cocaine abuse (HCC) 01/31/2023   Acute psychosis (HCC)  01/30/2023   COPD with acute exacerbation (HCC) 01/16/2023   NAUSEA 03/27/2010   Bipolar disorder (HCC) 03/21/2010   GAD (generalized anxiety disorder) 03/21/2010   Asthma 03/21/2010   Pyelonephritis 03/21/2010   History of seizure 03/21/2010   Closed fracture of bone 03/21/2010    Past Surgical History:  Procedure Laterality Date   CHOLECYSTECTOMY      OB History   No obstetric history on file.      Home Medications    Prior to Admission medications   Medication Sig Start Date End Date Taking? Authorizing Provider  acetaminophen  (TYLENOL ) 500 MG tablet Take 1,000 mg by mouth as needed for mild pain (pain score 1-3) or moderate pain (pain score 4-6).   Yes [provider]  albuterol  (PROVENTIL  HFA) 108 (90 Base) MCG/ACT inhaler Inhale 2 puffs into the lungs every 6 (six) hours as needed for wheezing or shortness of breath. 08/17/23  Yes Gonfa, Taye T, MD  budesonide -formoterol  (SYMBICORT ) 160-4.5 MCG/ACT inhaler Inhale 2 puffs into the lungs in the morning and at bedtime. 08/17/23  Yes Gonfa, Taye T, MD  guaiFENesin  (MUCINEX ) 600 MG 12 hr tablet Take 1 tablet (600 mg total) by mouth 2 (two) times daily. 09/17/23  Yes Samtani, Jai-Gurmukh, MD  levofloxacin  (LEVAQUIN ) 500 MG tablet Take 1 tablet (500 mg total) by mouth daily. 10/04/23  Yes Joesphine Schemm  N, FNP  levothyroxine  (SYNTHROID ) 25 MCG tablet Take 1 tablet (25 mcg total) by mouth daily before  breakfast. 10/03/23  Yes Barrett, Shona MATSU, PA-C  predniSONE  (DELTASONE ) 20 MG tablet Take 2 tablets (40 mg total) by mouth daily for 5 days. 10/04/23 10/09/23 Yes Siennah Barrasso  N, FNP  azithromycin  (ZITHROMAX ) 500 MG tablet Take as directed to finish course 09/18/23   Samtani, Jai-Gurmukh, MD  ibuprofen  (ADVIL ) 400 MG tablet Take 1 tablet (400 mg total) by mouth every 6 (six) hours as needed for mild pain (pain score 1-3) (or Fever >/= 101). 09/17/23   Samtani, Jai-Gurmukh, MD  ondansetron  (ZOFRAN ) 4 MG tablet Take 1 tablet (4 mg  total) by mouth every 8 (eight) hours as needed for nausea or vomiting. 09/03/23   Keith, Kayla N, PA-C  pantoprazole  (PROTONIX ) 40 MG tablet Take 1 tablet (40 mg total) by mouth daily. 09/18/23   Samtani, Jai-Gurmukh, MD    Family History History reviewed. No pertinent family history.  Social History Social History   Tobacco Use   Smoking status: Every Day    Current packs/day: 1.50    Average packs/day: 1.5 packs/day for 30.0 years (45.0 ttl pk-yrs)    Types: Cigarettes  Vaping Use   Vaping status: Never Used  Substance Use Topics   Alcohol use: Yes    Comment: last drink 08/05/23   Drug use: Yes    Types: Cocaine, Marijuana    Comment: last used 08/05/23     Allergies   Hydrocodone -acetaminophen , Hydromorphone hcl, and Oxycodone -acetaminophen    Review of Systems Review of Systems  Per HPI   Physical Exam Triage Vital Signs ED Triage Vitals  Encounter Vitals Group     BP 10/04/23 1118 121/87     Systolic BP Percentile --      Diastolic BP Percentile --      Pulse Rate 10/04/23 1118 99     Resp 10/04/23 1118 20     Temp 10/04/23 1118 98.5 F (36.9 C)     Temp Source 10/04/23 1118 Oral     SpO2 10/04/23 1118 90 %     Weight --      Height --      Head Circumference --      Peak Flow --      Pain Score 10/04/23 1117 9     Pain Loc --      Pain Education --      Exclude from Growth Chart --    No data found.  Updated Vital Signs BP 121/87 (BP Location: Left Arm)   Pulse 99   Temp 98.5 F (36.9 C) (Oral)   Resp 20   LMP 10/13/2011   SpO2 90%   Visual Acuity Right Eye Distance:   Left Eye Distance:   Bilateral Distance:    Right Eye Near:   Left Eye Near:    Bilateral Near:     Physical Exam Vitals and nursing note reviewed.  Constitutional:      Appearance: Normal appearance. She is well-developed.  HENT:     Head: Normocephalic and atraumatic.     Right Ear: External ear normal.     Left Ear: External ear normal.     Nose: Congestion and  rhinorrhea present.     Mouth/Throat:     Mouth: Mucous membranes are moist.     Pharynx: Posterior oropharyngeal erythema present.  Eyes:     Conjunctiva/sclera: Conjunctivae normal.  Cardiovascular:     Rate and Rhythm: Normal rate and regular rhythm.  Pulmonary:     Effort: Pulmonary effort is normal.  Breath sounds: Decreased breath sounds and wheezing present.  Musculoskeletal:        General: Normal range of motion.  Skin:    General: Skin is warm and dry.  Neurological:     General: No focal deficit present.     Mental Status: She is alert and oriented to person, place, and time.  Psychiatric:        Mood and Affect: Mood normal.        Behavior: Behavior normal.      UC Treatments / Results  Labs (all labs ordered are listed, but only abnormal results are displayed) Labs Reviewed  SARS CORONAVIRUS 2 (TAT 6-24 HRS)    EKG   Radiology No results found.  Procedures Procedures (including critical care time)  Medications Ordered in UC Medications  ipratropium-albuterol  (DUONEB) 0.5-2.5 (3) MG/3ML nebulizer solution 3 mL (3 mLs Nebulization Given 10/04/23 1222)    Initial Impression / Assessment and Plan / UC Course  I have reviewed the triage vital signs and the nursing notes.  Pertinent labs & imaging results that were available during my care of the patient were reviewed by me and considered in my medical decision making (see chart for details).  Vitals and triage reviewed, patient is hemodynamically stable. Lungs with diffuse wheezing and diminished lung sounds. CXR shows no acute cardiopulmonary disease.  Has had multiple COPD exacerbations within the past few months, will treat with Levaquin .  Feeling better after DuoNeb.  Will send in steroid burst.  Discussed we do not do surgical clearance here in clinic and she should follow-up with her primary care provider for this clearance.  Swab for COVID-19 per patient request.  Staff will contact if positive.   Plan of care, follow-up care return precautions given, no questions at this time.     Final Clinical Impressions(s) / UC Diagnoses   Final diagnoses:  COPD with acute exacerbation (HCC)  Mild intermittent asthma, unspecified whether complicated     Discharge Instructions      I have sent in antibiotics and steroids to your pharmacy to treat your COPD exacerbation.  Continue to use your albuterol  inhaler as needed for rescue.  We have swabbed you for COVID-19 and we will contact you if the results are positive.  Please follow-up with your primary care provider regarding your drug screening for surgery.  Return to clinic for any new or urgent symptoms.     ED Prescriptions     Medication Sig Dispense Auth. Provider   levofloxacin  (LEVAQUIN ) 500 MG tablet Take 1 tablet (500 mg total) by mouth daily. 5 tablet Dreama, Annelyse Rey  N, FNP   predniSONE  (DELTASONE ) 20 MG tablet Take 2 tablets (40 mg total) by mouth daily for 5 days. 10 tablet Dreama Eleazar SAILOR, FNP      PDMP not reviewed this encounter.   Dreama, Gardiner Espana  N, FNP 10/04/23 1320

## 2023-10-04 NOTE — Discharge Instructions (Signed)
 I have sent in antibiotics and steroids to your pharmacy to treat your COPD exacerbation.  Continue to use your albuterol  inhaler as needed for rescue.  We have swabbed you for COVID-19 and we will contact you if the results are positive.  Please follow-up with your primary care provider regarding your drug screening for surgery.  Return to clinic for any new or urgent symptoms.

## 2023-10-05 LAB — SARS CORONAVIRUS 2 (TAT 6-24 HRS): SARS Coronavirus 2: NEGATIVE

## 2023-10-09 ENCOUNTER — Encounter: Payer: Self-pay | Admitting: Physician Assistant

## 2023-10-09 ENCOUNTER — Ambulatory Visit: Payer: Medicaid Other | Admitting: Nurse Practitioner

## 2023-10-09 NOTE — Congregational Nurse Program (Signed)
  Dept: [phone]   Congregational Nurse Program Note  Date of Encounter: 10/08/2023  Clinic visit for complaint of cough, temperature 98.8, respirations 16, O2 Sat 92%.  Encouraged to continue medication ordered by MD 10/04/23 and if not improved or symptoms worsen to notify PCP office.  Past Medical History: Past Medical History:  Diagnosis Date   Anxiety    Asthma    COPD (chronic obstructive pulmonary disease) (HCC)    Dyspnea    Endometriosis    Seizure [facility])     Encounter Details:  Community Questionnaire - 10/08/23 0940       Questionnaire   Ask client: Do you give verbal consent for me to treat you today? Yes    Student Assistance N/A    Location Patient Served  [facility]    Encounter Setting CN site    Population Status Unhoused    Insurance Medicaid    Insurance/Financial Assistance Referral N/A    Medication N/A    Medical Provider Yes    Screening Referrals Made N/A    Medical Referrals Made N/A    Medical Appointment Completed N/A    CNP Interventions Advocate/Support;Navigate Healthcare System;Case Management    Screenings CN Performed N/A    ED Visit Averted N/A    Life-Saving Intervention Made N/A

## 2023-10-09 NOTE — Progress Notes (Signed)
Pt did not make PCP appt today because her ride did not show up.   Pt wants the surgery, but has to have PCP clearance.   She has another appt 03/14, says will make that.   Coughing some, given cough drops.   C/o constipation, given Dulcolax and probiotics.  C/o pain, given Tylenol.   Theodore Demark, PA-C 10/09/2023 4:09 PM

## 2023-10-11 ENCOUNTER — Ambulatory Visit (HOSPITAL_BASED_OUTPATIENT_CLINIC_OR_DEPARTMENT_OTHER): Admission: RE | Admit: 2023-10-11 | Payer: Medicaid Other | Source: Home / Self Care | Admitting: Orthopedic Surgery

## 2023-10-11 ENCOUNTER — Encounter (HOSPITAL_BASED_OUTPATIENT_CLINIC_OR_DEPARTMENT_OTHER): Admission: RE | Payer: Self-pay | Source: Home / Self Care

## 2023-10-11 SURGERY — OPEN REDUCTION INTERNAL FIXATION (ORIF) DISTAL RADIUS FRACTURE
Anesthesia: Choice | Laterality: Left

## 2023-10-14 ENCOUNTER — Encounter: Payer: Self-pay | Admitting: *Deleted

## 2023-10-14 NOTE — Congregational Nurse Program (Signed)
  Dept: [phone]   Congregational Nurse Program Note  Date of Encounter: 10/14/2023  Past Medical History: Past Medical History:  Diagnosis Date   Anxiety    Asthma    COPD (chronic obstructive pulmonary disease) (HCC)    Dyspnea    Endometriosis    Seizure [facility])     Encounter Details:  Community Questionnaire - 10/14/23 1205       Questionnaire   Ask client: Do you give verbal consent for me to treat you today? Yes    Student Assistance N/A    Encounter Setting CN site    Population Status Unhoused    Insurance Medicaid    Insurance/Financial Assistance Referral N/A    Medication N/A    Medical Provider Yes    Screening Referrals Made N/A    Medical Referrals Made N/A    Medical Appointment Completed N/A    CNP Interventions Advocate/Support    Screenings CN Performed N/A    ED Visit Averted N/A    Life-Saving Intervention Made N/A           Client requesting poise pads and muscle milk. Given as requested. No other requests/concerns today. Shauntell Iglesia W RN CN

## 2023-10-15 ENCOUNTER — Other Ambulatory Visit: Payer: Self-pay

## 2023-10-15 ENCOUNTER — Emergency Department (HOSPITAL_COMMUNITY): Payer: Medicaid Other

## 2023-10-15 ENCOUNTER — Inpatient Hospital Stay (HOSPITAL_COMMUNITY)
Admission: EM | Admit: 2023-10-15 | Discharge: 2023-10-21 | DRG: 193 | Disposition: A | Discharge status: Home / Self Care | Payer: Medicaid Other | Attending: Internal Medicine | Admitting: Internal Medicine

## 2023-10-15 ENCOUNTER — Encounter (HOSPITAL_COMMUNITY): Payer: Self-pay

## 2023-10-15 ENCOUNTER — Ambulatory Visit (INDEPENDENT_AMBULATORY_CARE_PROVIDER_SITE_OTHER): Payer: Self-pay | Admitting: Primary Care

## 2023-10-15 DIAGNOSIS — Y929 Unspecified place or not applicable: Secondary | ICD-10-CM

## 2023-10-15 DIAGNOSIS — J9601 Acute respiratory failure with hypoxia: Secondary | ICD-10-CM | POA: Diagnosis present

## 2023-10-15 DIAGNOSIS — K219 Gastro-esophageal reflux disease without esophagitis: Secondary | ICD-10-CM | POA: Diagnosis present

## 2023-10-15 DIAGNOSIS — W19XXXA Unspecified fall, initial encounter: Secondary | ICD-10-CM | POA: Diagnosis present

## 2023-10-15 DIAGNOSIS — R0902 Hypoxemia: Secondary | ICD-10-CM | POA: Diagnosis present

## 2023-10-15 DIAGNOSIS — J441 Chronic obstructive pulmonary disease with (acute) exacerbation: Secondary | ICD-10-CM | POA: Diagnosis present

## 2023-10-15 DIAGNOSIS — Z87891 Personal history of nicotine dependence: Secondary | ICD-10-CM

## 2023-10-15 DIAGNOSIS — E860 Dehydration: Secondary | ICD-10-CM | POA: Diagnosis present

## 2023-10-15 DIAGNOSIS — Z79890 Hormone replacement therapy (postmenopausal): Secondary | ICD-10-CM

## 2023-10-15 DIAGNOSIS — Z5900 Homelessness unspecified: Secondary | ICD-10-CM | POA: Diagnosis not present

## 2023-10-15 DIAGNOSIS — S6292XA Unspecified fracture of left wrist and hand, initial encounter for closed fracture: Secondary | ICD-10-CM | POA: Diagnosis present

## 2023-10-15 DIAGNOSIS — Z885 Allergy status to narcotic agent status: Secondary | ICD-10-CM | POA: Diagnosis not present

## 2023-10-15 DIAGNOSIS — Z7951 Long term (current) use of inhaled steroids: Secondary | ICD-10-CM | POA: Diagnosis not present

## 2023-10-15 DIAGNOSIS — F419 Anxiety disorder, unspecified: Secondary | ICD-10-CM | POA: Diagnosis present

## 2023-10-15 DIAGNOSIS — Z79899 Other long term (current) drug therapy: Secondary | ICD-10-CM | POA: Diagnosis not present

## 2023-10-15 DIAGNOSIS — J159 Unspecified bacterial pneumonia: Secondary | ICD-10-CM | POA: Diagnosis present

## 2023-10-15 DIAGNOSIS — J189 Pneumonia, unspecified organism: Principal | ICD-10-CM

## 2023-10-15 DIAGNOSIS — E039 Hypothyroidism, unspecified: Secondary | ICD-10-CM | POA: Diagnosis present

## 2023-10-15 DIAGNOSIS — J440 Chronic obstructive pulmonary disease with acute lower respiratory infection: Secondary | ICD-10-CM | POA: Diagnosis present

## 2023-10-15 LAB — COMPREHENSIVE METABOLIC PANEL
ALT: 18 U/L (ref 0–44)
AST: 21 U/L (ref 15–41)
Albumin: 3.9 g/dL (ref 3.5–5.0)
Alkaline Phosphatase: 76 U/L (ref 38–126)
Anion gap: 10 (ref 5–15)
BUN: 8 mg/dL (ref 6–20)
CO2: 27 mmol/L (ref 22–32)
Calcium: 8.7 mg/dL — ABNORMAL LOW (ref 8.9–10.3)
Chloride: 97 mmol/L — ABNORMAL LOW (ref 98–111)
Creatinine, Ser: 0.47 mg/dL (ref 0.44–1.00)
GFR, Estimated: >60 mL/min (ref >60)
Glucose, Bld: 98 mg/dL (ref 70–99)
Potassium: 3.5 mmol/L (ref 3.5–5.1)
Sodium: 134 mmol/L — ABNORMAL LOW (ref 135–145)
Total Bilirubin: 0.7 mg/dL (ref 0.0–1.2)
Total Protein: 7 g/dL (ref 6.5–8.1)

## 2023-10-15 LAB — CBC WITH DIFFERENTIAL/PLATELET
Abs Immature Granulocytes: 0.07 10*3/uL (ref 0.00–0.07)
Basophils Absolute: 0.1 10*3/uL (ref 0.0–0.1)
Basophils Relative: 0 %
Eosinophils Absolute: 0 10*3/uL (ref 0.0–0.5)
Eosinophils Relative: 0 %
HCT: 43.8 % (ref 36.0–46.0)
Hemoglobin: 14.1 g/dL (ref 12.0–15.0)
Immature Granulocytes: 1 %
Lymphocytes Relative: 10 %
Lymphs Abs: 1.4 10*3/uL (ref 0.7–4.0)
MCH: 32.3 pg (ref 26.0–34.0)
MCHC: 32.2 g/dL (ref 30.0–36.0)
MCV: 100.2 fL — ABNORMAL HIGH (ref 80.0–100.0)
Monocytes Absolute: 1.4 10*3/uL — ABNORMAL HIGH (ref 0.1–1.0)
Monocytes Relative: 10 %
Neutro Abs: 11.3 10*3/uL — ABNORMAL HIGH (ref 1.7–7.7)
Neutrophils Relative %: 79 %
Platelets: 294 10*3/uL (ref 150–400)
RBC: 4.37 MIL/uL (ref 3.87–5.11)
RDW: 14.6 % (ref 11.5–15.5)
WBC: 14.3 10*3/uL — ABNORMAL HIGH (ref 4.0–10.5)
nRBC: 0 % (ref 0.0–0.2)

## 2023-10-15 LAB — I-STAT CHEM 8, ED
BUN: 5 mg/dL — ABNORMAL LOW (ref 6–20)
Calcium, Ion: 1.14 mmol/L — ABNORMAL LOW (ref 1.15–1.40)
Chloride: 96 mmol/L — ABNORMAL LOW (ref 98–111)
Creatinine, Ser: 0.6 mg/dL (ref 0.44–1.00)
Glucose, Bld: 92 mg/dL (ref 70–99)
HCT: 47 % — ABNORMAL HIGH (ref 36.0–46.0)
Hemoglobin: 16 g/dL — ABNORMAL HIGH (ref 12.0–15.0)
Potassium: 3.6 mmol/L (ref 3.5–5.1)
Sodium: 135 mmol/L (ref 135–145)
TCO2: 27 mmol/L (ref 22–32)

## 2023-10-15 LAB — RESP PANEL BY RT-PCR (RSV, FLU A&B, COVID)  RVPGX2
Influenza A by PCR: NEGATIVE
Influenza B by PCR: NEGATIVE
Resp Syncytial Virus by PCR: NEGATIVE
SARS Coronavirus 2 by RT PCR: NEGATIVE

## 2023-10-15 LAB — LACTIC ACID, PLASMA: Lactic Acid, Venous: 0.9 mmol/L (ref 0.5–1.9)

## 2023-10-15 MED ORDER — PANTOPRAZOLE SODIUM 40 MG PO TBEC
40.0000 mg | DELAYED_RELEASE_TABLET | Freq: Every day | ORAL | Status: DC
  Administered 2023-10-16 – 2023-10-21 (×6): 40 mg via ORAL
  Filled 2023-10-15 (×7): qty 1

## 2023-10-15 MED ORDER — LEVOTHYROXINE SODIUM 25 MCG PO TABS
25.0000 ug | ORAL_TABLET | Freq: Every day | ORAL | Status: DC
  Administered 2023-10-16 – 2023-10-21 (×6): 25 ug via ORAL
  Filled 2023-10-15 (×6): qty 1

## 2023-10-15 MED ORDER — IPRATROPIUM-ALBUTEROL 0.5-2.5 (3) MG/3ML IN SOLN
3.0000 mL | Freq: Four times a day (QID) | RESPIRATORY_TRACT | Status: DC
  Administered 2023-10-16 – 2023-10-17 (×4): 3 mL via RESPIRATORY_TRACT
  Filled 2023-10-15 (×5): qty 3

## 2023-10-15 MED ORDER — BUDESONIDE 0.25 MG/2ML IN SUSP
0.2500 mg | Freq: Two times a day (BID) | RESPIRATORY_TRACT | Status: DC
  Administered 2023-10-16 – 2023-10-21 (×11): 0.25 mg via RESPIRATORY_TRACT
  Filled 2023-10-15 (×11): qty 2

## 2023-10-15 MED ORDER — ENOXAPARIN SODIUM 40 MG/0.4ML IJ SOSY
40.0000 mg | PREFILLED_SYRINGE | INTRAMUSCULAR | Status: DC
  Administered 2023-10-16 – 2023-10-21 (×6): 40 mg via SUBCUTANEOUS
  Filled 2023-10-15 (×6): qty 0.4

## 2023-10-15 MED ORDER — ARFORMOTEROL TARTRATE 15 MCG/2ML IN NEBU
15.0000 ug | INHALATION_SOLUTION | Freq: Two times a day (BID) | RESPIRATORY_TRACT | Status: DC
  Administered 2023-10-16 – 2023-10-21 (×11): 15 ug via RESPIRATORY_TRACT
  Filled 2023-10-15 (×11): qty 2

## 2023-10-15 MED ORDER — SODIUM CHLORIDE 0.9 % IV BOLUS
1000.0000 mL | Freq: Once | INTRAVENOUS | Status: AC
  Administered 2023-10-15: 1000 mL via INTRAVENOUS

## 2023-10-15 MED ORDER — SODIUM CHLORIDE 0.9 % IV SOLN
2.0000 g | INTRAVENOUS | Status: DC
  Administered 2023-10-16 – 2023-10-21 (×5): 2 g via INTRAVENOUS
  Filled 2023-10-15 (×7): qty 20

## 2023-10-15 MED ORDER — SODIUM CHLORIDE 0.9 % IV SOLN
1.0000 g | Freq: Once | INTRAVENOUS | Status: AC
  Administered 2023-10-15: 1 g via INTRAVENOUS
  Filled 2023-10-15: qty 10

## 2023-10-15 MED ORDER — IPRATROPIUM-ALBUTEROL 0.5-2.5 (3) MG/3ML IN SOLN
3.0000 mL | Freq: Once | RESPIRATORY_TRACT | Status: AC
  Administered 2023-10-15: 3 mL via RESPIRATORY_TRACT
  Filled 2023-10-15: qty 3

## 2023-10-15 MED ORDER — ONDANSETRON HCL 4 MG PO TABS: 4.0000 mg | ORAL_TABLET | Freq: Four times a day (QID) | ORAL | Status: DC | PRN

## 2023-10-15 MED ORDER — AZITHROMYCIN 250 MG PO TABS
500.0000 mg | ORAL_TABLET | Freq: Once | ORAL | Status: DC
  Filled 2023-10-15: qty 2

## 2023-10-15 MED ORDER — AZITHROMYCIN 250 MG PO TABS
500.0000 mg | ORAL_TABLET | Freq: Every day | ORAL | Status: DC
Start: 2023-10-16 — End: 2023-10-21
  Administered 2023-10-16 – 2023-10-21 (×6): 500 mg via ORAL
  Filled 2023-10-15 (×7): qty 2

## 2023-10-15 MED ORDER — KETOROLAC TROMETHAMINE 15 MG/ML IJ SOLN
15.0000 mg | Freq: Once | INTRAMUSCULAR | Status: AC
  Administered 2023-10-15: 15 mg via INTRAVENOUS
  Filled 2023-10-15: qty 1

## 2023-10-15 MED ORDER — SODIUM CHLORIDE 0.9 % IV SOLN
500.0000 mg | Freq: Once | INTRAVENOUS | Status: AC
  Administered 2023-10-15: 500 mg via INTRAVENOUS
  Filled 2023-10-15: qty 5

## 2023-10-15 MED ORDER — IBUPROFEN 200 MG PO TABS
400.0000 mg | ORAL_TABLET | Freq: Four times a day (QID) | ORAL | Status: DC | PRN
  Administered 2023-10-16 – 2023-10-20 (×3): 400 mg via ORAL
  Filled 2023-10-15 (×3): qty 2

## 2023-10-15 MED ORDER — ONDANSETRON HCL 4 MG/2ML IJ SOLN: 4.0000 mg | Freq: Four times a day (QID) | INTRAMUSCULAR | Status: DC | PRN

## 2023-10-15 MED ORDER — AMOXICILLIN-POT CLAVULANATE 875-125 MG PO TABS
1.0000 | ORAL_TABLET | Freq: Once | ORAL | Status: DC
  Filled 2023-10-15: qty 1

## 2023-10-15 NOTE — Telephone Encounter (Signed)
 Noted

## 2023-10-15 NOTE — ED Triage Notes (Signed)
Cough, fever, chills, headache since last night. Pt states she was recently on abx last week for COPD exacerbation.

## 2023-10-15 NOTE — Congregational Nurse Program (Signed)
  Dept: [phone]   Congregational Nurse Program Note  Date of Encounter: 10/15/2023  Clinic visit for complains of body aches, chills and feeling weak. BP 122/80, temperature 100.9, pulse 112, respirations 20.  Has coughing and wheezing with yellow sputum.  Notified PCP office, advised to send to ER.  Transportation arranged to go to [facility]. Past Medical History: Past Medical History:  Diagnosis Date   Anxiety    Asthma    COPD (chronic obstructive pulmonary disease) (HCC)    Dyspnea    Endometriosis    Seizure [facility])     Encounter Details:  Community Questionnaire - 10/15/23 1048       Questionnaire   Ask client: Do you give verbal consent for me to treat you today? Yes    Student Assistance N/A    Location Patient Served  [facility]    Encounter Setting CN site    Population Status Unhoused    Insurance Medicaid    Insurance/Financial Assistance Referral N/A    Medication Have Medication Insecurities    Medical Provider Yes    Screening Referrals Made N/A    Medical Referrals Made ED;[facility] PCP/Clinic    Medical Appointment Completed N/A    CNP Interventions Advocate/Support;Counsel;Navigate Healthcare System    Screenings CN Performed Blood Pressure    ED Visit Averted N/A    Life-Saving Intervention Made N/A

## 2023-10-15 NOTE — ED Provider Notes (Signed)
 [place] [facility] AT [facility] Provider Note   CSN: [id number] Arrival date & time: 10/15/23  1256     History Chief Complaint  Patient presents with   Cough    Stacy Bolton is a 53 y.o. female.  Patient past history significant for COPD, cocaine abuse, homelessness presents emergency department today with concerns of a cough.  States that she has been having cough, fever, chills, headache since last night.  States she has been dry multiple dividual's with similar symptoms.  Was at an urgent care last week for a COPD exacerbation and discharged home with a course of steroids and Levaquin.  She states that she has been taking this as prescribed.   Cough      Home Medications Prior to Admission medications   Medication Sig Start Date End Date Taking? Authorizing Provider  acetaminophen (TYLENOL) 500 MG tablet Take 1,000 mg by mouth as needed for mild pain (pain score 1-3) or moderate pain (pain score 4-6).    [provider]  albuterol (PROVENTIL HFA) 108 (90 Base) MCG/ACT inhaler Inhale 2 puffs into the lungs every 6 (six) hours as needed for wheezing or shortness of breath. 08/17/23   Almon Hercules, MD  azithromycin (ZITHROMAX) 500 MG tablet Take as directed to finish course 09/18/23   Rhetta Mura, MD  budesonide-formoterol [facility]) 160-4.5 MCG/ACT inhaler Inhale 2 puffs into the lungs in the morning and at bedtime. 08/17/23   Almon Hercules, MD  guaiFENesin (MUCINEX) 600 MG 12 hr tablet Take 1 tablet (600 mg total) by mouth 2 (two) times daily. 09/17/23   Rhetta Mura, MD  ibuprofen (ADVIL) 400 MG tablet Take 1 tablet (400 mg total) by mouth every 6 (six) hours as needed for mild pain (pain score 1-3) (or Fever >/= 101). 09/17/23   Rhetta Mura, MD  levofloxacin (LEVAQUIN) 500 MG tablet Take 1 tablet (500 mg total) by mouth daily for 5 days. 10/04/23   Garrison, [place] N, FNP  levothyroxine (SYNTHROID) 25 MCG tablet  Take 1 tablet (25 mcg total) by mouth daily before breakfast. 10/03/23   Barrett, Joline Salt, PA-C  ondansetron (ZOFRAN) 4 MG tablet Take 1 tablet (4 mg total) by mouth every 8 (eight) hours as needed for nausea or vomiting. 09/03/23   Dolphus Jenny, PA-C  pantoprazole (PROTONIX) 40 MG tablet Take 1 tablet (40 mg total) by mouth daily. 09/18/23   Rhetta Mura, MD      Allergies    Hydrocodone-acetaminophen, Hydromorphone hcl, and Oxycodone-acetaminophen    Review of Systems   Review of Systems  Respiratory:  Positive for cough.   All other systems reviewed and are negative.   Physical Exam Updated Vital Signs BP 116/76   Pulse (!) 111   Temp 99.9 F (37.7 C)   Resp 18   Ht 5\' 5"  (1.651 m)   Wt 47.6 kg   LMP 10/13/2011   SpO2 93%   BMI 17.47 kg/m  Physical Exam Vitals and nursing note reviewed.  Constitutional:      General: She is not in acute distress.    Appearance: She is well-developed. She is ill-appearing. She is not toxic-appearing.  HENT:     Head: Normocephalic and atraumatic.  Eyes:     Conjunctiva/sclera: Conjunctivae normal.  Cardiovascular:     Rate and Rhythm: Normal rate and regular rhythm.     Heart sounds: No murmur heard. Pulmonary:     Effort: Pulmonary effort is normal. No respiratory  distress.     Breath sounds: Wheezing and rales present.     Comments: Rales primarily noted to the upper lung fields.  Wheezing diffusely. Abdominal:     Palpations: Abdomen is soft.     Tenderness: There is no abdominal tenderness.  Musculoskeletal:        General: No swelling.     Cervical back: Neck supple.  Skin:    General: Skin is warm and dry.     Capillary Refill: Capillary refill takes less than 2 seconds.  Neurological:     Mental Status: She is alert.  Psychiatric:        Mood and Affect: Mood normal.     ED Results / Procedures / Treatments   Labs (all labs ordered are listed, but only abnormal results are displayed) Labs Reviewed  CBC  WITH DIFFERENTIAL/PLATELET - Abnormal; Notable for the following components:      Result Value   WBC 14.3 (*)    MCV 100.2 (*)    Neutro Abs 11.3 (*)    Monocytes Absolute 1.4 (*)    All other components within normal limits  COMPREHENSIVE METABOLIC PANEL - Abnormal; Notable for the following components:   Sodium 134 (*)    Chloride 97 (*)    Calcium 8.7 (*)    All other components within normal limits  I-STAT CHEM 8, ED - Abnormal; Notable for the following components:   Chloride 96 (*)    BUN 5 (*)    Calcium, Ion 1.14 (*)    Hemoglobin 16.0 (*)    HCT 47.0 (*)    All other components within normal limits  RESP PANEL BY RT-PCR (RSV, FLU A&B, COVID)  RVPGX2  CULTURE, BLOOD (ROUTINE X 2)  CULTURE, BLOOD (ROUTINE X 2)  LACTIC ACID, PLASMA  LACTIC ACID, PLASMA    EKG None  Radiology DG Chest Portable 1 View Result Date: 10/15/2023 CLINICAL DATA:  Cough, fever EXAM: PORTABLE CHEST 1 VIEW COMPARISON:  Chest radiograph 10/04/2023 FINDINGS: Stable cardiac and mediastinal contours. Interval development of patchy right upper lung and bilateral lower lung airspace opacities, right-greater-than-left. No pleural effusion or pneumothorax. Osseous structures unremarkable. IMPRESSION: Interval development of patchy right upper lung and bilateral lower lung airspace opacities, right-greater-than-left. Findings are concerning for multifocal pneumonia. Electronically Signed   By: Annia Belt M.D.   On: 10/15/2023 17:20    Procedures Procedures    Medications Ordered in ED Medications  cefTRIAXone (ROCEPHIN) 1 g in sodium chloride 0.9 % 100 mL IVPB (1 g Intravenous New Bag/Given 10/15/23 2104)  azithromycin (ZITHROMAX) 500 mg in sodium chloride 0.9 % 250 mL IVPB (has no administration in time range)  ipratropium-albuterol (DUONEB) 0.5-2.5 (3) MG/3ML nebulizer solution 3 mL (3 mLs Nebulization Given 10/15/23 1332)  sodium chloride 0.9 % bolus 1,000 mL (1,000 mLs Intravenous New Bag/Given  10/15/23 2103)  ketorolac (TORADOL) 15 MG/ML injection 15 mg (15 mg Intravenous Given 10/15/23 2101)  ipratropium-albuterol (DUONEB) 0.5-2.5 (3) MG/3ML nebulizer solution 3 mL (3 mLs Nebulization Given 10/15/23 2127)    ED Course/ Medical Decision Making/ A&P                                 Medical Decision Making Amount and/or Complexity of Data Reviewed Labs: ordered. Radiology: ordered.  Risk Prescription drug management. Decision regarding hospitalization.   This patient presents to the ED for concern of cough.  Differential diagnosis includes COVID-19, pneumonia,  bronchitis, PE, CHF   Lab Tests:  I Ordered, and personally interpreted labs.  The pertinent results include: CBC with leukocytosis of 14.1, CMP with mild evidence of dehydration with hyponatremia hypochloremia, lactic acid normal at 0.9, show panel negative for COVID-19, influenza and RSV   Imaging Studies ordered:  I ordered imaging studies including chest x-ray I independently visualized and interpreted imaging which showed Interval development of patchy right upper lung and bilateral lower lung airspace opacities, right-greater-than-left. Findings are concerning for multifocal pneumonia. I agree with the radiologist interpretation   Medicines ordered and prescription drug management:  I ordered medication including DuoNeb, Toradol, fluids, Rocephin, Zithromax for COPD, low-grade fever, dehydration, pneumonia Reevaluation of the patient after these medicines showed that the patient improved I have reviewed the patients home medicines and have made adjustments as needed   Problem List / ED Course:  Patient past history significant for COPD, cocaine abuse, homelessness presents emergency department today with concerns of a cough.  That she has been having worsening cough, chills, body aches starting last night.  Was seen at an urgent care about 2 weeks ago and diagnosed with a COPD exacerbation and started on  Levaquin.  She states that she took this antibiotic for the 5 days was prescribed but has not had resolution of symptoms and again notably had decline in symptoms last night.  She denies using oxygen at baseline but does feel that she is having increasing need for oxygen feels more out of breath.  Denies any recent leg swelling, hemoptysis, and no prior history of PE or DVT.  No recent surgery immobilization.  Not on blood thinners. On exam, there is notable rales to the upper lung fields with diffuse wheezing all over.  Patient is not ill-appearing but is uncomfortable.  Not acutely meeting sepsis criteria at this time as there is no fever but mild tachycardia on arrival.  Will obtain respiratory swab and chest x-ray for evaluation.  DuoNeb ordered for treatment. On reassessment, patient has mild improvement with the DuoNeb.  Chest x-ray pending. X-ray is concerning for multifocal pneumonia in the bilateral upper and lower lung fields.  In the setting of recent antibiotic use, assuming this is likely outpatient treatment failure.  Will broaden lab workup to include CBC, CMP, lactic acid and blood cultures.  Patient not acutely septic but temperature slightly rising patient remained tachycardic.  No tachypnea noted and no hypotension.  We will initiate treatment with preferred antibiotic regimen for [facility] pneumonia including Rocephin and Zithromax. Labs revealed leukocytosis at 14.3.  Otherwise unremarkable no evidence of AKI renal dysfunction.  Mild dehydration with CMP showing hyponatremia hypochloremia.  Lactic acid normal at 0.9.  Code sepsis not initiated at this time.  Will consult hospitalist for admission in the setting of outpatient failure of pneumonia.  Several episodes of hypoxia on room air.  Order placed for supplemental O2 to keep patient's oxygen saturation level between 92 to 95%. Spoke with Dr. Wynell Balloon, hospitalist, who will be admitting patient.   Social Determinants of  Health:  History of substance abuse and currently homeless  Final Clinical Impression(s) / ED Diagnoses Final diagnoses:  Multifocal pneumonia  Hypoxia    Rx / DC Orders ED Discharge Orders     None         Salomon Mast 10/15/23 2130    Derwood Kaplan, MD 10/19/23 1015

## 2023-10-15 NOTE — ED Notes (Signed)
 Pt placed on nasal cannula 2lpm

## 2023-10-15 NOTE — H&P (Signed)
History and Physical    Stacy Bolton:[id number] DOB: 07-19-1971 DOA: 10/15/2023  PCP: Sharon Seller, NP   Chief Complaint: sob  HPI: Stacy Bolton is a 53 y.o. female with medical history significant of polysubstance abuse, homelessness, COPD who presents emergency department due to persistent cough.  Patient endorsing fever and chills as well as cough for the last 24 hours.  She went to an urgent care and was diagnosed with a COPD exacerbation.  She was started on steroids and Levaquin.  She states she was taking her medication as prescribed however developed persistent shortness of breath and cough.  She presented to the ER where she was found to be hypoxic requiring 2 L of oxygen.  Labs were obtained which showed lactic acid 0.9, WBC 14.3, hemoglobin 14.1, sodium 134.  Patient had chest x-ray which showed development of right upper lobe and bilateral airspace opacities consistent with multifocal pneumonia.  Patient was started on ceftriaxone azithromycin and further workup.   Review of Systems: Review of Systems  Constitutional:  Negative for chills and fever.  HENT: Negative.    Eyes: Negative.   Respiratory:  Positive for cough and shortness of breath.   Cardiovascular: Negative.   Gastrointestinal: Negative.   Genitourinary: Negative.   Musculoskeletal: Negative.   Skin: Negative.   Neurological: Negative.   Endo/Heme/Allergies: Negative.   Psychiatric/Behavioral: Negative.    All other systems reviewed and are negative.    As per HPI otherwise 10 point review of systems negative.   Allergies  Allergen Reactions   Hydrocodone-Acetaminophen Itching   Hydromorphone Hcl Itching   Oxycodone-Acetaminophen Itching    Past Medical History:  Diagnosis Date   Anxiety    Asthma    COPD (chronic obstructive pulmonary disease) (HCC)    Dyspnea    Endometriosis    Seizure (HCC)     Past Surgical History:  Procedure Laterality Date   CHOLECYSTECTOMY        reports that she has been smoking cigarettes. She has a 45 pack-year smoking history. She does not have any smokeless tobacco history on file. She reports current alcohol use. She reports current drug use. Drugs: Cocaine and Marijuana.  History reviewed. No pertinent family history.  Prior to Admission medications   Medication Sig Start Date End Date Taking? Authorizing Provider  acetaminophen (TYLENOL) 500 MG tablet Take 1,000 mg by mouth as needed for mild pain (pain score 1-3) or moderate pain (pain score 4-6).    [provider]  albuterol (PROVENTIL HFA) 108 (90 Base) MCG/ACT inhaler Inhale 2 puffs into the lungs every 6 (six) hours as needed for wheezing or shortness of breath. 08/17/23   Almon Hercules, MD  azithromycin (ZITHROMAX) 500 MG tablet Take as directed to finish course 09/18/23   Rhetta Mura, MD  budesonide-formoterol [facility]) 160-4.5 MCG/ACT inhaler Inhale 2 puffs into the lungs in the morning and at bedtime. 08/17/23   Almon Hercules, MD  guaiFENesin (MUCINEX) 600 MG 12 hr tablet Take 1 tablet (600 mg total) by mouth 2 (two) times daily. 09/17/23   Rhetta Mura, MD  ibuprofen (ADVIL) 400 MG tablet Take 1 tablet (400 mg total) by mouth every 6 (six) hours as needed for mild pain (pain score 1-3) (or Fever >/= 101). 09/17/23   Rhetta Mura, MD  levofloxacin (LEVAQUIN) 500 MG tablet Take 1 tablet (500 mg total) by mouth daily for 5 days. 10/04/23   Garrison, [place] N, FNP  levothyroxine (SYNTHROID) 25 MCG tablet  Take 1 tablet (25 mcg total) by mouth daily before breakfast. 10/03/23   Barrett, Joline Salt, PA-C  ondansetron (ZOFRAN) 4 MG tablet Take 1 tablet (4 mg total) by mouth every 8 (eight) hours as needed for nausea or vomiting. 09/03/23   Dolphus Jenny, PA-C  pantoprazole (PROTONIX) 40 MG tablet Take 1 tablet (40 mg total) by mouth daily. 09/18/23   Rhetta Mura, MD    Physical Exam: Vitals:   10/15/23 2045 10/15/23 2105 10/15/23 2130  10/15/23 2200  BP: 116/76  (!) 115/106 104/69  Pulse: (!) 105 (!) 111 (!) 114 (!) 110  Resp:    18  Temp:      TempSrc:      SpO2: (!) 89% 93% 98% 94%  Weight:      Height:       Physical Exam Constitutional:      Appearance: She is normal weight.  HENT:     Nose: Nose normal.     Mouth/Throat:     Mouth: Mucous membranes are moist.     Pharynx: Oropharynx is clear.  Eyes:     Pupils: Pupils are equal, round, and reactive to light.  Cardiovascular:     Rate and Rhythm: Normal rate and regular rhythm.     Pulses: Normal pulses.     Heart sounds: Normal heart sounds.  Pulmonary:     Effort: Respiratory distress present.     Breath sounds: Rhonchi present.  Abdominal:     General: Abdomen is flat. Bowel sounds are normal.  Musculoskeletal:        General: Normal range of motion.     Cervical back: Normal range of motion.  Skin:    General: Skin is warm.     Capillary Refill: Capillary refill takes less than 2 seconds.  Neurological:     General: No focal deficit present.     Mental Status: She is alert.  Psychiatric:        Mood and Affect: Mood normal.        Labs on Admission: I have personally reviewed the patients's labs and imaging studies.  Assessment/Plan Principal Problem:   Community acquired bacterial pneumonia   # Acute hypoxic resp failure most likely secondary to bacterial community-acquired pneumonia, POA, active - Patient endorsing hypoxia shortness of breath - Satting high 80s - Recently treated with Levaquin  Plan: Continue ceftriaxone and azithromycin  # History of COPD-placed on nebulizers with DuoNebs, Pulmicort and Brovana  # Hypothyroidism-continue levothyroxine  # GERD-continue Protonix  Admission status: Inpatient Med-Surg  Certification: The appropriate patient status for this patient is INPATIENT. Inpatient status is judged to be reasonable and necessary in order to provide the required intensity of service to ensure the  patient's safety. The patient's presenting symptoms, physical exam findings, and initial radiographic and laboratory data in the context of their chronic comorbidities is felt to place them at high risk for further clinical deterioration. Furthermore, it is not anticipated that the patient will be medically stable for discharge from the hospital within 2 midnights of admission.   * I certify that at the point of admission it is my clinical judgment that the patient will require inpatient hospital care spanning beyond 2 midnights from the point of admission due to high intensity of service, high risk for further deterioration and high frequency of surveillance required.Alan Mulder MD [facility] If 7PM-7AM, please contact night-coverage [email or web address]  10/15/2023, 10:07 PM

## 2023-10-15 NOTE — Telephone Encounter (Signed)
Copied from CRM [id number]. Topic: Clinical - Red Word Triage >> Oct 15, 2023 11:08 AM Maxwell Marion wrote: [facility] that prompted transfer to Nurse Triage: fever of 101.1, wheezing, heart rate of 112,history of COPD and respiratory failure  Chief Complaint: fever, wheezing, elevated hr Symptoms: see above Frequency: constant Pertinent Negatives: Patient denies NA; patient lives in shelter; shelter nurse called Disposition: [x] ED /[] Urgent Care (no appt availability in office) / [] Appointment(In office/virtual)/ []  [facility]/ [] Home Care/ [] Refused Recommended Disposition /[] [facility]/ []  Follow-up with PCP Additional Notes: Per protocol, patient sent to ER.  PCP office updated;  Reason for Disposition  Difficulty breathing  Answer Assessment - Initial Assessment Questions 1. TEMPERATURE: "What is the most recent temperature?"  "How was it measured?"      101.1 2. ONSET: "When did the fever start?"      Unknown patient lives in a shelter 3. CHILLS: "Do you have chills?" If yes: "How bad are they?"  (e.g., none, mild, moderate, severe)   - NONE: no chills   - MILD: feeling cold   - MODERATE: feeling very cold, some shivering (feels better under a thick blanket)   - SEVERE: feeling extremely cold with shaking chills (general body shaking, rigors; even under a thick blanket)      Fever, chill, elevated hr 91% on RA 4. OTHER SYMPTOMS: "Do you have any other symptoms besides the fever?"  (e.g., abdomen pain, cough, diarrhea, earache, headache, sore throat, urination pain)     Cough,  5. CAUSE: If there are no symptoms, ask: "What do you think is causing the fever?"      unknonwn 6. CONTACTS: "Does anyone else in the family have an infection?"     Lives in a shelter 7. TREATMENT: "What have you done so far to treat this fever?" (e.g., medications)     no 8. IMMUNOCOMPROMISE: "Do you have of the following: diabetes, HIV positive, splenectomy, cancer chemotherapy, chronic  steroid treatment, transplant patient, etc."     Hx copd, resp failure 9. PREGNANCY: "Is there any chance you are pregnant?" "When was your last menstrual period?"     na 10. TRAVEL: "Have you traveled out of the country in the last month?" (e.g., travel history, exposures)       na  Protocols used: [facility]

## 2023-10-16 DIAGNOSIS — J159 Unspecified bacterial pneumonia: Secondary | ICD-10-CM | POA: Diagnosis not present

## 2023-10-16 LAB — CBC
HCT: 39.3 % (ref 36.0–46.0)
Hemoglobin: 12.2 g/dL (ref 12.0–15.0)
MCH: 31.8 pg (ref 26.0–34.0)
MCHC: 31 g/dL (ref 30.0–36.0)
MCV: 102.3 fL — ABNORMAL HIGH (ref 80.0–100.0)
Platelets: 272 10*3/uL (ref 150–400)
RBC: 3.84 MIL/uL — ABNORMAL LOW (ref 3.87–5.11)
RDW: 14.7 % (ref 11.5–15.5)
WBC: 10.4 10*3/uL (ref 4.0–10.5)
nRBC: 0 % (ref 0.0–0.2)

## 2023-10-16 LAB — LACTIC ACID, PLASMA: Lactic Acid, Venous: 1.1 mmol/L (ref 0.5–1.9)

## 2023-10-16 MED ORDER — HYDROXYZINE HCL 10 MG PO TABS
10.0000 mg | ORAL_TABLET | Freq: Three times a day (TID) | ORAL | Status: DC | PRN
  Administered 2023-10-16 – 2023-10-18 (×5): 10 mg via ORAL
  Filled 2023-10-16 (×6): qty 1

## 2023-10-16 MED ORDER — ENSURE ENLIVE PO LIQD
237.0000 mL | Freq: Two times a day (BID) | ORAL | Status: DC
  Administered 2023-10-17 – 2023-10-21 (×10): 237 mL via ORAL

## 2023-10-16 MED ORDER — GUAIFENESIN ER 600 MG PO TB12
600.0000 mg | ORAL_TABLET | Freq: Two times a day (BID) | ORAL | Status: DC
  Administered 2023-10-16 – 2023-10-21 (×11): 600 mg via ORAL
  Filled 2023-10-16 (×11): qty 1

## 2023-10-16 MED ORDER — TRAMADOL HCL 50 MG PO TABS
50.0000 mg | ORAL_TABLET | Freq: Once | ORAL | Status: AC
  Administered 2023-10-16: 50 mg via ORAL
  Filled 2023-10-16: qty 1

## 2023-10-16 MED ORDER — NICOTINE 7 MG/24HR TD PT24
7.0000 mg | MEDICATED_PATCH | Freq: Once | TRANSDERMAL | Status: AC
  Administered 2023-10-16: 7 mg via TRANSDERMAL
  Filled 2023-10-16 (×2): qty 1

## 2023-10-16 MED ORDER — NICOTINE 21 MG/24HR TD PT24
21.0000 mg | MEDICATED_PATCH | Freq: Once | TRANSDERMAL | Status: AC
  Administered 2023-10-16: 21 mg via TRANSDERMAL
  Filled 2023-10-16: qty 1

## 2023-10-16 MED ORDER — LIDOCAINE 5 % EX PTCH
1.0000 | MEDICATED_PATCH | Freq: Once | CUTANEOUS | Status: AC
  Administered 2023-10-16: 1 via TRANSDERMAL
  Filled 2023-10-16: qty 1

## 2023-10-16 MED ORDER — PREDNISONE 20 MG PO TABS
20.0000 mg | ORAL_TABLET | Freq: Every day | ORAL | Status: DC
  Administered 2023-10-16 – 2023-10-17 (×2): 20 mg via ORAL
  Filled 2023-10-16 (×2): qty 1

## 2023-10-16 NOTE — Hospital Course (Addendum)
 53 y.o. female with medical history significant of polysubstance abuse, homelessness, COPD who presented to ED due to persistent cough fever chills and headache. She went to an urgent care and was diagnosed with a COPD exacerbation and was given steroids and Levaquin which she has been taking without improvement. In ED: Mild tachycardia noted hypoxic 86% sp place on 2 L [place].  Labs showed leukocytosis 14.3 normal lactic acid, influenza RSV COVID-negative. Cxr>right upper lobe and bilateral airspace opacities consistent with multifocal pneumonia.  Blood culture obtained, and admitted for further management lactic acid has remained stable, leukocytosis resolved.  Patient is slow to improve her ongoing wheezing, managed with systemic steroid empiric antibiotics supplemental oxygen.  Able to wean off to room air at rest on 2/24, we did ambulatory oxygen testing and i no longer needing oxygen. She will be discharged to shelter.

## 2023-10-16 NOTE — Plan of Care (Signed)
  Problem: Clinical Measurements: Goal: Diagnostic test results will improve Outcome: Progressing Goal: Respiratory complications will improve Outcome: Progressing Goal: Cardiovascular complication will be avoided Outcome: Progressing   

## 2023-10-16 NOTE — Progress Notes (Signed)
PROGRESS NOTE Stacy Bolton  ZDG:[id number] DOB: Jan 15, 1971 DOA: 10/15/2023 PCP: No primary care provider on file.  Brief Narrative/Hospital Course: 53 y.o. female with medical history significant of polysubstance abuse, homelessness, COPD who presented to ED due to persistent cough fever chills and headache. She went to an urgent care and was diagnosed with a COPD exacerbation and was given steroids and Levaquin which she has been taking without improvement. In ED: Mild tachycardia noted hypoxic 86% sp place on 2 L [place].  Labs showed leukocytosis 14.3 normal lactic acid, influenza RSV COVID-negative. Cxr>right upper lobe and bilateral airspace opacities consistent with multifocal pneumonia.  Blood culture obtained, and admitted for further management     Subjective: Patient seen and examined this morning Endorses some anxiety, requested nicotine patch last night Overnight afebrile, mild tachycardia and tachypnea in low 20s  Assessment and Plan: Principal Problem:   Community acquired bacterial pneumonia   Acute hypoxic respiratory failure due to pneumonia Multifocal pneumonia: Patient presented with respiratory symptoms, labs remarkable for leukocytosis,cxr personally reviewed and shows pneumonia. Follow-up blood culture and sputum culture. Continue ceftriaxone azithromycin, bronchodilators, smoking cessation encouraged.   Cont Pulmonary toileting-nebs/IS/increase activity.   Recent Labs  Lab 10/15/23 1852 10/15/23 2017 10/16/23 0127  WBC  --  14.3*  --   LATICACIDVEN 0.9  --  1.1   COPD: Continue Pulmicort, bronchodilators [place]  Hypothyroidism: Continue levothyroxine  GERD: Continue PPI  Anxiety: Added atarax prn  Patient is requesting to go outside for fresh air once a day  DVT prophylaxis: enoxaparin (LOVENOX) injection 40 mg Start: 10/16/23 1000 SCDs Start: 10/15/23 2155 Code Status:   Code Status: Full Code Family Communication: plan of care discussed with  patient at bedside. Patient status is: Remains hospitalized because of severity of illness Level of care: Med-Surg   Dispo: The patient is from: Home            Anticipated disposition: Anticipate discharge tomorrow  Objective: Vitals last 24 hrs: Vitals:   10/15/23 2230 10/16/23 0036 10/16/23 0437 10/16/23 1019  BP: 102/66 104/69 116/83   Pulse: (!) 110 (!) 106 98   Resp: 18 20 (!) 22   Temp:  98.7 F (37.1 C) 100 F (37.8 C)   TempSrc:  Oral Oral   SpO2: 95% 96% 96% 97%  Weight:      Height:       Weight change:   Physical Examination: General exam: alert awake,at baseline, older than stated age HEENT:Oral mucosa moist, Ear/Nose WNL grossly Respiratory system: Bilaterally expiratory wheezing,no use of accessory muscle Cardiovascular system: S1 & S2 +, No JVD. Gastrointestinal system: Abdomen soft,NT,ND, BS+ Nervous System: Alert, awake, moving all extremities,and following commands. Extremities: LE edema neg,distal peripheral pulses palpable and warm.  Skin: No rashes,no icterus. MSK: Normal muscle bulk,tone, power   Medications reviewed:  Scheduled Meds:  arformoterol  15 mcg Nebulization BID   azithromycin  500 mg Oral Daily   budesonide (PULMICORT) nebulizer solution  0.25 mg Nebulization BID   enoxaparin (LOVENOX) injection  40 mg Subcutaneous Q24H   guaiFENesin  600 mg Oral BID   ipratropium-albuterol  3 mL Nebulization QID   levothyroxine  25 mcg Oral Q0600   lidocaine  1 patch Transdermal Once   nicotine  7 mg Transdermal Once   pantoprazole  40 mg Oral Daily   Continuous Infusions:  cefTRIAXone (ROCEPHIN)  IV 2 g (10/16/23 0936)    Diet Order  Diet regular Room service appropriate? Yes; Fluid consistency: Thin  Diet effective now                  Intake/Output Summary (Last 24 hours) at 10/16/2023 1028 Last data filed at 10/16/2023 0500 Gross per 24 hour  Intake 1590.68 ml  Output --  Net 1590.68 ml   Net IO Since Admission:  1,590.68 mL [10/16/23 1028]  Wt Readings from Last 3 Encounters:  10/15/23 47.6 kg  10/08/23 46.7 kg  10/02/23 46.7 kg     Unresulted Labs (From admission, onward)    None     Data Reviewed: I have personally reviewed following labs and imaging studies CBC: Recent Labs  Lab 10/15/23 2017 10/15/23 2025  WBC 14.3*  --   NEUTROABS 11.3*  --   HGB 14.1 16.0*  HCT 43.8 47.0*  MCV 100.2*  --   PLT 294  --    Basic Metabolic Panel:  Recent Labs  Lab 10/15/23 2017 10/15/23 2025  NA 134* 135  K 3.5 3.6  CL 97* 96*  CO2 27  --   GLUCOSE 98 92  BUN 8 5*  CREATININE 0.47 0.60  CALCIUM 8.7*  --    GFR: Estimated Creatinine Clearance: 60.4 mL/min (by C-G formula based on SCr of 0.6 mg/dL). Liver Function Tests:  Recent Labs  Lab 10/15/23 2017  AST 21  ALT 18  ALKPHOS 76  BILITOT 0.7  PROT 7.0  ALBUMIN 3.9  Sepsis Labs: Recent Labs  Lab 10/15/23 1852 10/16/23 0127  LATICACIDVEN 0.9 1.1   Recent Results (from the past 240 hours)  Resp panel by RT-PCR (RSV, Flu A&B, Covid) Anterior Nasal Swab     Status: None   Collection Time: 10/15/23  1:08 PM   Specimen: Anterior Nasal Swab  Result Value Ref Range Status   SARS Coronavirus 2 by RT PCR NEGATIVE NEGATIVE Final    Comment: (NOTE) SARS-CoV-2 target nucleic acids are NOT DETECTED.  The SARS-CoV-2 RNA is generally detectable in upper respiratory specimens during the acute phase of infection. The lowest concentration of SARS-CoV-2 viral copies this assay can detect is 138 copies/mL. A negative result does not preclude SARS-Cov-2 infection and should not be used as the sole basis for treatment or other patient management decisions. A negative result may occur with  improper specimen collection/handling, submission of specimen other than nasopharyngeal swab, presence of viral mutation(s) within the areas targeted by this assay, and inadequate number of viral copies(<138 copies/mL). A negative result must be combined  with clinical observations, patient history, and epidemiological information. The expected result is Negative.  Fact Sheet for Patients:  BloggerCourse.com  Fact Sheet for Healthcare Providers:  SeriousBroker.it  This test is no t yet approved or cleared by the [place] FDA and  has been authorized for detection and/or diagnosis of SARS-CoV-2 by FDA under an Emergency Use Authorization (EUA). This EUA will remain  in effect (meaning this test can be used) for the duration of the COVID-19 declaration under Section 564(b)(1) of the Act, 21 U.S.C.section 360bbb-3(b)(1), unless the authorization is terminated  or revoked sooner.       Influenza A by PCR NEGATIVE NEGATIVE Final   Influenza B by PCR NEGATIVE NEGATIVE Final    Comment: (NOTE) The Xpert Xpress SARS-CoV-2/FLU/RSV plus assay is intended as an aid in the diagnosis of influenza from Nasopharyngeal swab specimens and should not be used as a sole basis for treatment. Nasal washings and aspirates are unacceptable for  Xpert Xpress SARS-CoV-2/FLU/RSV testing.  Fact Sheet for Patients: BloggerCourse.com  Fact Sheet for Healthcare Providers: SeriousBroker.it  This test is not yet approved or cleared by the [place] FDA and has been authorized for detection and/or diagnosis of SARS-CoV-2 by FDA under an Emergency Use Authorization (EUA). This EUA will remain in effect (meaning this test can be used) for the duration of the COVID-19 declaration under Section 564(b)(1) of the Act, 21 U.S.C. section 360bbb-3(b)(1), unless the authorization is terminated or revoked.     Resp Syncytial Virus by PCR NEGATIVE NEGATIVE Final    Comment: (NOTE) Fact Sheet for Patients: BloggerCourse.com  Fact Sheet for Healthcare Providers: SeriousBroker.it  This test is not yet approved  or cleared by the [place] FDA and has been authorized for detection and/or diagnosis of SARS-CoV-2 by FDA under an Emergency Use Authorization (EUA). This EUA will remain in effect (meaning this test can be used) for the duration of the COVID-19 declaration under Section 564(b)(1) of the Act, 21 U.S.C. section 360bbb-3(b)(1), unless the authorization is terminated or revoked.  Performed at [facility], [address] [place], [place] [address]   Blood culture (routine x 2)     Status: None (Preliminary result)   Collection Time: 10/15/23  7:50 PM   Specimen: BLOOD RIGHT FOREARM  Result Value Ref Range Status   Specimen Description   Final    BLOOD RIGHT FOREARM Performed at [facility], [address] [place], [place] [address]    Special Requests   Final    BOTTLES DRAWN AEROBIC AND ANAEROBIC Blood Culture results may not be optimal due to an inadequate volume of blood received in culture bottles Performed at [facility], [address], [place], [place] [address]    Culture   Final    NO GROWTH < 12 HOURS Performed at [facility], [address] [place], [place] [address]    Report Status PENDING  Incomplete  Blood culture (routine x 2)     Status: None (Preliminary result)   Collection Time: 10/15/23  8:30 PM   Specimen: BLOOD LEFT FOREARM  Result Value Ref Range Status   Specimen Description   Final    BLOOD LEFT FOREARM Performed at [facility], [address], [place], [place] [address]    Special Requests   Final    BOTTLES DRAWN AEROBIC AND ANAEROBIC Blood Culture results may not be optimal due to an inadequate volume of blood received in culture bottles Performed at [facility], [address] [place], [place] [address]    Culture   Final    NO GROWTH < 12 HOURS Performed at [facility], [address] [place], [place] [address]    Report  Status PENDING  Incomplete  Antimicrobials/Microbiology: Anti-infectives (From admission, onward)    Start     Dose/Rate Route Frequency Ordered Stop   10/16/23 1000  cefTRIAXone (ROCEPHIN) 2 g in sodium chloride 0.9 % 100 mL IVPB        2 g 200 mL/hr over 30 Minutes Intravenous Every 24 hours 10/15/23 2221     10/16/23 1000  azithromycin (ZITHROMAX) tablet 500 mg        500 mg Oral Daily 10/15/23 2221     10/15/23 1900  cefTRIAXone (ROCEPHIN) 1 g in sodium chloride 0.9 % 100 mL IVPB        1 g 200 mL/hr over 30 Minutes Intravenous  Once 10/15/23 1853 10/15/23 2134   10/15/23 1900  azithromycin (ZITHROMAX) 500 mg in sodium chloride 0.9 % 250 mL IVPB        500 mg 250 mL/hr over 60 Minutes Intravenous  Once 10/15/23 1853 10/15/23 2359   10/15/23 1830  amoxicillin-clavulanate (AUGMENTIN) 875-125 MG per tablet 1 tablet  Status:  Discontinued        1 tablet Oral  Once 10/15/23 1823 10/15/23 1853   10/15/23 1830  azithromycin (ZITHROMAX) tablet 500 mg  Status:  Discontinued        500 mg Oral  Once 10/15/23 1823 10/15/23 1853         Component Value Date/Time   SDES  10/15/2023 2030    BLOOD LEFT FOREARM Performed at [facility], [address], [place], [place] [address]    SPECREQUEST  10/15/2023 2030    BOTTLES DRAWN AEROBIC AND ANAEROBIC Blood Culture results may not be optimal due to an inadequate volume of blood received in culture bottles Performed at [facility], [address], [place], [place] [address]    CULT  10/15/2023 2030    NO GROWTH < 12 HOURS Performed at [facility], [address] [place], [place] [address]    REPTSTATUS PENDING 10/15/2023 2030     Radiology Studies: DG Chest Portable 1 View Result Date: 10/15/2023 CLINICAL DATA:  Cough, fever EXAM: PORTABLE CHEST 1 VIEW COMPARISON:  Chest radiograph 10/04/2023 FINDINGS: Stable cardiac and mediastinal contours. Interval development of patchy right upper lung  and bilateral lower lung airspace opacities, right-greater-than-left. No pleural effusion or pneumothorax. Osseous structures unremarkable. IMPRESSION: Interval development of patchy right upper lung and bilateral lower lung airspace opacities, right-greater-than-left. Findings are concerning for multifocal pneumonia. Electronically Signed   By: Annia Belt M.D.   On: 10/15/2023 17:20    LOS: 1 day   Total time spent in review of labs and imaging, patient evaluation, formulation of plan, documentation and communication with family: 35 minutes  Lanae Boast, MD  [facility]  10/16/2023, 10:28 AM

## 2023-10-16 NOTE — Plan of Care (Signed)
   Problem: Education: Goal: Knowledge of General Education information will improve Description Including pain rating scale, medication(s)/side effects and non-pharmacologic comfort measures Outcome: Progressing   Problem: Health Behavior/Discharge Planning: Goal: Ability to manage health-related needs will improve Outcome: Progressing

## 2023-10-16 NOTE — Progress Notes (Signed)
Patient requested a nicotine patch. She states she smokes less than a PPD. Notified J. Garner Nash, NP. Will continue to monitor.

## 2023-10-17 DIAGNOSIS — J159 Unspecified bacterial pneumonia: Secondary | ICD-10-CM | POA: Diagnosis not present

## 2023-10-17 MED ORDER — IPRATROPIUM-ALBUTEROL 0.5-2.5 (3) MG/3ML IN SOLN
3.0000 mL | Freq: Four times a day (QID) | RESPIRATORY_TRACT | Status: DC
  Administered 2023-10-17 – 2023-10-19 (×8): 3 mL via RESPIRATORY_TRACT
  Filled 2023-10-17 (×8): qty 3

## 2023-10-17 MED ORDER — METHYLPREDNISOLONE SODIUM SUCC 40 MG IJ SOLR
40.0000 mg | Freq: Two times a day (BID) | INTRAMUSCULAR | Status: DC
  Administered 2023-10-17 – 2023-10-21 (×8): 40 mg via INTRAVENOUS
  Filled 2023-10-17 (×9): qty 1

## 2023-10-17 MED ORDER — NICOTINE 21 MG/24HR TD PT24
21.0000 mg | MEDICATED_PATCH | Freq: Every day | TRANSDERMAL | Status: DC
  Administered 2023-10-17 – 2023-10-21 (×5): 21 mg via TRANSDERMAL
  Filled 2023-10-17 (×5): qty 1
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
## 2023-10-17 NOTE — Plan of Care (Signed)
   Problem: Education: Goal: Knowledge of General Education information will improve Description Including pain rating scale, medication(s)/side effects and non-pharmacologic comfort measures Outcome: Progressing

## 2023-10-17 NOTE — Plan of Care (Signed)
  Problem: Clinical Measurements: Goal: Diagnostic test results will improve Outcome: Progressing Goal: Respiratory complications will improve Outcome: Progressing Goal: Cardiovascular complication will be avoided Outcome: Progressing   

## 2023-10-17 NOTE — Progress Notes (Signed)
PROGRESS NOTE TALER KUSHNER  MVH:[id number] DOB: [birth year] DOA: 10/15/2023 PCP: No primary care provider on file.  Brief Narrative/Hospital Course: 53 y.o. female with medical history significant of polysubstance abuse, homelessness, COPD who presented to ED due to persistent cough fever chills and headache. She went to an urgent care and was diagnosed with a COPD exacerbation and was given steroids and Levaquin which she has been taking without improvement. In ED: Mild tachycardia noted hypoxic 86% sp place on 2 L [place].  Labs showed leukocytosis 14.3 normal lactic acid, influenza RSV COVID-negative. Cxr>right upper lobe and bilateral airspace opacities consistent with multifocal pneumonia.  Blood culture obtained, and admitted for further management lactic acid has remained stable, leukocytosis resolved.     Subjective: Patient seen examined this morning she reports ongoing wheezing cough  She does not feel ready for discharge  She reports she was hypoxic - off o2 now. But on [place] at 2l spo2 94-98% Complains of ongoing wheezing Assessment and Plan: Principal Problem:   Community acquired bacterial pneumonia   Acute hypoxic respiratory failure due to pneumonia Multifocal pneumonia: Patient presented with respiratory symptoms, labs remarkable for leukocytosis,cxr personally reviewed and shows pneumonia. Blood culture NGTD.  Leukocytosis resolved.  Continue ceftriaxone azithromycin, bronchodilators, smoking cessation encouraged.   Cont Pulmonary toileting-nebs/IS/increase activity.   Recent Labs  Lab 10/15/23 1852 10/15/23 2017 10/16/23 0127 10/16/23 1317  WBC  --  14.3*  --  10.4  LATICACIDVEN 0.9  --  1.1  --    COPD w/ acute exacerbation: Complains of ongoing wheezing, will change p.o. to IV Solu-Medrol continue Pulmicort, bronchodilators and supplemental oxygen wean oxygen to keep spo2 > 89%  Hypothyroidism: Continue levothyroxine  GERD: Continue PPI  Anxiety: Cont  atarax prn  Homelessness: TOC consulted to provide resources and to help with her social issues  DVT prophylaxis: enoxaparin (LOVENOX) injection 40 mg Start: 10/16/23 1000 SCDs Start: 10/15/23 2155 Code Status:   Code Status: Full Code Family Communication: plan of care discussed with patient at bedside. Patient status is: Remains hospitalized because of severity of illness Level of care: Med-Surg   Dispo: The patient is from: Home            Anticipated disposition: Anticipate discharge 1-2 days Objective: Vitals last 24 hrs: Vitals:   10/16/23 2001 10/17/23 0122 10/17/23 0549 10/17/23 1021  BP:  113/60 114/76   Pulse:  82 87   Resp:  20 20   Temp:  98.4 F (36.9 C) 98.5 F (36.9 C)   TempSrc:  Oral Oral   SpO2: 90% 94% 98% 96%  Weight:      Height:       Weight change:   Physical Examination: General exam: alert awake, anxious oriented at baseline, older than stated age HEENT:Oral mucosa moist, Ear/Nose WNL grossly Respiratory system: Bilaterally air entry present with wheezing-some conducted wheezing from the throat but no use of accessory muscles Cardiovascular system: S1 & S2 +, No JVD. Gastrointestinal system: Abdomen soft,NT,ND, BS+ Nervous System: Alert, awake, moving all extremities,and following commands. Extremities: LE edema neg,distal peripheral pulses palpable and warm.  Skin: No rashes,no icterus. MSK: Normal muscle bulk,tone, power   Medications reviewed:  Scheduled Meds:  arformoterol  15 mcg Nebulization BID   azithromycin  500 mg Oral Daily   budesonide (PULMICORT) nebulizer solution  0.25 mg Nebulization BID   enoxaparin (LOVENOX) injection  40 mg Subcutaneous Q24H   feeding supplement  237 mL Oral BID BM   guaiFENesin  600  mg Oral BID   ipratropium-albuterol  3 mL Nebulization Q6H WA   levothyroxine  25 mcg Oral Q0600   methylPREDNISolone (SOLU-MEDROL) injection  40 mg Intravenous BID   nicotine  21 mg Transdermal Once   pantoprazole  40 mg  Oral Daily   Continuous Infusions:  cefTRIAXone (ROCEPHIN)  IV 2 g (10/17/23 0935)    Diet Order             Diet regular Room service appropriate? Yes; Fluid consistency: Thin  Diet effective now                  Intake/Output Summary (Last 24 hours) at 10/17/2023 1038 Last data filed at 10/17/2023 0543 Gross per 24 hour  Intake 720 ml  Output --  Net 720 ml   Net IO Since Admission: 2,310.68 mL [10/17/23 1038]  Wt Readings from Last 3 Encounters:  10/15/23 47.6 kg  10/08/23 46.7 kg  10/02/23 46.7 kg     Unresulted Labs (From admission, onward)    None     Data Reviewed: I have personally reviewed following labs and imaging studies CBC: Recent Labs  Lab 10/15/23 2017 10/15/23 2025 10/16/23 1317  WBC 14.3*  --  10.4  NEUTROABS 11.3*  --   --   HGB 14.1 16.0* 12.2  HCT 43.8 47.0* 39.3  MCV 100.2*  --  102.3*  PLT 294  --  272   Basic Metabolic Panel:  Recent Labs  Lab 10/15/23 2017 10/15/23 2025  NA 134* 135  K 3.5 3.6  CL 97* 96*  CO2 27  --   GLUCOSE 98 92  BUN 8 5*  CREATININE 0.47 0.60  CALCIUM 8.7*  --    GFR: Estimated Creatinine Clearance: 60.4 mL/min (by C-G formula based on SCr of 0.6 mg/dL). Liver Function Tests:  Recent Labs  Lab 10/15/23 2017  AST 21  ALT 18  ALKPHOS 76  BILITOT 0.7  PROT 7.0  ALBUMIN 3.9  Sepsis Labs: Recent Labs  Lab 10/15/23 1852 10/16/23 0127  LATICACIDVEN 0.9 1.1   Recent Results (from the past 240 hours)  Resp panel by RT-PCR (RSV, Flu A&B, Covid) Anterior Nasal Swab     Status: None   Collection Time: 10/15/23  1:08 PM   Specimen: Anterior Nasal Swab  Result Value Ref Range Status   SARS Coronavirus 2 by RT PCR NEGATIVE NEGATIVE Final    Comment: (NOTE) SARS-CoV-2 target nucleic acids are NOT DETECTED.  The SARS-CoV-2 RNA is generally detectable in upper respiratory specimens during the acute phase of infection. The lowest concentration of SARS-CoV-2 viral copies this assay can detect is 138  copies/mL. A negative result does not preclude SARS-Cov-2 infection and should not be used as the sole basis for treatment or other patient management decisions. A negative result may occur with  improper specimen collection/handling, submission of specimen other than nasopharyngeal swab, presence of viral mutation(s) within the areas targeted by this assay, and inadequate number of viral copies(<138 copies/mL). A negative result must be combined with clinical observations, patient history, and epidemiological information. The expected result is Negative.  Fact Sheet for Patients:  BloggerCourse.com  Fact Sheet for Healthcare Providers:  SeriousBroker.it  This test is no t yet approved or cleared by the [place] FDA and  has been authorized for detection and/or diagnosis of SARS-CoV-2 by FDA under an Emergency Use Authorization (EUA). This EUA will remain  in effect (meaning this test can be used) for the duration of  the COVID-19 declaration under Section 564(b)(1) of the Act, 21 U.S.C.section 360bbb-3(b)(1), unless the authorization is terminated  or revoked sooner.       Influenza A by PCR NEGATIVE NEGATIVE Final   Influenza B by PCR NEGATIVE NEGATIVE Final    Comment: (NOTE) The Xpert Xpress SARS-CoV-2/FLU/RSV plus assay is intended as an aid in the diagnosis of influenza from Nasopharyngeal swab specimens and should not be used as a sole basis for treatment. Nasal washings and aspirates are unacceptable for Xpert Xpress SARS-CoV-2/FLU/RSV testing.  Fact Sheet for Patients: BloggerCourse.com  Fact Sheet for Healthcare Providers: SeriousBroker.it  This test is not yet approved or cleared by the [place] FDA and has been authorized for detection and/or diagnosis of SARS-CoV-2 by FDA under an Emergency Use Authorization (EUA). This EUA will remain in effect (meaning  this test can be used) for the duration of the COVID-19 declaration under Section 564(b)(1) of the Act, 21 U.S.C. section 360bbb-3(b)(1), unless the authorization is terminated or revoked.     Resp Syncytial Virus by PCR NEGATIVE NEGATIVE Final    Comment: (NOTE) Fact Sheet for Patients: BloggerCourse.com  Fact Sheet for Healthcare Providers: SeriousBroker.it  This test is not yet approved or cleared by the [place] FDA and has been authorized for detection and/or diagnosis of SARS-CoV-2 by FDA under an Emergency Use Authorization (EUA). This EUA will remain in effect (meaning this test can be used) for the duration of the COVID-19 declaration under Section 564(b)(1) of the Act, 21 U.S.C. section 360bbb-3(b)(1), unless the authorization is terminated or revoked.  Performed at [facility], [address] [place], [place] [address]   Blood culture (routine x 2)     Status: None (Preliminary result)   Collection Time: 10/15/23  7:50 PM   Specimen: BLOOD RIGHT FOREARM  Result Value Ref Range Status   Specimen Description   Final    BLOOD RIGHT FOREARM Performed at [facility], [address], [place], [place] [address]    Special Requests   Final    BOTTLES DRAWN AEROBIC AND ANAEROBIC Blood Culture results may not be optimal due to an inadequate volume of blood received in culture bottles Performed at [facility], [address] [place], [place] [address]    Culture   Final    NO GROWTH 2 DAYS Performed at [facility], [address] [place], [place] [address]    Report Status PENDING  Incomplete  Blood culture (routine x 2)     Status: None (Preliminary result)   Collection Time: 10/15/23  8:30 PM   Specimen: BLOOD LEFT FOREARM  Result Value Ref Range Status   Specimen Description   Final    BLOOD LEFT FOREARM Performed at [facility], [address], [place], [place] [address]    Special Requests   Final    BOTTLES DRAWN AEROBIC AND ANAEROBIC Blood Culture results may not be optimal due to an inadequate volume of blood received in culture bottles Performed at [facility], [address], [place], [place] [address]    Culture   Final    NO GROWTH 1 DAY Performed at [facility], [address] [place], [place] [address]    Report Status PENDING  Incomplete  Antimicrobials/Microbiology: Anti-infectives (From admission, onward)    Start     Dose/Rate Route Frequency Ordered Stop   10/16/23 1000  cefTRIAXone (ROCEPHIN) 2 g in sodium chloride  0.9 % 100 mL IVPB        2 g 200 mL/hr over 30 Minutes Intravenous Every 24 hours 10/15/23 2221     10/16/23 1000  azithromycin (ZITHROMAX) tablet 500 mg        500 mg Oral Daily 10/15/23 2221     10/15/23 1900  cefTRIAXone (ROCEPHIN) 1 g in sodium chloride 0.9 % 100 mL IVPB        1 g 200 mL/hr over 30 Minutes Intravenous  Once 10/15/23 1853 10/15/23 2134   10/15/23 1900  azithromycin (ZITHROMAX) 500 mg in sodium chloride 0.9 % 250 mL IVPB        500 mg 250 mL/hr over 60 Minutes Intravenous  Once 10/15/23 1853 10/15/23 2359   10/15/23 1830  amoxicillin-clavulanate (AUGMENTIN) 875-125 MG per tablet 1 tablet  Status:  Discontinued        1 tablet Oral  Once 10/15/23 1823 10/15/23 1853   10/15/23 1830  azithromycin (ZITHROMAX) tablet 500 mg  Status:  Discontinued        500 mg Oral  Once 10/15/23 1823 10/15/23 1853         Component Value Date/Time   SDES  10/15/2023 2030    BLOOD LEFT FOREARM Performed at [facility], [address] [place], [place] [address]    SPECREQUEST  10/15/2023 2030    BOTTLES DRAWN AEROBIC AND ANAEROBIC Blood Culture results may not be optimal due to an inadequate volume of blood received in culture bottles Performed at [facility], [address], [place], [place]  [address]    CULT  10/15/2023 2030    NO GROWTH 1 DAY Performed at [facility], [address], [place], [place] [address]    REPTSTATUS PENDING 10/15/2023 2030     Radiology Studies: DG Chest Portable 1 View Result Date: 10/15/2023 CLINICAL DATA:  Cough, fever EXAM: PORTABLE CHEST 1 VIEW COMPARISON:  Chest radiograph 10/04/2023 FINDINGS: Stable cardiac and mediastinal contours. Interval development of patchy right upper lung and bilateral lower lung airspace opacities, right-greater-than-left. No pleural effusion or pneumothorax. Osseous structures unremarkable. IMPRESSION: Interval development of patchy right upper lung and bilateral lower lung airspace opacities, right-greater-than-left. Findings are concerning for multifocal pneumonia. Electronically Signed   By: Annia Belt M.D.   On: 10/15/2023 17:20    LOS: 2 days   Total time spent in review of labs and imaging, patient evaluation, formulation of plan, documentation and communication with family: 35 minutes  Lanae Boast, MD  [facility]  10/17/2023, 10:38 AM

## 2023-10-18 DIAGNOSIS — J159 Unspecified bacterial pneumonia: Secondary | ICD-10-CM | POA: Diagnosis not present

## 2023-10-18 MED ORDER — ALBUTEROL SULFATE (2.5 MG/3ML) 0.083% IN NEBU
2.5000 mg | INHALATION_SOLUTION | RESPIRATORY_TRACT | Status: DC | PRN
  Filled 2023-10-18: qty 3

## 2023-10-18 MED ORDER — LORATADINE 10 MG PO TABS
10.0000 mg | ORAL_TABLET | Freq: Every day | ORAL | Status: DC
  Administered 2023-10-18 – 2023-10-21 (×4): 10 mg via ORAL
  Filled 2023-10-18 (×4): qty 1

## 2023-10-18 MED ORDER — FLUTICASONE PROPIONATE 50 MCG/ACT NA SUSP
2.0000 | Freq: Every day | NASAL | Status: DC
  Administered 2023-10-18 – 2023-10-21 (×4): 2 via NASAL
  Filled 2023-10-18: qty 16

## 2023-10-18 NOTE — Plan of Care (Signed)
  Problem: Clinical Measurements: Goal: Ability to maintain clinical measurements within normal limits will improve Outcome: Progressing Goal: Diagnostic test results will improve Outcome: Progressing Goal: Respiratory complications will improve Outcome: Progressing Goal: Cardiovascular complication will be avoided Outcome: Progressing   Problem: Coping: Goal: Level of anxiety will decrease Outcome: Progressing   

## 2023-10-18 NOTE — Plan of Care (Signed)

## 2023-10-18 NOTE — Progress Notes (Signed)
PROGRESS NOTE Stacy Bolton  RUE:[id number] DOB: [birth year] DOA: 10/15/2023 PCP: No primary care provider on file.  Brief Narrative/Hospital Course: 53 y.o. female with medical history significant of polysubstance abuse, homelessness, COPD who presented to ED due to persistent cough fever chills and headache. She went to an urgent care and was diagnosed with a COPD exacerbation and was given steroids and Levaquin which she has been taking without improvement. In ED: Mild tachycardia noted hypoxic 86% sp place on 2 L [place].  Labs showed leukocytosis 14.3 normal lactic acid, influenza RSV COVID-negative. Cxr>right upper lobe and bilateral airspace opacities consistent with multifocal pneumonia.  Blood culture obtained, and admitted for further management lactic acid has remained stable, leukocytosis resolved.   Subjective:  Patient seen and examined this morning She is alert awake complains of ongoing wheezing cough and shortness of breath unable to bring up sputum  She is on 2 L nasal cannula  Assessment and Plan: Principal Problem:   Community acquired bacterial pneumonia   Acute hypoxic respiratory failure due to pneumonia Multifocal pneumonia: Patient presented with respiratory symptoms, labs remarkable for leukocytosis,cxr personally reviewed and shows pneumonia. Blood culture NGTD.  Leukocytosis resolved.  Continue empiric ceftriaxone/azithromycin bronchodilators systemic steroids Cont Pulmonary toileting-nebs/IS/increase activity.    COPD w/ acute exacerbation: Still having ongoing wheezing continue IV Solu-Medrol including bronchodilators, Pulmicort, and supplemental oxygen wean oxygen to keep spo2 > 89%  Hypothyroidism: Continue levothyroxine  GERD: Stable continue PPI  Anxiety: Mood stable, cont atarax prn  Homelessness: TOC consulted to provide resources and to help with her social issues  DVT prophylaxis: enoxaparin (LOVENOX) injection 40 mg Start: 10/16/23  1000 SCDs Start: 10/15/23 2155 Code Status:   Code Status: Full Code Family Communication: plan of care discussed with patient at bedside. Patient status is: Remains hospitalized because of severity of illness Level of care: Med-Surg   Dispo: The patient is from: Home            Anticipated disposition: Anticipate discharge tomorrow if respiratory status better  Objective: Vitals last 24 hrs: Vitals:   10/17/23 2012 10/18/23 0407 10/18/23 0736 10/18/23 0738  BP: 114/82 115/80    Pulse: 93 93    Resp: 17 20    Temp: 97.8 F (36.6 C) 97.8 F (36.6 C)    TempSrc: Oral Oral    SpO2: 97% 94% 94% 94%  Weight:      Height:       Weight change:   Physical Examination: General exam: alert awake, oriented at baseline, older than stated age HEENT:Oral mucosa moist, Ear/Nose WNL grossly Respiratory system: Bilaterally air entry present with expiratory wheezing diffusely, BS,no use of accessory muscle Cardiovascular system: S1 & S2 +, No JVD. Gastrointestinal system: Abdomen soft,NT,ND, BS+ Nervous System: Alert, awake, moving all extremities,and following commands. Extremities: LE edema neg,distal peripheral pulses palpable and warm.  Skin: No rashes,no icterus.  Face appears flushed MSK: Normal muscle bulk,tone, power   Medications reviewed:  Scheduled Meds:  arformoterol  15 mcg Nebulization BID   azithromycin  500 mg Oral Daily   budesonide (PULMICORT) nebulizer solution  0.25 mg Nebulization BID   enoxaparin (LOVENOX) injection  40 mg Subcutaneous Q24H   feeding supplement  237 mL Oral BID BM   fluticasone  2 spray Each Nare Daily   guaiFENesin  600 mg Oral BID   ipratropium-albuterol  3 mL Nebulization Q6H WA   levothyroxine  25 mcg Oral Q0600   loratadine  10 mg Oral Daily   methylPREDNISolone (  SOLU-MEDROL) injection  40 mg Intravenous BID   nicotine  21 mg Transdermal Daily   pantoprazole  40 mg Oral Daily   Continuous Infusions:  cefTRIAXone (ROCEPHIN)  IV 2 g  (10/18/23 1039)    Diet Order             Diet regular Room service appropriate? Yes; Fluid consistency: Thin  Diet effective now                 No intake or output data in the 24 hours ending 10/18/23 1155  Net IO Since Admission: 2,310.68 mL [10/18/23 1155]  Wt Readings from Last 3 Encounters:  10/15/23 47.6 kg  10/08/23 46.7 kg  10/02/23 46.7 kg     Unresulted Labs (From admission, onward)    None     Data Reviewed: I have personally reviewed following labs and imaging studies CBC: Recent Labs  Lab 10/15/23 2017 10/15/23 2025 10/16/23 1317  WBC 14.3*  --  10.4  NEUTROABS 11.3*  --   --   HGB 14.1 16.0* 12.2  HCT 43.8 47.0* 39.3  MCV 100.2*  --  102.3*  PLT 294  --  272   Basic Metabolic Panel:  Recent Labs  Lab 10/15/23 2017 10/15/23 2025  NA 134* 135  K 3.5 3.6  CL 97* 96*  CO2 27  --   GLUCOSE 98 92  BUN 8 5*  CREATININE 0.47 0.60  CALCIUM 8.7*  --    GFR: Estimated Creatinine Clearance: 60.4 mL/min (by C-G formula based on SCr of 0.6 mg/dL). Liver Function Tests:  Recent Labs  Lab 10/15/23 2017  AST 21  ALT 18  ALKPHOS 76  BILITOT 0.7  PROT 7.0  ALBUMIN 3.9  Sepsis Labs: Recent Labs  Lab 10/15/23 1852 10/16/23 0127  LATICACIDVEN 0.9 1.1   Recent Results (from the past 240 hours)  Resp panel by RT-PCR (RSV, Flu A&B, Covid) Anterior Nasal Swab     Status: None   Collection Time: 10/15/23  1:08 PM   Specimen: Anterior Nasal Swab  Result Value Ref Range Status   SARS Coronavirus 2 by RT PCR NEGATIVE NEGATIVE Final    Comment: (NOTE) SARS-CoV-2 target nucleic acids are NOT DETECTED.  The SARS-CoV-2 RNA is generally detectable in upper respiratory specimens during the acute phase of infection. The lowest concentration of SARS-CoV-2 viral copies this assay can detect is 138 copies/mL. A negative result does not preclude SARS-Cov-2 infection and should not be used as the sole basis for treatment or other patient management decisions.  A negative result may occur with  improper specimen collection/handling, submission of specimen other than nasopharyngeal swab, presence of viral mutation(s) within the areas targeted by this assay, and inadequate number of viral copies(<138 copies/mL). A negative result must be combined with clinical observations, patient history, and epidemiological information. The expected result is Negative.  Fact Sheet for Patients:  BloggerCourse.com  Fact Sheet for Healthcare Providers:  SeriousBroker.it  This test is no t yet approved or cleared by the [place] FDA and  has been authorized for detection and/or diagnosis of SARS-CoV-2 by FDA under an Emergency Use Authorization (EUA). This EUA will remain  in effect (meaning this test can be used) for the duration of the COVID-19 declaration under Section 564(b)(1) of the Act, 21 U.S.C.section 360bbb-3(b)(1), unless the authorization is terminated  or revoked sooner.       Influenza A by PCR NEGATIVE NEGATIVE Final   Influenza B by PCR NEGATIVE NEGATIVE  Final    Comment: (NOTE) The Xpert Xpress SARS-CoV-2/FLU/RSV plus assay is intended as an aid in the diagnosis of influenza from Nasopharyngeal swab specimens and should not be used as a sole basis for treatment. Nasal washings and aspirates are unacceptable for Xpert Xpress SARS-CoV-2/FLU/RSV testing.  Fact Sheet for Patients: BloggerCourse.com  Fact Sheet for Healthcare Providers: SeriousBroker.it  This test is not yet approved or cleared by the [place] FDA and has been authorized for detection and/or diagnosis of SARS-CoV-2 by FDA under an Emergency Use Authorization (EUA). This EUA will remain in effect (meaning this test can be used) for the duration of the COVID-19 declaration under Section 564(b)(1) of the Act, 21 U.S.C. section 360bbb-3(b)(1), unless the authorization  is terminated or revoked.     Resp Syncytial Virus by PCR NEGATIVE NEGATIVE Final    Comment: (NOTE) Fact Sheet for Patients: BloggerCourse.com  Fact Sheet for Healthcare Providers: SeriousBroker.it  This test is not yet approved or cleared by the [place] FDA and has been authorized for detection and/or diagnosis of SARS-CoV-2 by FDA under an Emergency Use Authorization (EUA). This EUA will remain in effect (meaning this test can be used) for the duration of the COVID-19 declaration under Section 564(b)(1) of the Act, 21 U.S.C. section 360bbb-3(b)(1), unless the authorization is terminated or revoked.  Performed at [facility], [address] [place], [place] [address]   Blood culture (routine x 2)     Status: None (Preliminary result)   Collection Time: 10/15/23  7:50 PM   Specimen: BLOOD RIGHT FOREARM  Result Value Ref Range Status   Specimen Description   Final    BLOOD RIGHT FOREARM Performed at [facility], [address], [place], [place] [address]    Special Requests   Final    BOTTLES DRAWN AEROBIC AND ANAEROBIC Blood Culture results may not be optimal due to an inadequate volume of blood received in culture bottles Performed at [facility], [address], [place], [place] [address]    Culture   Final    NO GROWTH 3 DAYS Performed at [facility], [address] [place], [place] [address]    Report Status PENDING  Incomplete  Blood culture (routine x 2)     Status: None (Preliminary result)   Collection Time: 10/15/23  8:30 PM   Specimen: BLOOD LEFT FOREARM  Result Value Ref Range Status   Specimen Description   Final    BLOOD LEFT FOREARM Performed at [facility], [address] [place], [place] [address]    Special Requests   Final    BOTTLES DRAWN AEROBIC AND ANAEROBIC Blood Culture results may not be optimal due  to an inadequate volume of blood received in culture bottles Performed at [facility], [address], [place], [place] [address]    Culture   Final    NO GROWTH 2 DAYS Performed at [facility], [address], [place], [place] [address]    Report Status PENDING  Incomplete  Antimicrobials/Microbiology: Anti-infectives (From admission, onward)    Start     Dose/Rate Route Frequency Ordered Stop   10/16/23 1000  cefTRIAXone (ROCEPHIN) 2 g in sodium chloride 0.9 % 100 mL IVPB        2 g 200 mL/hr over 30 Minutes Intravenous Every 24 hours 10/15/23 2221     10/16/23 1000  azithromycin (ZITHROMAX) tablet 500 mg  500 mg Oral Daily 10/15/23 2221     10/15/23 1900  cefTRIAXone (ROCEPHIN) 1 g in sodium chloride 0.9 % 100 mL IVPB        1 g 200 mL/hr over 30 Minutes Intravenous  Once 10/15/23 1853 10/15/23 2134   10/15/23 1900  azithromycin (ZITHROMAX) 500 mg in sodium chloride 0.9 % 250 mL IVPB        500 mg 250 mL/hr over 60 Minutes Intravenous  Once 10/15/23 1853 10/15/23 2359   10/15/23 1830  amoxicillin-clavulanate (AUGMENTIN) 875-125 MG per tablet 1 tablet  Status:  Discontinued        1 tablet Oral  Once 10/15/23 1823 10/15/23 1853   10/15/23 1830  azithromycin (ZITHROMAX) tablet 500 mg  Status:  Discontinued        500 mg Oral  Once 10/15/23 1823 10/15/23 1853         Component Value Date/Time   SDES  10/15/2023 2030    BLOOD LEFT FOREARM Performed at [facility], [address], [place], [place] [address]    SPECREQUEST  10/15/2023 2030    BOTTLES DRAWN AEROBIC AND ANAEROBIC Blood Culture results may not be optimal due to an inadequate volume of blood received in culture bottles Performed at [facility], [address] [place], [place] [address]    CULT  10/15/2023 2030    NO GROWTH 2 DAYS Performed at [facility], [address] [place], [place] [address]    REPTSTATUS PENDING 10/15/2023  2030     Radiology Studies: No results found.   LOS: 3 days   Total time spent in review of labs and imaging, patient evaluation, formulation of plan, documentation and communication with family: 35 minutes  Lanae Boast, MD  [facility]  10/18/2023, 11:55 AM

## 2023-10-19 DIAGNOSIS — J159 Unspecified bacterial pneumonia: Secondary | ICD-10-CM | POA: Diagnosis not present

## 2023-10-19 MED ORDER — HYDROXYZINE HCL 25 MG PO TABS
25.0000 mg | ORAL_TABLET | Freq: Three times a day (TID) | ORAL | Status: DC | PRN
  Administered 2023-10-19 – 2023-10-21 (×4): 25 mg via ORAL
  Filled 2023-10-19 (×5): qty 1

## 2023-10-19 MED ORDER — IPRATROPIUM-ALBUTEROL 0.5-2.5 (3) MG/3ML IN SOLN
3.0000 mL | Freq: Three times a day (TID) | RESPIRATORY_TRACT | Status: DC
  Administered 2023-10-19 – 2023-10-21 (×6): 3 mL via RESPIRATORY_TRACT
  Filled 2023-10-19 (×6): qty 3

## 2023-10-19 NOTE — Progress Notes (Signed)
 PROGRESS NOTE Stacy FERN  [place]:[id number] DOB: 07-25-1971 DOA: 10/15/2023 PCP: No primary care provider on file.  Brief Narrative/Hospital Course: 53 y.o. female with medical history significant of polysubstance abuse, homelessness, COPD who presented to ED due to persistent cough fever chills and headache. She went to an urgent care and was diagnosed with a COPD exacerbation and was given steroids and Levaquin which she has been taking without improvement. In ED: Mild tachycardia noted hypoxic 86% sp place on 2 L [place].  Labs showed leukocytosis 14.3 normal lactic acid, influenza RSV COVID-negative. Cxr>right upper lobe and bilateral airspace opacities consistent with multifocal pneumonia.  Blood culture obtained, and admitted for further management lactic acid has remained stable, leukocytosis resolved.   Subjective: Seen and examined, Overnight vitals/labs/events reviewed  C/O Ongoing wheezing She is very anxious to get back to the shelter given her ongoing wheezing  Assessment and Plan: Principal Problem:   Community acquired bacterial pneumonia   Acute hypoxic respiratory failure due to pneumonia Multifocal pneumonia: Patient presented with respiratory symptoms, labs remarkable for leukocytosis,cxr personally reviewed and shows pneumonia. Blood culture NGTD.  Leukocytosis resolved.  Clinically improving but without wheezing.   Cont ceftriaxone/azithromycin, bronchodilators systemic steroids.  Added incentive spirometry, flutter valve.  COPD w/ acute exacerbation: Having ongoing wheezing, continue Solu-Medrol bronchodilators Pulmicort -try to wean off oxygen   Hypothyroidism: Continue levothyroxine  GERD: Stable continue PPI  Anxiety: Mood stable, cont atarax prn  Left hand fracture from fall: following up with hand surgeon for possible surgery at some point  Homelessness: TOC consulted to provide resources and to help with her social issues.  He anxious and elected  to return to shelter  DVT prophylaxis: enoxaparin (LOVENOX) injection 40 mg Start: 10/16/23 1000 SCDs Start: 10/15/23 2155 Code Status:   Code Status: Full Code Family Communication: plan of care discussed with patient at bedside. Patient status is: Remains hospitalized because of severity of illness Level of care: Med-Surg   Dispo: The patient is from: Home            Anticipated disposition: Anticipate discharge tomorrow if able to come off oxygen  Objective: Vitals last 24 hrs: Vitals:   10/18/23 1857 10/18/23 1927 10/19/23 0204 10/19/23 0403  BP:  125/77  114/77  Pulse:    97  Resp:  16  17  Temp:  97.9 F (36.6 C)  98.1 F (36.7 C)  TempSrc:    Oral  SpO2: 92% 92% 91% 92%  Weight:      Height:       Weight change:   Physical Examination: General exam: alert awake, oriented at baseline, older than stated age HEENT:Oral mucosa moist, Ear/Nose WNL grossly Respiratory system: Bilaterally air entry present with diffuse expiratory wheezing,no use of accessory muscle Cardiovascular system: S1 & S2 +, No JVD. Gastrointestinal system: Abdomen soft,NT,ND, BS+ Nervous System: Alert, awake, moving all extremities,and following commands. Extremities: LE edema neg,distal peripheral pulses palpable and warm.  Skin: No rashes,no icterus. MSK: Normal muscle bulk,tone, power   Medications reviewed:  Scheduled Meds:  arformoterol  15 mcg Nebulization BID   azithromycin  500 mg Oral Daily   budesonide (PULMICORT) nebulizer solution  0.25 mg Nebulization BID   enoxaparin (LOVENOX) injection  40 mg Subcutaneous Q24H   feeding supplement  237 mL Oral BID BM   fluticasone  2 spray Each Nare Daily   guaiFENesin  600 mg Oral BID   ipratropium-albuterol  3 mL Nebulization Q6H WA   levothyroxine  25 mcg  Oral Q0600   loratadine  10 mg Oral Daily   methylPREDNISolone (SOLU-MEDROL) injection  40 mg Intravenous BID   nicotine  21 mg Transdermal Daily   pantoprazole  40 mg Oral Daily    Continuous Infusions:  cefTRIAXone (ROCEPHIN)  IV 2 g (10/18/23 1039)    Diet Order             Diet regular Room service appropriate? Yes; Fluid consistency: Thin  Diet effective now                  Intake/Output Summary (Last 24 hours) at 10/19/2023 0929 Last data filed at 10/18/2023 1230 Gross per 24 hour  Intake 480 ml  Output --  Net 480 ml    Net IO Since Admission: 2,790.68 mL [10/19/23 0929]  Wt Readings from Last 3 Encounters:  10/15/23 47.6 kg  10/08/23 46.7 kg  10/02/23 46.7 kg     Unresulted Labs (From admission, onward)    None     Data Reviewed: I have personally reviewed following labs and imaging studies CBC: Recent Labs  Lab 10/15/23 2017 10/15/23 2025 10/16/23 1317  WBC 14.3*  --  10.4  NEUTROABS 11.3*  --   --   HGB 14.1 16.0* 12.2  HCT 43.8 47.0* 39.3  MCV 100.2*  --  102.3*  PLT 294  --  272   Basic Metabolic Panel:  Recent Labs  Lab 10/15/23 2017 10/15/23 2025  NA 134* 135  K 3.5 3.6  CL 97* 96*  CO2 27  --   GLUCOSE 98 92  BUN 8 5*  CREATININE 0.47 0.60  CALCIUM 8.7*  --    GFR: Estimated Creatinine Clearance: 60.4 mL/min (by C-G formula based on SCr of 0.6 mg/dL). Liver Function Tests:  Recent Labs  Lab 10/15/23 2017  AST 21  ALT 18  ALKPHOS 76  BILITOT 0.7  PROT 7.0  ALBUMIN 3.9  Sepsis Labs: Recent Labs  Lab 10/15/23 1852 10/16/23 0127  LATICACIDVEN 0.9 1.1   Recent Results (from the past 240 hours)  Resp panel by RT-PCR (RSV, Flu A&B, Covid) Anterior Nasal Swab     Status: None   Collection Time: 10/15/23  1:08 PM   Specimen: Anterior Nasal Swab  Result Value Ref Range Status   SARS Coronavirus 2 by RT PCR NEGATIVE NEGATIVE Final    Comment: (NOTE) SARS-CoV-2 target nucleic acids are NOT DETECTED.  The SARS-CoV-2 RNA is generally detectable in upper respiratory specimens during the acute phase of infection. The lowest concentration of SARS-CoV-2 viral copies this assay can detect is 138 copies/mL.  A negative result does not preclude SARS-Cov-2 infection and should not be used as the sole basis for treatment or other patient management decisions. A negative result may occur with  improper specimen collection/handling, submission of specimen other than nasopharyngeal swab, presence of viral mutation(s) within the areas targeted by this assay, and inadequate number of viral copies(<138 copies/mL). A negative result must be combined with clinical observations, patient history, and epidemiological information. The expected result is Negative.  Fact Sheet for Patients:  BloggerCourse.com  Fact Sheet for Healthcare Providers:  SeriousBroker.it  This test is no t yet approved or cleared by the [place] FDA and  has been authorized for detection and/or diagnosis of SARS-CoV-2 by FDA under an Emergency Use Authorization (EUA). This EUA will remain  in effect (meaning this test can be used) for the duration of the COVID-19 declaration under Section 564(b)(1) of the Act,  21 U.S.C.section 360bbb-3(b)(1), unless the authorization is terminated  or revoked sooner.       Influenza A by PCR NEGATIVE NEGATIVE Final   Influenza B by PCR NEGATIVE NEGATIVE Final    Comment: (NOTE) The Xpert Xpress SARS-CoV-2/FLU/RSV plus assay is intended as an aid in the diagnosis of influenza from Nasopharyngeal swab specimens and should not be used as a sole basis for treatment. Nasal washings and aspirates are unacceptable for Xpert Xpress SARS-CoV-2/FLU/RSV testing.  Fact Sheet for Patients: BloggerCourse.com  Fact Sheet for Healthcare Providers: SeriousBroker.it  This test is not yet approved or cleared by the [place] FDA and has been authorized for detection and/or diagnosis of SARS-CoV-2 by FDA under an Emergency Use Authorization (EUA). This EUA will remain in effect (meaning this test  can be used) for the duration of the COVID-19 declaration under Section 564(b)(1) of the Act, 21 U.S.C. section 360bbb-3(b)(1), unless the authorization is terminated or revoked.     Resp Syncytial Virus by PCR NEGATIVE NEGATIVE Final    Comment: (NOTE) Fact Sheet for Patients: BloggerCourse.com  Fact Sheet for Healthcare Providers: SeriousBroker.it  This test is not yet approved or cleared by the [place] FDA and has been authorized for detection and/or diagnosis of SARS-CoV-2 by FDA under an Emergency Use Authorization (EUA). This EUA will remain in effect (meaning this test can be used) for the duration of the COVID-19 declaration under Section 564(b)(1) of the Act, 21 U.S.C. section 360bbb-3(b)(1), unless the authorization is terminated or revoked.  Performed at [facility], [address], [place], [place] [address]   Blood culture (routine x 2)     Status: None (Preliminary result)   Collection Time: 10/15/23  7:50 PM   Specimen: BLOOD RIGHT FOREARM  Result Value Ref Range Status   Specimen Description   Final    BLOOD RIGHT FOREARM Performed at [facility], [address], [place], [place] [address]    Special Requests   Final    BOTTLES DRAWN AEROBIC AND ANAEROBIC Blood Culture results may not be optimal due to an inadequate volume of blood received in culture bottles Performed at [facility], [address] [place], [place] [address]    Culture   Final    NO GROWTH 4 DAYS Performed at [facility], [address], [place], [place] [address]    Report Status PENDING  Incomplete  Blood culture (routine x 2)     Status: None (Preliminary result)   Collection Time: 10/15/23  8:30 PM   Specimen: BLOOD LEFT FOREARM  Result Value Ref Range Status   Specimen Description   Final    BLOOD LEFT FOREARM Performed at [facility], [address], [place], [place] [address]    Special Requests   Final    BOTTLES DRAWN AEROBIC AND ANAEROBIC Blood Culture results may not be optimal due to an inadequate volume of blood received in culture bottles Performed at [facility], [address] [place], [place] [address]    Culture   Final    NO GROWTH 3 DAYS Performed at [facility], [address], [place], [place] [address]    Report Status PENDING  Incomplete  Antimicrobials/Microbiology: Anti-infectives (From admission, onward)    Start     Dose/Rate Route Frequency Ordered Stop   10/16/23 1000  cefTRIAXone (ROCEPHIN) 2 g in sodium chloride 0.9 % 100 mL IVPB  2 g 200 mL/hr over 30 Minutes Intravenous Every 24 hours 10/15/23 2221     10/16/23 1000  azithromycin (ZITHROMAX) tablet 500 mg        500 mg Oral Daily 10/15/23 2221     10/15/23 1900  cefTRIAXone (ROCEPHIN) 1 g in sodium chloride 0.9 % 100 mL IVPB        1 g 200 mL/hr over 30 Minutes Intravenous  Once 10/15/23 1853 10/15/23 2134   10/15/23 1900  azithromycin (ZITHROMAX) 500 mg in sodium chloride 0.9 % 250 mL IVPB        500 mg 250 mL/hr over 60 Minutes Intravenous  Once 10/15/23 1853 10/15/23 2359   10/15/23 1830  amoxicillin-clavulanate (AUGMENTIN) 875-125 MG per tablet 1 tablet  Status:  Discontinued        1 tablet Oral  Once 10/15/23 1823 10/15/23 1853   10/15/23 1830  azithromycin (ZITHROMAX) tablet 500 mg  Status:  Discontinued        500 mg Oral  Once 10/15/23 1823 10/15/23 1853         Component Value Date/Time   SDES  10/15/2023 2030    BLOOD LEFT FOREARM Performed at [facility], [address], [place], [place] [address]    SPECREQUEST  10/15/2023 2030    BOTTLES DRAWN AEROBIC AND ANAEROBIC Blood Culture results may not be optimal due to an inadequate volume of blood received in culture bottles Performed at [facility], [address], [place], [place] [address]    CULT   10/15/2023 2030    NO GROWTH 3 DAYS Performed at [facility], [address] [place], [place] [address]    REPTSTATUS PENDING 10/15/2023 2030     Radiology Studies: No results found.   LOS: 4 days   Total time spent in review of labs and imaging, patient evaluation, formulation of plan, documentation and communication with family: 35 minutes  Lanae Boast, MD  [facility]  10/19/2023, 9:29 AM

## 2023-10-19 NOTE — Plan of Care (Signed)

## 2023-10-20 DIAGNOSIS — J159 Unspecified bacterial pneumonia: Secondary | ICD-10-CM | POA: Diagnosis not present

## 2023-10-20 LAB — CULTURE, BLOOD (ROUTINE X 2): Culture: NO GROWTH

## 2023-10-20 NOTE — Plan of Care (Signed)

## 2023-10-20 NOTE — Progress Notes (Signed)
 PROGRESS NOTE Stacy GUEVARA  OZH:[id number] DOB: 1971/02/17 DOA: 10/15/2023 PCP: No primary care provider on file.  Brief Narrative/Hospital Course: 53 y.o. female with medical history significant of polysubstance abuse, homelessness, COPD who presented to ED due to persistent cough fever chills and headache. She went to an urgent care and was diagnosed with a COPD exacerbation and was given steroids and Levaquin which she has been taking without improvement. In ED: Mild tachycardia noted hypoxic 86% sp place on 2 L .  Labs showed leukocytosis 14.3 normal lactic acid, influenza RSV COVID-negative. Cxr>right upper lobe and bilateral airspace opacities consistent with multifocal pneumonia.  Blood culture obtained, and admitted for further management lactic acid has remained stable, leukocytosis resolved.   Subjective: Seen this morning Still complains of cough shortness of breath,  Got dyspneic after being off oxygen  RN working on weaning to RA  Assessment and Plan: Principal Problem:   Community acquired bacterial pneumonia   Acute hypoxic respiratory failure due to pneumonia Multifocal pneumonia: Patient presented with respiratory symptoms, labs remarkable for leukocytosis,cxr personally reviewed and shows pneumonia. Blood culture NGTD.  Leukocytosis resolved.  Clinically improving but without wheezing.   Cont ceftriaxone/azithromycin to complete the course along with steroids bronchodilators incentive spirometry, FV, hopefully can wean off  O2 soon  COPD w/ acute exacerbation: Patient is very slow to improve, continue on IV systemic steroid, Pulmicort bronchodilators supplemental oxygen and wean as tolerated   Hypothyroidism: Continue levothyroxine  GERD: Stable continue PPI  Anxiety: Mood stable, cont atarax prn  Left hand fracture from fall: following up with hand surgeon for possible surgery at some point  Homelessness: TOC consulted to provide resources and to  help with her social issues.  He anxious and elected to return to shelter  DVT prophylaxis: enoxaparin (LOVENOX) injection 40 mg Start: 10/16/23 1000 SCDs Start: 10/15/23 2155 Code Status:   Code Status: Full Code Family Communication: plan of care discussed with patient at bedside. Patient status is: Remains hospitalized because of severity of illness Level of care: Med-Surg   Dispo: The patient is from: Home            Anticipated disposition: Anticipate discharge tomorrow if able to come off oxygen  Objective: Vitals last 24 hrs: Vitals:   10/19/23 2008 10/19/23 2012 10/20/23 0417 10/20/23 1033  BP: (!) 131/91  (!) 148/90   Pulse: (!) 102  (!) 104   Resp: 19  20   Temp: 98.5 F (36.9 C)  97.9 F (36.6 C)   TempSrc:      SpO2: 92% 92% 95% 92%  Weight:      Height:       Weight change:   Physical Examination: General exam: alert awake, HEENT:Oral mucosa moist, Ear/Nose WNL grossly Respiratory system: Bilaterally diffuse expiratory wheezing,no use of accessory muscle Cardiovascular system: S1 & S2 +, No JVD. Gastrointestinal system: Abdomen soft,NT,ND, BS+ Nervous System: Alert, awake, moving all extremities,and following commands. Extremities: LE edema neg,distal peripheral pulses palpable and warm.  Skin: No rashes,no icterus. MSK: Normal muscle bulk,tone, power   Medications reviewed:  Scheduled Meds:  arformoterol  15 mcg Nebulization BID   azithromycin  500 mg Oral Daily   budesonide (PULMICORT) nebulizer solution  0.25 mg Nebulization BID   enoxaparin (LOVENOX) injection  40 mg Subcutaneous Q24H   feeding supplement  237 mL Oral BID BM   fluticasone  2 spray Each Nare Daily   guaiFENesin  600 mg Oral BID   ipratropium-albuterol  3 mL  Nebulization TID   levothyroxine  25 mcg Oral Q0600   loratadine  10 mg Oral Daily   methylPREDNISolone (SOLU-MEDROL) injection  40 mg Intravenous BID   nicotine  21 mg Transdermal Daily   pantoprazole  40 mg Oral Daily    Continuous Infusions:  cefTRIAXone (ROCEPHIN)  IV 2 g (10/20/23 9147)    Diet Order             Diet regular Room service appropriate? Yes; Fluid consistency: Thin  Diet effective now                 No intake or output data in the 24 hours ending 10/20/23 1246   Net IO Since Admission: 2,890.68 mL [10/20/23 1246]  Wt Readings from Last 3 Encounters:  10/15/23 47.6 kg  10/08/23 46.7 kg  10/02/23 46.7 kg     Unresulted Labs (From admission, onward)    None     Data Reviewed: I have personally reviewed following labs and imaging studies CBC: Recent Labs  Lab 10/15/23 2017 10/15/23 2025 10/16/23 1317  WBC 14.3*  --  10.4  NEUTROABS 11.3*  --   --   HGB 14.1 16.0* 12.2  HCT 43.8 47.0* 39.3  MCV 100.2*  --  102.3*  PLT 294  --  272   Basic Metabolic Panel:  Recent Labs  Lab 10/15/23 2017 10/15/23 2025  NA 134* 135  K 3.5 3.6  CL 97* 96*  CO2 27  --   GLUCOSE 98 92  BUN 8 5*  CREATININE 0.47 0.60  CALCIUM 8.7*  --    GFR: Estimated Creatinine Clearance: 60.4 mL/min (by C-G formula based on SCr of 0.6 mg/dL). Liver Function Tests:  Recent Labs  Lab 10/15/23 2017  AST 21  ALT 18  ALKPHOS 76  BILITOT 0.7  PROT 7.0  ALBUMIN 3.9  Sepsis Labs: Recent Labs  Lab 10/15/23 1852 10/16/23 0127  LATICACIDVEN 0.9 1.1   Recent Results (from the past 240 hours)  Resp panel by RT-PCR (RSV, Flu A&B, Covid) Anterior Nasal Swab     Status: None   Collection Time: 10/15/23  1:08 PM   Specimen: Anterior Nasal Swab  Result Value Ref Range Status   SARS Coronavirus 2 by RT PCR NEGATIVE NEGATIVE Final    Comment: (NOTE) SARS-CoV-2 target nucleic acids are NOT DETECTED.  The SARS-CoV-2 RNA is generally detectable in upper respiratory specimens during the acute phase of infection. The lowest concentration of SARS-CoV-2 viral copies this assay can detect is 138 copies/mL. A negative result does not preclude SARS-Cov-2 infection and should not be used as the  sole basis for treatment or other patient management decisions. A negative result may occur with  improper specimen collection/handling, submission of specimen other than nasopharyngeal swab, presence of viral mutation(s) within the areas targeted by this assay, and inadequate number of viral copies(<138 copies/mL). A negative result must be combined with clinical observations, patient history, and epidemiological information. The expected result is Negative.  Fact Sheet for Patients:  BloggerCourse.com  Fact Sheet for Healthcare Providers:  SeriousBroker.it  This test is no t yet approved or cleared by the [place] FDA and  has been authorized for detection and/or diagnosis of SARS-CoV-2 by FDA under an Emergency Use Authorization (EUA). This EUA will remain  in effect (meaning this test can be used) for the duration of the COVID-19 declaration under Section 564(b)(1) of the Act, 21 U.S.C.section 360bbb-3(b)(1), unless the authorization is terminated  or revoked  sooner.       Influenza A by PCR NEGATIVE NEGATIVE Final   Influenza B by PCR NEGATIVE NEGATIVE Final    Comment: (NOTE) The Xpert Xpress SARS-CoV-2/FLU/RSV plus assay is intended as an aid in the diagnosis of influenza from Nasopharyngeal swab specimens and should not be used as a sole basis for treatment. Nasal washings and aspirates are unacceptable for Xpert Xpress SARS-CoV-2/FLU/RSV testing.  Fact Sheet for Patients: BloggerCourse.com  Fact Sheet for Healthcare Providers: SeriousBroker.it  This test is not yet approved or cleared by the [place] FDA and has been authorized for detection and/or diagnosis of SARS-CoV-2 by FDA under an Emergency Use Authorization (EUA). This EUA will remain in effect (meaning this test can be used) for the duration of the COVID-19 declaration under Section 564(b)(1) of the  Act, 21 U.S.C. section 360bbb-3(b)(1), unless the authorization is terminated or revoked.     Resp Syncytial Virus by PCR NEGATIVE NEGATIVE Final    Comment: (NOTE) Fact Sheet for Patients: BloggerCourse.com  Fact Sheet for Healthcare Providers: SeriousBroker.it  This test is not yet approved or cleared by the [place] FDA and has been authorized for detection and/or diagnosis of SARS-CoV-2 by FDA under an Emergency Use Authorization (EUA). This EUA will remain in effect (meaning this test can be used) for the duration of the COVID-19 declaration under Section 564(b)(1) of the Act, 21 U.S.C. section 360bbb-3(b)(1), unless the authorization is terminated or revoked.  Performed at [facility], [address], [place], [place] [address]   Blood culture (routine x 2)     Status: None   Collection Time: 10/15/23  7:50 PM   Specimen: BLOOD RIGHT FOREARM  Result Value Ref Range Status   Specimen Description   Final    BLOOD RIGHT FOREARM Performed at [facility], [address], [place], [place] [address]    Special Requests   Final    BOTTLES DRAWN AEROBIC AND ANAEROBIC Blood Culture results may not be optimal due to an inadequate volume of blood received in culture bottles Performed at [facility], [address], [place], [place] [address]    Culture   Final    NO GROWTH 5 DAYS Performed at [facility], [address] [place], [place] [address]    Report Status 10/20/2023 FINAL  Final  Blood culture (routine x 2)     Status: None (Preliminary result)   Collection Time: 10/15/23  8:30 PM   Specimen: BLOOD LEFT FOREARM  Result Value Ref Range Status   Specimen Description   Final    BLOOD LEFT FOREARM Performed at [facility], [address] [place], [place] [address]    Special Requests   Final    BOTTLES DRAWN AEROBIC AND  ANAEROBIC Blood Culture results may not be optimal due to an inadequate volume of blood received in culture bottles Performed at [facility], [address], [place], [place] [address]    Culture   Final    NO GROWTH 4 DAYS Performed at [facility], [address], [place], [place] [address]    Report Status PENDING  Incomplete  Antimicrobials/Microbiology: Anti-infectives (From admission, onward)    Start     Dose/Rate Route Frequency Ordered Stop   10/16/23 1000  cefTRIAXone (ROCEPHIN) 2 g in sodium chloride 0.9 % 100 mL IVPB        2 g 200 mL/hr over 30 Minutes Intravenous Every  24 hours 10/15/23 2221     10/16/23 1000  azithromycin (ZITHROMAX) tablet 500 mg        500 mg Oral Daily 10/15/23 2221     10/15/23 1900  cefTRIAXone (ROCEPHIN) 1 g in sodium chloride 0.9 % 100 mL IVPB        1 g 200 mL/hr over 30 Minutes Intravenous  Once 10/15/23 1853 10/15/23 2134   10/15/23 1900  azithromycin (ZITHROMAX) 500 mg in sodium chloride 0.9 % 250 mL IVPB        500 mg 250 mL/hr over 60 Minutes Intravenous  Once 10/15/23 1853 10/15/23 2359   10/15/23 1830  amoxicillin-clavulanate (AUGMENTIN) 875-125 MG per tablet 1 tablet  Status:  Discontinued        1 tablet Oral  Once 10/15/23 1823 10/15/23 1853   10/15/23 1830  azithromycin (ZITHROMAX) tablet 500 mg  Status:  Discontinued        500 mg Oral  Once 10/15/23 1823 10/15/23 1853         Component Value Date/Time   SDES  10/15/2023 2030    BLOOD LEFT FOREARM Performed at [facility], [address], [place], [place] [address]    SPECREQUEST  10/15/2023 2030    BOTTLES DRAWN AEROBIC AND ANAEROBIC Blood Culture results may not be optimal due to an inadequate volume of blood received in culture bottles Performed at [facility], [address], [place], [place] [address]    CULT  10/15/2023 2030    NO GROWTH 4 DAYS Performed at [facility], [address],  [place], [place] [address]    REPTSTATUS PENDING 10/15/2023 2030     Radiology Studies: No results found.   LOS: 5 days   Total time spent in review of labs and imaging, patient evaluation, formulation of plan, documentation and communication with family: 35 minutes  Lanae Boast, MD  [facility]  10/20/2023, 12:46 PM

## 2023-10-20 NOTE — Progress Notes (Signed)
 SATURATION QUALIFICATIONS: (This note is used to comply with regulatory documentation for home oxygen)  Patient Saturations on Room Air at Rest = 88%  Patient Saturations on Room Air while Ambulating = 88%  Patient Saturations on 2 Liters of oxygen while Ambulating = 96%  Please briefly explain why patient needs home oxygen:

## 2023-10-21 ENCOUNTER — Other Ambulatory Visit (HOSPITAL_COMMUNITY): Payer: Self-pay

## 2023-10-21 DIAGNOSIS — J159 Unspecified bacterial pneumonia: Secondary | ICD-10-CM | POA: Diagnosis not present

## 2023-10-21 LAB — CULTURE, BLOOD (ROUTINE X 2): Culture: NO GROWTH

## 2023-10-21 MED ORDER — PREDNISONE 10 MG PO TABS
ORAL_TABLET | ORAL | 0 refills | Status: DC
  Filled 2023-10-21: qty 20, 8d supply, fill #0

## 2023-10-21 MED ORDER — LORATADINE 10 MG PO TABS
10.0000 mg | ORAL_TABLET | Freq: Every day | ORAL | 0 refills | Status: DC
  Filled 2023-10-21: qty 14, 14d supply, fill #0

## 2023-10-21 MED ORDER — GUAIFENESIN-DM 100-10 MG/5ML PO SYRP
10.0000 mL | ORAL_SOLUTION | ORAL | Status: DC | PRN
  Administered 2023-10-21: 10 mL via ORAL
  Filled 2023-10-21: qty 10

## 2023-10-21 MED ORDER — GUAIFENESIN-DM 100-10 MG/5ML PO SYRP
10.0000 mL | ORAL_SOLUTION | ORAL | 0 refills | Status: DC | PRN
  Filled 2023-10-21: qty 118, 2d supply, fill #0
  Filled 2023-10-22: qty 237, 4d supply, fill #0
  Filled 2023-10-22: qty 118, 2d supply, fill #0
  Filled 2023-10-22: qty 237, 4d supply, fill #0

## 2023-10-21 MED ORDER — PANTOPRAZOLE SODIUM 40 MG PO TBEC
40.0000 mg | DELAYED_RELEASE_TABLET | Freq: Every day | ORAL | 0 refills | Status: DC
  Filled 2023-10-21: qty 30, 30d supply, fill #0

## 2023-10-21 NOTE — Discharge Summary (Signed)
 Physician Discharge Summary  Stacy Bolton:[id number] DOB: Jul 22, 1971 DOA: 10/15/2023  PCP: No primary care provider on file.  Admit date: 10/15/2023 Discharge date: 10/21/2023 Recommendations for Outpatient Follow-up:  Follow up with PCP in 1 weeks-call for appointment Please obtain BMP/CBC in one week  Discharge Dispo: Shelter Discharge Condition: Stable Code Status:   Code Status: Full Code Diet recommendation:  Diet Order             Diet regular Room service appropriate? Yes; Fluid consistency: Thin  Diet effective now                    Brief/Interim Summary: 53 y.o. female with medical history significant of polysubstance abuse, homelessness, COPD who presented to ED due to persistent cough fever chills and headache. She went to an urgent care and was diagnosed with a COPD exacerbation and was given steroids and Levaquin which she has been taking without improvement. In ED: Mild tachycardia noted hypoxic 86% sp place on 2 L [place].  Labs showed leukocytosis 14.3 normal lactic acid, influenza RSV COVID-negative. Cxr>right upper lobe and bilateral airspace opacities consistent with multifocal pneumonia.  Blood culture obtained, and admitted for further management lactic acid has remained stable, leukocytosis resolved.  Patient is slow to improve her ongoing wheezing, managed with systemic steroid empiric antibiotics supplemental oxygen.  Able to wean off to room air at rest on 2/24, we did ambulatory oxygen testing and i no longer needing oxygen. She will be discharged to shelter.   Discharge Diagnoses:  Principal Problem:   Community acquired bacterial pneumonia  Acute hypoxic respiratory failure due to pneumonia Multifocal pneumonia: Patient presented with respiratory symptoms, labs remarkable for leukocytosis,cxr personally reviewed and shows pneumonia. Blood culture NGTD.  Leukocytosis resolved.  Clinically improved.afebrile she completed antibiotics, will discharge  her on oral steroid taper along with home regimen of inhalers.    COPD w/ acute exacerbation: Patient is very slow to improve, likely has underlying chronic wheezing at this time not needing oxygen.  Will discharge home on oral prednisone taper-antitussives   Hypothyroidism: Continue levothyroxine  GERD: Stable continue PPI  Anxiety: Mood stable, cont atarax prn  Left hand fracture from fall: following up with hand surgeon for possible surgery at some point  Homelessness: TOC consulted to provide resources and to help with her social issues.  Consults: none Subjective: Alert awake oriented resting comfortably able to count oxygen this morning.  Discharge Exam: Vitals:   10/21/23 1149 10/21/23 1208  BP:  127/86  Pulse: (!) 111 (!) 109  Resp:  16  Temp:  98.4 F (36.9 C)  SpO2: 93% 94%   General: Pt is alert, awake, not in acute distress Cardiovascular: RRR, S1/S2 +, no rubs, no gallops Respiratory: CTA bilaterally, no wheezing, no rhonchi Abdominal: Soft, NT, ND, bowel sounds + Extremities: no edema, no cyanosis  Discharge Instructions  Discharge Instructions     Discharge instructions   Complete by: As directed    Please call call MD or return to ER for similar or worsening recurring problem that brought you to hospital or if any fever,nausea/vomiting,abdominal pain, uncontrolled pain, chest pain,  shortness of breath or any other alarming symptoms.  Please follow-up your doctor as instructed in a week time and call the office for appointment.  Please avoid alcohol, smoking, or any other illicit substance and maintain healthy habits including taking your regular medications as prescribed.  You were cared for by a hospitalist during your hospital stay. If  you have any questions about your discharge medications or the care you received while you were in the hospital after you are discharged, you can call the unit and ask to speak with the hospitalist on call if the  hospitalist that took care of you is not available.  Once you are discharged, your primary care physician will handle any further medical issues. Please note that NO REFILLS for any discharge medications will be authorized once you are discharged, as it is imperative that you return to your primary care physician (or establish a relationship with a primary care physician if you do not have one) for your aftercare needs so that they can reassess your need for medications and monitor your lab values   Increase activity slowly   Complete by: As directed       Allergies as of 10/21/2023       Reactions   Hydrocodone-acetaminophen Itching   Hydromorphone Hcl Itching   Oxycodone-acetaminophen Itching        Medication List     STOP taking these medications    azithromycin 500 MG tablet Commonly known as: ZITHROMAX   levofloxacin 500 MG tablet Commonly known as: LEVAQUIN       TAKE these medications    acetaminophen 500 MG tablet Commonly known as: TYLENOL Take 1,000 mg by mouth as needed for mild pain (pain score 1-3) or moderate pain (pain score 4-6).   albuterol 108 (90 Base) MCG/ACT inhaler Commonly known as: Proventil HFA Inhale 2 puffs into the lungs every 6 (six) hours as needed for wheezing or shortness of breath.   budesonide-formoterol 160-4.5 MCG/ACT inhaler Commonly known as: Symbicort Inhale 2 puffs into the lungs in the morning and at bedtime.   guaiFENesin 600 MG 12 hr tablet Commonly known as: MUCINEX Take 1 tablet (600 mg total) by mouth 2 (two) times daily.   guaiFENesin-dextromethorphan 100-10 MG/5ML syrup Commonly known as: ROBITUSSIN DM Take 10 mLs by mouth every 4 (four) hours as needed for cough.   ibuprofen 400 MG tablet Commonly known as: ADVIL Take 1 tablet (400 mg total) by mouth every 6 (six) hours as needed for mild pain (pain score 1-3) (or Fever >/= 101).   levothyroxine 25 MCG tablet Commonly known as: SYNTHROID Take 1 tablet (25 mcg  total) by mouth daily before breakfast.   loratadine 10 MG tablet Commonly known as: CLARITIN Take 1 tablet (10 mg total) by mouth daily for 14 days. Start taking on: October 22, 2023   ondansetron 4 MG tablet Commonly known as: ZOFRAN Take 1 tablet (4 mg total) by mouth every 8 (eight) hours as needed for nausea or vomiting.   pantoprazole 40 MG tablet Commonly known as: PROTONIX Take 1 tablet (40 mg total) by mouth daily.   predniSONE 10 MG tablet Commonly known as: DELTASONE Take PO 4 tabs daily x 2 days,3 tabs daily x 2 days,2 tabs daily x 2 days,1 tab daily x 2 days then stop.        Follow-up Information     Sharon Seller, NP. Go in 15 day(s).   Specialty: Geriatric Medicine Why: Please keep new patient appointment scheduled for 11/05/23 Contact information: [address] [place] [address] [phone]                Allergies  Allergen Reactions   Hydrocodone-Acetaminophen Itching   Hydromorphone Hcl Itching   Oxycodone-Acetaminophen Itching    The results of significant diagnostics from this hospitalization (including imaging,  microbiology, ancillary and laboratory) are listed below for reference.    Microbiology: Recent Results (from the past 240 hours)  Resp panel by RT-PCR (RSV, Flu A&B, Covid) Anterior Nasal Swab     Status: None   Collection Time: 10/15/23  1:08 PM   Specimen: Anterior Nasal Swab  Result Value Ref Range Status   SARS Coronavirus 2 by RT PCR NEGATIVE NEGATIVE Final    Comment: (NOTE) SARS-CoV-2 target nucleic acids are NOT DETECTED.  The SARS-CoV-2 RNA is generally detectable in upper respiratory specimens during the acute phase of infection. The lowest concentration of SARS-CoV-2 viral copies this assay can detect is 138 copies/mL. A negative result does not preclude SARS-Cov-2 infection and should not be used as the sole basis for treatment or other patient management decisions. A negative result may occur  with  improper specimen collection/handling, submission of specimen other than nasopharyngeal swab, presence of viral mutation(s) within the areas targeted by this assay, and inadequate number of viral copies(<138 copies/mL). A negative result must be combined with clinical observations, patient history, and epidemiological information. The expected result is Negative.  Fact Sheet for Patients:  BloggerCourse.com  Fact Sheet for Healthcare Providers:  SeriousBroker.it  This test is no t yet approved or cleared by the [place] FDA and  has been authorized for detection and/or diagnosis of SARS-CoV-2 by FDA under an Emergency Use Authorization (EUA). This EUA will remain  in effect (meaning this test can be used) for the duration of the COVID-19 declaration under Section 564(b)(1) of the Act, 21 U.S.C.section 360bbb-3(b)(1), unless the authorization is terminated  or revoked sooner.       Influenza A by PCR NEGATIVE NEGATIVE Final   Influenza B by PCR NEGATIVE NEGATIVE Final    Comment: (NOTE) The Xpert Xpress SARS-CoV-2/FLU/RSV plus assay is intended as an aid in the diagnosis of influenza from Nasopharyngeal swab specimens and should not be used as a sole basis for treatment. Nasal washings and aspirates are unacceptable for Xpert Xpress SARS-CoV-2/FLU/RSV testing.  Fact Sheet for Patients: BloggerCourse.com  Fact Sheet for Healthcare Providers: SeriousBroker.it  This test is not yet approved or cleared by the [place] FDA and has been authorized for detection and/or diagnosis of SARS-CoV-2 by FDA under an Emergency Use Authorization (EUA). This EUA will remain in effect (meaning this test can be used) for the duration of the COVID-19 declaration under Section 564(b)(1) of the Act, 21 U.S.C. section 360bbb-3(b)(1), unless the authorization is terminated  or revoked.     Resp Syncytial Virus by PCR NEGATIVE NEGATIVE Final    Comment: (NOTE) Fact Sheet for Patients: BloggerCourse.com  Fact Sheet for Healthcare Providers: SeriousBroker.it  This test is not yet approved or cleared by the [place] FDA and has been authorized for detection and/or diagnosis of SARS-CoV-2 by FDA under an Emergency Use Authorization (EUA). This EUA will remain in effect (meaning this test can be used) for the duration of the COVID-19 declaration under Section 564(b)(1) of the Act, 21 U.S.C. section 360bbb-3(b)(1), unless the authorization is terminated or revoked.  Performed at [facility], [address], [place], [place] [address]   Blood culture (routine x 2)     Status: None   Collection Time: 10/15/23  7:50 PM   Specimen: BLOOD RIGHT FOREARM  Result Value Ref Range Status   Specimen Description   Final    BLOOD RIGHT FOREARM Performed at [facility], [address], [place], [place] [address]  Special Requests   Final    BOTTLES DRAWN AEROBIC AND ANAEROBIC Blood Culture results may not be optimal due to an inadequate volume of blood received in culture bottles Performed at [facility], [address] [place], [place] [address]    Culture   Final    NO GROWTH 5 DAYS Performed at [facility], [address], [place], [place] [address]    Report Status 10/20/2023 FINAL  Final  Blood culture (routine x 2)     Status: None   Collection Time: 10/15/23  8:30 PM   Specimen: BLOOD LEFT FOREARM  Result Value Ref Range Status   Specimen Description   Final    BLOOD LEFT FOREARM Performed at [facility], [address] [place], [place] [address]    Special Requests   Final    BOTTLES DRAWN AEROBIC AND ANAEROBIC Blood Culture results may not be optimal due to an inadequate volume of blood received in culture  bottles Performed at [facility], [address], [place], [place] [address]    Culture   Final    NO GROWTH 5 DAYS Performed at [facility], [address] [place], [place] [address]    Report Status 10/21/2023 FINAL  Final    Procedures/Studies: DG Chest Portable 1 View Result Date: 10/15/2023 CLINICAL DATA:  Cough, fever EXAM: PORTABLE CHEST 1 VIEW COMPARISON:  Chest radiograph 10/04/2023 FINDINGS: Stable cardiac and mediastinal contours. Interval development of patchy right upper lung and bilateral lower lung airspace opacities, right-greater-than-left. No pleural effusion or pneumothorax. Osseous structures unremarkable. IMPRESSION: Interval development of patchy right upper lung and bilateral lower lung airspace opacities, right-greater-than-left. Findings are concerning for multifocal pneumonia. Electronically Signed   By: Annia Belt M.D.   On: 10/15/2023 17:20   DG Chest 2 View Result Date: 10/04/2023 CLINICAL DATA:  Cough, shortness of breath. EXAM: CHEST - 2 VIEW COMPARISON:  September 15, 2023. FINDINGS: The heart size and mediastinal contours are within normal limits. Both lungs are clear. Hyperinflation of the lungs is noted. The visualized skeletal structures are unremarkable. IMPRESSION: No active cardiopulmonary disease.  Hyperinflation of the lungs. Electronically Signed   By: Lupita Raider M.D.   On: 10/04/2023 13:33    Labs: BNP (last 3 results) No results for input(s): "BNP" in the last 8760 hours. Basic Metabolic Panel: Recent Labs  Lab 10/15/23 2017 10/15/23 2025  NA 134* 135  K 3.5 3.6  CL 97* 96*  CO2 27  --   GLUCOSE 98 92  BUN 8 5*  CREATININE 0.47 0.60  CALCIUM 8.7*  --    Liver Function Tests: Recent Labs  Lab 10/15/23 2017  AST 21  ALT 18  ALKPHOS 76  BILITOT 0.7  PROT 7.0  ALBUMIN 3.9   No results for input(s): "LIPASE", "AMYLASE" in the last 168 hours. No results for input(s): "AMMONIA" in the last 168  hours. CBC: Recent Labs  Lab 10/15/23 2017 10/15/23 2025 10/16/23 1317  WBC 14.3*  --  10.4  NEUTROABS 11.3*  --   --   HGB 14.1 16.0* 12.2  HCT 43.8 47.0* 39.3  MCV 100.2*  --  102.3*  PLT 294  --  272   Cardiac Enzymes: No results for input(s): "CKTOTAL", "CKMB", "CKMBINDEX", "TROPONINI" in the last 168 hours. BNP: Invalid input(s): "POCBNP" CBG: No results for input(s): "GLUCAP" in the last 168 hours. D-Dimer No results for input(s): "DDIMER" in the last  72 hours. Hgb A1c No results for input(s): "HGBA1C" in the last 72 hours. Lipid Profile No results for input(s): "CHOL", "HDL", "LDLCALC", "TRIG", "CHOLHDL", "LDLDIRECT" in the last 72 hours. Thyroid function studies No results for input(s): "TSH", "T4TOTAL", "T3FREE", "THYROIDAB" in the last 72 hours.  Invalid input(s): "FREET3" Anemia work up No results for input(s): "VITAMINB12", "FOLATE", "FERRITIN", "TIBC", "IRON", "RETICCTPCT" in the last 72 hours. Urinalysis    Component Value Date/Time   COLORURINE YELLOW 08/01/2023 2112   APPEARANCEUR HAZY (A) 08/01/2023 2112   LABSPEC 1.025 08/01/2023 2112   PHURINE 5.0 08/01/2023 2112   GLUCOSEU NEGATIVE 08/01/2023 2112   HGBUR NEGATIVE 08/01/2023 2112   BILIRUBINUR NEGATIVE 08/01/2023 2112   KETONESUR NEGATIVE 08/01/2023 2112   PROTEINUR NEGATIVE 08/01/2023 2112   UROBILINOGEN 1.0 03/19/2010 1431   NITRITE POSITIVE (A) 08/01/2023 2112   LEUKOCYTESUR NEGATIVE 08/01/2023 2112   Sepsis Labs Recent Labs  Lab 10/15/23 2017 10/16/23 1317  WBC 14.3* 10.4   Microbiology Recent Results (from the past 240 hours)  Resp panel by RT-PCR (RSV, Flu A&B, Covid) Anterior Nasal Swab     Status: None   Collection Time: 10/15/23  1:08 PM   Specimen: Anterior Nasal Swab  Result Value Ref Range Status   SARS Coronavirus 2 by RT PCR NEGATIVE NEGATIVE Final    Comment: (NOTE) SARS-CoV-2 target nucleic acids are NOT DETECTED.  The SARS-CoV-2 RNA is generally detectable in upper  respiratory specimens during the acute phase of infection. The lowest concentration of SARS-CoV-2 viral copies this assay can detect is 138 copies/mL. A negative result does not preclude SARS-Cov-2 infection and should not be used as the sole basis for treatment or other patient management decisions. A negative result may occur with  improper specimen collection/handling, submission of specimen other than nasopharyngeal swab, presence of viral mutation(s) within the areas targeted by this assay, and inadequate number of viral copies(<138 copies/mL). A negative result must be combined with clinical observations, patient history, and epidemiological information. The expected result is Negative.  Fact Sheet for Patients:  BloggerCourse.com  Fact Sheet for Healthcare Providers:  SeriousBroker.it  This test is no t yet approved or cleared by the [place] FDA and  has been authorized for detection and/or diagnosis of SARS-CoV-2 by FDA under an Emergency Use Authorization (EUA). This EUA will remain  in effect (meaning this test can be used) for the duration of the COVID-19 declaration under Section 564(b)(1) of the Act, 21 U.S.C.section 360bbb-3(b)(1), unless the authorization is terminated  or revoked sooner.       Influenza A by PCR NEGATIVE NEGATIVE Final   Influenza B by PCR NEGATIVE NEGATIVE Final    Comment: (NOTE) The Xpert Xpress SARS-CoV-2/FLU/RSV plus assay is intended as an aid in the diagnosis of influenza from Nasopharyngeal swab specimens and should not be used as a sole basis for treatment. Nasal washings and aspirates are unacceptable for Xpert Xpress SARS-CoV-2/FLU/RSV testing.  Fact Sheet for Patients: BloggerCourse.com  Fact Sheet for Healthcare Providers: SeriousBroker.it  This test is not yet approved or cleared by the [place] FDA and has been  authorized for detection and/or diagnosis of SARS-CoV-2 by FDA under an Emergency Use Authorization (EUA). This EUA will remain in effect (meaning this test can be used) for the duration of the COVID-19 declaration under Section 564(b)(1) of the Act, 21 U.S.C. section 360bbb-3(b)(1), unless the authorization is terminated or revoked.     Resp Syncytial Virus by PCR NEGATIVE NEGATIVE Final  Comment: (NOTE) Fact Sheet for Patients: BloggerCourse.com  Fact Sheet for Healthcare Providers: SeriousBroker.it  This test is not yet approved or cleared by the [place] FDA and has been authorized for detection and/or diagnosis of SARS-CoV-2 by FDA under an Emergency Use Authorization (EUA). This EUA will remain in effect (meaning this test can be used) for the duration of the COVID-19 declaration under Section 564(b)(1) of the Act, 21 U.S.C. section 360bbb-3(b)(1), unless the authorization is terminated or revoked.  Performed at [facility], [address] [place], [place] [address]   Blood culture (routine x 2)     Status: None   Collection Time: 10/15/23  7:50 PM   Specimen: BLOOD RIGHT FOREARM  Result Value Ref Range Status   Specimen Description   Final    BLOOD RIGHT FOREARM Performed at [facility], [address], [place], [place] [address]    Special Requests   Final    BOTTLES DRAWN AEROBIC AND ANAEROBIC Blood Culture results may not be optimal due to an inadequate volume of blood received in culture bottles Performed at [facility], [address] [place], [place] [address]    Culture   Final    NO GROWTH 5 DAYS Performed at [facility], [address] [place], [place] [address]    Report Status 10/20/2023 FINAL  Final  Blood culture (routine x 2)     Status: None   Collection Time: 10/15/23  8:30 PM   Specimen: BLOOD LEFT FOREARM  Result Value  Ref Range Status   Specimen Description   Final    BLOOD LEFT FOREARM Performed at [facility], [address], [place], [place] [address]    Special Requests   Final    BOTTLES DRAWN AEROBIC AND ANAEROBIC Blood Culture results may not be optimal due to an inadequate volume of blood received in culture bottles Performed at [facility], [address] [place], [place] [address]    Culture   Final    NO GROWTH 5 DAYS Performed at [facility], [address], [place], [place] [address]    Report Status 10/21/2023 FINAL  Final   Time coordinating discharge: 35 minutes  SIGNED: Lanae Boast, MD  [facility] 10/21/2023, 1:03 PM  If 7PM-7AM, please contact night-coverage [email or web address]

## 2023-10-21 NOTE — Plan of Care (Signed)

## 2023-10-21 NOTE — TOC Transition Note (Signed)
 Transition of Care [facility]) - Discharge Note   Patient Details  Name: Stacy Bolton MRN: [id number] Date of Birth: [birth year]  Transition of Care [facility]) CM/SW Contact:  Diona Browner, LCSW Phone Number: 10/21/2023, 2:39 PM   Clinical Narrative:    Pt medically ready to d/c back to [facility]. CSW called and spoke with shelter CM Jayhana to confirm that pt will be able to return. CM requested that pt arrive to shelter prior to 7pm. CSW assisted with taxi voucher and left in pt chart at nurses station. Pt scheduled to leave hospital at 4pm.    Final next level of care: Homeless Shelter [facility]) Barriers to Discharge: Barriers Resolved   Patient Goals and CMS Choice Patient states their goals for this hospitalization and ongoing recovery are:: return to shelter CMS Medicare.gov Compare Post Acute Care list provided to:: Patient        Discharge Placement                       Discharge Plan and Services Additional resources added to the After Visit Summary for     Discharge Planning Services: CM Consult              DME Agency: NA                  Social Drivers of Health (SDOH) Interventions SDOH Screenings   Food Insecurity: Food Insecurity Present (10/17/2023)  Housing: High Risk (10/17/2023)  Transportation Needs: Unmet Transportation Needs (10/17/2023)  Utilities: Not At Risk (10/17/2023)  Recent Concern: Utilities - At Risk (10/16/2023)  Alcohol Screen: Low Risk  (01/31/2023)  Depression (PHQ2-9): High Risk (10/02/2023)  Financial Resource Strain: High Risk (01/16/2023)   [facility] from [facility] [place] [facility]  Physical Activity: Sufficiently Active (01/16/2023)   Received from [facility]  Social Connections: Socially Isolated (01/16/2023)   Received from [facility]  Tobacco Use: High Risk (10/15/2023)  Health Literacy: Low Risk  (01/16/2023)   [facility] from [facility]     Readmission Risk Interventions    10/17/2023    2:34 PM 09/16/2023   10:20 AM 08/07/2023    1:43 PM  Readmission Risk Prevention Plan  Transportation Screening Complete Complete Complete  Medication Review (RN Care Manager) Complete Complete Complete  PCP or Specialist appointment within 3-5 days of discharge Complete  Complete  HRI or Home Care Consult Complete Complete   SW Recovery Care/Counseling Consult Complete Complete Complete  Palliative Care Screening Not Applicable Not Applicable Not Applicable  Skilled Nursing Facility Not Applicable Not Applicable Not Applicable

## 2023-10-21 NOTE — Progress Notes (Signed)
 AVS reviewed w/ pt who verbalized an understanding - pt to obtain robitussin DM OTC -Pt dressed for d/c to home.

## 2023-10-22 ENCOUNTER — Other Ambulatory Visit (HOSPITAL_COMMUNITY): Payer: Self-pay

## 2023-10-22 MED ORDER — ALBUTEROL SULFATE HFA 108 (90 BASE) MCG/ACT IN AERS
2.0000 | INHALATION_SPRAY | Freq: Four times a day (QID) | RESPIRATORY_TRACT | 0 refills | Status: DC | PRN
Start: 2023-08-17 — End: 2024-04-24
  Filled 2023-10-22: qty 18, 25d supply, fill #0

## 2023-10-22 NOTE — Congregational Nurse Program (Signed)
  Dept: [phone]   Congregational Nurse Program Note  Date of Encounter: 10/22/2023  Clinic visit post hospital d/c on 10/21/2023 for bilateral pneumonia.  Continues to have a productive cough of thick white/clear sputum, no fever or chills, no wheezing or chest pain noted.  BP 115/77, pulse 98 and regular, respirations 16, temperature 98.3, O2 Sat 95%.  Reviewed d/c summary and new medications with resident.  Determined that she didn't have albuterol inhaler or Robitussin DM that were ordered. Called [facility] outpatient pharmacy and arranged to have prescriptions filled and pick up today.  Past Medical History: Past Medical History:  Diagnosis Date   Anxiety    Asthma    COPD (chronic obstructive pulmonary disease) (HCC)    Dyspnea    Endometriosis    Seizure [facility])     Encounter Details:  Community Questionnaire - 10/22/23 0950       Questionnaire   Ask client: Do you give verbal consent for me to treat you today? Yes    Student Assistance N/A    Location Patient Served  [facility]    Encounter Setting CN site    Population Status Unhoused    Insurance Medicaid    Insurance/Financial Assistance Referral N/A    Medication Have Medication Insecurities;Patient Medications Reviewed;Provided Medication Assistance    Medical Provider Yes    Screening Referrals Made N/A    Medical Referrals Made N/A    Medical Appointment Completed ED    CNP Interventions Advocate/Support;Counsel;Case Management;Reviewed New Diagnosis    Screenings CN Performed Blood Pressure    ED Visit Averted N/A    Life-Saving Intervention Made N/A

## 2023-10-24 ENCOUNTER — Encounter: Payer: Self-pay | Admitting: *Deleted

## 2023-10-24 ENCOUNTER — Encounter: Payer: Self-pay | Admitting: Physician Assistant

## 2023-10-24 NOTE — Progress Notes (Signed)
 Pt SOB after walking in from the front.   She took several deep breaths and felt better.  She is still having sig problems w/ her wrist, but some aspects are better.  She feels stressed and started crying, but was able to relax a bit.   Hospitalized 2/18-2/24 for pneumonia.   Meds reviewed.  Has 2 alb inh, different appearance. She feels one works better, was shown that the active ingredient is the same in both.  Has Symbicort inh, prescribed in Dec, but still w/ some doses left. Encouraged to use it twice a day every day.  On steroid taper, has completed ABX.  Has a rollator, but it came out of the storage closet. She has several complaints about it, wants a new one.   Dr Delford Field will be asked about it.      10/24/2023    9:56 AM 10/22/2023   10:00 AM 10/21/2023   12:08 PM  Vitals with BMI  Systolic 124 115 409  Diastolic 87 77 86  Pulse 100 98 109     Ardena Gangl, PA-C 10/24/2023 11:10 AM

## 2023-10-24 NOTE — Congregational Nurse Program (Signed)
  Dept: [phone]   Congregational Nurse Program Note  Date of Encounter: 10/24/2023  Past Medical History: Past Medical History:  Diagnosis Date   Anxiety    Asthma    COPD (chronic obstructive pulmonary disease) (HCC)    Dyspnea    Endometriosis    Seizure (HCC)     Encounter Details:  Community Questionnaire - 10/24/23 1000       Questionnaire   Ask client: Do you give verbal consent for me to treat you today? Yes    Student Assistance [employer] Nurse    Location Patient Served  [facility]    Encounter Setting CN site    Population Status Unhoused    Insurance Medicaid    Insurance/Financial Assistance Referral N/A    Medication N/A    Medical Provider Yes    Screening Referrals Made N/A    Medical Referrals Made [facility] PCP/Clinic    Medical Appointment Completed N/A    CNP Interventions Advocate/Support    Screenings CN Performed Blood Pressure    ED Visit Averted N/A    Life-Saving Intervention Made N/A           Client signed form to see MD at [facility] clinic. Completed triage form. Blood pressure 124/87, pulse 100, temperature 99.6 F (37.6 C), temperature source Oral, last menstrual period 10/13/2011, SpO2 97%.  Eh Sesay W RN CN

## 2023-11-08 ENCOUNTER — Encounter: Payer: Self-pay | Admitting: Nurse Practitioner

## 2023-11-08 ENCOUNTER — Other Ambulatory Visit (HOSPITAL_COMMUNITY): Payer: Self-pay

## 2023-11-08 ENCOUNTER — Ambulatory Visit (INDEPENDENT_AMBULATORY_CARE_PROVIDER_SITE_OTHER): Payer: Medicaid Other | Admitting: Nurse Practitioner

## 2023-11-08 VITALS — BP 100/60 | HR 115 | Temp 98.1°F | Resp 20 | Ht 65.0 in | Wt 109.2 lb

## 2023-11-08 DIAGNOSIS — F1910 Other psychoactive substance abuse, uncomplicated: Secondary | ICD-10-CM

## 2023-11-08 DIAGNOSIS — J302 Other seasonal allergic rhinitis: Secondary | ICD-10-CM | POA: Diagnosis not present

## 2023-11-08 DIAGNOSIS — F419 Anxiety disorder, unspecified: Secondary | ICD-10-CM

## 2023-11-08 DIAGNOSIS — M1990 Unspecified osteoarthritis, unspecified site: Secondary | ICD-10-CM

## 2023-11-08 DIAGNOSIS — E039 Hypothyroidism, unspecified: Secondary | ICD-10-CM

## 2023-11-08 DIAGNOSIS — F1010 Alcohol abuse, uncomplicated: Secondary | ICD-10-CM

## 2023-11-08 DIAGNOSIS — K219 Gastro-esophageal reflux disease without esophagitis: Secondary | ICD-10-CM

## 2023-11-08 DIAGNOSIS — J449 Chronic obstructive pulmonary disease, unspecified: Secondary | ICD-10-CM

## 2023-11-08 DIAGNOSIS — Z5900 Homelessness unspecified: Secondary | ICD-10-CM

## 2023-11-08 DIAGNOSIS — R198 Other specified symptoms and signs involving the digestive system and abdomen: Secondary | ICD-10-CM

## 2023-11-08 DIAGNOSIS — R739 Hyperglycemia, unspecified: Secondary | ICD-10-CM

## 2023-11-08 MED ORDER — LORATADINE 10 MG PO TABS
10.0000 mg | ORAL_TABLET | Freq: Every day | ORAL | 1 refills | Status: DC
  Filled 2023-11-08 – 2023-11-19 (×2): qty 30, 30d supply, fill #0
  Filled 2024-01-09: qty 30, 30d supply, fill #1

## 2023-11-08 MED ORDER — PANTOPRAZOLE SODIUM 40 MG PO TBEC
40.0000 mg | DELAYED_RELEASE_TABLET | Freq: Every day | ORAL | 0 refills | Status: DC
  Filled 2023-11-08 – 2023-11-19 (×2): qty 30, 30d supply, fill #0
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
## 2023-11-09 LAB — COMPLETE METABOLIC PANEL WITH GFR
AG Ratio: 1.9 (calc) (ref 1.0–2.5)
ALT: 80 U/L — ABNORMAL HIGH (ref 6–29)
AST: 54 U/L — ABNORMAL HIGH (ref 10–35)
Albumin: 4.4 g/dL (ref 3.6–5.1)
Alkaline phosphatase (APISO): 94 U/L (ref 37–153)
BUN: 8 mg/dL (ref 7–25)
CO2: 31 mmol/L (ref 20–32)
Calcium: 10 mg/dL (ref 8.6–10.4)
Chloride: 102 mmol/L (ref 98–110)
Creat: 0.52 mg/dL (ref 0.50–1.03)
Globulin: 2.3 g/dL (ref 1.9–3.7)
Glucose, Bld: 93 mg/dL (ref 65–139)
Potassium: 4.2 mmol/L (ref 3.5–5.3)
Sodium: 141 mmol/L (ref 135–146)
Total Bilirubin: 0.7 mg/dL (ref 0.2–1.2)
Total Protein: 6.7 g/dL (ref 6.1–8.1)
eGFR: 110 mL/min/{1.73_m2} (ref >=60)

## 2023-11-09 LAB — CBC WITH DIFFERENTIAL/PLATELET
Absolute Lymphocytes: 1661 {cells}/uL (ref 850–3900)
Absolute Monocytes: 507 {cells}/uL (ref 200–950)
Basophils Absolute: 39 {cells}/uL (ref 0–200)
Basophils Relative: 0.5 %
Eosinophils Absolute: 62 {cells}/uL (ref 15–500)
Eosinophils Relative: 0.8 %
HCT: 44.8 % (ref 35.0–45.0)
Hemoglobin: 15.1 g/dL (ref 11.7–15.5)
MCH: 32.4 pg (ref 27.0–33.0)
MCHC: 33.7 g/dL (ref 32.0–36.0)
MCV: 96.1 fL (ref 80.0–100.0)
MPV: 10 fL (ref 7.5–12.5)
Monocytes Relative: 6.5 %
Neutro Abs: 5530 {cells}/uL (ref 1500–7800)
Neutrophils Relative %: 70.9 %
Platelets: 339 10*3/uL (ref 140–400)
RBC: 4.66 10*6/uL (ref 3.80–5.10)
RDW: 13.5 % (ref 11.0–15.0)
Total Lymphocyte: 21.3 %
WBC: 7.8 10*3/uL (ref 3.8–10.8)

## 2023-11-09 LAB — HEMOGLOBIN A1C
Hgb A1c MFr Bld: 5.5 %{Hb} (ref <5.7)
Mean Plasma Glucose: 111 mg/dL
eAG (mmol/L): 6.2 mmol/L

## 2023-11-09 LAB — VITAMIN B12: Vitamin B-12: 581 pg/mL (ref 200–1100)

## 2023-11-14 ENCOUNTER — Encounter: Payer: Self-pay | Admitting: Licensed Clinical Social Worker

## 2023-11-14 ENCOUNTER — Telehealth: Payer: Self-pay

## 2023-11-14 NOTE — Patient Outreach (Signed)
 Care Coordination   Collaboration  Visit Note   11/14/2023 Name: Stacy Bolton MRN: [id number] DOB: Dec 28, 1970  Stacy Bolton is a 53 y.o. year old female who sees Eubanks, Janene Harvey, NP for primary care. I  spoke with Care Guide regarding patient care needs  What matters to the patients health and wellness today?  Patient was not engaged during this encounter     SDOH assessments and interventions completed:  No     Care Coordination Interventions:  Yes, provided   Follow up plan: LCSW is scheduled with patient on 3/25   Encounter Outcome:  Patient Visit Completed   Jenel Lucks, LCSW [place]  [facility], [facility] Clinical Social Worker Direct Dial: [phone]  Fax: [phone] Website: [email or web address] 12:16 PM
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
## 2023-11-19 ENCOUNTER — Other Ambulatory Visit (HOSPITAL_COMMUNITY): Payer: Self-pay

## 2023-11-19 ENCOUNTER — Ambulatory Visit: Payer: Self-pay | Admitting: Licensed Clinical Social Worker

## 2023-11-19 NOTE — Patient Outreach (Signed)
 Care Coordination   Initial Visit Note   11/19/2023 Name: Stacy Bolton MRN: [id number] DOB: [birth year]  [name] is a 53 y.o. year old female who sees Eubanks, Janene Harvey, NP for primary care. I spoke with  [name] by phone today.  What matters to the patients health and wellness today?  Community Resources, Symptom Management    Goals Addressed             This Visit's Progress    Obtain Supportive Resources-Symptom Management   On track    Activities and task to complete in order to accomplish goals.   Keep all upcoming appointments discussed today Continue with compliance of taking medication prescribed by Doctor Implement healthy coping skills discussed to assist with management of symptoms Continue working with [facility] care team to assist with goals identified Call [facility] to discuss Medicare programs Call [facility] Customer Service to inquire about program benefits         SDOH assessments and interventions completed:  Yes  SDOH Interventions Today    Flowsheet Row Most Recent Value  SDOH Interventions   Food Insecurity Interventions Community Resources Provided, Other (Comment)  [Pt reports she is not eligible for DSS/SNAP benefits due to prior hx]  Housing Interventions Community Resources Provided  [employer] Ending Homelessness]  Transportation Interventions [employer] Provided, Patient Resources (Friends/Family), Payor Benefit  Utilities Interventions Intervention Not Indicated  [Pt staying with a friend]        Care Coordination Interventions:  Yes, provided  Interventions Today    Flowsheet Row Most Recent Value  Chronic Disease   Chronic disease during today's visit Chronic Obstructive Pulmonary Disease (COPD), Other  [Bipolar I Disorder, GAD, Polysubstance Use]  General Interventions   General Interventions Discussed/Reviewed General Interventions Discussed, Programmer, applications, Doctor Visits  [LCSW provided pt  with contact info for [employer] to assist with Medicare benefits]  Doctor Visits Discussed/Reviewed Doctor Visits Discussed  Mental Health Interventions   Mental Health Discussed/Reviewed Mental Health Discussed, Coping Strategies, Anxiety, Depression, Crisis, Substance Abuse  [LCSW discussed strategies to assist with strengthening support system and community partnerships. Pt associated with [employer]. LCSW provided info for [employer] Ending Homelessness. Healthy coping skills discussed]  Nutrition Interventions   Nutrition Discussed/Reviewed Nutrition Discussed  [Pt is aware of local food pantries]  Pharmacy Interventions   Pharmacy Dicussed/Reviewed Pharmacy Topics Discussed, Medication Adherence  Medication Adherence Unable to refill medication  [Patient will contact insurance for transportation to pharmacy]  Safety Interventions   Safety Discussed/Reviewed Safety Discussed       Follow up plan: Follow up call scheduled for 1-2 weeks    Encounter Outcome:  Patient Visit Completed   Jenel Lucks, LCSW [place]  [facility], [facility] Clinical Social Worker Direct Dial: [phone]  Fax: [phone] Website: [email or web address] 9:41 AM

## 2023-11-19 NOTE — Patient Instructions (Signed)
 Visit Information  Thank you for taking time to visit with me today. Please don't hesitate to contact me if I can be of assistance to you.   Following are the goals we discussed today:   Goals Addressed             This Visit's Progress    Obtain Supportive Resources-Symptom Management   On track    Activities and task to complete in order to accomplish goals.   Keep all upcoming appointments discussed today Continue with compliance of taking medication prescribed by Doctor Implement healthy coping skills discussed to assist with management of symptoms Continue working with [facility] care team to assist with goals identified Call [facility] to discuss Medicare programs Call [facility] Customer Service to inquire about program benefits         Our next appointment is by telephone on 4/2 at 10:30 AM  Please call the care guide team at [phone] if you need to cancel or reschedule your appointment.   If you are experiencing a Mental Health or Behavioral Health Crisis or need someone to talk to, please call the Suicide and Crisis Lifeline: 988 call 911   The patient verbalized understanding of instructions, educational materials, and care plan provided today and DECLINED offer to receive copy of patient instructions, educational materials, and care plan.   [facility]  [facility], [place] [place] Clinical Social Worker Direct Dial: [phone]  Fax: [phone] Website: [email or web address] 9:45 AM

## 2023-11-21 ENCOUNTER — Other Ambulatory Visit (HOSPITAL_COMMUNITY): Payer: Self-pay

## 2023-11-25 ENCOUNTER — Ambulatory Visit: Admitting: Nurse Practitioner

## 2023-11-27 ENCOUNTER — Ambulatory Visit: Payer: Self-pay | Admitting: Licensed Clinical Social Worker

## 2023-11-27 NOTE — Patient Outreach (Signed)
 Care Coordination   Follow Up Visit Note   11/27/2023 Name: Stacy Bolton MRN: [id number] DOB: 1971-01-20  Stacy Bolton is a 53 y.o. year old female who sees Eubanks, Janene Harvey, NP for primary care. I spoke with  Stacy Bolton by phone today.  What matters to the patients health and wellness today?  Housing and Symptom Management    Goals Addressed             This Visit's Progress    Obtain Supportive Resources-Symptom Management   On track    Activities and task to complete in order to accomplish goals.   Keep all upcoming appointments discussed today Continue with compliance of taking medication prescribed by Doctor Implement healthy coping skills discussed to assist with management of symptoms Continue working with [facility] care team to assist with goals identified Call [facility] to discuss Medicare programs Call [facility] Customer Service to inquire about program benefits F/up with [facility] to strengthen support with obtaining safe housing         SDOH assessments and interventions completed:  No     Care Coordination Interventions:  Yes, provided  Interventions Today    Flowsheet Row Most Recent Value  Chronic Disease   Chronic disease during today's visit Chronic Obstructive Pulmonary Disease (COPD)  [Bipolar I Disorder, GAD, Housing Instability]  General Interventions   General Interventions Discussed/Reviewed General Interventions Reviewed, Programmer, applications, Communication with  Communication with Social Work  [employer] collaborated with [facility] to assist with strengthening pt's support with obtaining safe housing]  Mental Health Interventions   Mental Health Discussed/Reviewed Mental Health Reviewed, Coping Strategies       Follow up plan: Follow up call scheduled for 2-4 weeks    Encounter Outcome:  Patient Visit Completed   Jenel Lucks, LCSW [place]  [facility], [facility] Clinical Social Worker Direct Dial: [phone]  Fax: [phone] Website: [email or web address] 10:41 AM

## 2023-11-27 NOTE — Patient Instructions (Signed)
 Visit Information  Thank you for taking time to visit with me today. Please don't hesitate to contact me if I can be of assistance to you.   Following are the goals we discussed today:   Goals Addressed             This Visit's Progress    Obtain Supportive Resources-Symptom Management   On track    Activities and task to complete in order to accomplish goals.   Keep all upcoming appointments discussed today Continue with compliance of taking medication prescribed by Doctor Implement healthy coping skills discussed to assist with management of symptoms Continue working with [facility] care team to assist with goals identified Call [facility] to discuss Medicare programs Call [facility] Customer Service to inquire about program benefits F/up with [facility] to strengthen support with obtaining safe housing         Our next appointment is by telephone on 4/30 at 10:30 AM  Please call the care guide team at [phone] if you need to cancel or reschedule your appointment.   If you are experiencing a Mental Health or Behavioral Health Crisis or need someone to talk to, please call the Suicide and Crisis Lifeline: 988 call 911   The patient verbalized understanding of instructions, educational materials, and care plan provided today and DECLINED offer to receive copy of patient instructions, educational materials, and care plan.   [facility]  [facility], [facility] Clinical Social Worker Direct Dial: [phone]  Fax: [phone] Website: [email or web address] 10:43 AM

## 2023-12-06 ENCOUNTER — Telehealth: Payer: Self-pay

## 2023-12-06 DIAGNOSIS — N3946 Mixed incontinence: Secondary | ICD-10-CM

## 2023-12-06 NOTE — Telephone Encounter (Signed)
 Order printed, please call patient and see where she would like [place] to fax

## 2023-12-06 NOTE — Telephone Encounter (Signed)
Message routed to PCP Eubanks, Jessica K, NP  

## 2023-12-06 NOTE — Telephone Encounter (Signed)
 Copied from CRM [id number]. Topic: General - Other >> Dec 06, 2023 11:28 AM Irine Seal wrote: Reason for CRM: Patient saw an ad for free incontinence supplies through [facility]. She was informed a prescription is required for the supplies to be delivered. Patient would like a callback to help guide her through the next steps for obtaining a prescription and moving forward with the process.

## 2023-12-09 NOTE — Telephone Encounter (Signed)
 I have a fax from the company "[employer]". I called patient to let her know I would fax paper to them. She didn't answer the phone and Im unable to leave a voicemail because she hasn't set it up. I will make another attempt to call patient later.

## 2023-12-16 ENCOUNTER — Other Ambulatory Visit (HOSPITAL_COMMUNITY): Payer: Self-pay

## 2023-12-16 ENCOUNTER — Telehealth: Payer: Self-pay | Admitting: *Deleted

## 2023-12-16 ENCOUNTER — Ambulatory Visit: Admitting: Nurse Practitioner

## 2023-12-16 VITALS — BP 124/88 | HR 80 | Temp 97.2°F | Ht 65.0 in | Wt 106.0 lb

## 2023-12-16 DIAGNOSIS — M1990 Unspecified osteoarthritis, unspecified site: Secondary | ICD-10-CM

## 2023-12-16 DIAGNOSIS — Z7251 High risk heterosexual behavior: Secondary | ICD-10-CM | POA: Diagnosis not present

## 2023-12-16 DIAGNOSIS — R748 Abnormal levels of other serum enzymes: Secondary | ICD-10-CM

## 2023-12-16 DIAGNOSIS — Z5941 Food insecurity: Secondary | ICD-10-CM

## 2023-12-16 DIAGNOSIS — J449 Chronic obstructive pulmonary disease, unspecified: Secondary | ICD-10-CM

## 2023-12-16 DIAGNOSIS — N3946 Mixed incontinence: Secondary | ICD-10-CM

## 2023-12-16 DIAGNOSIS — F419 Anxiety disorder, unspecified: Secondary | ICD-10-CM | POA: Diagnosis not present

## 2023-12-16 DIAGNOSIS — F1010 Alcohol abuse, uncomplicated: Secondary | ICD-10-CM

## 2023-12-16 DIAGNOSIS — Z5900 Homelessness unspecified: Secondary | ICD-10-CM

## 2023-12-16 DIAGNOSIS — K623 Rectal prolapse: Secondary | ICD-10-CM

## 2023-12-16 MED ORDER — ACETAMINOPHEN 500 MG PO TABS
1000.0000 mg | ORAL_TABLET | Freq: Three times a day (TID) | ORAL | 0 refills | Status: DC | PRN
  Filled 2023-12-16: qty 90, 15d supply, fill #0

## 2023-12-16 MED ORDER — SERTRALINE HCL 50 MG PO TABS
ORAL_TABLET | ORAL | 2 refills | Status: DC
  Filled 2023-12-16: qty 30, 33d supply, fill #0

## 2023-12-16 NOTE — Telephone Encounter (Signed)
 Received fax from [employer] #[phone] Requesting Incontinence Supplies.  Requesting 200 Adult Medium Pull on Underwear.  Policy #[id number]  Placed form in Jessica's folder to review and sign.  To be faxed back to Fax: [phone]

## 2023-12-16 NOTE — Patient Instructions (Addendum)
 1.) Visit your local pharmacy to update your shingrix vaccine   [facility] Address: [address] [place], [place] [address] Phone: [phone] To call and make appt.   [facility] Phone: [phone] To call and make appt.    Metamucil daily to help bulk stools If you still have constipation take  Take miralax  17 gm  half dose- every other/daily for constipation

## 2023-12-16 NOTE — Progress Notes (Signed)
 Careteam: Patient Care Team: Verma Gobble, NP as PCP - General (Geriatric Medicine) Adriana Albany, LCSW as VBCI Care Management (Licensed Clinical Social Worker) Adriana Albany, LCSW (Licensed Clinical Social Worker)  PLACE OF SERVICE:  [facility]  Advanced Directive information    Allergies  Allergen Reactions   Hydrocodone -Acetaminophen  Itching   Hydromorphone Hcl Itching   Oxycodone -Acetaminophen  Itching    Chief Complaint  Patient presents with   Medical Management of Chronic Issues    Routine visit. Discussed need for covid booster(declined), td/tdap (declined), shingrix (would like to get at local pharmacy), cervical cancer screening (request GYN), colonoscopy(decline), and mammogram (declined)    HPI:  Discussed the use of AI scribe software for clinical note transcription with the patient, who gave verbal consent to proceed.  History of Present Illness   Stacy Bolton is a 53 year old female who presents with anxiety, depression, and concerns about a possible sexually transmitted infection.  She experiences severe anxiety and depression, feeling overwhelmed and stressed due to her living situation and lack of support. She has a history of substance use, including cocaine use two days ago, and alcohol consumption, which affects her mental health and liver function.  She is concerned about a possible sexually transmitted infection following a recent non-consensual sexual encounter. She describes a 'fishy odor' and is worried about the possibility of an infection, noting she has not experienced this odor since she was young.  She is currently homeless and staying with an elderly man, which she finds stressful due to temperature disagreements and lack of personal space. She has no stable source of food or income and relies on occasional help from others for food and basic needs. She mentions losing weight rapidly and feeling constantly hungry.  She  has a history of rectal prolapse, causing significant discomfort and bleeding. She reports difficulty with bowel movements, alternating between constipation and diarrhea, and describes her bottom 'dropping out' and causing pain. She has missed three scheduled surgeries for this condition due to various health and personal issues.  She experiences chronic pain in her hips and knees, describing it as 'unbearable' at times. She uses Tylenol  sparingly due to concerns about liver health. She also has a broken wrist, which limits her ability to perform daily tasks and contributes to her overall stress.  She has a history of COPD and uses an inhaler, although she continues to smoke occasionally. She is aware that smoking exacerbates her condition but finds it difficult to quit.      Review of Systems:  Review of Systems  Constitutional:  Negative for chills, fever and weight loss.  HENT:  Negative for tinnitus.   Respiratory:  Positive for cough. Negative for sputum production and shortness of breath.   Cardiovascular:  Negative for chest pain, palpitations and leg swelling.  Gastrointestinal:  Negative for abdominal pain, constipation, diarrhea and heartburn.  Genitourinary:  Negative for dysuria, frequency and urgency.       Incontinence of urine  Musculoskeletal:  Positive for joint pain and myalgias. Negative for back pain and falls.  Skin: Negative.   Neurological:  Negative for dizziness and headaches.  Psychiatric/Behavioral:  Positive for memory loss and substance abuse. Negative for depression. The patient is nervous/anxious. The patient does not have insomnia.     Past Medical History:  Diagnosis Date   Anxiety    Asthma    COPD (chronic obstructive pulmonary disease) (HCC)    Dyspnea    Endometriosis  Seizure [facility])    Past Surgical History:  Procedure Laterality Date   CHOLECYSTECTOMY     Social History:   reports that she has been smoking cigarettes. She has a 45 pack-year  smoking history. She does not have any smokeless tobacco history on file. She reports current alcohol use. She reports current drug use. Drugs: Cocaine and Marijuana.  Family History  Problem Relation Age of Onset   Cancer Father        stomach and colon cancer    Medications: Patient's Medications  New Prescriptions   SERTRALINE  (ZOLOFT ) 50 MG TABLET    Take 0.5 tablets (25 mg total) by mouth daily for 7 days, THEN 1 tablet (50 mg total) daily  Previous Medications   ALBUTEROL  (VENTOLIN  HFA) 108 (90 BASE) MCG/ACT INHALER    Inhale 2 puffs into the lungs every 6 (six) hours as needed for wheezing or shortness of breath.   BUDESONIDE -FORMOTEROL  (SYMBICORT ) 160-4.5 MCG/ACT INHALER    Inhale 2 puffs into the lungs in the morning and at bedtime.   FLUTICASONE  (FLONASE ) 50 MCG/ACT NASAL SPRAY    Place 1 spray into both nostrils daily.   GUAIFENESIN  (MUCINEX ) 600 MG 12 HR TABLET    Take 1 tablet (600 mg total) by mouth 2 (two) times daily.   LEVOTHYROXINE  (SYNTHROID ) 25 MCG TABLET    Take 1 tablet (25 mcg total) by mouth daily before breakfast.   LORATADINE  (CLARITIN ) 10 MG TABLET    Take 1 tablet (10 mg total) by mouth daily.   PANTOPRAZOLE  (PROTONIX ) 40 MG TABLET    Take 1 tablet (40 mg total) by mouth daily.  Modified Medications   Modified Medication Previous Medication   ACETAMINOPHEN  (TYLENOL ) 500 MG TABLET acetaminophen  (TYLENOL ) 500 MG tablet      Take 2 tablets (1,000 mg total) by mouth every 8 (eight) hours as needed.    Take 1,000 mg by mouth as needed for mild pain (pain score 1-3) or moderate pain (pain score 4-6).  Discontinued Medications   GUAIFENESIN -DEXTROMETHORPHAN  (ROBITUSSIN DM) 100-10 MG/5ML SYRUP    Take 10 mLs by mouth every 4 (four) hours as needed for cough.    Physical Exam:  Vitals:   12/16/23 1404  BP: 124/88  Pulse: 80  Temp: (!) 97.2 F (36.2 C)  TempSrc: Temporal  SpO2: 96%  Weight: 106 lb (48.1 kg)  Height: 5\' 5"  (1.651 m)   Body mass index is 17.64  kg/m. Wt Readings from Last 3 Encounters:  12/16/23 106 lb (48.1 kg)  11/08/23 109 lb 3.2 oz (49.5 kg)  10/15/23 105 lb (47.6 kg)    Physical Exam Constitutional:      General: She is not in acute distress.    Appearance: She is well-developed. She is not diaphoretic.  HENT:     Head: Normocephalic and atraumatic.     Mouth/Throat:     Pharynx: No oropharyngeal exudate.  Eyes:     Conjunctiva/sclera: Conjunctivae normal.     Pupils: Pupils are equal, round, and reactive to light.  Cardiovascular:     Rate and Rhythm: Normal rate and regular rhythm.     Heart sounds: Normal heart sounds.  Pulmonary:     Effort: Pulmonary effort is normal.     Breath sounds: Normal breath sounds.  Abdominal:     General: Bowel sounds are normal.     Palpations: Abdomen is soft.  Musculoskeletal:        General: Deformity (noted to left wrist due to  fracture) present.     Cervical back: Normal range of motion and neck supple.     Right lower leg: No edema.     Left lower leg: No edema.  Skin:    General: Skin is warm and dry.  Neurological:     Mental Status: She is alert and oriented to person, place, and time.     Motor: Weakness present.     Gait: Gait abnormal.  Psychiatric:        Mood and Affect: Mood is anxious.        Behavior: Behavior is cooperative.     Labs reviewed: Basic Metabolic Panel: Recent Labs    01/30/23 1741 04/28/23 1313 08/09/23 0327 08/11/23 0327 08/14/23 0401 08/15/23 0404 08/16/23 1610 09/02/23 2238 09/15/23 2250 09/16/23 0319 10/15/23 2017 10/15/23 2025 11/08/23 1416 12/16/23 1518  NA 134*   < > 137 137 137 135 134*   < >  --    < > 134* 135 141 142  K 3.7   < > 3.8 3.9 4.1 4.1 4.1   < >  --    < > 3.5 3.6 4.2 4.6  CL 95*   < > 100 100 100 97* 97*   < >  --    < > 97* 96* 102 103  CO2 29   < > 29 28 27 30 28    < >  --    < > 27  --  31 32  GLUCOSE 120*   < > 108* 119* 78 92 98   < >  --    < > 98 92 93 114  BUN 16   < > 15 17 26* 24* 23*   <  >  --    < > 8 5* 8 7  CREATININE 0.66   < > 0.42* 0.61 0.45 0.47 0.51   < >  --    < > 0.47 0.60 0.52 0.53  CALCIUM 8.7*   < > 8.4* 8.9 8.9 8.7* 8.5*   < >  --    < > 8.7*  --  10.0 9.4  MG 2.3   < > 2.5* 2.2 2.3  --  2.1  --   --   --   --   --   --   --   PHOS  --    < > 3.4  --  4.3  --  4.5  --   --   --   --   --   --   --   TSH 4.773*  --   --   --   --  17.644*  --   --  1.307  --   --   --   --   --    < > = values in this interval not displayed.   Liver Function Tests: Recent Labs    09/02/23 2238 09/16/23 0319 10/15/23 2017 11/08/23 1416 12/16/23 1518  AST 18 24 21  54* 17  ALT 17 18 18  80* 14  ALKPHOS 92 75 76  --   --   BILITOT 0.7 0.5 0.7 0.7 0.6  PROT 7.1 5.8* 7.0 6.7 6.4  ALBUMIN 4.1 3.3* 3.9  --   --    Recent Labs    07/31/23 1531 08/01/23 2116 09/02/23 2238  LIPASE 29 34 36   No results for input(s): "AMMONIA" in the last 8760 hours. CBC: Recent Labs    09/15/23 1839 09/16/23 0319 10/15/23 2017  10/15/23 2025 10/16/23 1317 11/08/23 1416  WBC 4.2   < > 14.3*  --  10.4 7.8  NEUTROABS 3.0  --  11.3*  --   --  5,530  HGB 13.5   < > 14.1 16.0* 12.2 15.1  HCT 42.8   < > 43.8 47.0* 39.3 44.8  MCV 100.5*   < > 100.2*  --  102.3* 96.1  PLT 329   < > 294  --  272 339   < > = values in this interval not displayed.   Lipid Panel: Recent Labs    01/30/23 1741  CHOL 165  HDL 62  LDLCALC 91  TRIG 58  CHOLHDL 2.7   TSH: Recent Labs    01/30/23 1741 08/15/23 0404 09/15/23 2250  TSH 4.773* 17.644* 1.307   A1C: Lab Results  Component Value Date   HGBA1C 5.5 11/08/2023     Assessment/Plan  Anxiety Assessment & Plan: Worsening anxiety due to history, homelessness, drug use, she needs to be followed by psych and discussed in patient evaluation if symptoms worsen. Psychiatry referral has been placed. Will start her on zoloft  as well and have close follow up.    Orders: -     Sertraline  HCl; Take 0.5 tablets (25 mg total) by mouth daily for 7  days, THEN 1 tablet (50 mg total) daily  Dispense: 30 tablet; Refill: 2  Rectal prolapse Assessment & Plan: Not current having issues today, however due to constipation will have worsening symptoms.  Discussed bowel regimen Metamucil daily to help bulk stools If you still have constipation take  Take miralax  17 gm  half dose- every other/daily for constipation   High risk heterosexual behavior Assessment & Plan: Educated about risk.   Orders: -     HIV Antibody (routine testing w rflx) -     RPR -     C. trachomatis/N. gonorrhoeae RNA -     HSV(herpes simplex vrs) 1+2 ab-IgG  Elevated liver enzymes Assessment & Plan: Will follow up lab today Discussed cessation of alcohol and drugs.   Orders: -     COMPLETE METABOLIC PANEL WITHOUT GFR  Arthritis Assessment & Plan: Generalized OA plus pain after fracture of wrist.  Discussed cessation of ETOH with use of tylenol  Tylenol  1000 mg by mouth twice daily as needed for pain.    Alcohol abuse Assessment & Plan: Discussed sensation, referral to therapy has been placed. SW also following patient.     Chronic obstructive pulmonary disease, unspecified COPD type (HCC) Assessment & Plan: Stable at this time. No worsening of symptoms.    Food insecurity Assessment & Plan: Referral placed. Ensures given today in office  Orders: -     AMB Referral VBCI Care Management  Homeless Assessment & Plan: Referral placed.   Orders: -     AMB Referral VBCI Care Management  Mixed stress and urge urinary incontinence Assessment & Plan: Ongoing, Rx have been given for incontinence supplies.    Other orders -     Acetaminophen ; Take 2 tablets (1,000 mg total) by mouth every 8 (eight) hours as needed.  Dispense: 90 tablet; Refill: 0 -     COMPLETE METABOLIC PANEL WITHOUT GFR     Return in about 6 weeks (around 01/27/2024) for mood, weight.:   Calen Geister K. [facility] [phone]

## 2023-12-16 NOTE — Telephone Encounter (Signed)
 Copied from CRM [id number]. Topic: General - Other >> Dec 06, 2023 11:28 AM Jayson Michael wrote: Reason for CRM: Patient saw an ad for free incontinence supplies through [employer]. She was informed a prescription is required for the supplies to be delivered. Patient would like a callback to help guide her through the next steps for obtaining a prescription and moving forward with the process. >> Dec 12, 2023  5:02 PM Corin V wrote: Patient states she has spoken to [employer] and they have still not received the paperwork for this order. >> Dec 09, 2023  2:26 PM Corin V wrote: Patient returned missed call. Advised that paperwork was faxed to [employer]

## 2023-12-17 ENCOUNTER — Other Ambulatory Visit (HOSPITAL_COMMUNITY): Payer: Self-pay

## 2023-12-17 ENCOUNTER — Encounter: Payer: Self-pay | Admitting: Nurse Practitioner

## 2023-12-17 ENCOUNTER — Other Ambulatory Visit: Payer: Self-pay

## 2023-12-17 LAB — COMPLETE METABOLIC PANEL WITHOUT GFR
AG Ratio: 2.2 (calc) (ref 1.0–2.5)
ALT: 14 U/L (ref 6–29)
AST: 17 U/L (ref 10–35)
Albumin: 4.4 g/dL (ref 3.6–5.1)
Alkaline phosphatase (APISO): 84 U/L (ref 37–153)
BUN: 7 mg/dL (ref 7–25)
CO2: 32 mmol/L (ref 20–32)
Calcium: 9.4 mg/dL (ref 8.6–10.4)
Chloride: 103 mmol/L (ref 98–110)
Creat: 0.53 mg/dL (ref 0.50–1.03)
Globulin: 2 g/dL (ref 1.9–3.7)
Glucose, Bld: 114 mg/dL (ref 65–139)
Potassium: 4.6 mmol/L (ref 3.5–5.3)
Sodium: 142 mmol/L (ref 135–146)
Total Bilirubin: 0.6 mg/dL (ref 0.2–1.2)
Total Protein: 6.4 g/dL (ref 6.1–8.1)

## 2023-12-17 LAB — HSV(HERPES SIMPLEX VRS) I + II AB-IGG
HSV 1 IGG,TYPE SPECIFIC AB: <0.9 {index}
HSV 2 IGG,TYPE SPECIFIC AB: 7.68 {index} — ABNORMAL HIGH

## 2023-12-17 LAB — C. TRACHOMATIS/N. GONORRHOEAE RNA
C. trachomatis RNA, TMA: NOT DETECTED
N. gonorrhoeae RNA, TMA: NOT DETECTED

## 2023-12-17 LAB — HIV ANTIBODY (ROUTINE TESTING W REFLEX): HIV 1&2 Ab, 4th Generation: NONREACTIVE

## 2023-12-17 LAB — RPR: RPR Ser Ql: NONREACTIVE

## 2023-12-18 ENCOUNTER — Telehealth: Payer: Self-pay

## 2023-12-18 DIAGNOSIS — F1010 Alcohol abuse, uncomplicated: Secondary | ICD-10-CM | POA: Insufficient documentation

## 2023-12-18 DIAGNOSIS — F419 Anxiety disorder, unspecified: Secondary | ICD-10-CM | POA: Insufficient documentation

## 2023-12-18 DIAGNOSIS — R748 Abnormal levels of other serum enzymes: Secondary | ICD-10-CM | POA: Insufficient documentation

## 2023-12-18 DIAGNOSIS — M1990 Unspecified osteoarthritis, unspecified site: Secondary | ICD-10-CM | POA: Insufficient documentation

## 2023-12-18 DIAGNOSIS — Z5941 Food insecurity: Secondary | ICD-10-CM | POA: Insufficient documentation

## 2023-12-18 DIAGNOSIS — N3946 Mixed incontinence: Secondary | ICD-10-CM | POA: Insufficient documentation

## 2023-12-18 DIAGNOSIS — K623 Rectal prolapse: Secondary | ICD-10-CM | POA: Insufficient documentation

## 2023-12-18 DIAGNOSIS — Z7251 High risk heterosexual behavior: Secondary | ICD-10-CM | POA: Insufficient documentation

## 2023-12-18 NOTE — Assessment & Plan Note (Signed)
 Stable at this time. No worsening of symptoms.

## 2023-12-18 NOTE — Assessment & Plan Note (Signed)
 Referral placed. Ensures given today in office

## 2023-12-18 NOTE — Assessment & Plan Note (Signed)
 Educated about risk.

## 2023-12-18 NOTE — Assessment & Plan Note (Signed)
 Referral placed.

## 2023-12-18 NOTE — Assessment & Plan Note (Signed)
 Will follow up lab today Discussed cessation of alcohol and drugs.

## 2023-12-18 NOTE — Assessment & Plan Note (Signed)
 Discussed sensation, referral to therapy has been placed. SW also following patient.

## 2023-12-18 NOTE — Assessment & Plan Note (Signed)
 Generalized OA plus pain after fracture of wrist.  Discussed cessation of ETOH with use of tylenol  Tylenol  1000 mg by mouth twice daily as needed for pain.

## 2023-12-18 NOTE — Telephone Encounter (Signed)
 Late Entry  Yesterday paperwork from [employer] was received requesting last office visit notes along with singing and dating of paperwork. A secure chat was sent to Verma Gobble, NP asking for notification once office visit notes are completed and closed.   As of today note remains open and paperwork is being held in the clinical intake area awaiting notification from Verma Gobble, NP

## 2023-12-18 NOTE — Assessment & Plan Note (Signed)
 Worsening anxiety due to history, homelessness, drug use, she needs to be followed by psych and discussed in patient evaluation if symptoms worsen. Psychiatry referral has been placed. Will start her on zoloft  as well and have close follow up.

## 2023-12-18 NOTE — Assessment & Plan Note (Signed)
 Ongoing, Rx have been given for incontinence supplies.

## 2023-12-18 NOTE — Assessment & Plan Note (Signed)
 Not current having issues today, however due to constipation will have worsening symptoms.  Discussed bowel regimen Metamucil daily to help bulk stools If you still have constipation take  Take miralax  17 gm  half dose- every other/daily for constipation

## 2023-12-19 ENCOUNTER — Telehealth: Payer: Self-pay

## 2023-12-19 NOTE — Progress Notes (Signed)
 Complex Care Management Note Care Guide Note  12/19/2023 Name: Stacy Bolton MRN: [id number] DOB: 30-Jun-1971   Complex Care Management Outreach Attempts: An unsuccessful telephone outreach was attempted today to offer the patient information about available complex care management services.  Follow Up Plan:  Additional outreach attempts will be made to offer the patient complex care management information and services.   Encounter Outcome:  No Answer  Jenee Spaugh [facility]  [facility] Guide Direct Dial: [phone]  Fax: [phone] Website: [email or web address]

## 2023-12-20 ENCOUNTER — Telehealth: Payer: Self-pay

## 2023-12-20 NOTE — Progress Notes (Signed)
 Complex Care Management Note Care Guide Note  12/20/2023 Name: Stacy Bolton MRN: [id number] DOB: September 26, 1970   Complex Care Management Outreach Attempts: A second unsuccessful outreach was attempted today to offer the patient with information about available complex care management services.  Follow Up Plan:  No further outreach attempts will be made at this time. We have been unable to contact the patient to offer or enroll patient in complex care management services.  Encounter Outcome:  No Answer  Taylr Meuth [facility]  [facility] Guide Direct Dial: [phone]  Fax: [phone] Website: [email or web address]

## 2023-12-24 ENCOUNTER — Telehealth: Payer: Self-pay

## 2023-12-24 NOTE — Progress Notes (Signed)
 Complex Care Management Note Care Guide Note  12/24/2023 Name: Stacy Bolton MRN: [id number] DOB: October 31, 1970   Complex Care Management Outreach Attempts: A third unsuccessful outreach was attempted today to offer the patient with information about available complex care management services.  Follow Up Plan:  No further outreach attempts will be made at this time. We have been unable to contact the patient to offer or enroll patient in complex care management services.  Encounter Outcome:  No Answer  Letizia Hook [facility]  [facility] Guide Direct Dial: [phone]  Fax: [phone] Website: [email or web address]

## 2023-12-25 ENCOUNTER — Encounter: Payer: Self-pay | Admitting: Licensed Clinical Social Worker

## 2023-12-25 ENCOUNTER — Telehealth: Payer: Self-pay

## 2023-12-25 NOTE — Telephone Encounter (Signed)
 Copied from CRM [id number]. Topic: Clinical - Lab/Test Results >> Dec 25, 2023  2:50 PM Stacy Bolton wrote: Reason for CRM:   Patient called in reference to a call back for lab results; Patient was informed of lab results; Patient did question whether she will be ok with the recent diagnosis. Patient was verbally upset and began to cry; Patient stated she wasn't in a space where she could speak freely so she declined a warm transfer to a nurse.   Please call the patient to clinically explain diagnosis.  Callback Number: [phone]

## 2023-12-25 NOTE — Telephone Encounter (Signed)
Message routed to PCP Eubanks, Jessica K, NP  

## 2023-12-25 NOTE — Telephone Encounter (Signed)
 Please clarify? She can also come in for appt if needed

## 2023-12-26 ENCOUNTER — Ambulatory Visit: Payer: Self-pay

## 2023-12-26 NOTE — Telephone Encounter (Signed)
What diagnosis?

## 2023-12-26 NOTE — Telephone Encounter (Signed)
 MyChart message sent to patient with PCP Roselie Conger, Champ Coma, NP response.

## 2023-12-26 NOTE — Telephone Encounter (Signed)
 Will discuss at follow up appt but can cause pain with outbreaks. To report any active lesions and we can treat.

## 2023-12-26 NOTE — Telephone Encounter (Signed)
 Copied from CRM [id number]. Topic: Clinical - Lab/Test Results >> Dec 25, 2023  2:50 PM Brittney F wrote: Reason for CRM:   Patient called in reference to a call back for lab results; Patient was informed of lab results; Patient did question whether she will be ok with the recent diagnosis. Patient was verbally upset and began to cry; Patient stated she wasn't in a space where she could speak freely so she declined a warm transfer to a nurse.   Please call the patient to clinically explain diagnosis.  Callback Number: [phone] >> Dec 26, 2023  9:57 AM Tisa Forester wrote: Patient calling back to go over lab results and diagnosis   Chief Complaint: Lab results  Additional Notes: Pt called to discuss lab results and   Reason for Disposition  Caller requesting lab results  (Exception: Routine or non-urgent lab result.)  Answer Assessment - Initial Assessment Questions 1. REASON FOR CALL or QUESTION: "What is your reason for calling today?" or "How can I best help you?" or "What question do you have that I can help answer?"     Patient asking for lab results  2. CALLER: Document the source of call. (e.g., laboratory, patient).     Patient  Protocols used: PCP Call - No Triage-A-AH

## 2023-12-26 NOTE — Telephone Encounter (Signed)
 Awaiting reply from PCP Roselie Conger Champ Coma, NP. See other encounter.

## 2023-12-26 NOTE — Telephone Encounter (Signed)
 Patient wants to know is this diagnosis going to cause any issues or is this going to hurt her in any other way health wise? She refused to do follow up appointment/video visit. I did offer. Message routed back to PCP Roselie Conger, Champ Coma, NP

## 2023-12-26 NOTE — Telephone Encounter (Signed)
HSV

## 2023-12-26 NOTE — Telephone Encounter (Signed)
 Copied from CRM [id number]. Topic: Clinical - Lab/Test Results >> Dec 25, 2023  2:50 PM Brittney F wrote: Reason for CRM:   Patient called in reference to a call back for lab results; Patient was informed of lab results; Patient did question whether she will be ok with the recent diagnosis. Patient was verbally upset and began to cry; Patient stated she wasn't in a space where she could speak freely so she declined a warm transfer to a nurse.   Please call the patient to clinically explain diagnosis.  Callback Number: [phone] >> Dec 26, 2023  9:57 AM Tisa Forester wrote: Patient calling back to go over lab results

## 2023-12-27 ENCOUNTER — Telehealth: Payer: Self-pay | Admitting: *Deleted

## 2023-12-27 NOTE — Progress Notes (Signed)
 Complex Care Management Care Guide Note  12/27/2023 Name: Stacy Bolton MRN: [id number] DOB: 1971/02/08  Stacy Bolton is a 53 y.o. year old female who is a primary care patient of Verma Gobble, NP and is actively engaged with the care management team. I reached out to Devinne A Basara by phone today to assist with re-scheduling  with the Licensed Clinical Child psychotherapist.  Follow up plan: Unsuccessful telephone outreach attempt made.   Barnie Bora  [facility]  [facility]Based [facility], [facility] Guide  Direct Dial: [phone]  Fax [phone]

## 2024-01-08 NOTE — Progress Notes (Signed)
 Complex Care Management Care Guide Note  01/08/2024 Name: Stacy Bolton MRN: [id number] DOB: 1970/10/05  Armenta Bernheim is a 53 y.o. year old female who is a primary care patient of Verma Gobble, NP and is actively engaged with the care management team. I reached out to Vera A Przybysz by phone today to assist with re-scheduling  with the Licensed Clinical Child psychotherapist.  Follow up plan: Unsuccessful telephone outreach attempt made. No further outreach attempts will be made at this time. We have been unable to contact the patient to reschedule for complex care management services.  Barnie Bora  [facility]  Value[facility]Based Care Institute, [facility] Guide  Direct Dial: [phone]  Fax [phone]

## 2024-01-09 ENCOUNTER — Other Ambulatory Visit: Payer: Self-pay

## 2024-01-09 ENCOUNTER — Other Ambulatory Visit: Payer: Self-pay | Admitting: Nurse Practitioner

## 2024-01-09 ENCOUNTER — Other Ambulatory Visit (HOSPITAL_COMMUNITY): Payer: Self-pay

## 2024-01-09 DIAGNOSIS — K219 Gastro-esophageal reflux disease without esophagitis: Secondary | ICD-10-CM

## 2024-01-09 MED ORDER — FLUTICASONE PROPIONATE 50 MCG/ACT NA SUSP
1.0000 | Freq: Every day | NASAL | 1 refills | Status: DC
  Filled 2024-01-09: qty 16, 60d supply, fill #0
  Filled 2024-03-05: qty 16, 60d supply, fill #1

## 2024-01-09 MED ORDER — PANTOPRAZOLE SODIUM 40 MG PO TBEC
40.0000 mg | DELAYED_RELEASE_TABLET | Freq: Every day | ORAL | 0 refills | Status: DC
  Filled 2024-01-09: qty 30, 30d supply, fill #0

## 2024-01-09 NOTE — Telephone Encounter (Signed)
 Patient medication has end date. Medication pend and sent to PCP Janyth Contes Janene Harvey, NP for approval.

## 2024-01-09 NOTE — Telephone Encounter (Signed)
 Patient has request refill on medication that hasn't been refilled by PCP Roselie Conger Champ Coma, NP yet. Medication pend and sent to provider.

## 2024-01-10 ENCOUNTER — Other Ambulatory Visit (HOSPITAL_COMMUNITY): Payer: Self-pay

## 2024-01-10 ENCOUNTER — Other Ambulatory Visit: Payer: Self-pay

## 2024-02-07 ENCOUNTER — Other Ambulatory Visit (HOSPITAL_COMMUNITY): Payer: Self-pay

## 2024-02-07 ENCOUNTER — Encounter: Payer: Self-pay | Admitting: Pharmacist

## 2024-02-07 ENCOUNTER — Encounter: Payer: Self-pay | Admitting: Nurse Practitioner

## 2024-02-07 ENCOUNTER — Ambulatory Visit (INDEPENDENT_AMBULATORY_CARE_PROVIDER_SITE_OTHER): Admitting: Nurse Practitioner

## 2024-02-07 ENCOUNTER — Other Ambulatory Visit: Payer: Self-pay

## 2024-02-07 ENCOUNTER — Other Ambulatory Visit: Payer: Self-pay | Admitting: Nurse Practitioner

## 2024-02-07 VITALS — BP 121/76 | HR 88 | Temp 98.2°F | Resp 20 | Ht 65.0 in | Wt 103.6 lb

## 2024-02-07 DIAGNOSIS — F17200 Nicotine dependence, unspecified, uncomplicated: Secondary | ICD-10-CM | POA: Diagnosis not present

## 2024-02-07 DIAGNOSIS — J302 Other seasonal allergic rhinitis: Secondary | ICD-10-CM | POA: Diagnosis not present

## 2024-02-07 DIAGNOSIS — N898 Other specified noninflammatory disorders of vagina: Secondary | ICD-10-CM

## 2024-02-07 DIAGNOSIS — Z5941 Food insecurity: Secondary | ICD-10-CM | POA: Diagnosis not present

## 2024-02-07 DIAGNOSIS — Z124 Encounter for screening for malignant neoplasm of cervix: Secondary | ICD-10-CM

## 2024-02-07 DIAGNOSIS — F419 Anxiety disorder, unspecified: Secondary | ICD-10-CM

## 2024-02-07 MED ORDER — LORATADINE 10 MG PO TABS
10.0000 mg | ORAL_TABLET | Freq: Every day | ORAL | 1 refills | Status: DC
  Filled 2024-02-07: qty 30, 30d supply, fill #0
  Filled 2024-02-11 – 2024-03-05 (×2): qty 30, 30d supply, fill #1

## 2024-02-07 MED ORDER — METRONIDAZOLE 500 MG PO TABS
500.0000 mg | ORAL_TABLET | Freq: Two times a day (BID) | ORAL | 0 refills | Status: AC
Start: 2024-02-07 — End: 2024-02-14
  Filled 2024-02-07: qty 14, 7d supply, fill #0

## 2024-02-07 MED ORDER — SERTRALINE HCL 50 MG PO TABS
50.0000 mg | ORAL_TABLET | Freq: Every day | ORAL | 1 refills | Status: DC
  Filled 2024-02-07: qty 90, 90d supply, fill #0
  Filled 2024-02-11 – 2024-05-08 (×3): qty 90, 90d supply, fill #1

## 2024-02-07 NOTE — Progress Notes (Signed)
 Careteam: Patient Care Team: Verma Gobble, NP as PCP - General (Geriatric Medicine) Adriana Albany, LCSW as VBCI Care Management (Licensed Clinical Social Worker) Adriana Albany, LCSW (Licensed Clinical Social Worker)  PLACE OF SERVICE:  [facility]  Advanced Directive information    Allergies  Allergen Reactions   Hydrocodone -Acetaminophen  Itching   Hydromorphone Hcl Itching   Oxycodone -Acetaminophen  Itching    Chief Complaint  Patient presents with   Medical Management of Chronic Issues     6 weeks follow-up for mood, weight .In addition needs to discuss Lung Cancer Screening ( order is pending), Cervical Cancer Screening and Shingles Vaccine.    HPI:  Discussed the use of AI scribe software for clinical note transcription with the patient, who gave verbal consent to proceed.  History of Present Illness Stacy Bolton is a 53 year old female who presents for a six-week follow-up after starting Zoloft .  She has been taking Zoloft , starting with half a tablet for a week and then increasing to 50 mg. She notes some improvement but continues to experience severe anxiety attacks. She is about to run out of her medication and requires a refill.  She weighs 103 pounds and is concerned about weight loss. There was a referral for food and housing assistance, but she has not been able to connect with the services due to phone issues. Her phone sometimes does not ring or goes directly to voicemail, and she suspects it may have a virus.  She reports a 'milky, discharge, gross, stinky' vaginal discharge, which she finds 'disgusting'. She has been trying to keep herself clean but feels self-conscious about the odor. She has never experienced anything like this before.  She continues to smoke cigarettes but mentions financial constraints affecting her ability to purchase them. She has a history of uterine cancer after the birth of her son and endometriosis, which was  problematic until her periods stopped. She still has her uterus.  She is under stress due to her living situation, describing it as unstable and affecting her mental state. She mentions difficulty thinking straight due to the environment. She has been referred to various social services and addiction treatment centers but has had difficulty accessing these resources due to communication issues.    Review of Systems:  Review of Systems  Constitutional:  Negative for chills, fever and weight loss.  HENT:  Negative for tinnitus.   Respiratory:  Negative for cough, sputum production and shortness of breath.   Cardiovascular:  Negative for chest pain, palpitations and leg swelling.  Gastrointestinal:  Negative for abdominal pain, constipation, diarrhea and heartburn.  Genitourinary:  Negative for dysuria, frequency and urgency.  Musculoskeletal:  Positive for myalgias. Negative for back pain, falls and joint pain.  Skin: Negative.   Neurological:  Negative for dizziness and headaches.  Psychiatric/Behavioral:  Negative for depression and memory loss. The patient is nervous/anxious (improved). The patient does not have insomnia.     Past Medical History:  Diagnosis Date   Anxiety    Asthma    COPD (chronic obstructive pulmonary disease) (HCC)    Dyspnea    Endometriosis    Seizure (HCC)    Past Surgical History:  Procedure Laterality Date   CHOLECYSTECTOMY     Social History:   reports that she has been smoking cigarettes. She has a 45 pack-year smoking history. She does not have any smokeless tobacco history on file. She reports current alcohol use. She reports current drug use. Drugs:  Cocaine and Marijuana.  Family History  Problem Relation Age of Onset   Cancer Father        stomach and colon cancer    Medications: Patient's Medications  New Prescriptions   No medications on file  Previous Medications   ACETAMINOPHEN  (TYLENOL ) 500 MG TABLET    Take 2 tablets (1,000 mg  total) by mouth every 8 (eight) hours as needed.   ALBUTEROL  (VENTOLIN  HFA) 108 (90 BASE) MCG/ACT INHALER    Inhale 2 puffs into the lungs every 6 (six) hours as needed for wheezing or shortness of breath.   BUDESONIDE -FORMOTEROL  (SYMBICORT ) 160-4.5 MCG/ACT INHALER    Inhale 2 puffs into the lungs in the morning and at bedtime.   FLUTICASONE  (FLONASE ) 50 MCG/ACT NASAL SPRAY    Place 1 spray into both nostrils daily.   GUAIFENESIN  (MUCINEX ) 600 MG 12 HR TABLET    Take 1 tablet (600 mg total) by mouth 2 (two) times daily.   LEVOTHYROXINE  (SYNTHROID ) 25 MCG TABLET    Take 1 tablet (25 mcg total) by mouth daily before breakfast.   LORATADINE  (CLARITIN ) 10 MG TABLET    Take 1 tablet (10 mg total) by mouth daily.   PANTOPRAZOLE  (PROTONIX ) 40 MG TABLET    Take 1 tablet (40 mg total) by mouth daily.   SERTRALINE  (ZOLOFT ) 50 MG TABLET    Take 0.5 tablets (25 mg total) by mouth daily for 7 days, THEN 1 tablet (50 mg total) daily  Modified Medications   No medications on file  Discontinued Medications   No medications on file    Physical Exam:  Vitals:   02/07/24 1338  BP: 121/76  Pulse: 88  Resp: 20  Temp: 98.2 F (36.8 C)  SpO2: 95%  Weight: 103 lb 9.6 oz (47 kg)  Height: 5' 5 (1.651 m)   Body mass index is 17.24 kg/m. Wt Readings from Last 3 Encounters:  02/07/24 103 lb 9.6 oz (47 kg)  12/16/23 106 lb (48.1 kg)  11/08/23 109 lb 3.2 oz (49.5 kg)    Physical Exam Exam conducted with a chaperone present.  Constitutional:      General: She is not in acute distress.    Appearance: She is well-developed. She is not diaphoretic.  HENT:     Head: Normocephalic and atraumatic.     Mouth/Throat:     Pharynx: No oropharyngeal exudate.   Eyes:     Conjunctiva/sclera: Conjunctivae normal.     Pupils: Pupils are equal, round, and reactive to light.    Cardiovascular:     Rate and Rhythm: Normal rate and regular rhythm.     Heart sounds: Normal heart sounds.  Pulmonary:     Effort:  Pulmonary effort is normal.     Breath sounds: Normal breath sounds.  Abdominal:     General: Bowel sounds are normal.     Palpations: Abdomen is soft.  Genitourinary:    Vagina: Vaginal discharge and bleeding present.     Cervix: Friability present.     Comments: Copious amounts of discharge on exam  Musculoskeletal:     Cervical back: Normal range of motion and neck supple.     Right lower leg: No edema.     Left lower leg: No edema.   Skin:    General: Skin is warm and dry.   Neurological:     Mental Status: She is alert.   Psychiatric:        Mood and Affect: Mood normal.  Labs reviewed: Basic Metabolic Panel: Recent Labs    08/09/23 0327 08/11/23 0327 08/14/23 0401 08/15/23 0404 08/16/23 3244 09/02/23 2238 09/15/23 2250 09/16/23 0319 10/15/23 2017 10/15/23 2025 11/08/23 1416 12/16/23 1518  NA 137 137 137 135 134*   < >  --    < > 134* 135 141 142  K 3.8 3.9 4.1 4.1 4.1   < >  --    < > 3.5 3.6 4.2 4.6  CL 100 100 100 97* 97*   < >  --    < > 97* 96* 102 103  CO2 29 28 27 30 28    < >  --    < > 27  --  31 32  GLUCOSE 108* 119* 78 92 98   < >  --    < > 98 92 93 114  BUN 15 17 26* 24* 23*   < >  --    < > 8 5* 8 7  CREATININE 0.42* 0.61 0.45 0.47 0.51   < >  --    < > 0.47 0.60 0.52 0.53  CALCIUM 8.4* 8.9 8.9 8.7* 8.5*   < >  --    < > 8.7*  --  10.0 9.4  MG 2.5* 2.2 2.3  --  2.1  --   --   --   --   --   --   --   PHOS 3.4  --  4.3  --  4.5  --   --   --   --   --   --   --   TSH  --   --   --  17.644*  --   --  1.307  --   --   --   --   --    < > = values in this interval not displayed.   Liver Function Tests: Recent Labs    09/02/23 2238 09/16/23 0319 10/15/23 2017 11/08/23 1416 12/16/23 1518  AST 18 24 21  54* 17  ALT 17 18 18  80* 14  ALKPHOS 92 75 76  --   --   BILITOT 0.7 0.5 0.7 0.7 0.6  PROT 7.1 5.8* 7.0 6.7 6.4  ALBUMIN 4.1 3.3* 3.9  --   --    Recent Labs    07/31/23 1531 08/01/23 2116 09/02/23 2238  LIPASE 29 34 36   No  results for input(s): AMMONIA in the last 8760 hours. CBC: Recent Labs    09/15/23 1839 09/16/23 0319 10/15/23 2017 10/15/23 2025 10/16/23 1317 11/08/23 1416  WBC 4.2   < > 14.3*  --  10.4 7.8  NEUTROABS 3.0  --  11.3*  --   --  5,530  HGB 13.5   < > 14.1 16.0* 12.2 15.1  HCT 42.8   < > 43.8 47.0* 39.3 44.8  MCV 100.5*   < > 100.2*  --  102.3* 96.1  PLT 329   < > 294  --  272 339   < > = values in this interval not displayed.   Lipid Panel: No results for input(s): CHOL, HDL, LDLCALC, TRIG, CHOLHDL, LDLDIRECT in the last 8760 hours. TSH: Recent Labs    08/15/23 0404 09/15/23 2250  TSH 17.644* 1.307   A1C: Lab Results  Component Value Date   HGBA1C 5.5 11/08/2023     Assessment/Plan  1. Anxiety Improved with zoloft , will continue current regimen  Also recommend counseling through psych services.  - sertraline  (ZOLOFT ) 50  MG tablet; Take 1 tablet (50 mg total) by mouth daily.  Dispense: 90 tablet; Refill: 1  2. Seasonal allergies - loratadine  (CLARITIN ) 10 MG tablet; Take 1 tablet (10 mg total) by mouth daily.  Dispense: 30 tablet; Refill: 1  3. Smoker (Primary) - Ambulatory Referral for Lung Cancer Screening [REF832]  4. Food insecurity Resources given, SW number give  5. Vaginal discharge -copious amounts of green/yellow discharge - Chlamydia/Neisseria Gonorrhoeae RNA,TMA,Urogenital - SureSwab Advanced Vaginitis, TMA - metroNIDAZOLE (FLAGYL) 500 MG tablet; Take 1 tablet (500 mg total) by mouth 2 (two) times daily for 7 days.  Dispense: 14 tablet; Refill: 0  6. Cervical cancer screening - PAP, IG w/ Reflex HPV when ASCU [[facility]]  Romond Pipkins K. Denney Fisherman [facility] [phone]

## 2024-02-07 NOTE — Patient Instructions (Addendum)
 Please visit your local pharmacy to receive your Shingles vaccine, if you have not already received.     To call for help with housing and food- this is a [place] [phone]   Please reach out to these places to see about getting help for alcohol  [facility] [phone]  [facility] [place]  [phone]  [facility] [phone]    Start flagyl 500 mg by mouth twice daily for 7 days Do not drink alcohol while on medication- may make you very sick Complete entire course of medication

## 2024-02-10 ENCOUNTER — Other Ambulatory Visit (HOSPITAL_COMMUNITY): Payer: Self-pay

## 2024-02-10 ENCOUNTER — Encounter (HOSPITAL_COMMUNITY): Payer: Self-pay

## 2024-02-10 ENCOUNTER — Ambulatory Visit: Payer: Self-pay | Admitting: Nurse Practitioner

## 2024-02-10 MED ORDER — GUAIFENESIN ER 600 MG PO TB12
600.0000 mg | ORAL_TABLET | Freq: Two times a day (BID) | ORAL | 0 refills | Status: DC
  Filled 2024-02-10: qty 60, 30d supply, fill #0

## 2024-02-11 ENCOUNTER — Other Ambulatory Visit: Payer: Self-pay

## 2024-02-11 ENCOUNTER — Other Ambulatory Visit (HOSPITAL_COMMUNITY): Payer: Self-pay

## 2024-02-11 LAB — SURESWAB® ADVANCED VAGINITIS,TMA
CANDIDA SPECIES: NOT DETECTED
Candida glabrata: NOT DETECTED
SURESWAB(R) ADV BACTERIAL VAGINOSIS(BV),TMA: POSITIVE — AB
TRICHOMONAS VAGINALIS (TV),TMA: DETECTED — AB

## 2024-02-11 LAB — CHLAMYDIA/NEISSERIA GONORRHOEAE RNA,TMA,UROGENTIAL
C. trachomatis RNA, TMA: NOT DETECTED
N. gonorrhoeae RNA, TMA: NOT DETECTED

## 2024-02-13 LAB — PAP IG W/ RFLX HPV ASCU

## 2024-03-05 ENCOUNTER — Other Ambulatory Visit: Payer: Self-pay | Admitting: Physician Assistant

## 2024-03-05 ENCOUNTER — Other Ambulatory Visit: Payer: Self-pay | Admitting: Nurse Practitioner

## 2024-03-05 DIAGNOSIS — K219 Gastro-esophageal reflux disease without esophagitis: Secondary | ICD-10-CM

## 2024-03-06 ENCOUNTER — Other Ambulatory Visit (HOSPITAL_COMMUNITY): Payer: Self-pay

## 2024-03-06 ENCOUNTER — Other Ambulatory Visit: Payer: Self-pay

## 2024-03-06 MED ORDER — PANTOPRAZOLE SODIUM 40 MG PO TBEC
40.0000 mg | DELAYED_RELEASE_TABLET | Freq: Every day | ORAL | 3 refills | Status: AC
  Filled 2024-03-06: qty 30, 30d supply, fill #0
  Filled 2024-05-08: qty 30, 30d supply, fill #1
  Filled 2024-06-25: qty 30, 30d supply, fill #2

## 2024-03-16 ENCOUNTER — Other Ambulatory Visit: Payer: Self-pay

## 2024-03-16 MED ORDER — LEVOTHYROXINE SODIUM 25 MCG PO TABS
25.0000 ug | ORAL_TABLET | Freq: Every day | ORAL | 1 refills | Status: DC
  Filled 2024-03-16: qty 60, 60d supply, fill #0
  Filled 2024-05-25: qty 60, 60d supply, fill #1

## 2024-04-24 ENCOUNTER — Other Ambulatory Visit (HOSPITAL_COMMUNITY): Payer: Self-pay

## 2024-04-24 ENCOUNTER — Other Ambulatory Visit: Payer: Self-pay

## 2024-04-24 ENCOUNTER — Other Ambulatory Visit: Payer: Self-pay | Admitting: Nurse Practitioner

## 2024-04-24 DIAGNOSIS — J302 Other seasonal allergic rhinitis: Secondary | ICD-10-CM

## 2024-04-24 MED ORDER — ACETAMINOPHEN 500 MG PO TABS
1000.0000 mg | ORAL_TABLET | Freq: Three times a day (TID) | ORAL | 1 refills | Status: DC | PRN
  Filled 2024-04-24: qty 90, 15d supply, fill #0
  Filled 2024-05-25 (×2): qty 90, 15d supply, fill #1

## 2024-04-24 MED ORDER — LORATADINE 10 MG PO TABS
10.0000 mg | ORAL_TABLET | Freq: Every day | ORAL | 1 refills | Status: AC
  Filled 2024-04-24: qty 30, 30d supply, fill #0
  Filled 2024-06-25: qty 30, 30d supply, fill #2
  Filled 2024-06-25: qty 30, 30d supply, fill #1
  Filled 2024-07-09 – 2024-09-03 (×2): qty 30, 30d supply, fill #2

## 2024-04-24 MED ORDER — FLUTICASONE PROPIONATE 50 MCG/ACT NA SUSP
1.0000 | Freq: Every day | NASAL | 5 refills | Status: AC
  Filled 2024-04-24: qty 16, 60d supply, fill #0
  Filled 2024-07-09: qty 16, 60d supply, fill #1
  Filled 2024-09-03: qty 16, 60d supply, fill #2

## 2024-04-24 NOTE — Telephone Encounter (Signed)
Pharmacy requested refill. Pended Rx and sent to [facility] for approval due to HIGH ALERT Warning.  

## 2024-05-01 ENCOUNTER — Other Ambulatory Visit (HOSPITAL_COMMUNITY): Payer: Self-pay

## 2024-05-05 ENCOUNTER — Other Ambulatory Visit (HOSPITAL_COMMUNITY): Payer: Self-pay

## 2024-05-05 ENCOUNTER — Other Ambulatory Visit: Payer: Self-pay | Admitting: Nurse Practitioner

## 2024-05-06 ENCOUNTER — Other Ambulatory Visit (HOSPITAL_COMMUNITY): Payer: Self-pay

## 2024-05-06 ENCOUNTER — Other Ambulatory Visit: Payer: Self-pay

## 2024-05-06 MED ORDER — ALBUTEROL SULFATE HFA 108 (90 BASE) MCG/ACT IN AERS
2.0000 | INHALATION_SPRAY | Freq: Four times a day (QID) | RESPIRATORY_TRACT | 0 refills | Status: DC | PRN
  Filled 2024-05-06: qty 18, 25d supply, fill #0

## 2024-05-06 NOTE — Telephone Encounter (Signed)
 Pharmacy requested refill.  Pended Rx and sent to [facility] for approval.

## 2024-05-08 ENCOUNTER — Other Ambulatory Visit (HOSPITAL_COMMUNITY): Payer: Self-pay

## 2024-05-18 ENCOUNTER — Ambulatory Visit: Admitting: Nurse Practitioner

## 2024-05-25 ENCOUNTER — Other Ambulatory Visit: Payer: Self-pay

## 2024-05-25 ENCOUNTER — Other Ambulatory Visit (HOSPITAL_COMMUNITY): Payer: Self-pay

## 2024-05-25 ENCOUNTER — Other Ambulatory Visit: Payer: Self-pay | Admitting: Nurse Practitioner

## 2024-05-25 MED ORDER — ALBUTEROL SULFATE HFA 108 (90 BASE) MCG/ACT IN AERS
2.0000 | INHALATION_SPRAY | Freq: Four times a day (QID) | RESPIRATORY_TRACT | 0 refills | Status: DC | PRN
  Filled 2024-05-25: qty 18, 25d supply, fill #0

## 2024-05-26 ENCOUNTER — Other Ambulatory Visit: Payer: Self-pay

## 2024-06-25 ENCOUNTER — Other Ambulatory Visit (HOSPITAL_COMMUNITY): Payer: Self-pay

## 2024-06-25 ENCOUNTER — Other Ambulatory Visit: Payer: Self-pay | Admitting: Nurse Practitioner

## 2024-06-25 ENCOUNTER — Other Ambulatory Visit: Payer: Self-pay

## 2024-06-25 ENCOUNTER — Telehealth: Payer: Self-pay

## 2024-06-25 DIAGNOSIS — F419 Anxiety disorder, unspecified: Secondary | ICD-10-CM

## 2024-06-25 MED ORDER — SERTRALINE HCL 50 MG PO TABS
50.0000 mg | ORAL_TABLET | Freq: Every day | ORAL | 2 refills | Status: AC
  Filled 2024-06-25 – 2024-09-03 (×4): qty 90, 90d supply, fill #0

## 2024-06-25 MED ORDER — ALBUTEROL SULFATE HFA 108 (90 BASE) MCG/ACT IN AERS
2.0000 | INHALATION_SPRAY | Freq: Four times a day (QID) | RESPIRATORY_TRACT | 1 refills | Status: AC | PRN
  Filled 2024-06-25: qty 6.7, 25d supply, fill #0

## 2024-06-25 NOTE — Telephone Encounter (Signed)
 I see that form / signature was faxed on 06/22/2024. Not seeing an rx sent to [facility]

## 2024-06-25 NOTE — Telephone Encounter (Signed)
 Copied from CRM [id number]. Topic: Clinical - Prescription Issue >> Jun 25, 2024  3:12 PM Debby BROCKS wrote: Reason for CRM: Patient states that Urology Requested a prescription to the PCP of a small size of prevail classic pull ons but they have not received an answer yet to be able to provide it for the patient

## 2024-06-26 NOTE — Telephone Encounter (Signed)
 Patient Is calling to check status of prescription. I informed her that clinic closes at [facility] on Fridays. Patient would like a callback with status update.

## 2024-06-29 NOTE — Telephone Encounter (Signed)
 I called [facility] [phone]), they received faxed that we sent on 06/17/24, per receptionist order was finalized on Friday and will ship out tomorrow.   I tried to call patient with status update, no answer and no voicemail. If patient returns call, E[employer] agent to relay the above information and the phone number to [facility] to receive additional updates on shipping and delivery.

## 2024-07-06 ENCOUNTER — Ambulatory Visit: Payer: Self-pay | Admitting: Nurse Practitioner

## 2024-07-06 ENCOUNTER — Other Ambulatory Visit (HOSPITAL_BASED_OUTPATIENT_CLINIC_OR_DEPARTMENT_OTHER): Payer: Self-pay

## 2024-07-06 ENCOUNTER — Other Ambulatory Visit: Payer: Self-pay

## 2024-07-06 ENCOUNTER — Encounter: Payer: Self-pay | Admitting: Nurse Practitioner

## 2024-07-06 VITALS — BP 110/78 | HR 72 | Temp 97.8°F | Ht 65.0 in | Wt 108.8 lb

## 2024-07-06 DIAGNOSIS — Z13220 Encounter for screening for lipoid disorders: Secondary | ICD-10-CM

## 2024-07-06 DIAGNOSIS — Z23 Encounter for immunization: Secondary | ICD-10-CM | POA: Diagnosis not present

## 2024-07-06 DIAGNOSIS — F419 Anxiety disorder, unspecified: Secondary | ICD-10-CM

## 2024-07-06 DIAGNOSIS — Z1211 Encounter for screening for malignant neoplasm of colon: Secondary | ICD-10-CM | POA: Diagnosis not present

## 2024-07-06 DIAGNOSIS — R739 Hyperglycemia, unspecified: Secondary | ICD-10-CM

## 2024-07-06 DIAGNOSIS — Z0000 Encounter for general adult medical examination without abnormal findings: Secondary | ICD-10-CM | POA: Diagnosis not present

## 2024-07-06 DIAGNOSIS — F17200 Nicotine dependence, unspecified, uncomplicated: Secondary | ICD-10-CM | POA: Diagnosis not present

## 2024-07-06 DIAGNOSIS — Z1231 Encounter for screening mammogram for malignant neoplasm of breast: Secondary | ICD-10-CM

## 2024-07-06 DIAGNOSIS — Z1212 Encounter for screening for malignant neoplasm of rectum: Secondary | ICD-10-CM | POA: Diagnosis not present

## 2024-07-06 DIAGNOSIS — E039 Hypothyroidism, unspecified: Secondary | ICD-10-CM | POA: Diagnosis not present

## 2024-07-06 MED ORDER — GUAIFENESIN ER 600 MG PO TB12
600.0000 mg | ORAL_TABLET | Freq: Two times a day (BID) | ORAL | 0 refills | Status: AC
  Filled 2024-07-06 – 2024-09-03 (×3): qty 60, 30d supply, fill #0

## 2024-07-06 NOTE — Progress Notes (Signed)
 Provider: Caro Harlene POUR, NP  Patient Care Team: Caro Harlene POUR, NP as PCP - General (Geriatric Medicine)  Extended Emergency Contact Information Primary Emergency Contact: LASSITER,RAHEL Mobile Phone: [phone] Relation: Friend Allergies  Allergen Reactions   Hydrocodone -Acetaminophen  Itching   Hydromorphone Hcl Itching   Oxycodone -Acetaminophen  Itching   Code Status: FULL  Goals of Care: Advanced Directive information    11/08/2023    1:22 PM  Advanced Directives  Does Patient Have a Medical Advance Directive? No  Would patient like information on creating a medical advance directive? No - Patient declined     Chief Complaint  Patient presents with   Annual Exam    Patient hasn't had any      HPI: Patient is a 53 y.o. female seen in today for an wellness exam at [facility] Discussed the use of AI scribe software for clinical note transcription with the patient, who gave verbal consent to proceed.  History of Present Illness Stacy Bolton is a 53 year old female who presents for a preventative health care visit.  She has experienced a recent weight gain of five pounds. Has been having issues with getting food but now able to get meals.   She is about to start a program at [employer] and has begun receiving food deliveries on Fridays. No longer homeless.    She continues to smoke.   Her family history is significant for cancer, with three female relatives having breast cancer and her father having died from colon cancer. She acknowledges gastrointestinal issues, including past abdominal pain and nausea.   She has been exercising more, no longer uses walker.   Her vision has been getting blurrier, and she suspects she might have cataracts.   She does not have regular teeth and uses dentures.  No vaginal bleeding, and a previous issue with discharge has resolved. Her bowels are moving normally, and she is eating better.        02/07/2024    1:47 PM 11/08/2023    1:17 PM 10/02/2023    9:40 AM  Depression screen PHQ 2/9  Decreased Interest 2 1 0  Down, Depressed, Hopeless 1 1 3   PHQ - 2 Score 3 2 3   Altered sleeping 0 0 3  Tired, decreased energy 2 2 3   Change in appetite 0 0 0  Feeling bad or failure about yourself  2 2 2   Trouble concentrating 3 3 3   Moving slowly or fidgety/restless 1 0 3  Suicidal thoughts 0 0 0  PHQ-9 Score 11  9  17    Difficult doing work/chores  Extremely dIfficult      Data saved with a previous flowsheet row definition       11/08/2023    1:17 PM 02/07/2024    1:46 PM  Fall Risk  Falls in the past year? 1 1  Was there an injury with Fall? 1 0  Fall Risk Category Calculator 2 2  Patient at Risk for Falls Due to Impaired balance/gait History of fall(s)  Fall risk Follow up Falls evaluation completed Falls evaluation completed;Education provided;Falls prevention discussed       No data to display           Health Maintenance  Topic Date Due   COVID-19 Vaccine (1) Never done   Hepatitis B Vaccines 19-59 Average Risk (1 of 3 - 19+ 3-dose series) Never done   Zoster Vaccines- Shingrix (1 of 2) Never done   Mammogram  Never done  Colonoscopy  Never done   Lung Cancer Screening  01/20/2024   DTaP/Tdap/Td (2 - Td or Tdap) 12/15/2024 (Originally 01/26/2019)   Cervical Cancer Screening (HPV/Pap Cotest)  02/07/2027   Pneumococcal Vaccine: 50+ Years  Completed   Influenza Vaccine  Completed   Hepatitis C Screening  Completed   HIV Screening  Completed   HPV VACCINES  Aged Out   Meningococcal B Vaccine  Aged Out    Past Medical History:  Diagnosis Date   Anxiety    Asthma    COPD (chronic obstructive pulmonary disease) (HCC)    Dyspnea    Endometriosis    Seizure (HCC)     Past Surgical History:  Procedure Laterality Date   CHOLECYSTECTOMY      Social History   Socioeconomic History   Marital status: Single    Spouse name: Not on file   Number of children:  Not on file   Years of education: Not on file   Highest education level: Not on file  Occupational History   Not on file  Tobacco Use   Smoking status: Every Day    Current packs/day: 1.50    Average packs/day: 1.5 packs/day for 30.0 years (45.0 ttl pk-yrs)    Types: Cigarettes   Smokeless tobacco: Not on file  Vaping Use   Vaping status: Never Used  Substance and Sexual Activity   Alcohol use: Yes    Comment: last drink 08/05/23   Drug use: Yes    Types: Cocaine, Marijuana    Comment: last used 08/05/23   Sexual activity: Not Currently  Other Topics Concern   Not on file  Social History Narrative   Not on file   Social Drivers of Health   Financial Resource Strain: High Risk (11/14/2023)   Overall Financial Resource Strain (CARDIA)    Difficulty of Paying Living Expenses: Very hard  Food Insecurity: Food Insecurity Present (11/19/2023)   Hunger Vital Sign    Worried About Running Out of Food in the Last Year: Often true    Ran Out of Food in the Last Year: Often true  Transportation Needs: Unmet Transportation Needs (11/19/2023)   PRAPARE - Administrator, Civil Service (Medical): Yes    Lack of Transportation (Non-Medical): Yes  Physical Activity: Sufficiently Active (01/16/2023)   Received from [facility]   Exercise Vital Sign    On average, how many days per week do you engage in moderate to strenuous exercise (like a brisk walk)?: 7 days    On average, how many minutes do you engage in exercise at this level?: 150+ min  Stress: Not on file  Social Connections: Socially Isolated (01/16/2023)   Received from [facility]   Social Connection and Isolation Panel    In a typical week, how many times do you talk on the phone with family, friends, or neighbors?: More than three times a week    How often do you get together with friends or relatives?: Never    How often do you attend church or religious services?: Never    Do you belong to any clubs or  organizations such as church groups, unions, fraternal or athletic groups, or school groups?: No    How often do you attend meetings of the clubs or organizations you belong to?: Never    Are you married, widowed, divorced, separated, never married, or living with a partner?: Separated    Family History  Problem Relation Age of Onset  Cancer Father        stomach and colon cancer    Review of Systems:  Review of Systems  Constitutional:  Negative for chills, fever and weight loss.  HENT:  Negative for tinnitus.   Respiratory:  Negative for cough, sputum production and shortness of breath.   Cardiovascular:  Negative for chest pain, palpitations and leg swelling.  Gastrointestinal:  Positive for nausea. Negative for abdominal pain, constipation, diarrhea and heartburn.  Genitourinary:  Negative for dysuria, frequency and urgency.  Musculoskeletal:  Negative for back pain, falls, joint pain and myalgias.  Skin: Negative.   Neurological:  Negative for dizziness and headaches.  Psychiatric/Behavioral:  Negative for depression and memory loss. The patient is nervous/anxious. The patient does not have insomnia.      Allergies as of 07/06/2024       Reactions   Hydrocodone -acetaminophen  Itching   Hydromorphone Hcl Itching   Oxycodone -acetaminophen  Itching        Medication List        Accurate as of July 06, 2024 12:07 PM. If you have any questions, ask your nurse or doctor.          Acetaminophen  Extra Strength 500 MG Tabs Take 2 tablets (1,000 mg total) by mouth every 8 (eight) hours as needed.   albuterol  108 (90 Base) MCG/ACT inhaler Commonly known as: Ventolin  HFA Inhale 2 puffs into the lungs every 6 (six) hours as needed for wheezing or shortness of breath.   fluticasone  50 MCG/ACT nasal spray Commonly known as: FLONASE  Place 1 spray into both nostrils daily.   guaiFENesin  600 MG 12 hr tablet Commonly known as: MUCINEX  Take 1 tablet (600 mg total) by  mouth 2 (two) times daily.   levothyroxine  25 MCG tablet Commonly known as: SYNTHROID  Take 1 tablet (25 mcg total) by mouth daily before breakfast.   loratadine  10 MG tablet Commonly known as: CLARITIN  Take 1 tablet (10 mg total) by mouth daily.   pantoprazole  40 MG tablet Commonly known as: PROTONIX  Take 1 tablet (40 mg total) by mouth daily.   sertraline  50 MG tablet Commonly known as: Zoloft  Take 1 tablet (50 mg total) by mouth daily.   Symbicort  160-4.5 MCG/ACT inhaler Generic drug: budesonide -formoterol  Inhale 2 puffs into the lungs in the morning and at bedtime.          Physical Exam: Vitals:   07/06/24 1204  BP: 110/78  Pulse: 72  Temp: 97.8 F (36.6 C)  SpO2: 94%  Weight: 108 lb 12.8 oz (49.4 kg)  Height: 5' 5 (1.651 m)   Body mass index is 18.11 kg/m. Wt Readings from Last 3 Encounters:  07/06/24 108 lb 12.8 oz (49.4 kg)  02/07/24 103 lb 9.6 oz (47 kg)  12/16/23 106 lb (48.1 kg)    Physical Exam Constitutional:      General: She is not in acute distress.    Appearance: She is well-developed. She is not diaphoretic.  HENT:     Head: Normocephalic and atraumatic.     Mouth/Throat:     Pharynx: No oropharyngeal exudate.  Eyes:     Conjunctiva/sclera: Conjunctivae normal.     Pupils: Pupils are equal, round, and reactive to light.  Cardiovascular:     Rate and Rhythm: Normal rate and regular rhythm.     Heart sounds: Normal heart sounds.  Pulmonary:     Effort: Pulmonary effort is normal.     Breath sounds: Normal breath sounds.  Abdominal:     General:  Bowel sounds are normal.     Palpations: Abdomen is soft.  Musculoskeletal:     Cervical back: Normal range of motion and neck supple.     Right lower leg: No edema.     Left lower leg: No edema.  Skin:    General: Skin is warm and dry.  Neurological:     Mental Status: She is alert.     Motor: Weakness present.     Gait: Gait abnormal.  Psychiatric:        Mood and Affect: Mood  normal.     Labs reviewed: Basic Metabolic Panel: Recent Labs    08/09/23 0327 08/11/23 0327 08/14/23 0401 08/15/23 0404 08/16/23 0338 09/02/23 2238 09/15/23 2250 09/16/23 0319 10/15/23 2017 10/15/23 2025 11/08/23 1416 12/16/23 1518  NA 137 137 137 135 134*   < >  --    < > 134* 135 141 142  K 3.8 3.9 4.1 4.1 4.1   < >  --    < > 3.5 3.6 4.2 4.6  CL 100 100 100 97* 97*   < >  --    < > 97* 96* 102 103  CO2 29 28 27 30 28    < >  --    < > 27  --  31 32  GLUCOSE 108* 119* 78 92 98   < >  --    < > 98 92 93 114  BUN 15 17 26* 24* 23*   < >  --    < > 8 5* 8 7  CREATININE 0.42* 0.61 0.45 0.47 0.51   < >  --    < > 0.47 0.60 0.52 0.53  CALCIUM 8.4* 8.9 8.9 8.7* 8.5*   < >  --    < > 8.7*  --  10.0 9.4  MG 2.5* 2.2 2.3  --  2.1  --   --   --   --   --   --   --   PHOS 3.4  --  4.3  --  4.5  --   --   --   --   --   --   --   TSH  --   --   --  17.644*  --   --  1.307  --   --   --   --   --    < > = values in this interval not displayed.   Liver Function Tests: Recent Labs    09/02/23 2238 09/16/23 0319 10/15/23 2017 11/08/23 1416 12/16/23 1518  AST 18 24 21  54* 17  ALT 17 18 18  80* 14  ALKPHOS 92 75 76  --   --   BILITOT 0.7 0.5 0.7 0.7 0.6  PROT 7.1 5.8* 7.0 6.7 6.4  ALBUMIN 4.1 3.3* 3.9  --   --    Recent Labs    07/31/23 1531 08/01/23 2116 09/02/23 2238  LIPASE 29 34 36   No results for input(s): AMMONIA in the last 8760 hours. CBC: Recent Labs    09/15/23 1839 09/16/23 0319 10/15/23 2017 10/15/23 2025 10/16/23 1317 11/08/23 1416  WBC 4.2   < > 14.3*  --  10.4 7.8  NEUTROABS 3.0  --  11.3*  --   --  5,530  HGB 13.5   < > 14.1 16.0* 12.2 15.1  HCT 42.8   < > 43.8 47.0* 39.3 44.8  MCV 100.5*   < > 100.2*  --  102.3*  96.1  PLT 329   < > 294  --  272 339   < > = values in this interval not displayed.   Lipid Panel: No results for input(s): CHOL, HDL, LDLCALC, TRIG, CHOLHDL, LDLDIRECT in the last 8760 hours. Lab Results  Component  Value Date   HGBA1C 5.5 11/08/2023    Procedures: No results found.  Assessment/Plan 1. Flu vaccine need (Primary) - Flu vaccine trivalent PF, 6mos and older(Flulaval,Afluria,Fluarix,Fluzone)  2. Smoker -encouraged cessation  - Ambulatory Referral for Lung Cancer Screening [REF832] - CBC with Differential/Platelet - CMP  3. Screening mammogram for breast cancer - MM DIGITAL SCREENING BILATERAL; Future  4. Encounter for colorectal cancer screening - Ambulatory referral to Gastroenterology  5. Hyperglycemia - CBC with Differential/Platelet - CMP - Hemoglobin A1c  6. Screening cholesterol level - Lipid Panel  7. Anxiety Improved on zoloft  50 mg daily Continue medication with lifestyle modifications - CMP  8. Acquired hypothyroidism Continues on synthroid   - TSH  9. Preventative health care -wellness visit completed today, The patient was counseled regarding the appropriate use of alcohol, regular self-examination of the breasts on a monthly basis, prevention of dental and periodontal disease, diet, regular sustained exercise for at least 30 minutes 5 times per week, routine screening interval for mammogram as recommended by the American Cancer Society and ACOG, importance of regular PAP smears, tobacco use, and recommended schedule for GI hemoccult testing, colonoscopy, cholesterol, thyroid  and diabetes screening. - Ambulatory referral to Ophthalmology - Ambulatory referral to Dentistry   Next appt: 3 months.  Theotis Gerdeman K. Caro BODILY  [facility] [phone]

## 2024-07-06 NOTE — Patient Instructions (Addendum)
 To get Shingles vaccine at local pharmacy  Order placed for GI referral for screening colonoscopy Order placed for eye doctor Order placed for CT lungs    Please set up your voicemail so if they call they can leave you a msg

## 2024-07-07 ENCOUNTER — Ambulatory Visit: Payer: Self-pay | Admitting: Nurse Practitioner

## 2024-07-07 ENCOUNTER — Other Ambulatory Visit: Payer: Self-pay

## 2024-07-07 DIAGNOSIS — E039 Hypothyroidism, unspecified: Secondary | ICD-10-CM

## 2024-07-07 LAB — CBC WITH DIFFERENTIAL/PLATELET
Absolute Lymphocytes: 1905 {cells}/uL (ref 850–3900)
Absolute Monocytes: 570 {cells}/uL (ref 200–950)
Basophils Absolute: 62 {cells}/uL (ref 0–200)
Basophils Relative: 0.7 %
Eosinophils Absolute: 134 {cells}/uL (ref 15–500)
Eosinophils Relative: 1.5 %
HCT: 46.2 % — ABNORMAL HIGH (ref 35.0–45.0)
Hemoglobin: 15.3 g/dL (ref 11.7–15.5)
MCH: 32.8 pg (ref 27.0–33.0)
MCHC: 33.1 g/dL (ref 32.0–36.0)
MCV: 98.9 fL (ref 80.0–100.0)
MPV: 10.6 fL (ref 7.5–12.5)
Monocytes Relative: 6.4 %
Neutro Abs: 6230 {cells}/uL (ref 1500–7800)
Neutrophils Relative %: 70 %
Platelets: 306 Thousand/uL (ref 140–400)
RBC: 4.67 Million/uL (ref 3.80–5.10)
RDW: 12 % (ref 11.0–15.0)
Total Lymphocyte: 21.4 %
WBC: 8.9 Thousand/uL (ref 3.8–10.8)

## 2024-07-07 LAB — COMPREHENSIVE METABOLIC PANEL WITH GFR
AG Ratio: 2.3 (calc) (ref 1.0–2.5)
ALT: 13 U/L (ref 6–29)
AST: 19 U/L (ref 10–35)
Albumin: 4.5 g/dL (ref 3.6–5.1)
Alkaline phosphatase (APISO): 99 U/L (ref 37–153)
BUN/Creatinine Ratio: 29 (calc) — ABNORMAL HIGH (ref 6–22)
BUN: 14 mg/dL (ref 7–25)
CO2: 30 mmol/L (ref 20–32)
Calcium: 9.5 mg/dL (ref 8.6–10.4)
Chloride: 103 mmol/L (ref 98–110)
Creat: 0.48 mg/dL — ABNORMAL LOW (ref 0.50–1.03)
Globulin: 2 g/dL (ref 1.9–3.7)
Glucose, Bld: 101 mg/dL (ref 65–139)
Potassium: 4.3 mmol/L (ref 3.5–5.3)
Sodium: 140 mmol/L (ref 135–146)
Total Bilirubin: 0.4 mg/dL (ref 0.2–1.2)
Total Protein: 6.5 g/dL (ref 6.1–8.1)
eGFR: 112 mL/min/1.73m2 (ref >=60)

## 2024-07-07 LAB — LIPID PANEL
Cholesterol: 158 mg/dL (ref <200)
HDL: 81 mg/dL (ref >=50)
LDL Cholesterol (Calc): 64 mg/dL
Non-HDL Cholesterol (Calc): 77 mg/dL (ref <130)
Total CHOL/HDL Ratio: 2 (calc) (ref <5.0)
Triglycerides: 58 mg/dL (ref <150)

## 2024-07-07 LAB — HEMOGLOBIN A1C
Hgb A1c MFr Bld: 5.5 % (ref <5.7)
Mean Plasma Glucose: 111 mg/dL
eAG (mmol/L): 6.2 mmol/L

## 2024-07-07 LAB — TSH: TSH: 13.37 m[IU]/L — ABNORMAL HIGH

## 2024-07-08 ENCOUNTER — Other Ambulatory Visit: Payer: Self-pay

## 2024-07-09 ENCOUNTER — Other Ambulatory Visit: Payer: Self-pay | Admitting: Nurse Practitioner

## 2024-07-09 ENCOUNTER — Other Ambulatory Visit (HOSPITAL_COMMUNITY): Payer: Self-pay

## 2024-07-09 ENCOUNTER — Telehealth: Payer: Self-pay

## 2024-07-09 ENCOUNTER — Other Ambulatory Visit: Payer: Self-pay

## 2024-07-09 MED ORDER — LEVOTHYROXINE SODIUM 25 MCG PO TABS
25.0000 ug | ORAL_TABLET | Freq: Every day | ORAL | 1 refills | Status: AC
  Filled 2024-07-09: qty 60, 60d supply, fill #0
  Filled 2024-09-03: qty 60, 60d supply, fill #1

## 2024-07-09 NOTE — Telephone Encounter (Signed)
 [facility] called [phone],  stating they received records on patient and it appears that they were suppose to go to [facility]. The receptionist from [facility] stated the phone and address on the fax is the information for [facility], and she described that as Weird,   Referral will need to be reworked

## 2024-07-10 NOTE — Telephone Encounter (Signed)
 Epic has the old owner name, NPI, address, ph, fax. I have corrected.

## 2024-07-13 NOTE — Telephone Encounter (Signed)
 Thank you for input. Please take daily on empty stomach about 45 minutes before other medication or foods. Please schedule lab visit to recheck levels in 4-6 weeks.

## 2024-07-13 NOTE — Telephone Encounter (Signed)
 Called [facility].

## 2024-07-26 ENCOUNTER — Emergency Department (HOSPITAL_COMMUNITY): Admission: EM | Admit: 2024-07-26 | Discharge: 2024-07-26 | Disposition: A | Discharge status: Home / Self Care

## 2024-07-26 ENCOUNTER — Encounter (HOSPITAL_COMMUNITY): Payer: Self-pay

## 2024-07-26 ENCOUNTER — Other Ambulatory Visit: Payer: Self-pay

## 2024-07-26 ENCOUNTER — Emergency Department (HOSPITAL_COMMUNITY)

## 2024-07-26 DIAGNOSIS — M25531 Pain in right wrist: Secondary | ICD-10-CM | POA: Diagnosis present

## 2024-07-26 DIAGNOSIS — S52591A Other fractures of lower end of right radius, initial encounter for closed fracture: Secondary | ICD-10-CM | POA: Insufficient documentation

## 2024-07-26 DIAGNOSIS — S52501A Unspecified fracture of the lower end of right radius, initial encounter for closed fracture: Secondary | ICD-10-CM

## 2024-07-26 DIAGNOSIS — W010XXA Fall on same level from slipping, tripping and stumbling without subsequent striking against object, initial encounter: Secondary | ICD-10-CM | POA: Diagnosis not present

## 2024-07-26 DIAGNOSIS — M25551 Pain in right hip: Secondary | ICD-10-CM | POA: Insufficient documentation

## 2024-07-26 MED ORDER — KETOROLAC TROMETHAMINE 15 MG/ML IJ SOLN
15.0000 mg | Freq: Once | INTRAMUSCULAR | Status: AC
  Administered 2024-07-26: 15 mg via INTRAMUSCULAR
  Filled 2024-07-26: qty 1

## 2024-07-26 MED ORDER — TRAMADOL HCL 50 MG PO TABS
50.0000 mg | ORAL_TABLET | Freq: Once | ORAL | Status: AC
  Administered 2024-07-26: 50 mg via ORAL
  Filled 2024-07-26: qty 1

## 2024-07-26 MED ORDER — TRAMADOL HCL 50 MG PO TABS
50.0000 mg | ORAL_TABLET | Freq: Four times a day (QID) | ORAL | 0 refills | Status: AC | PRN
  Filled 2024-07-26 – 2024-08-04 (×2): qty 10, 3d supply, fill #0

## 2024-07-26 MED ORDER — IBUPROFEN 800 MG PO TABS
800.0000 mg | ORAL_TABLET | Freq: Three times a day (TID) | ORAL | 0 refills | Status: AC
  Filled 2024-07-26 – 2024-08-04 (×2): qty 21, 7d supply, fill #0

## 2024-07-26 MED ORDER — ACETAMINOPHEN 500 MG PO TABS
500.0000 mg | ORAL_TABLET | Freq: Four times a day (QID) | ORAL | 0 refills | Status: AC | PRN
  Filled 2024-07-26 – 2024-08-04 (×2): qty 30, 8d supply, fill #0

## 2024-07-26 NOTE — ED Triage Notes (Addendum)
 Pt arrived via POV with friend from home for a fall. Pt stated she tripped and fell injuring her right wrist and right hip. Wrist pain 10/10 with numbness in finger. Hip 8/10 pain

## 2024-07-26 NOTE — Progress Notes (Signed)
 Orthopedic Tech Progress Note Patient Details:  Stacy Bolton [birth year] [id number]  Ortho Devices Type of Ortho Device: Arm sling, Sugartong splint Ortho Device/Splint Location: rue Ortho Device/Splint Interventions: Ordered, Adjustment, Application   Post Interventions Patient Tolerated: Well Instructions Provided: Care of device, Adjustment of device  Chandra Dorn PARAS 07/26/2024, 11:07 PM

## 2024-07-26 NOTE — Discharge Instructions (Addendum)
 Today you were seen after a fall.  Your imaging revealed a right radial fracture.  Please wear your splint as directed and follow-up with Dr. Alyse with orthopedic surgery for further evaluation and workup.  You may alternate Tylenol  and Motrin  as needed for pain.  You have also been given a short course of tramadol  outpatient for severe pain.  Thank you for letting us  treat you today. After reviewing your labs and imaging, I feel you are safe to go home. Please follow up with your PCP in the next several days and provide them with your records from this visit. Return to the Emergency Room if pain becomes severe or symptoms worsen.

## 2024-07-26 NOTE — ED Provider Notes (Signed)
 [place] EMERGENCY DEPARTMENT AT [facility] Provider Note   CSN: [id number] Arrival date & time: 07/26/24  8062     Patient presents with: Wrist Pain, Fall, and Hip Pain   Stacy Bolton is a 53 y.o. female presents today for a mechanical fall.  Patient reports right wrist/hand pain and right hip pain.  Patient denies head injury, LOC, any other injuries at this time, or blood thinner use.    Wrist Pain  Fall  Hip Pain       Prior to Admission medications   Medication Sig Start Date End Date Taking? Authorizing Provider  acetaminophen  (TYLENOL ) 500 MG tablet Take 1 tablet (500 mg total) by mouth every 6 (six) hours as needed. 07/26/24  Yes Shanigua Gibb N, PA-C  albuterol  (VENTOLIN  HFA) 108 (90 Base) MCG/ACT inhaler Inhale 2 puffs into the lungs every 6 (six) hours as needed for wheezing or shortness of breath. 06/25/24  Yes Caro Harlene POUR, NP  budesonide -formoterol  (SYMBICORT ) 160-4.5 MCG/ACT inhaler Inhale 2 puffs into the lungs in the morning and at bedtime. 08/17/23  Yes Gonfa, Taye T, MD  fluticasone  (FLONASE ) 50 MCG/ACT nasal spray Place 1 spray into both nostrils daily. 04/24/24  Yes Caro Harlene POUR, NP  ibuprofen  (ADVIL ) 800 MG tablet Take 1 tablet (800 mg total) by mouth 3 (three) times daily. 07/26/24  Yes Layza Summa N, PA-C  levothyroxine  (SYNTHROID ) 25 MCG tablet Take 1 tablet (25 mcg total) by mouth daily before breakfast. 07/09/24  Yes Eubanks, Jessica K, NP  loratadine  (CLARITIN ) 10 MG tablet Take 1 tablet (10 mg total) by mouth daily. 04/24/24  Yes Caro Harlene POUR, NP  pantoprazole  (PROTONIX ) 40 MG tablet Take 1 tablet (40 mg total) by mouth daily. 03/06/24  Yes Caro Harlene POUR, NP  sertraline  (ZOLOFT ) 50 MG tablet Take 1 tablet (50 mg total) by mouth daily. 06/25/24 09/23/24 Yes Caro Harlene POUR, NP  traMADol  (ULTRAM ) 50 MG tablet Take 1 tablet (50 mg total) by mouth every 6 (six) hours as needed for severe pain (pain score  7-10). 07/26/24  Yes Carleton Vanvalkenburgh N, PA-C  guaiFENesin  (MUCINEX ) 600 MG 12 hr tablet Take 1 tablet (600 mg total) by mouth 2 (two) times daily. 07/06/24   Caro Harlene POUR, NP    Allergies: Hydrocodone -acetaminophen , Hydromorphone hcl, and Oxycodone -acetaminophen     Review of Systems  Musculoskeletal:  Positive for arthralgias.    Updated Vital Signs BP (!) 138/95 (BP Location: Left Arm)   Pulse 91   Temp 98.3 F (36.8 C) (Oral)   Resp 18   Ht 5' 5 (1.651 m)   Wt 49.4 kg   LMP 10/13/2011   SpO2 95%   BMI 18.14 kg/m   Physical Exam Vitals and nursing note reviewed.  Constitutional:      General: She is not in acute distress.    Appearance: She is well-developed.  HENT:     Head: Normocephalic and atraumatic.  Eyes:     Conjunctiva/sclera: Conjunctivae normal.  Cardiovascular:     Rate and Rhythm: Normal rate and regular rhythm.     Heart sounds: No murmur heard. Pulmonary:     Effort: Pulmonary effort is normal. No respiratory distress.     Breath sounds: Normal breath sounds.  Abdominal:     Palpations: Abdomen is soft.     Tenderness: There is no abdominal tenderness.  Musculoskeletal:        General: Swelling, tenderness and signs of injury present.  Cervical back: Neck supple.     Comments: Patient reports tenderness to palpation of the posterior right hip.  No obvious deformity on exam.  Skin:    General: Skin is warm and dry.     Capillary Refill: Capillary refill takes less than 2 seconds.     Findings: Bruising present.     Comments: Patient with mild ecchymosis and swelling noted to the distal right radius.  Patient also reports pain and swelling in the her distal right 2nd through 4th phalanxes.  Patient is neurovascularly intact with +2 radial pulses.  ROM limited secondary to pain.  Neurological:     General: No focal deficit present.     Mental Status: She is alert.  Psychiatric:        Mood and Affect: Mood normal.     (all labs ordered are  listed, but only abnormal results are displayed) Labs Reviewed - No data to display  EKG: None  Radiology: CT Hip Right Wo Contrast Result Date: 07/26/2024 EXAM: CT OF THE RIGHT HIP WITHOUT IV CONTRAST 07/26/2024 09:02:19 PM TECHNIQUE: CT of the right hip was performed without the administration of intravenous contrast. Multiplanar reformatted images are provided for review. Automated exposure control, iterative reconstruction, and/or weight based adjustment of the mA/kV was utilized to reduce the radiation dose to as low as reasonably achievable. COMPARISON: None available. CLINICAL HISTORY: Hip trauma, fracture suspected, xray done. FINDINGS: BONES: No acute fracture or dislocation. The abnormality seen on same day radiograph was due to confluence of shadows from advanced degenerative arthritis of the right hip. No aggressive appearing osseous abnormality or periostitis. SOFT TISSUE: Fat stranding posterior to the right greater trochanter compatible with contusion. No soft tissue mass. JOINT: Advanced degenerative arthritis of the right hip. No osseous erosions. INTRAPELVIC CONTENTS: Limited images of the intrapelvic contents are unremarkable. IMPRESSION: 1. No acute fracture. 2. Fat stranding posterior to the right greater trochanter compatible with contusion. Electronically signed by: Norman Gatlin MD 07/26/2024 09:35 PM EST RP Workstation: [id number]   DG Hand Complete Right Result Date: 07/26/2024 EXAM: 3 OR MORE VIEW(S) XRAY OF THE HAND 07/26/2024 08:28:00 PM COMPARISON: Same-day wrist radiographs. CLINICAL HISTORY: fall FINDINGS: BONES AND JOINTS: Acute nondisplaced distal radial fracture. No focal osseous lesion. No joint dislocation. First metacarpophalangeal joint degenerative changes. Distal interphalangeal joints degenerative changes. SOFT TISSUES: The soft tissues are unremarkable. IMPRESSION: 1. Acute nondisplaced distal radial fracture. Electronically signed by: Norman Gatlin MD  07/26/2024 08:41 PM EST RP Workstation: [id number]   DG Wrist Complete Right Result Date: 07/26/2024 CLINICAL DATA:  Fall EXAM: RIGHT WRIST - COMPLETE 3+ VIEW COMPARISON:  None Available. FINDINGS: There is an acute oblique nondisplaced fracture of the distal radius with intra-articular extension. There is surrounding soft tissue swelling. There is no dislocation. IMPRESSION: Acute oblique nondisplaced fracture of the distal radius. Electronically Signed   By: Greig Pique M.D.   On: 07/26/2024 20:23   DG Hip Unilat W or Wo Pelvis 2-3 Views Right Result Date: 07/26/2024 EXAM: 2 OR MORE VIEW(S) XRAY OF THE UNILATERAL HIP 07/26/2024 08:11:00 PM COMPARISON: None available. CLINICAL HISTORY: fall FINDINGS: BONES AND JOINTS: Cervical linear lucency through the right femoral head suspicious for nondisplaced fracture. Advanced arthritis of both hips. The hip joint is maintained. SOFT TISSUES: Large colonic stool burden. IMPRESSION: 1. Cervical linear lucency through the right femoral head suspicious for nondisplaced fracture. CT is recommended for further evaluation. 2. Advanced arthritis of both hips. Electronically signed by: Norman Gatlin MD 07/26/2024 08:16  PM EST RP Workstation: [id number]     Procedures   Medications Ordered in the ED  ketorolac  (TORADOL ) 15 MG/ML injection 15 mg (15 mg Intramuscular Given 07/26/24 2037)                                    Medical Decision Making Amount and/or Complexity of Data Reviewed Radiology: ordered.  Risk OTC drugs. Prescription drug management.   This patient presents to the ED for concern of fall differential diagnosis includes fracture, dislocation, musculoskeletal pain    Additional history obtained   Additional history obtained from Electronic Medical Record External records from outside source obtained and reviewed including previous admissions   Imaging Studies ordered:  I ordered imaging studies including right wrist  x-ray I independently visualized and interpreted imaging which showed acute oblique nondisplaced fracture of the distal radius I agree with the radiologist interpretation Right hip x-ray cervical linear lucency through the right femoral head suspicious for nondisplaced fracture.  CT is recommended for further evaluation CT right hip Noncon no acute fracture, fat stranding posterior to the right greater trochanter compatible with contusion. Right hand x-ray which showed acute nondisplaced distal radial fracture   Medicines ordered and prescription drug management:  I ordered medication including Toradol     I have reviewed the patients home medicines and have made adjustments as needed   Problem List / ED Course:  Patient splinted and placed in sling. Considered for admission or further workup however patient's vital signs, physical exam, and imaging are reassuring.  Patient's symptoms likely due to right distal radius fracture.  Patient advised to take Tylenol  Motrin  for mild to moderate pain and given short course of tramadol  for severe pain.  Patient to follow-up with hand surgery for further evaluation workup.  I feel patient safe for discharge at this time.        Final diagnoses:  Closed fracture of distal end of right radius, unspecified fracture morphology, initial encounter    ED Discharge Orders          Ordered    traMADol  (ULTRAM ) 50 MG tablet  Every 6 hours PRN        07/26/24 2148    acetaminophen  (TYLENOL ) 500 MG tablet  Every 6 hours PRN        07/26/24 2219    ibuprofen  (ADVIL ) 800 MG tablet  3 times daily        07/26/24 2219               Francis Ileana SAILOR, PA-C 07/26/24 2224    Neysa Caron PARAS, DO 07/26/24 2347

## 2024-07-27 ENCOUNTER — Other Ambulatory Visit: Payer: Self-pay

## 2024-07-27 ENCOUNTER — Other Ambulatory Visit (HOSPITAL_COMMUNITY): Payer: Self-pay

## 2024-07-28 ENCOUNTER — Other Ambulatory Visit: Payer: Self-pay

## 2024-07-31 ENCOUNTER — Other Ambulatory Visit: Payer: Self-pay | Admitting: Orthopedic Surgery

## 2024-07-31 ENCOUNTER — Telehealth: Payer: Self-pay

## 2024-07-31 ENCOUNTER — Other Ambulatory Visit: Payer: Self-pay

## 2024-07-31 DIAGNOSIS — Z5900 Homelessness unspecified: Secondary | ICD-10-CM

## 2024-07-31 NOTE — Telephone Encounter (Signed)
 Copied from CRM [id number]. Topic: Referral - Question >> Jul 31, 2024  1:14 PM DeAngela L wrote: Reason for CRM: patient is asking if the provider can send a referral to Mr Andra at [facility] for her Medical referral for the patients disability   Leonor Andra [phone] ext # [phone] at [facility]  Pt num [phone] (H)

## 2024-07-31 NOTE — Telephone Encounter (Signed)
 Copied from CRM [id number]. Topic: Referral - Question >> Jul 31, 2024  1:18 PM Stacy Bolton wrote: Reason for CRM: patient asking if the provider can send a referral for the patient to the [facility]  The patient is asking if she could get a case production designer, theatre/television/film or someone to help her complete this   [facility] Address: [address] [place], [place] [address] Phone: [phone]   Then someone at [facility] will help the patient complete the application and then it will be sent to   This is to help the patient fill out the application since she has 2 broken wrist since she recently broke her right wrist   Ms Stacy Bolton [phone] [employer] #6 and then #3   Pt num [phone] (H)

## 2024-07-31 NOTE — Telephone Encounter (Signed)
 [employer] referral for social work assistance completed. Social worker should call to help with concerns listed. Contact information was also listed in referral for social worker.

## 2024-08-04 ENCOUNTER — Ambulatory Visit

## 2024-08-04 ENCOUNTER — Other Ambulatory Visit (HOSPITAL_COMMUNITY): Payer: Self-pay

## 2024-08-04 ENCOUNTER — Other Ambulatory Visit: Payer: Self-pay

## 2024-08-05 ENCOUNTER — Other Ambulatory Visit: Payer: Self-pay

## 2024-08-10 ENCOUNTER — Other Ambulatory Visit: Payer: Self-pay

## 2024-08-24 NOTE — Telephone Encounter (Signed)
 Please advise...  Copied from CRM #[id number]. Topic: General - Other >> Aug 24, 2024  1:09 PM Stacy Bolton wrote: Reason for CRM: Patient is in need of care management services, and possible financial services. Patient was recently sent to a different office for management services but they do not cover our offices so they are asking if [facility] offers any of these services. Please update the patient if [facility] offers anything.

## 2024-09-03 ENCOUNTER — Telehealth: Payer: Self-pay

## 2024-09-03 ENCOUNTER — Other Ambulatory Visit: Payer: Self-pay

## 2024-09-03 ENCOUNTER — Other Ambulatory Visit (HOSPITAL_COMMUNITY): Payer: Self-pay

## 2024-09-03 NOTE — Progress Notes (Unsigned)
 Complex Care Management Note Care Guide Note  09/03/2024 Name: Stacy Bolton MRN: [id number] DOB: 1970/08/28   Complex Care Management Outreach Attempts: An unsuccessful telephone outreach was attempted today to offer the patient information about available complex care management services.  Follow Up Plan:  Additional outreach attempts will be made to offer the patient complex care management information and services.   Encounter Outcome:  No Answer  Debbe Fuse [facility], [facility] Guide  Direct Dial: [phone]  Fax [phone]

## 2024-09-04 ENCOUNTER — Other Ambulatory Visit (HOSPITAL_COMMUNITY): Payer: Self-pay

## 2024-09-04 ENCOUNTER — Other Ambulatory Visit: Payer: Self-pay

## 2024-09-07 NOTE — Progress Notes (Unsigned)
 Complex Care Management Note Care Guide Note  09/07/2024 Name: Stacy Bolton MRN: [id number] DOB: December 12, 1970   Complex Care Management Outreach Attempts: A second unsuccessful outreach was attempted today to offer the patient with information about available complex care management services.  Follow Up Plan:  Additional outreach attempts will be made to offer the patient complex care management information and services.   Encounter Outcome:  No Answer  Debbe Fuse [facility], [facility] Guide  Direct Dial: [phone]  Fax [phone]

## 2024-09-09 NOTE — Progress Notes (Signed)
 Complex Care Management Note Care Guide Note  09/09/2024 Name: Stacy Bolton MRN: [id number] DOB: [birth year]   Complex Care Management Outreach Attempts: A third unsuccessful outreach was attempted today to offer the patient with information about available complex care management services.  Follow Up Plan:  No further outreach attempts will be made at this time. We have been unable to contact the patient to offer or enroll patient in complex care management services.  Encounter Outcome:  No Answer  .cfs

## 2024-10-05 ENCOUNTER — Ambulatory Visit: Admitting: Nurse Practitioner
